# Patient Record
Sex: Male | Born: 1965 | Race: Black or African American | Hispanic: No | Marital: Single | Smoking: Former smoker
Health system: Southern US, Community
[De-identification: ages and names within clinical notes are randomized; demographics above are authoritative.]

## PROBLEM LIST (undated history)

## (undated) DIAGNOSIS — I350 Nonrheumatic aortic (valve) stenosis: Secondary | ICD-10-CM

## (undated) DIAGNOSIS — I10 Essential (primary) hypertension: Secondary | ICD-10-CM

## (undated) DIAGNOSIS — I050 Rheumatic mitral stenosis: Secondary | ICD-10-CM

## (undated) DIAGNOSIS — Z973 Presence of spectacles and contact lenses: Secondary | ICD-10-CM

## (undated) DIAGNOSIS — Z972 Presence of dental prosthetic device (complete) (partial): Secondary | ICD-10-CM

## (undated) DIAGNOSIS — J189 Pneumonia, unspecified organism: Secondary | ICD-10-CM

## (undated) DIAGNOSIS — I052 Rheumatic mitral stenosis with insufficiency: Secondary | ICD-10-CM

## (undated) DIAGNOSIS — I340 Nonrheumatic mitral (valve) insufficiency: Secondary | ICD-10-CM

## (undated) HISTORY — DX: Nonrheumatic aortic (valve) stenosis: I35.0

## (undated) HISTORY — DX: Rheumatic mitral stenosis: I05.0

## (undated) HISTORY — DX: Essential (primary) hypertension: I10

## (undated) HISTORY — PX: BILATERAL KNEE ARTHROSCOPY: SUR91

## (undated) HISTORY — DX: Rheumatic mitral stenosis with insufficiency: I05.2

## (undated) HISTORY — DX: Nonrheumatic mitral (valve) insufficiency: I34.0

---

## 1999-11-30 ENCOUNTER — Emergency Department (HOSPITAL_COMMUNITY): Admission: EM | Admit: 1999-11-30 | Discharge: 1999-11-30 | Payer: Self-pay | Admitting: Emergency Medicine

## 2000-01-04 ENCOUNTER — Emergency Department (HOSPITAL_COMMUNITY): Admission: EM | Admit: 2000-01-04 | Discharge: 2000-01-05 | Payer: Self-pay | Admitting: Emergency Medicine

## 2004-07-24 ENCOUNTER — Emergency Department (HOSPITAL_COMMUNITY): Admission: EM | Admit: 2004-07-24 | Discharge: 2004-07-24 | Payer: Self-pay | Admitting: *Deleted

## 2005-06-01 ENCOUNTER — Ambulatory Visit: Payer: Self-pay | Admitting: Oncology

## 2005-11-29 ENCOUNTER — Ambulatory Visit: Payer: Self-pay | Admitting: Oncology

## 2006-04-03 ENCOUNTER — Ambulatory Visit: Payer: Self-pay | Admitting: Oncology

## 2006-04-05 LAB — IRON AND TIBC
%SAT: 14 % — ABNORMAL LOW (ref 20–55)
Iron: 46 ug/dL (ref 42–165)
TIBC: 318 ug/dL (ref 215–435)
UIBC: 272 ug/dL

## 2006-04-05 LAB — CBC WITH DIFFERENTIAL/PLATELET
BASO%: 0.8 % (ref 0.0–2.0)
Basophils Absolute: 0.1 10*3/uL (ref 0.0–0.1)
EOS%: 2.7 % (ref 0.0–7.0)
Eosinophils Absolute: 0.3 10*3/uL (ref 0.0–0.5)
HCT: 38.6 % — ABNORMAL LOW (ref 38.7–49.9)
HGB: 13 g/dL (ref 13.0–17.1)
LYMPH%: 32.2 % (ref 14.0–48.0)
MCH: 30.1 pg (ref 28.0–33.4)
MCHC: 33.6 g/dL (ref 32.0–35.9)
MCV: 89.5 fL (ref 81.6–98.0)
MONO#: 1.1 10*3/uL — ABNORMAL HIGH (ref 0.1–0.9)
MONO%: 9.7 % (ref 0.0–13.0)
NEUT#: 6.2 10*3/uL (ref 1.5–6.5)
NEUT%: 54.6 % (ref 40.0–75.0)
Platelets: 222 10*3/uL (ref 145–400)
RBC: 4.32 10*6/uL (ref 4.20–5.71)
RDW: 12 % (ref 11.2–14.6)
WBC: 11.3 10*3/uL — ABNORMAL HIGH (ref 4.0–10.0)
lymph#: 3.6 10*3/uL — ABNORMAL HIGH (ref 0.9–3.3)

## 2006-04-05 LAB — FERRITIN: Ferritin: 46 ng/mL (ref 22–322)

## 2006-09-18 ENCOUNTER — Ambulatory Visit: Payer: Self-pay | Admitting: Oncology

## 2008-12-16 ENCOUNTER — Encounter: Admission: RE | Admit: 2008-12-16 | Discharge: 2008-12-22 | Payer: Self-pay | Admitting: Family Medicine

## 2013-03-11 ENCOUNTER — Ambulatory Visit: Payer: No Typology Code available for payment source | Attending: Family Medicine | Admitting: Family Medicine

## 2013-03-11 ENCOUNTER — Encounter: Payer: Self-pay | Admitting: Family Medicine

## 2013-03-11 VITALS — BP 207/136 | HR 84 | Temp 97.9°F | Resp 18 | Wt 168.8 lb

## 2013-03-11 DIAGNOSIS — F17200 Nicotine dependence, unspecified, uncomplicated: Secondary | ICD-10-CM

## 2013-03-11 DIAGNOSIS — K0530 Chronic periodontitis, unspecified: Secondary | ICD-10-CM | POA: Insufficient documentation

## 2013-03-11 DIAGNOSIS — Z823 Family history of stroke: Secondary | ICD-10-CM

## 2013-03-11 DIAGNOSIS — I10 Essential (primary) hypertension: Secondary | ICD-10-CM

## 2013-03-11 DIAGNOSIS — K029 Dental caries, unspecified: Secondary | ICD-10-CM

## 2013-03-11 LAB — COMPREHENSIVE METABOLIC PANEL
ALT: 12 U/L (ref 0–53)
AST: 17 U/L (ref 0–37)
Albumin: 4 g/dL (ref 3.5–5.2)
Alkaline Phosphatase: 81 U/L (ref 39–117)
BUN: 12 mg/dL (ref 6–23)
CO2: 23 mEq/L (ref 19–32)
Calcium: 9.7 mg/dL (ref 8.4–10.5)
Chloride: 104 mEq/L (ref 96–112)
Creat: 0.97 mg/dL (ref 0.50–1.35)
Glucose, Bld: 89 mg/dL (ref 70–99)
Potassium: 4 mEq/L (ref 3.5–5.3)
Sodium: 138 mEq/L (ref 135–145)
Total Bilirubin: 0.4 mg/dL (ref 0.3–1.2)
Total Protein: 7.9 g/dL (ref 6.0–8.3)

## 2013-03-11 LAB — HEMOGLOBIN A1C
Hgb A1c MFr Bld: 4.9 % (ref <5.7)
Mean Plasma Glucose: 94 mg/dL (ref <117)

## 2013-03-11 LAB — TSH: TSH: 0.747 u[IU]/mL (ref 0.350–4.500)

## 2013-03-11 LAB — LIPID PANEL
Cholesterol: 137 mg/dL (ref 0–200)
HDL: 42 mg/dL (ref >39)
LDL Cholesterol: 74 mg/dL (ref 0–99)
Total CHOL/HDL Ratio: 3.3 Ratio
Triglycerides: 107 mg/dL (ref <150)
VLDL: 21 mg/dL (ref 0–40)

## 2013-03-11 MED ORDER — ATENOLOL 25 MG PO TABS: 25.0000 mg | ORAL_TABLET | Freq: Every day | ORAL | Status: DC

## 2013-03-11 MED ORDER — LISINOPRIL-HYDROCHLOROTHIAZIDE 20-12.5 MG PO TABS: 1.0000 | ORAL_TABLET | Freq: Every day | ORAL | Status: DC

## 2013-03-11 NOTE — Patient Instructions (Signed)
Smoking Cessation, Tips for Success YOU CAN QUIT SMOKING If you are ready to quit smoking, congratulations! You have chosen to help yourself be healthier. Cigarettes bring nicotine, tar, carbon monoxide, and other irritants into your body. Your lungs, heart, and blood vessels will be able to work better without these poisons. There are many different ways to quit smoking. Nicotine gum, nicotine patches, a nicotine inhaler, or nicotine nasal spray can help with physical craving. Hypnosis, support groups, and medicines help break the habit of smoking. Here are some tips to help you quit for good.  Throw away all cigarettes.  Clean and remove all ashtrays from your home, work, and car.  On a card, write down your reasons for quitting. Carry the card with you and read it when you get the urge to smoke.  Cleanse your body of nicotine. Drink enough water and fluids to keep your urine clear or pale yellow. Do this after quitting to flush the nicotine from your body.  Learn to predict your moods. Do not let a bad situation be your excuse to have a cigarette. Some situations in your life might tempt you into wanting a cigarette.  Never have "just one" cigarette. It leads to wanting another and another. Remind yourself of your decision to quit.  Change habits associated with smoking. If you smoked while driving or when feeling stressed, try other activities to replace smoking. Stand up when drinking your coffee. Brush your teeth after eating. Sit in a different chair when you read the paper. Avoid alcohol while trying to quit, and try to drink fewer caffeinated beverages. Alcohol and caffeine may urge you to smoke.  Avoid foods and drinks that can trigger a desire to smoke, such as sugary or spicy foods and alcohol.  Ask people who smoke not to smoke around you.  Have something planned to do right after eating or having a cup of coffee. Take a walk or exercise to perk you up. This will help to keep you  from overeating.  Try a relaxation exercise to calm you down and decrease your stress. Remember, you may be tense and nervous for the first 2 weeks after you quit, but this will pass.  Find new activities to keep your hands busy. Play with a pen, coin, or rubber band. Doodle or draw things on paper.  Brush your teeth right after eating. This will help cut down on the craving for the taste of tobacco after meals. You can try mouthwash, too.  Use oral substitutes, such as lemon drops, carrots, a cinnamon stick, or chewing gum, in place of cigarettes. Keep them handy so they are available when you have the urge to smoke.  When you have the urge to smoke, try deep breathing.  Designate your home as a nonsmoking area.  If you are a heavy smoker, ask your caregiver about a prescription for nicotine chewing gum. It can ease your withdrawal from nicotine.  Reward yourself. Set aside the cigarette money you save and buy yourself something nice.  Look for support from others. Join a support group or smoking cessation program. Ask someone at home or at work to help you with your plan to quit smoking.  Always ask yourself, "Do I need this cigarette or is this just a reflex?" Tell yourself, "Today, I choose not to smoke," or "I do not want to smoke." You are reminding yourself of your decision to quit, even if you do smoke a cigarette. HOW WILL I FEEL WHEN   I QUIT SMOKING?  The benefits of not smoking start within days of quitting.  You may have symptoms of withdrawal because your body is used to nicotine (the addictive substance in cigarettes). You may crave cigarettes, be irritable, feel very hungry, cough often, get headaches, or have difficulty concentrating.  The withdrawal symptoms are only temporary. They are strongest when you first quit but will go away within 10 to 14 days.  When withdrawal symptoms occur, stay in control. Think about your reasons for quitting. Remind yourself that these are  signs that your body is healing and getting used to being without cigarettes.  Remember that withdrawal symptoms are easier to treat than the major diseases that smoking can cause.  Even after the withdrawal is over, expect periodic urges to smoke. However, these cravings are generally short-lived and will go away whether you smoke or not. Do not smoke!  If you relapse and smoke again, do not lose hope. Most smokers quit 3 times before they are successful.  If you relapse, do not give up! Plan ahead and think about what you will do the next time you get the urge to smoke. LIFE AS A NONSMOKER: MAKE IT FOR A MONTH, MAKE IT FOR LIFE Day 1: Hang this page where you will see it every day. Day 2: Get rid of all ashtrays, matches, and lighters. Day 3: Drink water. Breathe deeply between sips. Day 4: Avoid places with smoke-filled air, such as bars, clubs, or the smoking section of restaurants. Day 5: Keep track of how much money you save by not smoking. Day 6: Avoid boredom. Keep a good book with you or go to the movies. Day 7: Reward yourself! One week without smoking! Day 8: Make a dental appointment to get your teeth cleaned. Day 9: Decide how you will turn down a cigarette before it is offered to you. Day 10: Review your reasons for quitting. Day 11: Distract yourself. Stay active to keep your mind off smoking and to relieve tension. Take a walk, exercise, read a book, do a crossword puzzle, or try a new hobby. Day 12: Exercise. Get off the bus before your stop or use stairs instead of escalators. Day 13: Call on friends for support and encouragement. Day 14: Reward yourself! Two weeks without smoking! Day 15: Practice deep breathing exercises. Day 16: Bet a friend that you can stay a nonsmoker. Day 17: Ask to sit in nonsmoking sections of restaurants. Day 18: Hang up "No Smoking" signs. Day 19: Think of yourself as a nonsmoker. Day 20: Each morning, tell yourself you will not smoke. Day  21: Reward yourself! Three weeks without smoking! Day 22: Think of smoking in negative ways. Remember how it stains your teeth, gives you bad breath, and leaves you short of breath. Day 23: Eat a nutritious breakfast. Day 24:Do not relive your days as a smoker. Day 25: Hold a pencil in your hand when talking on the telephone. Day 26: Tell all your friends you do not smoke. Day 27: Think about how much better food tastes. Day 28: Remember, one cigarette is one too many. Day 29: Take up a hobby that will keep your hands busy. Day 30: Congratulations! One month without smoking! Give yourself a big reward. Your caregiver can direct you to community resources or hospitals for support, which may include:  Group support.  Education.  Hypnosis.  Subliminal therapy. Document Released: 06/24/2004 Document Revised: 12/19/2011 Document Reviewed: 07/13/2009 [facility] Patient Information 2014 [place], [place]. Smoking  Cessation, Tips for Success YOU CAN QUIT SMOKING If you are ready to quit smoking, congratulations! You have chosen to help yourself be healthier. Cigarettes bring nicotine, tar, carbon monoxide, and other irritants into your body. Your lungs, heart, and blood vessels will be able to work better without these poisons. There are many different ways to quit smoking. Nicotine gum, nicotine patches, a nicotine inhaler, or nicotine nasal spray can help with physical craving. Hypnosis, support groups, and medicines help break the habit of smoking. Here are some tips to help you quit for good.  Throw away all cigarettes.  Clean and remove all ashtrays from your home, work, and car.  On a card, write down your reasons for quitting. Carry the card with you and read it when you get the urge to smoke.  Cleanse your body of nicotine. Drink enough water and fluids to keep your urine clear or pale yellow. Do this after quitting to flush the nicotine from your body.  Learn to predict your moods. Do  not let a bad situation be your excuse to have a cigarette. Some situations in your life might tempt you into wanting a cigarette.  Never have "just one" cigarette. It leads to wanting another and another. Remind yourself of your decision to quit.  Change habits associated with smoking. If you smoked while driving or when feeling stressed, try other activities to replace smoking. Stand up when drinking your coffee. Brush your teeth after eating. Sit in a different chair when you read the paper. Avoid alcohol while trying to quit, and try to drink fewer caffeinated beverages. Alcohol and caffeine may urge you to smoke.  Avoid foods and drinks that can trigger a desire to smoke, such as sugary or spicy foods and alcohol.  Ask people who smoke not to smoke around you.  Have something planned to do right after eating or having a cup of coffee. Take a walk or exercise to perk you up. This will help to keep you from overeating.  Try a relaxation exercise to calm you down and decrease your stress. Remember, you may be tense and nervous for the first 2 weeks after you quit, but this will pass.  Find new activities to keep your hands busy. Play with a pen, coin, or rubber band. Doodle or draw things on paper.  Brush your teeth right after eating. This will help cut down on the craving for the taste of tobacco after meals. You can try mouthwash, too.  Use oral substitutes, such as lemon drops, carrots, a cinnamon stick, or chewing gum, in place of cigarettes. Keep them handy so they are available when you have the urge to smoke.  When you have the urge to smoke, try deep breathing.  Designate your home as a nonsmoking area.  If you are a heavy smoker, ask your caregiver about a prescription for nicotine chewing gum. It can ease your withdrawal from nicotine.  Reward yourself. Set aside the cigarette money you save and buy yourself something nice.  Look for support from others. Join a support group  or smoking cessation program. Ask someone at home or at work to help you with your plan to quit smoking.  Always ask yourself, "Do I need this cigarette or is this just a reflex?" Tell yourself, "Today, I choose not to smoke," or "I do not want to smoke." You are reminding yourself of your decision to quit, even if you do smoke a cigarette. HOW WILL I FEEL WHEN  I QUIT SMOKING?  The benefits of not smoking start within days of quitting.  You may have symptoms of withdrawal because your body is used to nicotine (the addictive substance in cigarettes). You may crave cigarettes, be irritable, feel very hungry, cough often, get headaches, or have difficulty concentrating.  The withdrawal symptoms are only temporary. They are strongest when you first quit but will go away within 10 to 14 days.  When withdrawal symptoms occur, stay in control. Think about your reasons for quitting. Remind yourself that these are signs that your body is healing and getting used to being without cigarettes.  Remember that withdrawal symptoms are easier to treat than the major diseases that smoking can cause.  Even after the withdrawal is over, expect periodic urges to smoke. However, these cravings are generally short-lived and will go away whether you smoke or not. Do not smoke!  If you relapse and smoke again, do not lose hope. Most smokers quit 3 times before they are successful.  If you relapse, do not give up! Plan ahead and think about what you will do the next time you get the urge to smoke. LIFE AS A NONSMOKER: MAKE IT FOR A MONTH, MAKE IT FOR LIFE Day 1: Hang this page where you will see it every day. Day 2: Get rid of all ashtrays, matches, and lighters. Day 3: Drink water. Breathe deeply between sips. Day 4: Avoid places with smoke-filled air, such as bars, clubs, or the smoking section of restaurants. Day 5: Keep track of how much money you save by not smoking. Day 6: Avoid boredom. Keep a good book with  you or go to the movies. Day 7: Reward yourself! One week without smoking! Day 8: Make a dental appointment to get your teeth cleaned. Day 9: Decide how you will turn down a cigarette before it is offered to you. Day 10: Review your reasons for quitting. Day 11: Distract yourself. Stay active to keep your mind off smoking and to relieve tension. Take a walk, exercise, read a book, do a crossword puzzle, or try a new hobby. Day 12: Exercise. Get off the bus before your stop or use stairs instead of escalators. Day 13: Call on friends for support and encouragement. Day 14: Reward yourself! Two weeks without smoking! Day 15: Practice deep breathing exercises. Day 16: Bet a friend that you can stay a nonsmoker. Day 17: Ask to sit in nonsmoking sections of restaurants. Day 18: Hang up "No Smoking" signs. Day 19: Think of yourself as a nonsmoker. Day 20: Each morning, tell yourself you will not smoke. Day 21: Reward yourself! Three weeks without smoking! Day 22: Think of smoking in negative ways. Remember how it stains your teeth, gives you bad breath, and leaves you short of breath. Day 23: Eat a nutritious breakfast. Day 24:Do not relive your days as a smoker. Day 25: Hold a pencil in your hand when talking on the telephone. Day 26: Tell all your friends you do not smoke. Day 27: Think about how much better food tastes. Day 28: Remember, one cigarette is one too many. Day 29: Take up a hobby that will keep your hands busy. Day 30: Congratulations! One month without smoking! Give yourself a big reward. Your caregiver can direct you to community resources or hospitals for support, which may include:  Group support.  Education.  Hypnosis.  Subliminal therapy. Document Released: 06/24/2004 Document Revised: 12/19/2011 Document Reviewed: 07/13/2009 [facility] Patient Information 2014 [place], [place]. DASH Diet  The DASH diet stands for "Dietary Approaches to Stop Hypertension." It is a  healthy eating plan that has been shown to reduce high blood pressure (hypertension) in as little as 14 days, while also possibly providing other significant health benefits. These other health benefits include reducing the risk of breast cancer after menopause and reducing the risk of type 2 diabetes, heart disease, colon cancer, and stroke. Health benefits also include weight loss and slowing kidney failure in patients with chronic kidney disease.  DIET GUIDELINES  Limit salt (sodium). Your diet should contain less than 1500 mg of sodium daily.  Limit refined or processed carbohydrates. Your diet should include mostly whole grains. Desserts and added sugars should be used sparingly.  Include small amounts of heart-healthy fats. These types of fats include nuts, oils, and tub margarine. Limit saturated and trans fats. These fats have been shown to be harmful in the body. CHOOSING FOODS  The following food groups are based on a 2000 calorie diet. See your Registered Dietitian for individual calorie needs. Grains and Grain Products (6 to 8 servings daily)  Eat More Often: Whole-wheat bread, brown rice, whole-grain or wheat pasta, quinoa, popcorn without added fat or salt (air popped).  Eat Less Often: White bread, white pasta, white rice, cornbread. Vegetables (4 to 5 servings daily)  Eat More Often: Fresh, frozen, and canned vegetables. Vegetables may be raw, steamed, roasted, or grilled with a minimal amount of fat.  Eat Less Often/Avoid: Creamed or fried vegetables. Vegetables in a cheese sauce. Fruit (4 to 5 servings daily)  Eat More Often: All fresh, canned (in natural juice), or frozen fruits. Dried fruits without added sugar. One hundred percent fruit juice ( cup [237 mL] daily).  Eat Less Often: Dried fruits with added sugar. Canned fruit in light or heavy syrup. [employer], Fish, and Poultry (2 servings or less daily. One serving is 3 to 4 oz [85-114 g]).  Eat More Often: Ninety  percent or leaner ground beef, tenderloin, sirloin. Round cuts of beef, chicken breast, [place] breast. All fish. Grill, bake, or broil your meat. Nothing should be fried.  Eat Less Often/Avoid: Fatty cuts of meat, [place], or chicken leg, thigh, or wing. Fried cuts of meat or fish. Dairy (2 to 3 servings)  Eat More Often: Low-fat or fat-free milk, low-fat plain or light yogurt, reduced-fat or part-skim cheese.  Eat Less Often/Avoid: Milk (whole, 2%).Whole milk yogurt. Full-fat cheeses. Nuts, Seeds, and Legumes (4 to 5 servings per week)  Eat More Often: All without added salt.  Eat Less Often/Avoid: Salted nuts and seeds, canned beans with added salt. Fats and Sweets (limited)  Eat More Often: Vegetable oils, tub margarines without trans fats, sugar-free gelatin. Mayonnaise and salad dressings.  Eat Less Often/Avoid: Coconut oils, palm oils, butter, stick margarine, cream, half and half, cookies, candy, pie. FOR MORE INFORMATION The Dash Diet Eating Plan: www.dashdiet.org Document Released: 09/15/2011 Document Revised: 12/19/2011 Document Reviewed: 09/15/2011 [facility] Patient Information 2014 [place], [place]. Managing Your High Blood Pressure Blood pressure is a measurement of how forceful your blood is pressing against the walls of the arteries. Arteries are muscular tubes within the circulatory system. Blood pressure does not stay the same. Blood pressure rises when you are active, excited, or nervous; and it lowers during sleep and relaxation. If the numbers measuring your blood pressure stay above normal most of the time, you are at risk for health problems. High blood pressure (hypertension) is a long-term (chronic) condition in which blood pressure  is elevated. A blood pressure reading is recorded as two numbers, such as 120 over 80 (or 120/80). The first, higher number is called the systolic pressure. It is a measure of the pressure in your arteries as the heart beats. The second,  lower number is called the diastolic pressure. It is a measure of the pressure in your arteries as the heart relaxes between beats.  Keeping your blood pressure in a normal range is important to your overall health and prevention of health problems, such as heart disease and stroke. When your blood pressure is uncontrolled, your heart has to work harder than normal. High blood pressure is a very common condition in adults because blood pressure tends to rise with age. Men and women are equally likely to have hypertension but at different times in life. Before age 32, men are more likely to have hypertension. After 49 years of age, women are more likely to have it. Hypertension is especially common in African Americans. This condition often has no signs or symptoms. The cause of the condition is usually not known. Your caregiver can help you come up with a plan to keep your blood pressure in a normal, healthy range. BLOOD PRESSURE STAGES Blood pressure is classified into four stages: normal, prehypertension, stage 1, and stage 2. Your blood pressure reading will be used to determine what type of treatment, if any, is necessary. Appropriate treatment options are tied to these four stages:  Normal  Systolic pressure (mm Hg): below 120.  Diastolic pressure (mm Hg): below 80. Prehypertension  Systolic pressure (mm Hg): 120 to 139.  Diastolic pressure (mm Hg): 80 to 89. Stage1  Systolic pressure (mm Hg): 140 to 159.  Diastolic pressure (mm Hg): 90 to 99. Stage2  Systolic pressure (mm Hg): 160 or above.  Diastolic pressure (mm Hg): 100 or above. RISKS RELATED TO HIGH BLOOD PRESSURE Managing your blood pressure is an important responsibility. Uncontrolled high blood pressure can lead to:  A heart attack.  A stroke.  A weakened blood vessel (aneurysm).  Heart failure.  Kidney damage.  Eye damage.  Metabolic syndrome.  Memory and concentration problems. HOW TO MANAGE YOUR BLOOD  PRESSURE Blood pressure can be managed effectively with lifestyle changes and medicines (if needed). Your caregiver will help you come up with a plan to bring your blood pressure within a normal range. Your plan should include the following: Education  Read all information provided by your caregivers about how to control blood pressure.  Educate yourself on the latest guidelines and treatment recommendations. New research is always being done to further define the risks and treatments for high blood pressure. Lifestylechanges  Control your weight.  Avoid smoking.  Stay physically active.  Reduce the amount of salt in your diet.  Reduce stress.  Control any chronic conditions, such as high cholesterol or diabetes.  Reduce your alcohol intake. Medicines  Several medicines (antihypertensive medicines) are available, if needed, to bring blood pressure within a normal range. Communication  Review all the medicines you take with your caregiver because there may be side effects or interactions.  Talk with your caregiver about your diet, exercise habits, and other lifestyle factors that may be contributing to high blood pressure.  See your caregiver regularly. Your caregiver can help you create and adjust your plan for managing high blood pressure. RECOMMENDATIONS FOR TREATMENT AND FOLLOW-UP  The following recommendations are based on current guidelines for managing high blood pressure in nonpregnant adults. Use these recommendations to identify  the proper follow-up period or treatment option based on your blood pressure reading. You can discuss these options with your caregiver.  Systolic pressure of 120 to 139 or diastolic pressure of 80 to 89: Follow up with your caregiver as directed.  Systolic pressure of 140 to 160 or diastolic pressure of 90 to 100: Follow up with your caregiver within 2 months.  Systolic pressure above 160 or diastolic pressure above 100: Follow up with your  caregiver within 1 month.  Systolic pressure above 180 or diastolic pressure above 110: Consider antihypertensive therapy; follow up with your caregiver within 1 week.  Systolic pressure above 200 or diastolic pressure above 120: Begin antihypertensive therapy; follow up with your caregiver within 1 week. Document Released: 06/20/2012 Document Reviewed: 06/20/2012 [facility] Patient Information 2014 [place], [place]. Hypertension As your heart beats, it forces blood through your arteries. This force is your blood pressure. If the pressure is too high, it is called hypertension (HTN) or high blood pressure. HTN is dangerous because you may have it and not know it. High blood pressure may mean that your heart has to work harder to pump blood. Your arteries may be narrow or stiff. The extra work puts you at risk for heart disease, stroke, and other problems.  Blood pressure consists of two numbers, a higher number over a lower, 110/72, for example. It is stated as "110 over 72." The ideal is below 120 for the top number (systolic) and under 80 for the bottom (diastolic). Write down your blood pressure today. You should pay close attention to your blood pressure if you have certain conditions such as:  Heart failure.  Prior heart attack.  Diabetes  Chronic kidney disease.  Prior stroke.  Multiple risk factors for heart disease. To see if you have HTN, your blood pressure should be measured while you are seated with your arm held at the level of the heart. It should be measured at least twice. A one-time elevated blood pressure reading (especially in the Emergency Department) does not mean that you need treatment. There may be conditions in which the blood pressure is different between your right and left arms. It is important to see your caregiver soon for a recheck. Most people have essential hypertension which means that there is not a specific cause. This type of high blood pressure may be  lowered by changing lifestyle factors such as:  Stress.  Smoking.  Lack of exercise.  Excessive weight.  Drug/tobacco/alcohol use.  Eating less salt. Most people do not have symptoms from high blood pressure until it has caused damage to the body. Effective treatment can often prevent, delay or reduce that damage. TREATMENT  When a cause has been identified, treatment for high blood pressure is directed at the cause. There are a large number of medications to treat HTN. These fall into several categories, and your caregiver will help you select the medicines that are best for you. Medications may have side effects. You should review side effects with your caregiver. If your blood pressure stays high after you have made lifestyle changes or started on medicines,   Your medication(s) may need to be changed.  Other problems may need to be addressed.  Be certain you understand your prescriptions, and know how and when to take your medicine.  Be sure to follow up with your caregiver within the time frame advised (usually within two weeks) to have your blood pressure rechecked and to review your medications.  If you are taking  more than one medicine to lower your blood pressure, make sure you know how and at what times they should be taken. Taking two medicines at the same time can result in blood pressure that is too low. SEEK IMMEDIATE MEDICAL CARE IF:  You develop a severe headache, blurred or changing vision, or confusion.  You have unusual weakness or numbness, or a faint feeling.  You have severe chest or abdominal pain, vomiting, or breathing problems. MAKE SURE YOU:   Understand these instructions.  Will watch your condition.  Will get help right away if you are not doing well or get worse. Document Released: 09/26/2005 Document Revised: 12/19/2011 Document Reviewed: 05/16/2008 [facility] Patient Information 2014 [place], [place].

## 2013-03-11 NOTE — Progress Notes (Signed)
Patient ID: Melvin Burns, male   DOB: [birth year], 47 y.o.   MRN: [id number]  CC: elevated BP  HPI: Pt was seen at the dentist on Friday and was told that his blood pressure was elevated and that he needed to be seen.  Pt has never been diagnosed with HTN and never seen for this.  Pt's BP was 220/118 when he was seen by dentist.   Pt has not been sleeping well but otherwise has had no symptoms.  No chest pain, No SOB. No fever or chills.  Pt says his dad had Htn and had a stroke and this concerns the patient very much.    No Known Allergies No past medical history on file. No current outpatient prescriptions on file prior to visit.   No current facility-administered medications on file prior to visit.   No family history on file. History   Social History  . Marital Status: Married    Spouse Name: N/A    Number of Children: N/A  . Years of Education: N/A   Occupational History  . Not on file.   Social History Main Topics  . Smoking status: Not on file  . Smokeless tobacco: Not on file  . Alcohol Use: Not on file  . Drug Use: Not on file  . Sexually Active: Not on file   Other Topics Concern  . Not on file   Social History Narrative  . No narrative on file    Review of Systems  Constitutional: Negative for fever, chills, diaphoresis, activity change, appetite change and fatigue.  HENT: Negative for ear pain, nosebleeds, congestion, facial swelling, rhinorrhea, neck pain, neck stiffness and ear discharge.   Eyes: Negative for pain, discharge, redness, itching and visual disturbance.  Respiratory: Negative for cough, choking, chest tightness, shortness of breath, wheezing and stridor.   Cardiovascular: Negative for chest pain, palpitations and leg swelling.  Gastrointestinal: Negative for abdominal distention.  Genitourinary: Negative for dysuria, urgency, frequency, hematuria, flank pain, decreased urine volume, difficulty urinating and dyspareunia.  Musculoskeletal:  Negative for back pain, joint swelling, arthralgias and gait problem.  Neurological: Negative for dizziness, tremors, seizures, syncope, facial asymmetry, speech difficulty, weakness, light-headedness, numbness and headaches.  Hematological: Negative for adenopathy. Does not bruise/bleed easily.  Psychiatric/Behavioral: Negative for hallucinations, behavioral problems, confusion, dysphoric mood, decreased concentration and agitation.    Objective:   Filed Vitals:   03/11/13 1108  BP: 207/136  Pulse: 84  Temp: 97.9 F (36.6 C)  Resp: 18    Physical Exam  Constitutional: Appears well-developed and well-nourished. No distress.  HENT: Normocephalic. External right and left ear normal. Oropharynx is clear and moist. Dental Caries  Eyes: Conjunctivae and EOM are normal. PERRLA, no scleral icterus.  Neck: Normal ROM. Neck supple. No JVD. No tracheal deviation. No thyromegaly.  CVS: RRR, S1/S2 +, no murmurs, no gallops, no carotid bruit.  Pulmonary: Effort and breath sounds normal, no stridor, rhonchi, wheezes, rales.  Abdominal: Soft. BS +,  no distension, tenderness, rebound or guarding.  Musculoskeletal: Normal range of motion. No edema and no tenderness.  Lymphadenopathy: No lymphadenopathy noted, cervical, inguinal. Neuro: Alert. Normal reflexes, muscle tone coordination. No cranial nerve deficit. Skin: Skin is warm and dry. No rash noted. Not diaphoretic. No erythema. No pallor.  Psychiatric: Normal mood and affect. Behavior, judgment, thought content normal.   Lab Results  Component Value Date   WBC 11.3* 04/05/2006   HGB 13.0 04/05/2006   HCT 38.6* 04/05/2006   MCV 89.5 04/05/2006  PLT 222 04/05/2006   No results found for this basename: CREATININE, BUN, NA, K, CL, CO2    No results found for this basename: HGBA1C   Lipid Panel  No results found for this basename: chol, trig, hdl, cholhdl, vldl, ldlcalc      Assessment and plan:    Accelerated hypertension - Plan:  Comprehensive metabolic panel, Lipid Panel, TSH, Urinalysis, Routine w reflex microscopic, Vitamin D, 25-hydroxy, Hemoglobin A1c, EKG 12-Lead  Family history of stroke - Plan: Comprehensive metabolic panel, Lipid Panel, TSH, Urinalysis, Routine w reflex microscopic, Vitamin D, 25-hydroxy, Hemoglobin A1c, EKG 12-Lead  Uncontrolled hypertension - Plan: EKG 12-Lead  Smoker - Plan: EKG 12-Lead  Dental caries  Chronic periodontitis, unspecified    Hypertension, uncontrolled Family History of Stroke The patient was counseled on the dangers of tobacco use, and was advised to quit.  Reviewed strategies to maximize success, including removing cigarettes and smoking materials from environment and stress management.  The patient was given clear instructions to go to ER or return to medical center if symptoms don't improve, worsen or new problems develop.  The patient verbalized understanding.  The patient was told to call to get lab results if they haven't heard anything in the next week.    Follow up in 1 week   Rodney Langton, MD, CDE, FAAFP [facility] [place], [place]

## 2013-03-11 NOTE — Progress Notes (Signed)
Patient states was seen at a recent dental clinic His blood pressure was elevated there  They suggested he follow up with [place] Today his blood pressure is elevated as well 207/136

## 2013-03-12 LAB — URINALYSIS, ROUTINE W REFLEX MICROSCOPIC
Bilirubin Urine: NEGATIVE
Glucose, UA: NEGATIVE mg/dL
Hgb urine dipstick: NEGATIVE
Ketones, ur: NEGATIVE mg/dL
Leukocytes, UA: NEGATIVE
Nitrite: NEGATIVE
Protein, ur: NEGATIVE mg/dL
Specific Gravity, Urine: 1.019 (ref 1.005–1.030)
Urobilinogen, UA: 0.2 mg/dL (ref 0.0–1.0)
pH: 6.5 (ref 5.0–8.0)

## 2013-03-12 LAB — VITAMIN D 25 HYDROXY (VIT D DEFICIENCY, FRACTURES): Vit D, 25-Hydroxy: 14 ng/mL — ABNORMAL LOW (ref 30–89)

## 2013-03-14 ENCOUNTER — Telehealth: Payer: Self-pay | Admitting: *Deleted

## 2013-03-14 NOTE — Telephone Encounter (Signed)
03/14/13 Patient not available message left to call clinic. P.Zeferino Mounts,RN

## 2013-03-14 NOTE — Progress Notes (Signed)
Quick Note:  Please inform patient that labs came back OK except that the vitamin D level is low. Please call in prescription for Drisdol Vitamin D 50,000 IU caps, take 1 po once per week, #8, no refills. Recheck labs in 4 months.   Rodney Langton, MD, CDE, FAAFP [facility] [place], [place]   ______

## 2013-03-15 ENCOUNTER — Telehealth: Payer: Self-pay | Admitting: *Deleted

## 2013-03-15 NOTE — Telephone Encounter (Signed)
03/15/13 Patient made aware of lab results are OK except Vitamin D level low Will call in prescription for Vitamin D 50,000 cap 1 po once pper week #8 No refills. Appointment for 07/15/13 for recheck. P.Abdou Stocks,RN

## 2013-03-19 ENCOUNTER — Ambulatory Visit: Payer: No Typology Code available for payment source | Attending: Family Medicine | Admitting: Family Medicine

## 2013-03-19 VITALS — BP 181/98 | HR 67 | Temp 98.1°F | Resp 18 | Wt 168.9 lb

## 2013-03-19 DIAGNOSIS — I10 Essential (primary) hypertension: Secondary | ICD-10-CM

## 2013-03-19 LAB — BASIC METABOLIC PANEL
BUN: 14 mg/dL (ref 6–23)
CO2: 25 mEq/L (ref 19–32)
Calcium: 9.9 mg/dL (ref 8.4–10.5)
Chloride: 106 mEq/L (ref 96–112)
Creat: 1.38 mg/dL — ABNORMAL HIGH (ref 0.50–1.35)
Glucose, Bld: 131 mg/dL — ABNORMAL HIGH (ref 70–99)
Potassium: 3.7 mEq/L (ref 3.5–5.3)
Sodium: 139 mEq/L (ref 135–145)

## 2013-03-19 NOTE — Patient Instructions (Signed)

## 2013-03-19 NOTE — Progress Notes (Signed)
Patient is here today for a follow up for  Hypertension Today presents with blood pressure 181/98 Patient stated has not taken his medication yet today

## 2013-03-19 NOTE — Progress Notes (Signed)
Subjective:     Patient ID: Melvin Burns, male   DOB: 08-19-1966, 47 y.o.   MRN: [id number]  HPI Pt here for 1 week f/u on HTN. He has been taking zestoretic and atenolol without any side effects. No headaches, chest pain, blurred vision. He did not think he was supposed to take him medication today as he was coming in to see [place]. He does have a BP cuff at home.    Review of Systems per hpi     Objective:   Physical Exam  Nursing note and vitals reviewed. Constitutional: He appears well-developed and well-nourished.  Cardiovascular: Normal rate, regular rhythm and normal heart sounds.   Pulmonary/Chest: Effort normal and breath sounds normal.  Psychiatric: He has a normal mood and affect.       Assessment:     Accelerated hypertension - Plan: Basic Metabolic Panel       Plan:     - cont current meds for now - check bp at home for the next 2 days and call us with the result. Based on these readings we can adjust meds if needed. Room to go up on atenolol, could also start norvasc.  - checking bmp today   - rtc 1 month, call with questions or concerns before then

## 2013-03-20 ENCOUNTER — Telehealth: Payer: Self-pay | Admitting: *Deleted

## 2013-03-20 NOTE — Telephone Encounter (Signed)
03/20/13 Patient made aware of lab results  Kidney lab number slightly elevated. Patient informed to keep hydrated well and recheck lab in 1 month Appt. Schedule for July 10.2014 lab only. P.Joshuah Minella, RN BSN [facility]

## 2013-04-18 ENCOUNTER — Ambulatory Visit: Payer: No Typology Code available for payment source | Attending: Family Medicine

## 2013-04-18 DIAGNOSIS — I10 Essential (primary) hypertension: Secondary | ICD-10-CM | POA: Insufficient documentation

## 2013-04-18 LAB — BASIC METABOLIC PANEL
BUN: 17 mg/dL (ref 6–23)
CO2: 25 mEq/L (ref 19–32)
Calcium: 9.6 mg/dL (ref 8.4–10.5)
Chloride: 103 mEq/L (ref 96–112)
Creat: 1.09 mg/dL (ref 0.50–1.35)
Glucose, Bld: 102 mg/dL — ABNORMAL HIGH (ref 70–99)
Potassium: 3.8 mEq/L (ref 3.5–5.3)
Sodium: 130 mEq/L — ABNORMAL LOW (ref 135–145)

## 2013-04-23 ENCOUNTER — Telehealth: Payer: Self-pay

## 2013-04-23 NOTE — Progress Notes (Signed)
Quick Note:  Please inform patient that his labs show that his kidney function has returned to normal range but his sodium level came back low. We may need to make some adjustments to his blood pressure medication. Recommend restrict fluids to 1.5 L per day for the next 6 days and have a repeat BMP tested on Monday. He also needs a followup appointment office visit for his blood pressure followup.   Rodney Langton, MD, CDE, FAAFP [facility] [place], [place]   ______

## 2013-04-23 NOTE — Telephone Encounter (Signed)
Message copied by Lestine Mount on Tue Apr 23, 2013  8:44 AM ------      Message from: Cleora Fleet      Created: Tue Apr 23, 2013  8:13 AM       Please inform patient that his labs show that his kidney function has returned to normal range but his sodium level came back low.  We may need to make some adjustments to his blood pressure medication.  Recommend restrict fluids to 1.5 L per day for the next 6 days and have a repeat BMP tested on Monday.  He also needs a followup appointment office visit for his blood pressure followup.              Rodney Langton, MD, CDE, FAAFP      [facility]      [facility]      [place], [place]        ------

## 2013-05-01 ENCOUNTER — Encounter: Payer: Self-pay | Admitting: Internal Medicine

## 2013-05-01 ENCOUNTER — Ambulatory Visit: Payer: No Typology Code available for payment source | Attending: Family Medicine | Admitting: Internal Medicine

## 2013-05-01 VITALS — BP 184/105 | HR 61 | Temp 98.1°F | Resp 16 | Ht 67.0 in | Wt 172.0 lb

## 2013-05-01 DIAGNOSIS — I10 Essential (primary) hypertension: Secondary | ICD-10-CM

## 2013-05-01 DIAGNOSIS — K0530 Chronic periodontitis, unspecified: Secondary | ICD-10-CM | POA: Insufficient documentation

## 2013-05-01 DIAGNOSIS — K029 Dental caries, unspecified: Secondary | ICD-10-CM | POA: Insufficient documentation

## 2013-05-01 DIAGNOSIS — Z823 Family history of stroke: Secondary | ICD-10-CM | POA: Insufficient documentation

## 2013-05-01 DIAGNOSIS — E871 Hypo-osmolality and hyponatremia: Secondary | ICD-10-CM | POA: Insufficient documentation

## 2013-05-01 DIAGNOSIS — Z79899 Other long term (current) drug therapy: Secondary | ICD-10-CM | POA: Insufficient documentation

## 2013-05-01 DIAGNOSIS — F17200 Nicotine dependence, unspecified, uncomplicated: Secondary | ICD-10-CM | POA: Insufficient documentation

## 2013-05-01 MED ORDER — ATENOLOL 25 MG PO TABS: 25.0000 mg | ORAL_TABLET | Freq: Every day | ORAL | Status: DC

## 2013-05-01 MED ORDER — LISINOPRIL 20 MG PO TABS: 20.0000 mg | ORAL_TABLET | Freq: Every day | ORAL | Status: DC

## 2013-05-01 NOTE — Progress Notes (Signed)
Patient ID: Melvin Burns, male   DOB: [birth year], 47 y.o.   MRN: [id number]   HPI: This is a 11 male who presents for refills on BP meds- ran out 2 days ago.   No Known Allergies Past Medical History  Diagnosis Date  . Hypertension    Prior to Admission medications   Medication Sig Start Date End Date Taking? Authorizing Provider  atenolol (TENORMIN) 25 MG tablet Take 1 tablet (25 mg total) by mouth daily. 05/01/13   Calvert Cantor, MD  Lisinopril/HCTZ 20/12.5- 1 daily  05/01/13   Calvert Cantor, MD   Family History  Problem Relation Age of Onset  . Hypertension Mother   . Hypertension Father   . Hypertension Mother   . Stroke Father    History   Social History  . Marital Status: Married    Spouse Name: N/A    Number of Children: N/A  . Years of Education: N/A   Occupational History  . [employer] account executive    Social History Main Topics  . Smoking status: Current Every Day Smoker    Types: Cigarettes  . Smokeless tobacco: Not on file  . Alcohol Use: No  . Drug Use: No  . Sexually Active: Yes -- Male partner(s)   Other Topics Concern  . Not on file   Social History Narrative   Pt is married.  He is a former Database administrator.              Review of Systems ______ Constitutional: Negative for fever, chills, diaphoresis, activity change, appetite change and fatigue. ____ HENT: Negative for ear pain, nosebleeds, congestion, facial swelling, rhinorrhea, neck pain, neck stiffness and ear discharge.  ____ Eyes: Negative for pain, discharge, redness, itching and visual disturbance. ____ Respiratory: Negative for cough, choking, chest tightness, shortness of breath, wheezing and stridor.  ____ Cardiovascular: Negative for chest pain, palpitations and leg swelling. ____ Gastrointestinal: Negative for Nausea/ Vomiting/ Diarrhea or Consitpation Genitourinary: Negative for dysuria, urgency, frequency, hematuria, flank pain, decreased urine volume, difficulty urinating and  dyspareunia. ____ Musculoskeletal: Negative for back pain, joint swelling, arthralgias and gait problem. ________ Neurological: Negative for dizziness, tremors, seizures, syncope, facial asymmetry, speech difficulty, weakness, light-headedness, numbness and headaches. ____ Hematological: Negative for adenopathy. Does not bruise/bleed easily. ____ Psychiatric/Behavioral: Negative for hallucinations, behavioral problems, confusion, dysphoric mood, decreased concentration and agitation. ______   Objective:   Filed Vitals:   05/01/13 0959  BP: 184/105  Pulse: 61  Temp: 98.1 F (36.7 C)  Resp: 16    Physical Exam ______ Constitutional: Appears well-developed and well-nourished. No distress. ____ HENT: Normocephalic. External right and left ear normal. Oropharynx is clear and moist. ____ Eyes: Conjunctivae and EOM are normal. PERRLA, no scleral icterus. ____ Neck: Normal ROM. Neck supple. No JVD. No tracheal deviation. No thyromegaly. ____ CVS: RRR, S1/S2 +, no murmurs, no gallops, no carotid bruit.  Pulmonary: Effort and breath sounds normal, no stridor, rhonchi, wheezes, rales.  Abdominal: Soft. BS +,  no distension, tenderness, rebound or guarding. ________ Musculoskeletal: Normal range of motion. No edema and no tenderness. ____ Lymphadenopathy: No lymphadenopathy noted, cervical, inguinal. Neuro: Alert. Normal reflexes, muscle tone coordination. No cranial nerve deficit. Skin: Skin is warm and dry. No rash noted. Not diaphoretic. No erythema. No pallor. ____ Psychiatric: Normal mood and affect. Behavior, judgment, thought content normal. __  Lab Results  Component Value Date   WBC 11.3* 04/05/2006   HGB 13.0 04/05/2006   HCT 38.6* 04/05/2006   MCV  89.5 04/05/2006   PLT 222 04/05/2006   Lab Results  Component Value Date   CREATININE 1.09 04/18/2013   BUN 17 04/18/2013   NA 130* 04/18/2013   K 3.8 04/18/2013   CL 103 04/18/2013   CO2 25 04/18/2013    Lab Results  Component Value  Date   HGBA1C 4.9 03/11/2013   Lipid Panel     Component Value Date/Time   CHOL 137 03/11/2013 1135   TRIG 107 03/11/2013 1135   HDL 42 03/11/2013 1135   CHOLHDL 3.3 03/11/2013 1135   VLDL 21 03/11/2013 1135   LDLCALC 74 03/11/2013 1135       Assessment and plan:   Patient Active Problem List   Diagnosis Date Noted  . Accelerated hypertension 03/11/2013  . Family history of stroke 03/11/2013  . Uncontrolled hypertension 03/11/2013  . Smoker 03/11/2013  . Dental caries 03/11/2013  . Chronic periodontitis, unspecified 03/11/2013    #1. Hyponatremia- likely from HCTZ- will d/c- recheck Sodium today  #2. HTN- resume Atenolol and Lisinopril- HOLD HCTZ- return in 1 wk for recheck of BP and review of lab work.

## 2013-05-01 NOTE — Progress Notes (Signed)
PT HERE FOR RX REFILL BP MEDS. RAN OUT 2 DYS AGO BP ELEVATED 184/105. DENIES CP,DIZZINESS F/U BP MEDS. NON COMPLIANT

## 2013-05-02 LAB — BASIC METABOLIC PANEL
BUN: 12 mg/dL (ref 6–23)
CO2: 26 mEq/L (ref 19–32)
Calcium: 9.7 mg/dL (ref 8.4–10.5)
Chloride: 103 mEq/L (ref 96–112)
Creat: 1.09 mg/dL (ref 0.50–1.35)
Glucose, Bld: 99 mg/dL (ref 70–99)
Potassium: 4.5 mEq/L (ref 3.5–5.3)
Sodium: 137 mEq/L (ref 135–145)

## 2013-05-08 ENCOUNTER — Ambulatory Visit: Payer: No Typology Code available for payment source | Attending: Family Medicine | Admitting: *Deleted

## 2013-05-08 VITALS — BP 191/98 | HR 53

## 2013-05-08 DIAGNOSIS — I10 Essential (primary) hypertension: Secondary | ICD-10-CM

## 2013-05-08 NOTE — Progress Notes (Signed)
Patient states he did not sleep well last night Only slept about 1.5 hours

## 2013-07-15 ENCOUNTER — Other Ambulatory Visit: Payer: Self-pay

## 2013-10-03 ENCOUNTER — Emergency Department (HOSPITAL_COMMUNITY)
Admission: EM | Admit: 2013-10-03 | Discharge: 2013-10-03 | Disposition: A | Discharge status: Home / Self Care | Payer: BC Managed Care – PPO | Attending: Emergency Medicine | Admitting: Emergency Medicine

## 2013-10-03 ENCOUNTER — Encounter (HOSPITAL_COMMUNITY): Payer: Self-pay | Admitting: Emergency Medicine

## 2013-10-03 DIAGNOSIS — I10 Essential (primary) hypertension: Secondary | ICD-10-CM | POA: Insufficient documentation

## 2013-10-03 DIAGNOSIS — Z8619 Personal history of other infectious and parasitic diseases: Secondary | ICD-10-CM | POA: Insufficient documentation

## 2013-10-03 DIAGNOSIS — B029 Zoster without complications: Secondary | ICD-10-CM | POA: Insufficient documentation

## 2013-10-03 DIAGNOSIS — Z79899 Other long term (current) drug therapy: Secondary | ICD-10-CM | POA: Insufficient documentation

## 2013-10-03 DIAGNOSIS — R079 Chest pain, unspecified: Secondary | ICD-10-CM | POA: Insufficient documentation

## 2013-10-03 DIAGNOSIS — F17200 Nicotine dependence, unspecified, uncomplicated: Secondary | ICD-10-CM | POA: Insufficient documentation

## 2013-10-03 MED ORDER — OXYCODONE-ACETAMINOPHEN 5-325 MG PO TABS: 1.0000 | ORAL_TABLET | ORAL | Status: DC | PRN

## 2013-10-03 MED ORDER — ACYCLOVIR 400 MG PO TABS: 400.0000 mg | ORAL_TABLET | Freq: Four times a day (QID) | ORAL | Status: DC

## 2013-10-03 NOTE — ED Notes (Signed)
MD at bedside. 

## 2013-10-03 NOTE — ED Notes (Signed)
Back and left rib pain x 5 days. Rash x 2 days after using "pain rub" on area

## 2013-10-03 NOTE — ED Provider Notes (Signed)
CSN: [id number]     Arrival date & time 10/03/13  4098 History   First MD Initiated Contact with Patient 10/03/13 [phone]     Chief Complaint  Patient presents with  . Back Pain  . Rash   (Consider location/radiation/quality/duration/timing/severity/associated sxs/prior Treatment) HPI Comments: Patient here with chest and back pain that started about 1 week ago - he states that he has been doing push ups to work out and so he initially thought that he had "pulled a muscle", he started using muscle rub to help with the pain.  He states that about 2-3 days ago he began to notice a blistering rash and that the pain also worsened.  He reports no fever, chills, no change in pain with movement, states that the pain is burning in nature.  He denies nasal congestion, cough, shortness of breath, nausea or vomiting.  Patient is a 47 y.o. male presenting with rash. The history is provided by the patient. No language interpreter was used.  Rash Associated symptoms: no abdominal pain, no fever and no shortness of breath     Past Medical History  Diagnosis Date  . Hypertension    Past Surgical History  Procedure Laterality Date  . Bilateral knee arthroscopy     Family History  Problem Relation Age of Onset  . Hypertension Mother   . Hypertension Father   . Hypertension Mother   . Stroke Father    History  Substance Use Topics  . Smoking status: Current Every Day Smoker    Types: Cigarettes  . Smokeless tobacco: Not on file  . Alcohol Use: No    Review of Systems  Constitutional: Negative for fever and chills.  HENT: Negative for dental problem.   Respiratory: Negative for chest tightness and shortness of breath.   Cardiovascular: Positive for chest pain.  Gastrointestinal: Negative for abdominal pain.  Genitourinary: Negative for dysuria.  Musculoskeletal: Positive for back pain.  Skin: Positive for rash.  All other systems reviewed and are negative.    Allergies  Review of  patient's allergies indicates no known allergies.  Home Medications   Current Outpatient Rx  Name  Route  Sig  Dispense  Refill  . atenolol (TENORMIN) 25 MG tablet   Oral   Take 1 tablet (25 mg total) by mouth daily.   30 tablet   3   . lisinopril (PRINIVIL,ZESTRIL) 20 MG tablet   Oral   Take 1 tablet (20 mg total) by mouth daily.   90 tablet   3    BP 180/74  Pulse 74  Temp(Src) 98.2 F (36.8 C) (Oral)  Resp 16  Ht 5\' 9"  (1.753 m)  Wt 175 lb (79.379 kg)  BMI 25.83 kg/m2  SpO2 99% Physical Exam  Nursing note and vitals reviewed. Constitutional: He is oriented to person, place, and time. He appears well-developed and well-nourished. No distress.  HENT:  Head: Normocephalic and atraumatic.  Right Ear: External ear normal.  Left Ear: External ear normal.  Nose: Nose normal.  Mouth/Throat: Oropharynx is clear and moist. No oropharyngeal exudate.  Eyes: Conjunctivae are normal. Pupils are equal, round, and reactive to light. No scleral icterus.  Neck: Normal range of motion. Neck supple.  Cardiovascular: Normal rate, regular rhythm and normal heart sounds.  Exam reveals no gallop and no friction rub.   No murmur heard. Pulmonary/Chest: Breath sounds normal. No respiratory distress. He has no wheezes. He has no rales. He exhibits tenderness.  Tenderness along vesicular rash  Abdominal:  Soft. Bowel sounds are normal. He exhibits no distension. There is no tenderness. There is no rebound and no guarding.  Musculoskeletal: Normal range of motion. He exhibits tenderness. He exhibits no edema.  Tenderness along vesicular rash  Lymphadenopathy:    He has no cervical adenopathy.  Neurological: He is alert and oriented to person, place, and time. He exhibits normal muscle tone. Coordination normal.  Skin: Skin is warm and dry. Rash noted. Rash is vesicular. Rash is not papular, not maculopapular, not pustular and not urticarial. There is erythema. No pallor.     Vesicular rash  to left chest radiating around to back.  Psychiatric: He has a normal mood and affect. His behavior is normal. Judgment and thought content normal.    ED Course  Procedures (including critical care time) Labs Review Labs Reviewed - No data to display Imaging Review No results found.  EKG Interpretation   None       MDM  Shingles  Patient with history of chicken pox as a child presents with a week history of burning pain and now with vesicular rash c/w shingles.  No pain with movement but reports pain when his shirt touches the rash area.  Denies fever, chills, nausea or vomiting.    Izola Price Marisue Humble, [place] 10/03/13 [phone]

## 2013-10-03 NOTE — ED Notes (Signed)
Blisters and rash that started 1 week ago

## 2013-10-04 NOTE — ED Provider Notes (Signed)
Medical screening examination/treatment/procedure(s) were performed by non-physician practitioner and as supervising physician I was immediately available for consultation/collaboration.  EKG Interpretation   None         Glynn Octave, MD 10/04/13 1001

## 2013-12-12 ENCOUNTER — Ambulatory Visit
Admission: RE | Admit: 2013-12-12 | Discharge: 2013-12-12 | Disposition: A | Discharge status: Home / Self Care | Payer: BC Managed Care – PPO | Source: Ambulatory Visit | Attending: Family | Admitting: Family

## 2013-12-12 ENCOUNTER — Other Ambulatory Visit: Payer: Self-pay | Admitting: Family

## 2013-12-12 DIAGNOSIS — R7611 Nonspecific reaction to tuberculin skin test without active tuberculosis: Secondary | ICD-10-CM

## 2014-01-09 DIAGNOSIS — I10 Essential (primary) hypertension: Secondary | ICD-10-CM | POA: Insufficient documentation

## 2014-01-09 DIAGNOSIS — R011 Cardiac murmur, unspecified: Secondary | ICD-10-CM | POA: Insufficient documentation

## 2016-05-31 ENCOUNTER — Ambulatory Visit: Payer: Self-pay

## 2016-06-01 ENCOUNTER — Ambulatory Visit (INDEPENDENT_AMBULATORY_CARE_PROVIDER_SITE_OTHER): Payer: No Typology Code available for payment source | Admitting: Physician Assistant

## 2016-06-01 VITALS — BP 154/86 | HR 81 | Temp 99.1°F | Resp 16 | Ht 69.0 in | Wt 164.0 lb

## 2016-06-01 DIAGNOSIS — I499 Cardiac arrhythmia, unspecified: Secondary | ICD-10-CM | POA: Diagnosis not present

## 2016-06-01 DIAGNOSIS — R9431 Abnormal electrocardiogram [ECG] [EKG]: Secondary | ICD-10-CM

## 2016-06-01 DIAGNOSIS — Z9189 Other specified personal risk factors, not elsewhere classified: Secondary | ICD-10-CM | POA: Diagnosis not present

## 2016-06-01 DIAGNOSIS — I10 Essential (primary) hypertension: Secondary | ICD-10-CM

## 2016-06-01 MED ORDER — AMLODIPINE BESYLATE 10 MG PO TABS: 10.0000 mg | ORAL_TABLET | Freq: Every day | ORAL | 3 refills | Status: DC

## 2016-06-01 MED ORDER — CHLORTHALIDONE 25 MG PO TABS: 25.0000 mg | ORAL_TABLET | Freq: Every day | ORAL | 3 refills | Status: DC

## 2016-06-01 NOTE — Progress Notes (Signed)
06/02/2016 1:33 PM   DOB: 1966/01/05 / MRN: [id number]  SUBJECTIVE:  Melvin Burns is a 50 y.o. male presenting for BP check. He has been off of his medication for a few years and 3 days ago began having some "tingling" in his left thigh.  He felt this may be due to his BP and started taking his medication again and realized he was about to run out. He presents today for refills and to establish care.  He has never seen cardiology and has a history of heart murmur but he does not know what type.  He is an every day smoker with a 30 pack year history and does want to quit.  He likes to exercise daily via bike riding.  He is not having any leg swelling, chest pain, unusual SOB or DOE, dizziness or presyncope.    He has No Known Allergies.   He  has a past medical history of Hypertension.    He  reports that he has been smoking Cigarettes.  He does not have any smokeless tobacco history on file. He reports that he does not drink alcohol or use drugs. He  reports that he currently engages in sexual activity and has had male partners. The patient  has a past surgical history that includes Bilateral knee arthroscopy.  His family history includes Hypertension in his father and mother; Stroke in his father.  Review of Systems  Constitutional: Negative for chills and fever.  Respiratory: Negative for cough.   Cardiovascular: Negative for chest pain and palpitations.  Musculoskeletal: Negative for myalgias.  Skin: Negative for itching and rash.  Neurological: Negative for dizziness, tingling and headaches.    The problem list and medications were reviewed and updated by myself where necessary and exist elsewhere in the encounter.   OBJECTIVE:  BP (!) 154/86 (BP Location: Right Arm, Patient Position: Sitting, Cuff Size: Normal)   Pulse 81   Temp 99.1 F (37.3 C) (Oral)   Resp 16   Ht 5\' 9"  (1.753 m)   Wt 164 lb (74.4 kg)   SpO2 98%   BMI 24.22 kg/m   Physical Exam    Constitutional: He is oriented to person, place, and time. He appears well-developed and well-nourished. No distress.  Cardiovascular: Regular rhythm, normal heart sounds and intact distal pulses.  Exam reveals no gallop and no friction rub.   No murmur heard. Frequent premature contractions on auscultation.  Pulmonary/Chest: Effort normal and breath sounds normal. No respiratory distress. He has no wheezes. He has no rales. He exhibits no tenderness.  Abdominal: Soft. Bowel sounds are normal.  Musculoskeletal: He exhibits no edema.  Neurological: He is alert and oriented to person, place, and time. He has normal reflexes.  Skin: Skin is warm and dry. He is not diaphoretic.  Psychiatric: He has a normal mood and affect.   EKG: NSR with frequent PAC and one PVC.  Normal axis, probable LVH, no signs of infarction.   No results found for this or any previous visit (from the past 72 hour(s)).  No results found.  ASSESSMENT AND PLAN  Melvin Burns was seen today for medical management of chronic issues and tingling.  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  He is asymptomatic today.  I am referring him to cardiology and refilling his BP meds. Advised that he return in 3 months for recheck and look forward to receiving correspondence  for Dr. Christie NottinghamGhangi's office.  -     COMPLETE METABOLIC PANEL WITH GFR -     CBC -     TSH -     amLODipine (NORVASC) 10 MG tablet; Take 1 tablet (10 mg total) by mouth daily. -     chlorthalidone (HYGROTON) 25 MG tablet; Take 1 tablet (25 mg total) by mouth daily.  Cardiac arrhythmia, unspecified cardiac arrhythmia type -     EKG 12-Lead  Nonspecific abnormal electrocardiogram (ECG) (EKG) -     Ambulatory referral to Cardiology  At risk for heart disease -     Lipid panel -     Hemoglobin A1c -     Ambulatory referral to Cardiology    The patient is  advised to call or return to clinic if he does not see an improvement in symptoms, or to seek the care of the closest emergency department if he worsens with the above plan.   Deliah BostonMichael Jaeceon Michelin, MHS, PA-C [facility] 06/02/2016 1:33 PM

## 2016-06-01 NOTE — Patient Instructions (Signed)
     IF you received an x-ray today, you will receive an invoice from [facility]. Please contact  Radiology at [phone] with questions or concerns regarding your invoice.   IF you received labwork today, you will receive an invoice from [facility]. Please contact [facility] at [phone] with questions or concerns regarding your invoice.   Our billing staff will not be able to assist you with questions regarding bills from these companies.  You will be contacted with the lab results as soon as they are available. The fastest way to get your results is to activate your My Chart account. Instructions are located on the last page of this paperwork. If you have not heard from us regarding the results in 2 weeks, please contact this office.      

## 2016-06-02 LAB — COMPLETE METABOLIC PANEL WITH GFR
ALT: 24 U/L (ref 9–46)
AST: 20 U/L (ref 10–35)
Albumin: 4.2 g/dL (ref 3.6–5.1)
Alkaline Phosphatase: 78 U/L (ref 40–115)
BUN: 13 mg/dL (ref 7–25)
CO2: 27 mmol/L (ref 20–31)
Calcium: 9.8 mg/dL (ref 8.6–10.3)
Chloride: 101 mmol/L (ref 98–110)
Creat: 0.99 mg/dL (ref 0.70–1.33)
GFR, Est African American: >89 mL/min (ref >=60)
GFR, Est Non African American: 88 mL/min (ref >=60)
Glucose, Bld: 82 mg/dL (ref 65–99)
Potassium: 4.1 mmol/L (ref 3.5–5.3)
Sodium: 135 mmol/L (ref 135–146)
Total Bilirubin: 0.6 mg/dL (ref 0.2–1.2)
Total Protein: 7.9 g/dL (ref 6.1–8.1)

## 2016-06-02 LAB — CBC
HCT: 43.8 % (ref 38.5–50.0)
Hemoglobin: 13.8 g/dL (ref 13.2–17.1)
MCH: 27.5 pg (ref 27.0–33.0)
MCHC: 31.5 g/dL — ABNORMAL LOW (ref 32.0–36.0)
MCV: 87.4 fL (ref 80.0–100.0)
MPV: 12 fL (ref 7.5–12.5)
Platelets: 297 10*3/uL (ref 140–400)
RBC: 5.01 MIL/uL (ref 4.20–5.80)
RDW: 14 % (ref 11.0–15.0)
WBC: 10.9 10*3/uL — ABNORMAL HIGH (ref 3.8–10.8)

## 2016-06-02 LAB — TSH: TSH: 0.97 mIU/L (ref 0.40–4.50)

## 2016-06-03 LAB — LIPID PANEL
Cholesterol: 157 mg/dL (ref 125–200)
HDL: 47 mg/dL (ref >=40)
LDL Cholesterol: 84 mg/dL (ref <130)
Total CHOL/HDL Ratio: 3.3 Ratio (ref <=5.0)
Triglycerides: 129 mg/dL (ref <150)
VLDL: 26 mg/dL (ref <30)

## 2016-06-03 LAB — HEMOGLOBIN A1C
Hgb A1c MFr Bld: 4.5 % (ref <5.7)
Mean Plasma Glucose: 82 mg/dL

## 2016-07-05 ENCOUNTER — Encounter: Payer: Self-pay | Admitting: Cardiovascular Disease

## 2016-07-05 ENCOUNTER — Ambulatory Visit (INDEPENDENT_AMBULATORY_CARE_PROVIDER_SITE_OTHER): Payer: PRIVATE HEALTH INSURANCE | Admitting: Cardiovascular Disease

## 2016-07-05 VITALS — BP 155/85 | HR 94 | Ht 69.0 in | Wt 167.8 lb

## 2016-07-05 DIAGNOSIS — R011 Cardiac murmur, unspecified: Secondary | ICD-10-CM | POA: Diagnosis not present

## 2016-07-05 DIAGNOSIS — R9431 Abnormal electrocardiogram [ECG] [EKG]: Secondary | ICD-10-CM

## 2016-07-05 DIAGNOSIS — I10 Essential (primary) hypertension: Secondary | ICD-10-CM | POA: Diagnosis not present

## 2016-07-05 MED ORDER — LISINOPRIL 10 MG PO TABS: 10.0000 mg | ORAL_TABLET | Freq: Every day | ORAL | 5 refills | Status: DC

## 2016-07-05 NOTE — Patient Instructions (Addendum)
Medication Instructions:  START LISINOPRIL 10 MG DAILY   Labwork: none  Testing/Procedures: Your physician has requested that you have an echocardiogram. Echocardiography is a painless test that uses sound waves to create images of your heart. It provides your doctor with information about the size and shape of your heart and how well your heart's chambers and valves are working. This procedure takes approximately one hour. There are no restrictions for this procedure. [facility] [address]   Follow-Up: Your physician recommends that you schedule a follow-up appointment in: 1 month ov  If you need a refill on your cardiac medications before your next appointment, please call your pharmacy.

## 2016-07-05 NOTE — Progress Notes (Signed)
Cardiology Office Note   Date:  07/05/2016   ID:  Melvin Burns, DOB 07/24/1966, MRN [id number]  PCP:  Jeanann LewandowskyJEGEDE, OLUGBEMIGA, MD  Cardiologist:   Chilton Siiffany [place], MD   Chief Complaint  Patient presents with  . Follow-up      History of Present Illness: Melvin Burns is a 50 y.o. male with hypertension and tobacco abuse who presents for Evaluation of an abnormal ECG.  Melvin Burns was seen by his PCP on 8/23 and was noted to have an abnormal EKG.  he had been off his blood pressure medication for several years.  At that appointment his blood pressure was 154/86. Amlodipine and chlorthalidone restarted. He has been taking them as prescribed. An EKG was obtained at that appointment and he was noted to have a PAC as well as a PVC. He was also noted to have LVH with repolarization abnormality.  He was referred to cardiology for further evaluation. Melvin Burns has been feeling well. He occasionally has left-sided chest pain. However, this typically occurs after he has been exercising and lifting weights. He denies chest pain with exertion and also denies shortness of breath. He hasn't noted any lower extremity edema, orthopnea, or PND. Since his appointment with primary care he has not been working out. He is concerned that there might be a problem with his heart based on EKG. He typically exercises between 1 and 7 times per week. He also likes to ride his bike. He is very physically active at work and tyically has 9000 steps on his pedometer at the end of the day.  Melvin Burns is trying to quit smoking. He is currently smoking half pack per day. He purchased Nicorette gum in hopes this will help him to quit.   Past Medical History:  Diagnosis Date  . Hypertension     Past Surgical History:  Procedure Laterality Date  . BILATERAL KNEE ARTHROSCOPY       Current Outpatient Prescriptions  Medication Sig Dispense Refill  . amLODipine (NORVASC) 10 MG tablet Take 1 tablet (10 mg  total) by mouth daily. 30 tablet 3  . chlorthalidone (HYGROTON) 25 MG tablet Take 1 tablet (25 mg total) by mouth daily. 30 tablet 3  . lisinopril (PRINIVIL,ZESTRIL) 10 MG tablet Take 1 tablet (10 mg total) by mouth daily. 30 tablet 5   No current facility-administered medications for this visit.     Allergies:   Review of patient's allergies indicates no known allergies.    Social History:  The patient  reports that he has been smoking Cigarettes.  He does not have any smokeless tobacco history on file. He reports that he does not drink alcohol or use drugs.   Family History:  The patient's family history includes Hypertension in his father and mother; Stroke in his father.    ROS:  Please see the history of present illness.   Otherwise, review of systems are positive for none.   All other systems are reviewed and negative.    PHYSICAL EXAM: VS:  BP (!) 155/85   Pulse 94   Ht 5\' 9"  (1.753 m)   Wt 167 lb 12.8 oz (76.1 kg)   BMI 24.78 kg/m  , BMI Body mass index is 24.78 kg/m. GENERAL:  Well appearing HEENT:  Pupils equal round and reactive, fundi not visualized, oral mucosa unremarkable NECK:  No jugular venous distention, waveform within normal limits, carotid upstroke brisk and symmetric, no bruits, no thyromegaly LYMPHATICS:  No cervical adenopathy  LUNGS:  Clear to auscultation bilaterally HEART:  RRR.  PMI not displaced or sustained,S1 and S2 within normal limits, no S3, no S4, no clicks, no rubs, II/VI systolic murmur at the LUSB.  It increases with both Valsalva and hand grip. ABD:  Flat, positive bowel sounds normal in frequency in pitch, no bruits, no rebound, no guarding, no midline pulsatile mass, no hepatomegaly, no splenomegaly EXT:  2 plus pulses throughout, no edema, no cyanosis no clubbing SKIN:  No rashes no nodules NEURO:  Cranial nerves II through XII grossly intact, motor grossly intact throughout PSYCH:  Cognitively intact, oriented to person place and  time  EKG:  EKG is ordered today. The ekg ordered today demonstrates sinus rhythm.  Rate 94 bpm.  PACs.  LVH with repolariazation abnormality.    Recent Labs: 06/01/2016: ALT 24; BUN 13; Creat 0.99; Hemoglobin 13.8; Platelets 297; Potassium 4.1; Sodium 135; TSH 0.97    Lipid Panel    Component Value Date/Time   CHOL 157 06/01/2016 1756   TRIG 129 06/01/2016 1756   HDL 47 06/01/2016 1756   CHOLHDL 3.3 06/01/2016 1756   VLDL 26 06/01/2016 1756   LDLCALC 84 06/01/2016 1756      Wt Readings from Last 3 Encounters:  07/05/16 167 lb 12.8 oz (76.1 kg)  06/01/16 164 lb (74.4 kg)  10/03/13 175 lb (79.4 kg)      ASSESSMENT AND PLAN:  # Hypertension: Blood pressure remains elevated. He will continue amlodipine and chlorthalidone. We'll add lisinopril 10 mg daily.  He will check his blood pressures and increased to 20 mg if it remains over 140/90. In 1 month we will check a basic metabolic panel.  # Abnormal EKG: # Murmur: Melvin Burns has evidence of left ventricular hypertrophy on his EKG. He also has a systolic murmur on exam that augments with both Valsalva maneuver and hand grip . It is possible that he might have HCM or in intracavitary gradient due to LVH. We will obtain an echocardiogram.   Current medicines are reviewed at length with the patient today.  The patient does not have concerns regarding medicines.  The following changes have been made:  Add lisinopril 10 mg daily   Labs/ tests ordered today include:   Orders Placed This Encounter  Procedures  . EKG 12-Lead  . ECHOCARDIOGRAM COMPLETE     Disposition:   FU with Lux Skilton C. Duke Salvia, MD, [facility] in 1 month    This note was written with the assistance of speech recognition software.  Please excuse any transcriptional errors.  Signed, Pascual Mantel C. Duke Salvia, MD, [facility]  07/05/2016 5:20 PM     [facility]

## 2016-07-25 ENCOUNTER — Other Ambulatory Visit (HOSPITAL_COMMUNITY): Payer: PRIVATE HEALTH INSURANCE

## 2016-08-05 ENCOUNTER — Ambulatory Visit: Payer: PRIVATE HEALTH INSURANCE | Admitting: Cardiovascular Disease

## 2016-08-08 ENCOUNTER — Ambulatory Visit (HOSPITAL_COMMUNITY): Payer: No Typology Code available for payment source | Attending: Cardiology

## 2016-08-08 ENCOUNTER — Other Ambulatory Visit: Payer: Self-pay

## 2016-08-08 DIAGNOSIS — R011 Cardiac murmur, unspecified: Secondary | ICD-10-CM | POA: Insufficient documentation

## 2016-08-08 DIAGNOSIS — I071 Rheumatic tricuspid insufficiency: Secondary | ICD-10-CM | POA: Insufficient documentation

## 2016-08-08 DIAGNOSIS — I352 Nonrheumatic aortic (valve) stenosis with insufficiency: Secondary | ICD-10-CM | POA: Diagnosis not present

## 2016-08-08 DIAGNOSIS — Z87891 Personal history of nicotine dependence: Secondary | ICD-10-CM | POA: Diagnosis not present

## 2016-08-08 DIAGNOSIS — I052 Rheumatic mitral stenosis with insufficiency: Secondary | ICD-10-CM | POA: Insufficient documentation

## 2016-08-08 DIAGNOSIS — I119 Hypertensive heart disease without heart failure: Secondary | ICD-10-CM | POA: Insufficient documentation

## 2016-08-08 LAB — ECHOCARDIOGRAM COMPLETE
AO mean calculated velocity dopler: 161 cm/s
AV Area VTI index: 1.05 cm2/m2
AV Area VTI: 1.87 cm2
AV Area mean vel: 1.93 cm2
AV Mean grad: 12 mmHg
AV Peak grad: 25 mmHg
AV VEL mean LVOT/AV: 0.51
AV area mean vel ind: 1.01 cm2/m2
AV peak Index: 0.97
AV pk vel: 250 cm/s
AV vel: 2.02
Ao pk vel: 0.49 m/s
Ao-asc: 31 cm
Area-P 1/2: 1.95 cm2
E decel time: 282 msec
E/e' ratio: 31.6
FS: 29 % (ref 28–44)
IVS/LV PW RATIO, ED: 1.04
LA ID, A-P, ES: 55 mm
LA diam end sys: 55 mm
LA diam index: 2.86 cm/m2
LA vol A4C: 121 ml
LA vol index: 57.8 mL/m2
LA vol: 111 mL
LV E/e' medial: 31.6
LV E/e'average: 31.6
LV PW d: 13.5 mm — AB (ref 0.6–1.1)
LV dias vol index: 63 mL/m2
LV dias vol: 121 mL (ref 62–150)
LV e' LATERAL: 8.48 cm/s
LV sys vol index: 24 mL/m2
LV sys vol: 47 mL (ref 21–61)
LVOT MV VTI INDEX: 0.52 cm2/m2
LVOT MV VTI: 1
LVOT SV: 93 mL
LVOT VTI: 24.5 cm
LVOT area: 3.8 cm2
LVOT diameter: 22 mm
LVOT peak VTI: 0.53 cm
LVOT peak grad rest: 6 mmHg
LVOT peak vel: 123 cm/s
Lateral S' vel: 10.9 cm/s
MV Annulus VTI: 93.4 cm
MV Dec: 282
MV M vel: 191
MV Peak grad: 29 mmHg
MV pk A vel: 226 m/s
MV pk E vel: 268 m/s
Mean grad: 16 mmHg
P 1/2 time: 113 ms
P 1/2 time: 583 ms
Simpson's disk: 61
Stroke v: 74 ml
TDI e' lateral: 8.48
TDI e' medial: 5.85
VTI: 46.2 cm
Valve area index: 1.05
Valve area: 2.02 cm2

## 2016-08-10 ENCOUNTER — Telehealth: Payer: Self-pay | Admitting: *Deleted

## 2016-08-10 NOTE — Telephone Encounter (Signed)
-----   Message from Chilton Siiffany [place], MD sent at 08/10/2016  3:42 PM EDT ----- Echo shows that his heart squeezes well but does not relax completely.  It will be important to keep his blood pressure under good control.  There was mild stenosis of the aortic valve and mild leaking of the aortic valve.  The mitral valve was severely thickened and leaking.  Please keep scheduled follow up to discuss.

## 2016-08-10 NOTE — Telephone Encounter (Signed)
Left message to call back  

## 2016-08-16 ENCOUNTER — Encounter: Payer: Self-pay | Admitting: Cardiovascular Disease

## 2016-08-22 ENCOUNTER — Encounter: Payer: Self-pay | Admitting: Cardiovascular Disease

## 2016-08-22 ENCOUNTER — Ambulatory Visit (INDEPENDENT_AMBULATORY_CARE_PROVIDER_SITE_OTHER): Payer: PRIVATE HEALTH INSURANCE | Admitting: Cardiovascular Disease

## 2016-08-22 VITALS — BP 175/98 | HR 75 | Ht 69.0 in | Wt 172.0 lb

## 2016-08-22 DIAGNOSIS — N522 Drug-induced erectile dysfunction: Secondary | ICD-10-CM | POA: Diagnosis not present

## 2016-08-22 DIAGNOSIS — I10 Essential (primary) hypertension: Secondary | ICD-10-CM | POA: Diagnosis not present

## 2016-08-22 DIAGNOSIS — I052 Rheumatic mitral stenosis with insufficiency: Secondary | ICD-10-CM

## 2016-08-22 HISTORY — DX: Rheumatic mitral stenosis with insufficiency: I05.2

## 2016-08-22 MED ORDER — TADALAFIL 5 MG PO TABS
ORAL_TABLET | ORAL | 3 refills | Status: DC
Start: 2016-08-22 — End: 2019-06-11

## 2016-08-22 NOTE — Progress Notes (Signed)
Cardiology Office Note   Date:  08/22/2016   ID:  Melvin Burns, DOB [birth year], MRN [id number]  PCP:  Jeanann LewandowskyJEGEDE, OLUGBEMIGA, MD  Cardiologist:   Chilton Siiffany [place], MD   Chief Complaint  Patient presents with  . Follow-up    Patient states no Sx.       History of Present Illness: Melvin Burns is a 50 y.o. male with hypertension and tobacco abuse who presents for follow up.  Melvin Burns was first seen 07/05/16 due to an abnormal EKG.  He saw his PCP on 8/23 and was noted to have PACs as well as a PVC. He was also noted to have LVH with repolarization abnormality.  He was referred to cardiology for further evaluation.  He was feeling well but did note occasional left sided chest pain.  He was also noted to have a murmur on exam.  He was referred for an echo that showed LVEF 60-65%.  He had severe mitral stenosis with a mean gradient of 17 mmHg and moderate mitral regurgitation.  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  His blood presure was elevated and lisinopril was added to his regimen.    Since his last appointment Melvin Burns has been feeling well.  He misunderstood and stopped taking amlodipine and chlorthalidone when he started lisinopril.  He denies any recent chest pain or shortness of breath.  He also hasn't noted any lower extremity edema, orthopnea or PND.  He continues to be very physically active and denies symptoms.     Past Medical History:  Diagnosis Date  . Hypertension   . 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  Moderate mitral regurgitation.    Past Surgical History:  Procedure Laterality Date  . BILATERAL KNEE ARTHROSCOPY       Current Outpatient Prescriptions  Medication Sig Dispense Refill  . amLODipine (NORVASC) 10 MG tablet Take 1 tablet (10 mg total) by mouth daily. 30 tablet 3  . chlorthalidone (HYGROTON) 25 MG tablet Take 1 tablet (25 mg total) by mouth  daily. 30 tablet 3  . lisinopril (PRINIVIL,ZESTRIL) 10 MG tablet Take 1 tablet (10 mg total) by mouth daily. 30 tablet 5  . tadalafil (CIALIS) 5 MG tablet 1 TO 2 TABLETS AS NEEDED 10 tablet 3   No current facility-administered medications for this visit.     Allergies:   Patient has no known allergies.    Social History:  The patient  reports that he has been smoking Cigarettes.  He has never used smokeless tobacco. He reports that he does not drink alcohol or use drugs.   Family History:  The patient's family history includes Hypertension in his father and mother; Stroke in his father.    ROS:  Please see the history of present illness.   Otherwise, review of systems are positive for none.   All other systems are reviewed and negative.    PHYSICAL EXAM: VS:  BP (!) 175/98   Pulse 75   Ht 5\' 9"  (1.753 m)   Wt 78 kg (172 lb)   BMI 25.40 kg/m  , BMI Body mass index is 25.4 kg/m. GENERAL:  Well appearing HEENT:  Pupils equal round and reactive, fundi not visualized, oral mucosa unremarkable NECK:  No jugular venous distention, waveform within normal limits, carotid upstroke brisk and symmetric, no bruits LYMPHATICS:  No cervical adenopathy LUNGS:  Clear to auscultation bilaterally HEART:  RRR.  PMI not displaced or sustained,S1 and S2 within normal limits, no S3, no S4, no clicks, no rubs, II/VI systolic murmur at the LUSB.  It increases with both Valsalva and hand grip. ABD:  Flat, positive bowel sounds normal in frequency in pitch, no bruits, no rebound, no guarding, no midline pulsatile mass, no hepatomegaly, no splenomegaly EXT:  2 plus pulses throughout, no edema, no cyanosis no clubbing SKIN:  No rashes no nodules NEURO:  Cranial nerves II through XII grossly intact, motor grossly intact throughout PSYCH:  Cognitively intact, oriented to person place and time  EKG:  EKG is ordered today. The ekg ordered today demonstrates sinus rhythm.  Rate 94 bpm.  PACs.  LVH with  repolariazation abnormality.   Echo 08/08/16: Study Conclusions  - Left ventricle: The cavity size was normal. There was mild focal   basal hypertrophy of the septum. Systolic function was normal.   The estimated ejection fraction was in the range of 60% to 65%.   Wall motion was normal; there were no regional wall motion   abnormalities. Features are consistent with a pseudonormal left   ventricular filling pattern, with concomitant abnormal relaxation   and increased filling pressure (grade 2 diastolic dysfunction).   Doppler parameters are consistent with elevated ventricular   end-diastolic filling pressure. - Aortic valve: Transvalvular velocity was minimally increased.   There was mild stenosis. There was mild regurgitation. Peak   gradient (S): 25 mm Hg. - Aortic root: The aortic root was normal in size. - Mitral valve: Severely thickened and mildly calcified mitral   valve that is rheumatic in apearance. There is limited leaflet   separation with severe mitral stenosis and moderate   regurgitation. The findings are consistent with severe stenosis.   There was moderate regurgitation. Mean gradient (D): 17 mm Hg.   Peak gradient (D): 40 mm Hg. - Left atrium: The atrium was severely dilated. - Right ventricle: Systolic function was normal. - Tricuspid valve: There was mild regurgitation. - Pulmonic valve: There was no regurgitation. - Pulmonary arteries: Systolic pressure was within the normal   range. - Inferior vena cava: The vessel was normal in size. - Pericardium, extracardiac: There was no pericardial effusion.  Impressions:  - Severely thickened and mildly calcified mitral valve that is   rheumatic in apearance. There is limited leaflet eparation with   severe mitral stenosis and moderate regurgitation.   Recent Labs: 06/01/2016: ALT 24; BUN 13; Creat 0.99; Hemoglobin 13.8; Platelets 297; Potassium 4.1; Sodium 135; TSH 0.97    Lipid Panel    Component  Value Date/Time   CHOL 157 06/01/2016 1756   TRIG 129 06/01/2016 1756   HDL 47 06/01/2016 1756   CHOLHDL 3.3 06/01/2016 1756   VLDL 26 06/01/2016 1756   LDLCALC 84 06/01/2016 1756      Wt Readings from Last 3 Encounters:  08/22/16 78 kg (172 lb)  07/05/16 76.1 kg (167 lb 12.8 oz)  06/01/16 74.4 kg (164 lb)      ASSESSMENT AND PLAN:  # Hypertension: Blood pressure remains elevated.  He misunderstood and stopped taking his other medications when he added lisinopril.  He will restart amlodipine and chlorthalidone. Continue lisinopril 10 mg daily.  #Rheumatic mitral valve disease: Melvin Burns has severe mitral stenosis and moderate mitral regurgitation.  He is currently asymptomatic and very physically active.  He doesn't have any evidence of heart failure.  We will monitor him closely, but for now, there is not indication for valve replacement.  Given his moderate mitral regurgitation he would not be a good candidate for balloon valvotomy.   # Erectile dysfunction: Melvin Burns reports difficulty with erectile dysfunction.  We discussed the importance of adequately controlling his BP.  We will also start Cialis 5-10mg  prn.  Current medicines are reviewed at length with the patient today.  The patient does not have concerns regarding medicines.  The following changes have been made:  Restart chlorthalidone and amlodipine.  Labs/ tests ordered today include:   No orders of the defined types were placed in this encounter.    Disposition:   FU with Aby Gessel C. Duke Salviaandolph, MD, [facility] in 3 months.  Pharmacy in 1 week    This note was written with the assistance of speech recognition software.  Please excuse any transcriptional errors.  Signed, Humza Tallerico C. Duke Salviaandolph, MD, [facility]  08/22/2016 11:35 PM    [facility]

## 2016-08-22 NOTE — Telephone Encounter (Signed)
Reviewed at ov today

## 2016-08-22 NOTE — Patient Instructions (Addendum)
Medication Instructions:  START TAKING THE LISINOPRIL, CHLORTHALIDONE, AND AMLODIPINE DAILY   RX FOR CIALIS 5 MG 1 TO 2 TABLETS AS NEEDED HAS BEEN SENT TO [facility]  Labwork: NONE  Testing/Procedures: NONE  Follow-Up: Your physician recommends that you schedule a follow-up appointment in: PHARM D 1 WEEK  Your physician recommends that you schedule a follow-up appointment in: [facility]  If you need a refill on your cardiac medications before your next appointment, please call your pharmacy.

## 2016-08-31 ENCOUNTER — Ambulatory Visit (INDEPENDENT_AMBULATORY_CARE_PROVIDER_SITE_OTHER): Payer: PRIVATE HEALTH INSURANCE | Admitting: Pharmacist

## 2016-08-31 VITALS — BP 124/72 | HR 66

## 2016-08-31 DIAGNOSIS — I10 Essential (primary) hypertension: Secondary | ICD-10-CM

## 2016-08-31 NOTE — Progress Notes (Signed)
Patient ID: Melvin Burns                 DOB: 24-Sep-1966                      MRN: [id number]     HPI: Melvin Burns is a 50 y.o. male patient of Dr. Duke Burns with PMH below who presents today for hypertension evaluation. He was recently seen by Dr. Duke Burns at which time his amlodipine and chlorthalidone were restarted. He had discontinued these after confusion once starting lisinopril.   Pt reports doing well on all three medications. He reports that he has not checked his pressure since starting all three because he was nervous about what the numbers would be.  He also reports that Cialis is not covered by insurance. He states he is not sure if insurance will cover any of the alternatives.   Cardiac Hx: Htn, rheumatic mitral stenosis with regurg  Current HTN meds:  Chlorthalidone 25mg  daily Amlodipine 10mg  daily Lisinopril 10mg  daily  BP goal: <130/80  Family History: His father had hypertension.   Social History: He is a current smoker. He has cut himself back to 1/3 ppd. He states he is trying to quit. He was using nicorette before and states that this made him dislike the taste of cigarettes. We discussed that this is more of a habit for him than that he is really craving. Discussed him setting a quit day.   Diet: He eats most of his meals from home and endorses cooking with salt and seasonings, but not adding at the table. He drinks 2-3 cups of coffee at work. He also endorses that the majority of his fluid intake is green tea.   Exercise: He goes to the gym or rides his bike 2 times per week.   Home BP readings: Has not checked since seeing Dr. Justin Burns  Wt Readings from Last 3 Encounters:  08/22/16 172 lb (78 kg)  07/05/16 167 lb 12.8 oz (76.1 kg)  06/01/16 164 lb (74.4 kg)   BP Readings from Last 3 Encounters:  08/31/16 124/72  08/22/16 (!) 175/98  07/05/16 (!) 155/85   Pulse Readings from Last 3 Encounters:  08/31/16 66  08/22/16 75  07/05/16 94     Renal function: CrCl cannot be calculated (Patient's most recent lab result is older than the maximum 21 days allowed.).  Past Medical History:  Diagnosis Date  . Hypertension   . 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  Moderate mitral regurgitation.    Current Outpatient Prescriptions on File Prior to Visit  Medication Sig Dispense Refill  . amLODipine (NORVASC) 10 MG tablet Take 1 tablet (10 mg total) by mouth daily. 30 tablet 3  . chlorthalidone (HYGROTON) 25 MG tablet Take 1 tablet (25 mg total) by mouth daily. 30 tablet 3  . lisinopril (PRINIVIL,ZESTRIL) 10 MG tablet Take 1 tablet (10 mg total) by mouth daily. 30 tablet 5  . tadalafil (CIALIS) 5 MG tablet 1 TO 2 TABLETS AS NEEDED 10 tablet 3   No current facility-administered medications on file prior to visit.     No Known Allergies  Blood pressure 124/72, pulse 66, SpO2 98 %.   Assessment/Plan: Hypertension: BP at goal today in office. Continue current medications and follow up with Dr. Duke Burns as scheduled. Lengthy discussion about quitting smoking and benefits for cardiovascular health. He will think about quit date  and call if he needs additional resources for quitting. Follow up in pharmacy clinic as needed.    Thank you, Melvin Burns, PharmD  [facility]  08/31/2016 4:40 PM

## 2016-08-31 NOTE — Patient Instructions (Signed)
Return for a follow up appointment as scheduled with Dr. Duke Salviaandolph  Check your blood pressure at home daily (if able) and keep record of the readings.  Take your BP meds as follows: Continue medications as prescribed  Bring all of your meds, your BP cuff and your record of home blood pressures to your next appointment.  Exercise as you're able, try to walk approximately 30 minutes per day.  Keep salt intake to a minimum, especially watch canned and prepared boxed foods.  Eat more fresh fruits and vegetables and fewer canned items.  Avoid eating in fast food restaurants.    HOW TO TAKE YOUR BLOOD PRESSURE: . Rest 5 minutes before taking your blood pressure. .  Don't smoke or drink caffeinated beverages for at least 30 minutes before. . Take your blood pressure before (not after) you eat. . Sit comfortably with your back supported and both feet on the floor (don't cross your legs). . Elevate your arm to heart level on a table or a desk. . Use the proper sized cuff. It should fit smoothly and snugly around your bare upper arm. There should be enough room to slip a fingertip under the cuff. The bottom edge of the cuff should be 1 inch above the crease of the elbow. . Ideally, take 3 measurements at one sitting and record the average.

## 2016-10-04 ENCOUNTER — Telehealth: Payer: Self-pay | Admitting: Cardiovascular Disease

## 2016-10-04 NOTE — Telephone Encounter (Signed)
Please advise 

## 2016-10-04 NOTE — Telephone Encounter (Signed)
New Message   Pt c/o medication issue:  1. Name of Medication: Cialis   2. How are you currently taking this medication (dosage and times per day)? 5mg   3. Are you having a reaction (difficulty breathing--STAT)?  No  4. What is your medication issue? Medication is expensive, and wants to make sure he's taking proper dosage.

## 2016-10-05 ENCOUNTER — Telehealth: Payer: Self-pay | Admitting: Cardiovascular Disease

## 2016-10-05 NOTE — Telephone Encounter (Signed)
F/u Message ° °Pt returning RN call. Please call back to discuss  °

## 2016-10-05 NOTE — Telephone Encounter (Signed)
Patient did not answer. No message left.

## 2016-10-05 NOTE — Telephone Encounter (Signed)
Phone cut off while talking to the patient. Left detailed message on voicemail of proper use of cialis. He is to call with further problems.

## 2016-10-08 NOTE — Telephone Encounter (Signed)
This medication can be expensive.  If sildenafil is less expensive he can try that.  Recommend following up with his PCP or urology if it is not effective

## 2016-10-10 ENCOUNTER — Other Ambulatory Visit: Payer: Self-pay | Admitting: Physician Assistant

## 2016-10-10 DIAGNOSIS — I10 Essential (primary) hypertension: Secondary | ICD-10-CM

## 2016-11-07 ENCOUNTER — Other Ambulatory Visit: Payer: Self-pay | Admitting: Physician Assistant

## 2016-11-07 DIAGNOSIS — I10 Essential (primary) hypertension: Secondary | ICD-10-CM

## 2016-11-08 NOTE — Telephone Encounter (Signed)
OK to refill this medication. Pt was advised to FU with Tiffany C. Duke Salviaandolph, MD, [facility] in Jan 2018.

## 2016-11-30 ENCOUNTER — Ambulatory Visit: Payer: PRIVATE HEALTH INSURANCE | Admitting: Cardiovascular Disease

## 2016-12-15 ENCOUNTER — Encounter: Payer: Self-pay | Admitting: Cardiovascular Disease

## 2016-12-15 ENCOUNTER — Ambulatory Visit (INDEPENDENT_AMBULATORY_CARE_PROVIDER_SITE_OTHER): Payer: No Typology Code available for payment source | Admitting: Cardiovascular Disease

## 2016-12-15 VITALS — BP 118/72 | HR 81 | Ht 69.0 in | Wt 172.2 lb

## 2016-12-15 DIAGNOSIS — I10 Essential (primary) hypertension: Secondary | ICD-10-CM | POA: Diagnosis not present

## 2016-12-15 DIAGNOSIS — I052 Rheumatic mitral stenosis with insufficiency: Secondary | ICD-10-CM | POA: Diagnosis not present

## 2016-12-15 DIAGNOSIS — Z720 Tobacco use: Secondary | ICD-10-CM

## 2016-12-15 NOTE — Progress Notes (Signed)
Cardiology Office Note   Date:  12/15/2016   ID:  Melvin Burns, DOB 05/28/1966, MRN [id number]  PCP:  Jeanann Lewandowsky, MD  Cardiologist:   Chilton Si, MD   Chief Complaint  Patient presents with  . Follow-up      History of Present Illness: Melvin Burns is a 51 y.o. male with Rheumatic mitral valve disease, severe mitral stenosis and moderate mitral regurgitation, hypertension and tobacco abuse who presents for follow up.  Mr. Andres was first seen 07/05/16 due to an abnormal EKG.  He saw his PCP on 8/23 and was noted to have PACs as well as a PVC. He was also noted to have LVH with repolarization abnormality.  He was referred to cardiology for further evaluation.  At that appointment he was noted to have a murmur on exam.  He had an echo 08/08/16 that showed LVEF 60-65%.  He had severe mitral stenosis with a mean gradient of 17 mmHg and moderate mitral regurgitation.  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  His blood presure was elevated and lisinopril was added to his regimen.  Overall his blood pressure has been well-controlled since that time. He tried to quit smoking and use Nicorette gum. This helped him slow down but he was not able to quit completely. He did not know how to use the gum and so he was only taking one or 2 pieces of day. He continues to go to the gym often and is very active at work. He has no chest pain or shortness of breath with this activity. He does both cardio and lifts weights. He denies lower extremity edema, orthopnea, or PND.  Past Medical History:  Diagnosis Date  . Hypertension   . 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  Moderate mitral regurgitation.    Past Surgical History:  Procedure Laterality Date  . BILATERAL KNEE ARTHROSCOPY       Current Outpatient Prescriptions  Medication Sig Dispense Refill  . amLODipine (NORVASC) 10 MG  tablet TAKE 1 TABLET(10 MG) BY MOUTH DAILY 30 tablet 1  . chlorthalidone (HYGROTON) 25 MG tablet TAKE 1 TABLET(25 MG) BY MOUTH DAILY 30 tablet 1  . lisinopril (PRINIVIL,ZESTRIL) 10 MG tablet Take 1 tablet (10 mg total) by mouth daily. 30 tablet 5  . tadalafil (CIALIS) 5 MG tablet 1 TO 2 TABLETS AS NEEDED 10 tablet 3   No current facility-administered medications for this visit.     Allergies:   Patient has no known allergies.    Social History:  The patient  reports that he has been smoking Cigarettes.  He has never used smokeless tobacco. He reports that he does not drink alcohol or use drugs.   Family History:  The patient's family history includes Hypertension in his father and mother; Stroke in his father.    ROS:  Please see the history of present illness.   Otherwise, review of systems are positive for none.   All other systems are reviewed and negative.    PHYSICAL EXAM: VS:  BP 118/72   Pulse 81   Ht 5\' 9"  (1.753 m)   Wt 78.1 kg (172 lb 3.2 oz)   BMI 25.43 kg/m  , BMI Body mass index is 25.43 kg/m. GENERAL:  Well appearing HEENT:  Pupils equal round and reactive, fundi not visualized, oral mucosa unremarkable NECK:  No jugular venous distention, waveform within normal limits, carotid  upstroke brisk and symmetric, no bruits LYMPHATICS:  No cervical adenopathy LUNGS:  Clear to auscultation bilaterally HEART:  RRR.  PMI not displaced or sustained,S1 and S2 within normal limits, no S3, no S4, no clicks, no rubs, II/VI holosystolic murmur at the apex.   ABD:  Flat, positive bowel sounds normal in frequency in pitch, no bruits, no rebound, no guarding, no midline pulsatile mass, no hepatomegaly, no splenomegaly EXT:  2 plus pulses throughout, no edema, no cyanosis no clubbing SKIN:  No rashes no nodules NEURO:  Cranial nerves II through XII grossly intact, motor grossly intact throughout PSYCH:  Cognitively intact, oriented to person place and time  EKG:  EKG is ordered  today. The ekg ordered 12/15/16 demonstrates sinus rhythm.  Rate 81 bpm.  PACs.  LVH with repolariazation abnormality.   Echo 08/08/16: Study Conclusions  - Left ventricle: The cavity size was normal. There was mild focal   basal hypertrophy of the septum. Systolic function was normal.   The estimated ejection fraction was in the range of 60% to 65%.   Wall motion was normal; there were no regional wall motion   abnormalities. Features are consistent with a pseudonormal left   ventricular filling pattern, with concomitant abnormal relaxation   and increased filling pressure (grade 2 diastolic dysfunction).   Doppler parameters are consistent with elevated ventricular   end-diastolic filling pressure. - Aortic valve: Transvalvular velocity was minimally increased.   There was mild stenosis. There was mild regurgitation. Peak   gradient (S): 25 mm Hg. - Aortic root: The aortic root was normal in size. - Mitral valve: Severely thickened and mildly calcified mitral   valve that is rheumatic in apearance. There is limited leaflet   separation with severe mitral stenosis and moderate   regurgitation. The findings are consistent with severe stenosis.   There was moderate regurgitation. Mean gradient (D): 17 mm Hg.   Peak gradient (D): 40 mm Hg. - Left atrium: The atrium was severely dilated. - Right ventricle: Systolic function was normal. - Tricuspid valve: There was mild regurgitation. - Pulmonic valve: There was no regurgitation. - Pulmonary arteries: Systolic pressure was within the normal   range. - Inferior vena cava: The vessel was normal in size. - Pericardium, extracardiac: There was no pericardial effusion.  Impressions:  - Severely thickened and mildly calcified mitral valve that is   rheumatic in apearance. There is limited leaflet eparation with   severe mitral stenosis and moderate regurgitation.   Recent Labs: 06/01/2016: ALT 24; BUN 13; Creat 0.99; Hemoglobin 13.8;  Platelets 297; Potassium 4.1; Sodium 135; TSH 0.97    Lipid Panel    Component Value Date/Time   CHOL 157 06/01/2016 1756   TRIG 129 06/01/2016 1756   HDL 47 06/01/2016 1756   CHOLHDL 3.3 06/01/2016 1756   VLDL 26 06/01/2016 1756   LDLCALC 84 06/01/2016 1756      Wt Readings from Last 3 Encounters:  12/15/16 78.1 kg (172 lb 3.2 oz)  08/22/16 78 kg (172 lb)  07/05/16 76.1 kg (167 lb 12.8 oz)      ASSESSMENT AND PLAN:  #Rheumatic mitral valve disease: [name] has severe mitral stenosis and moderate mitral regurgitation.  He is currently asymptomatic and very physically active.  He doesn't have any evidence of heart failure.   Repeat echo 06/2017.  Given his moderate mitral regurgitation he would not be a good candidate for balloon valvotomy.   # Hypertension: Blood pressure is initially elevated but 118/72  on repeat. Continue amlodipine, chlorthalidone, and lisinopril.  # Erectile dysfunction: Continue Cialis 5-10mg  prn.  # Tobacco abuse: Nicorette gum was helpful but he was not taking enough.  He was given instructions and will try again.  Smoking cessation was discussed for 5 minutes.   Current medicines are reviewed at length with the patient today.  The patient does not have concerns regarding medicines.  The following changes have been made:    Labs/ tests ordered today include:   Orders Placed This Encounter  Procedures  . EKG 12-Lead  . ECHOCARDIOGRAM COMPLETE     Disposition:   FU with Jowanna Loeffler C. Duke Salvia, MD, [facility] in 6 months.    This note was written with the assistance of speech recognition software.  Please excuse any transcriptional errors.  Signed, Steven Basso C. Duke Salvia, MD, [facility]  12/15/2016 4:47 PM    [facility]

## 2016-12-15 NOTE — Patient Instructions (Addendum)
Medication Instructions:  Your physician recommends that you continue on your current medications as directed. Please refer to the Current Medication list given to you today.  Testing/Procedures: Your physician has requested that you have an echocardiogram in 6 MONTHS. Echocardiography is a painless test that uses sound waves to create images of your heart. It provides your doctor with information about the size and shape of your heart and how well your heart's chambers and valves are working. This procedure takes approximately one hour. There are no restrictions for this procedure.   Follow-Up: Your physician wants you to follow-up in: AFTER ECHO IN 6 MONTHS WITH DR. [place]. You will receive a reminder letter in the mail two months in advance. If you don't receive a letter, please call our office to schedule the follow-up appointment.   Any Other Special Instructions Will Be Listed Below (If Applicable).  Nicorette gum: - Choose 2 mg gum - Chew at least one piece of gum every 1-2 hours while awake or whenever you feel the urge to smoke - Use up to 24 pieced of gum per day for 6 weeks - For the next 6 weeks, gradually reduce the gum until you can stop it completely - When you chew the gum, chew until you start to tasete the nicotine and then hold it between your teeth and your cheek until the taste disappears.  Then chew again until the nicotine taste comes back.  Repeat this for 30 minutes and then throw it away. - Avoid acidic drinks (soda, coffee, carbonated drinks) before and during gum use.    If you need a refill on your cardiac medications before your next appointment, please call your pharmacy.

## 2017-01-02 ENCOUNTER — Ambulatory Visit (INDEPENDENT_AMBULATORY_CARE_PROVIDER_SITE_OTHER): Payer: No Typology Code available for payment source | Admitting: Emergency Medicine

## 2017-01-02 ENCOUNTER — Encounter: Payer: Self-pay | Admitting: Emergency Medicine

## 2017-01-02 ENCOUNTER — Ambulatory Visit (INDEPENDENT_AMBULATORY_CARE_PROVIDER_SITE_OTHER): Payer: No Typology Code available for payment source

## 2017-01-02 VITALS — BP 134/83 | HR 81 | Temp 98.1°F | Resp 16 | Ht 69.0 in | Wt 171.0 lb

## 2017-01-02 DIAGNOSIS — R1013 Epigastric pain: Secondary | ICD-10-CM | POA: Diagnosis not present

## 2017-01-02 DIAGNOSIS — R112 Nausea with vomiting, unspecified: Secondary | ICD-10-CM | POA: Diagnosis not present

## 2017-01-02 MED ORDER — ONDANSETRON HCL 4 MG PO TABS: 4.0000 mg | ORAL_TABLET | Freq: Three times a day (TID) | ORAL | 0 refills | Status: DC | PRN

## 2017-01-02 MED ORDER — RANITIDINE HCL 300 MG PO TABS: 300.0000 mg | ORAL_TABLET | Freq: Every day | ORAL | 1 refills | Status: DC

## 2017-01-02 MED ORDER — OMEPRAZOLE 40 MG PO CPDR: 40.0000 mg | DELAYED_RELEASE_CAPSULE | Freq: Every day | ORAL | 3 refills | Status: DC

## 2017-01-02 NOTE — Patient Instructions (Addendum)
IF you received an x-ray today, you will receive an invoice from [facility]. Please contact [facility] at [phone] with questions or concerns regarding your invoice.   IF you received labwork today, you will receive an invoice from [facility]. Please contact [facility] at [phone] with questions or concerns regarding your invoice.   Our billing staff will not be able to assist you with questions regarding bills from these companies.  You will be contacted with the lab results as soon as they are available. The fastest way to get your results is to activate your My Chart account. Instructions are located on the last page of this paperwork. If you have not heard from [place] regarding the results in 2 weeks, please contact this office.     Abdominal Pain, Adult Many things can cause belly (abdominal) pain. Most times, belly pain is not dangerous. Many cases of belly pain can be watched and treated at home. Sometimes belly pain is serious, though. Your doctor will try to find the cause of your belly pain. Follow these instructions at home:  Take over-the-counter and prescription medicines only as told by your doctor. Do not take medicines that help you poop (laxatives) unless told to by your doctor.  Drink enough fluid to keep your pee (urine) clear or pale yellow.  Watch your belly pain for any changes.  Keep all follow-up visits as told by your doctor. This is important. Contact a doctor if:  Your belly pain changes or gets worse.  You are not hungry, or you lose weight without trying.  You are having trouble pooping (constipated) or have watery poop (diarrhea) for more than 2-3 days.  You have pain when you pee or poop.  Your belly pain wakes you up at night.  Your pain gets worse with meals, after eating, or with certain foods.  You are throwing up and cannot keep anything down.  You have a fever. Get help right away if:  Your pain does not go away  as soon as your doctor says it should.  You cannot stop throwing up.  Your pain is only in areas of your belly, such as the right side or the left lower part of the belly.  You have bloody or black poop, or poop that looks like tar.  You have very bad pain, cramping, or bloating in your belly.  You have signs of not having enough fluid or water in your body (dehydration), such as:  Dark pee, very little pee, or no pee.  Cracked lips.  Dry mouth.  Sunken eyes.  Sleepiness.  Weakness. This information is not intended to replace advice given to you by your health care provider. Make sure you discuss any questions you have with your health care provider. Document Released: 03/14/2008 Document Revised: 04/15/2016 Document Reviewed: 03/09/2016 Elsevier Interactive Patient Education  2017 [employer]  Constipation, Adult Constipation is when a person:  Poops (has a bowel movement) fewer times in a week than normal.  Has a hard time pooping.  Has poop that is dry, hard, or bigger than normal. Follow these instructions at home: Eating and drinking    Eat foods that have a lot of fiber, such as:  Fresh fruits and vegetables.  Whole grains.  Beans.  Eat less of foods that are high in fat, low in fiber, or overly processed, such as:  [place] fries.  Hamburgers.  Cookies.  Candy.  Soda.  Drink enough fluid to keep your pee (  urine) clear or pale yellow. General instructions   Exercise regularly or as told by your doctor.  Go to the restroom when you feel like you need to poop. Do not hold it in.  Take over-the-counter and prescription medicines only as told by your doctor. These include any fiber supplements.  Do pelvic floor retraining exercises, such as:  Doing deep breathing while relaxing your lower belly (abdomen).  Relaxing your pelvic floor while pooping.  Watch your condition for any changes.  Keep all follow-up visits as told by your doctor.  This is important. Contact a doctor if:  You have pain that gets worse.  You have a fever.  You have not pooped for 4 days.  You throw up (vomit).  You are not hungry.  You lose weight.  You are bleeding from the anus.  You have thin, pencil-like poop (stool). Get help right away if:  You have a fever, and your symptoms suddenly get worse.  You leak poop or have blood in your poop.  Your belly feels hard or bigger than normal (is bloated).  You have very bad belly pain.  You feel dizzy or you faint. This information is not intended to replace advice given to you by your health care provider. Make sure you discuss any questions you have with your health care provider. Document Released: 03/14/2008 Document Revised: 04/15/2016 Document Reviewed: 03/16/2016 Elsevier Interactive Patient Education  2017 [employer]

## 2017-01-02 NOTE — Progress Notes (Signed)
Pheng M. Orrego 51 y.o.   Chief Complaint  Patient presents with  . stomach upset    x 1 wk, felt something come up, vomit twice this am, "I feel like pressure"     HISTORY OF PRESENT ILLNESS: This is a 51 y.o. male complaining of upper abdominal pain/fullness x 1-2 weeks; still eating/drinking ok but vomited twice this am.  Abdominal Pain  This is a new problem. The current episode started 1 to 4 weeks ago. The onset quality is gradual. The problem occurs intermittently. The problem has been waxing and waning. The pain is located in the epigastric region, LUQ and RUQ. The pain is at a severity of 4/10. The pain is moderate. The quality of the pain is dull and aching. The abdominal pain radiates to the back. Associated symptoms include constipation. Pertinent negatives include no anorexia, diarrhea, dysuria, fever, frequency, headaches, hematochezia, hematuria, melena, nausea, vomiting or weight loss. Nothing aggravates the pain. The pain is relieved by nothing. He has tried nothing for the symptoms. There is no history of abdominal surgery, Crohn's disease, gallstones, GERD, pancreatitis, PUD or ulcerative colitis.     Prior to Admission medications   Medication Sig Start Date End Date Taking? Authorizing Provider  amLODipine (NORVASC) 10 MG tablet TAKE 1 TABLET(10 MG) BY MOUTH DAILY 10/10/16  Yes Ofilia Neas, PA-C  chlorthalidone (HYGROTON) 25 MG tablet TAKE 1 TABLET(25 MG) BY MOUTH DAILY 11/08/16  Yes Elizabeth Whitney McVey, PA-C  lisinopril (PRINIVIL,ZESTRIL) 10 MG tablet Take 1 tablet (10 mg total) by mouth daily. 07/05/16 12/15/21 Yes Chilton Si, MD  tadalafil (CIALIS) 5 MG tablet 1 TO 2 TABLETS AS NEEDED 08/22/16  Yes Chilton Si, MD    No Known Allergies  Patient Active Problem List   Diagnosis Date Noted  . 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 Hypertension, benign 01/09/2014  . Undiagnosed cardiac murmurs 01/09/2014  . Accelerated  hypertension 03/11/2013  . Family history of stroke 03/11/2013  . Essential hypertension 03/11/2013  . Smoker 03/11/2013  . Dental caries 03/11/2013  . Chronic periodontitis, unspecified 03/11/2013    Past Medical History:  Diagnosis Date  . Hypertension   . 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  Moderate mitral regurgitation.    Past Surgical History:  Procedure Laterality Date  . BILATERAL KNEE ARTHROSCOPY      Social History   Social History  . Marital status: Married    Spouse name: N/A  . Number of children: N/A  . Years of education: N/A   Occupational History  . [employer] account executive    Social History Main Topics  . Smoking status: Current Every Day Smoker    Types: Cigarettes  . Smokeless tobacco: Never Used  . Alcohol use No  . Drug use: No  . Sexual activity: Yes    Partners: Female   Other Topics Concern  . Not on file   Social History Narrative   Pt is married.  He is a former Database administrator.              Family History  Problem Relation Age of Onset  . Hypertension Mother   . Hypertension Father   . Stroke Father      Review of Systems  Constitutional: Negative for fever, malaise/fatigue and weight loss.  HENT: Negative.   Eyes: Negative.   Respiratory: Negative.  Negative for cough, shortness of breath and wheezing.  Cardiovascular: Negative for chest pain, palpitations and leg swelling.  Gastrointestinal: Positive for abdominal pain and constipation. Negative for anorexia, diarrhea, hematochezia, melena, nausea and vomiting.  Genitourinary: Negative for dysuria, frequency and hematuria.  Musculoskeletal: Negative.   Skin: Negative for rash.  Neurological: Negative for sensory change, focal weakness and headaches.  Endo/Heme/Allergies: Negative.   All other systems reviewed and are negative.  Vitals:   01/02/17 1407  BP: 134/83  Pulse: 81  Resp: 16   Temp: 98.1 F (36.7 C)    Physical Exam  Constitutional: He is oriented to person, place, and time. He appears well-developed and well-nourished.  HENT:  Head: Normocephalic and atraumatic.  Nose: Nose normal.  Mouth/Throat: Oropharynx is clear and moist. No oropharyngeal exudate.  Eyes: Conjunctivae and EOM are normal. Pupils are equal, round, and reactive to light.  Neck: Normal range of motion. Neck supple. No JVD present. No thyromegaly present.  Cardiovascular: Normal rate, regular rhythm and normal heart sounds.   Pulmonary/Chest: Effort normal and breath sounds normal.  Abdominal: Soft. Bowel sounds are normal. He exhibits no distension and no mass. There is tenderness (mild epigastric). There is no rebound and no guarding. No hernia.  Musculoskeletal: Normal range of motion.  Lymphadenopathy:    He has no cervical adenopathy.  Neurological: He is alert and oriented to person, place, and time. No sensory deficit. He exhibits normal muscle tone. Coordination normal.  Skin: Skin is warm and dry. No rash noted.  Psychiatric: He has a normal mood and affect. His behavior is normal.  Vitals reviewed.    ASSESSMENT & PLAN: Stephanos was seen today for stomach upset.  Diagnoses and all orders for this visit:  Abdominal pain, epigastric -     CBC with Differential/Platelet -     Comprehensive metabolic panel -     Amylase -     Lipase -     DG Abd Acute W/Chest; Future -     US Abdomen Complete; Future  Non-intractable vomiting with nausea, unspecified vomiting type  Other orders -     ondansetron (ZOFRAN) 4 MG tablet; Take 1 tablet (4 mg total) by mouth every 8 (eight) hours as needed for nausea or vomiting. -     omeprazole (PRILOSEC) 40 MG capsule; Take 1 capsule (40 mg total) by mouth daily. -     ranitidine (ZANTAC) 300 MG tablet; Take 1 tablet (300 mg total) by mouth at bedtime.    Patient Instructions       IF you received an x-ray today, you will receive  an invoice from [facility]. Please contact [facility] at [phone] with questions or concerns regarding your invoice.   IF you received labwork today, you will receive an invoice from [place]. Please contact [facility] at [phone] with questions or concerns regarding your invoice.   Our billing staff will not be able to assist you with questions regarding bills from these companies.  You will be contacted with the lab results as soon as they are available. The fastest way to get your results is to activate your My Chart account. Instructions are located on the last page of this paperwork. If you have not heard from [place] regarding the results in 2 weeks, please contact this office.     Abdominal Pain, Adult Many things can cause belly (abdominal) pain. Most times, belly pain is not dangerous. Many cases of belly pain can be watched and treated at home. Sometimes belly pain is serious, though. Your doctor will  try to find the cause of your belly pain. Follow these instructions at home:  Take over-the-counter and prescription medicines only as told by your doctor. Do not take medicines that help you poop (laxatives) unless told to by your doctor.  Drink enough fluid to keep your pee (urine) clear or pale yellow.  Watch your belly pain for any changes.  Keep all follow-up visits as told by your doctor. This is important. Contact a doctor if:  Your belly pain changes or gets worse.  You are not hungry, or you lose weight without trying.  You are having trouble pooping (constipated) or have watery poop (diarrhea) for more than 2-3 days.  You have pain when you pee or poop.  Your belly pain wakes you up at night.  Your pain gets worse with meals, after eating, or with certain foods.  You are throwing up and cannot keep anything down.  You have a fever. Get help right away if:  Your pain does not go away as soon as your doctor says it should.  You cannot stop  throwing up.  Your pain is only in areas of your belly, such as the right side or the left lower part of the belly.  You have bloody or black poop, or poop that looks like tar.  You have very bad pain, cramping, or bloating in your belly.  You have signs of not having enough fluid or water in your body (dehydration), such as:  Dark pee, very little pee, or no pee.  Cracked lips.  Dry mouth.  Sunken eyes.  Sleepiness.  Weakness. This information is not intended to replace advice given to you by your health care provider. Make sure you discuss any questions you have with your health care provider. Document Released: 03/14/2008 Document Revised: 04/15/2016 Document Reviewed: 03/09/2016 Elsevier Interactive Patient Education  2017 Elsevier Inc.  Constipation, Adult Constipation is when a person:  Poops (has a bowel movement) fewer times in a week than normal.  Has a hard time pooping.  Has poop that is dry, hard, or bigger than normal. Follow these instructions at home: Eating and drinking    Eat foods that have a lot of fiber, such as:  Fresh fruits and vegetables.  Whole grains.  Beans.  Eat less of foods that are high in fat, low in fiber, or overly processed, such as:  [place] fries.  Hamburgers.  Cookies.  Candy.  Soda.  Drink enough fluid to keep your pee (urine) clear or pale yellow. General instructions   Exercise regularly or as told by your doctor.  Go to the restroom when you feel like you need to poop. Do not hold it in.  Take over-the-counter and prescription medicines only as told by your doctor. These include any fiber supplements.  Do pelvic floor retraining exercises, such as:  Doing deep breathing while relaxing your lower belly (abdomen).  Relaxing your pelvic floor while pooping.  Watch your condition for any changes.  Keep all follow-up visits as told by your doctor. This is important. Contact a doctor if:  You have pain  that gets worse.  You have a fever.  You have not pooped for 4 days.  You throw up (vomit).  You are not hungry.  You lose weight.  You are bleeding from the anus.  You have thin, pencil-like poop (stool). Get help right away if:  You have a fever, and your symptoms suddenly get worse.  You leak poop or have blood  in your poop.  Your belly feels hard or bigger than normal (is bloated).  You have very bad belly pain.  You feel dizzy or you faint. This information is not intended to replace advice given to you by your health care provider. Make sure you discuss any questions you have with your health care provider. Document Released: 03/14/2008 Document Revised: 04/15/2016 Document Reviewed: 03/16/2016 Elsevier Interactive Patient Education  2017 Elsevier Inc.      Edwina Barth, MD [facility]

## 2017-01-03 LAB — COMPREHENSIVE METABOLIC PANEL
ALT: 15 IU/L (ref 0–44)
AST: 19 IU/L (ref 0–40)
Albumin/Globulin Ratio: 1.3 (ref 1.2–2.2)
Albumin: 4.5 g/dL (ref 3.5–5.5)
Alkaline Phosphatase: 76 IU/L (ref 39–117)
BUN/Creatinine Ratio: 13 (ref 9–20)
BUN: 12 mg/dL (ref 6–24)
Bilirubin Total: 0.5 mg/dL (ref 0.0–1.2)
CO2: 28 mmol/L (ref 18–29)
Calcium: 10.4 mg/dL — ABNORMAL HIGH (ref 8.7–10.2)
Chloride: 95 mmol/L — ABNORMAL LOW (ref 96–106)
Creatinine, Ser: 0.89 mg/dL (ref 0.76–1.27)
GFR calc Af Amer: 114 mL/min/{1.73_m2} (ref >59)
GFR calc non Af Amer: 98 mL/min/{1.73_m2} (ref >59)
Globulin, Total: 3.6 g/dL (ref 1.5–4.5)
Glucose: 104 mg/dL — ABNORMAL HIGH (ref 65–99)
Potassium: 4.2 mmol/L (ref 3.5–5.2)
Sodium: 139 mmol/L (ref 134–144)
Total Protein: 8.1 g/dL (ref 6.0–8.5)

## 2017-01-03 LAB — CBC WITH DIFFERENTIAL/PLATELET
Basophils Absolute: 0.1 10*3/uL (ref 0.0–0.2)
Basos: 0 %
EOS (ABSOLUTE): 0.1 10*3/uL (ref 0.0–0.4)
Eos: 1 %
Hematocrit: 43.6 % (ref 37.5–51.0)
Hemoglobin: 13.9 g/dL (ref 13.0–17.7)
Immature Grans (Abs): 0.1 10*3/uL (ref 0.0–0.1)
Immature Granulocytes: 1 %
Lymphocytes Absolute: 2.7 10*3/uL (ref 0.7–3.1)
Lymphs: 16 %
MCH: 28.2 pg (ref 26.6–33.0)
MCHC: 31.9 g/dL (ref 31.5–35.7)
MCV: 88 fL (ref 79–97)
Monocytes Absolute: 0.8 10*3/uL (ref 0.1–0.9)
Monocytes: 4 %
Neutrophils Absolute: 13.7 10*3/uL — ABNORMAL HIGH (ref 1.4–7.0)
Neutrophils: 78 %
Platelets: 268 10*3/uL (ref 150–379)
RBC: 4.93 x10E6/uL (ref 4.14–5.80)
RDW: 13 % (ref 12.3–15.4)
WBC: 17.4 10*3/uL — ABNORMAL HIGH (ref 3.4–10.8)

## 2017-01-03 LAB — LIPASE: Lipase: 1847 U/L — ABNORMAL HIGH (ref 13–78)

## 2017-01-03 LAB — AMYLASE: Amylase: 1262 U/L (ref 31–124)

## 2017-01-04 ENCOUNTER — Telehealth: Payer: Self-pay | Admitting: Emergency Medicine

## 2017-01-04 ENCOUNTER — Encounter (HOSPITAL_COMMUNITY): Payer: Self-pay | Admitting: *Deleted

## 2017-01-04 ENCOUNTER — Emergency Department (HOSPITAL_COMMUNITY): Payer: No Typology Code available for payment source

## 2017-01-04 ENCOUNTER — Emergency Department (HOSPITAL_COMMUNITY)
Admission: EM | Admit: 2017-01-04 | Discharge: 2017-01-04 | Disposition: A | Discharge status: Home / Self Care | Payer: No Typology Code available for payment source | Attending: Emergency Medicine | Admitting: Emergency Medicine

## 2017-01-04 DIAGNOSIS — I10 Essential (primary) hypertension: Secondary | ICD-10-CM | POA: Diagnosis not present

## 2017-01-04 DIAGNOSIS — R109 Unspecified abdominal pain: Secondary | ICD-10-CM | POA: Diagnosis present

## 2017-01-04 DIAGNOSIS — F17210 Nicotine dependence, cigarettes, uncomplicated: Secondary | ICD-10-CM | POA: Diagnosis not present

## 2017-01-04 DIAGNOSIS — Z79899 Other long term (current) drug therapy: Secondary | ICD-10-CM | POA: Diagnosis not present

## 2017-01-04 DIAGNOSIS — K859 Acute pancreatitis without necrosis or infection, unspecified: Secondary | ICD-10-CM

## 2017-01-04 LAB — CBC
HCT: 36 % — ABNORMAL LOW (ref 39.0–52.0)
Hemoglobin: 12.1 g/dL — ABNORMAL LOW (ref 13.0–17.0)
MCH: 28.3 pg (ref 26.0–34.0)
MCHC: 33.6 g/dL (ref 30.0–36.0)
MCV: 84.3 fL (ref 78.0–100.0)
Platelets: 238 10*3/uL (ref 150–400)
RBC: 4.27 MIL/uL (ref 4.22–5.81)
RDW: 13 % (ref 11.5–15.5)
WBC: 12.1 10*3/uL — ABNORMAL HIGH (ref 4.0–10.5)

## 2017-01-04 LAB — COMPREHENSIVE METABOLIC PANEL
ALT: 16 U/L — ABNORMAL LOW (ref 17–63)
AST: 22 U/L (ref 15–41)
Albumin: 4 g/dL (ref 3.5–5.0)
Alkaline Phosphatase: 66 U/L (ref 38–126)
Anion gap: 7 (ref 5–15)
BUN: 13 mg/dL (ref 6–20)
CO2: 28 mmol/L (ref 22–32)
Calcium: 9.4 mg/dL (ref 8.9–10.3)
Chloride: 102 mmol/L (ref 101–111)
Creatinine, Ser: 0.91 mg/dL (ref 0.61–1.24)
GFR calc Af Amer: >60 mL/min (ref >60)
GFR calc non Af Amer: >60 mL/min (ref >60)
Glucose, Bld: 104 mg/dL — ABNORMAL HIGH (ref 65–99)
Potassium: 3.5 mmol/L (ref 3.5–5.1)
Sodium: 137 mmol/L (ref 135–145)
Total Bilirubin: 0.6 mg/dL (ref 0.3–1.2)
Total Protein: 8.1 g/dL (ref 6.5–8.1)

## 2017-01-04 LAB — URINALYSIS, ROUTINE W REFLEX MICROSCOPIC
Bilirubin Urine: NEGATIVE
Glucose, UA: NEGATIVE mg/dL
Hgb urine dipstick: NEGATIVE
Ketones, ur: NEGATIVE mg/dL
Nitrite: NEGATIVE
Protein, ur: NEGATIVE mg/dL
Specific Gravity, Urine: 1.026 (ref 1.005–1.030)
pH: 6 (ref 5.0–8.0)

## 2017-01-04 LAB — LIPASE, BLOOD: Lipase: 205 U/L — ABNORMAL HIGH (ref 11–51)

## 2017-01-04 MED ORDER — ONDANSETRON 4 MG PO TBDP: 4.0000 mg | ORAL_TABLET | Freq: Three times a day (TID) | ORAL | 0 refills | Status: DC | PRN

## 2017-01-04 MED ORDER — HYDROCODONE-ACETAMINOPHEN 5-325 MG PO TABS: 1.0000 | ORAL_TABLET | Freq: Two times a day (BID) | ORAL | 0 refills | Status: DC | PRN

## 2017-01-04 NOTE — ED Triage Notes (Signed)
Pt sent by urgent care due to lab work taken on 3/26 showing pancreatitis. Pt complains of upper abdominal pain for the past week. Pt denies n/v/d.

## 2017-01-04 NOTE — Telephone Encounter (Signed)
-----   Message from [facility] L[facility] Sagardia, MD sent at 01/04/2017  8:36 AM EDT ----- Patient has pancreatitis. Needs to go to ER.

## 2017-01-04 NOTE — ED Provider Notes (Signed)
[facility]-EMERGENCY DEPT Provider Note   CSN: [id number] Arrival date & time: 01/04/17  1610     History   Chief Complaint Chief Complaint  Patient presents with  . Pancreatitis    HPI Melvin Burns is a 51 y.o. male.  HPI  Pt comes to the ER with cc of elevated lipase. Pt was seen at urgent care recently, his lipase was elevated, so he was asked to come to the ER. Pt has had abd pain x 1 week, off and on. Pain is getting better, but still pretty strong. Pt has had intermittent nausea, and emesis. Pt has no diarrhea. Pt has never had pain like this before. Pt has no hx of heavy alcohol use, and recent travel hx. Pt is not on any herbal or OTC meds, and he is not on any new meds at all (has hx of HTN). Pt has poor appetite.   Past Medical History:  Diagnosis Date  . Hypertension   . 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  Moderate mitral regurgitation.    Patient Active Problem List   Diagnosis Date Noted  . Abdominal pain, epigastric 01/02/2017  . Non-intractable vomiting with nausea 01/02/2017  . 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 Hypertension, benign 01/09/2014  . Undiagnosed cardiac murmurs 01/09/2014  . Accelerated hypertension 03/11/2013  . Family history of stroke 03/11/2013  . Essential hypertension 03/11/2013  . Smoker 03/11/2013  . Dental caries 03/11/2013  . Chronic periodontitis, unspecified 03/11/2013    Past Surgical History:  Procedure Laterality Date  . BILATERAL KNEE ARTHROSCOPY         Home Medications    Prior to Admission medications   Medication Sig Start Date End Date Taking? Authorizing Provider  amLODipine (NORVASC) 10 MG tablet TAKE 1 TABLET(10 MG) BY MOUTH DAILY 10/10/16  Yes Ofilia Neas, PA-C  chlorthalidone (HYGROTON) 25 MG tablet TAKE 1 TABLET(25 MG) BY MOUTH DAILY 11/08/16  Yes Elizabeth Whitney McVey, PA-C  lisinopril  (PRINIVIL,ZESTRIL) 10 MG tablet Take 1 tablet (10 mg total) by mouth daily. 07/05/16 12/15/21 Yes Chilton Si, MD  omeprazole (PRILOSEC) 40 MG capsule Take 1 capsule (40 mg total) by mouth daily. 01/02/17  Yes Miguel Victorino December, MD  ondansetron (ZOFRAN) 4 MG tablet Take 1 tablet (4 mg total) by mouth every 8 (eight) hours as needed for nausea or vomiting. 01/02/17  Yes Georgina Quint, MD  ranitidine (ZANTAC) 300 MG tablet Take 1 tablet (300 mg total) by mouth at bedtime. 01/02/17 01/16/17 Yes Miguel Victorino December, MD  HYDROcodone-acetaminophen (NORCO/VICODIN) 5-325 MG tablet Take 1 tablet by mouth every 12 (twelve) hours as needed. 01/04/17   Derwood Kaplan, MD  ondansetron (ZOFRAN ODT) 4 MG disintegrating tablet Take 1 tablet (4 mg total) by mouth every 8 (eight) hours as needed for nausea or vomiting. 01/04/17   Derwood Kaplan, MD  tadalafil (CIALIS) 5 MG tablet 1 TO 2 TABLETS AS NEEDED Patient taking differently: Take 5 mg by mouth daily as needed for erectile dysfunction. 1 TO 2 TABLETS AS NEEDED 08/22/16   Chilton Si, MD    Family History Family History  Problem Relation Age of Onset  . Hypertension Mother   . Hypertension Father   . Stroke Father     Social History Social History  Substance Use Topics  . Smoking status: Current Every Day Smoker    Types: Cigarettes  . Smokeless tobacco: Never Used  .  Alcohol use No     Allergies   Patient has no known allergies.   Review of Systems Review of Systems  ROS 10 Systems reviewed and are negative for acute change except as noted in the HPI.     Physical Exam Updated Vital Signs BP 113/61   Pulse 70   Temp 98.3 F (36.8 C) (Oral)   Resp 18   SpO2 100%   Physical Exam  Constitutional: He is oriented to person, place, and time. He appears well-developed.  HENT:  Head: Atraumatic.  Eyes: Conjunctivae are normal. Pupils are equal, round, and reactive to light. No scleral icterus.  Neck: Neck supple.    Cardiovascular: Normal rate.   Pulmonary/Chest: Effort normal.  Abdominal: Soft. There is tenderness. There is no rebound and no guarding.  Neurological: He is alert and oriented to person, place, and time.  Skin: Skin is warm.  Nursing note and vitals reviewed.    ED Treatments / Results  Labs (all labs ordered are listed, but only abnormal results are displayed) Labs Reviewed  LIPASE, BLOOD - Abnormal; Notable for the following:       Result Value   Lipase 205 (*)    All other components within normal limits  COMPREHENSIVE METABOLIC PANEL - Abnormal; Notable for the following:    Glucose, Bld 104 (*)    ALT 16 (*)    All other components within normal limits  CBC - Abnormal; Notable for the following:    WBC 12.1 (*)    Hemoglobin 12.1 (*)    HCT 36.0 (*)    All other components within normal limits  URINALYSIS, ROUTINE W REFLEX MICROSCOPIC - Abnormal; Notable for the following:    Leukocytes, UA SMALL (*)    Bacteria, UA RARE (*)    Squamous Epithelial / LPF 0-5 (*)    All other components within normal limits    EKG  EKG Interpretation None       Radiology [place] Abdomen Limited  Result Date: 01/04/2017 CLINICAL DATA:  Pancreatitis.  Rule out gallstones. EXAM: US ABDOMEN LIMITED - RIGHT UPPER QUADRANT COMPARISON:  None. FINDINGS: Gallbladder: No gallstones or wall thickening visualized. No sonographic Murphy sign noted by sonographer. Common bile duct: Diameter: 3.9 mm Liver: No focal lesion identified. Within normal limits in parenchymal echogenicity. IMPRESSION: Negative right upper quadrant ultrasound Electronically Signed   By: Marlan Palauharles  Clark M.D.   On: 01/04/2017 21:26    Procedures Procedures (including critical care time)  Medications Ordered in ED Medications - No data to display   Initial Impression / Assessment and Plan / ED Course  I have reviewed the triage vital signs and the nursing notes.  Pertinent labs & imaging results that were available  during my care of the patient were reviewed by me and considered in my medical decision making (see chart for details).     Pt comes in with acute pancreatitis. Lipase improved between 3/26 and 3/28. Pt has no hx of alcohol abuse, and we ordered an US - which is neg.  Pain is in control. Pt has no intractable n/v - so we will d/c with pain meds and GI f/u.  Strict ER return precautions have been discussed, and patient is agreeing with the plan and is comfortable with the workup done and the recommendations from the ER.   Final Clinical Impressions(s) / ED Diagnoses   Final diagnoses:  Acute pancreatitis, unspecified complication status, unspecified pancreatitis type    New Prescriptions Discharge Medication  List as of 01/04/2017 11:07 PM       Derwood Kaplan, MD 01/04/17 1610

## 2017-01-04 NOTE — Discharge Instructions (Signed)
YOU HAVE PANCREATITIS - please take the medicine prescribed for pain. See the Gastroenterology doctor as requested for further evaluation.  Please return to the ER if your symptoms worsen; you have increased pain, fevers, chills, inability to keep any medications down, confusion. Otherwise see the outpatient doctor as requested.

## 2017-04-03 ENCOUNTER — Other Ambulatory Visit: Payer: Self-pay | Admitting: Physician Assistant

## 2017-04-03 DIAGNOSIS — I10 Essential (primary) hypertension: Secondary | ICD-10-CM

## 2017-06-15 ENCOUNTER — Other Ambulatory Visit: Payer: Self-pay | Admitting: Cardiovascular Disease

## 2017-06-15 ENCOUNTER — Other Ambulatory Visit: Payer: Self-pay | Admitting: Physician Assistant

## 2017-06-15 DIAGNOSIS — I10 Essential (primary) hypertension: Secondary | ICD-10-CM

## 2017-06-15 NOTE — Telephone Encounter (Signed)
Please review for refill, thanks ! 

## 2017-06-22 ENCOUNTER — Ambulatory Visit (HOSPITAL_COMMUNITY): Payer: No Typology Code available for payment source | Attending: Cardiovascular Disease

## 2017-06-22 ENCOUNTER — Other Ambulatory Visit: Payer: Self-pay

## 2017-06-22 DIAGNOSIS — Z720 Tobacco use: Secondary | ICD-10-CM | POA: Diagnosis not present

## 2017-06-22 DIAGNOSIS — I371 Nonrheumatic pulmonary valve insufficiency: Secondary | ICD-10-CM | POA: Insufficient documentation

## 2017-06-22 DIAGNOSIS — I052 Rheumatic mitral stenosis with insufficiency: Secondary | ICD-10-CM | POA: Diagnosis present

## 2017-06-22 DIAGNOSIS — I083 Combined rheumatic disorders of mitral, aortic and tricuspid valves: Secondary | ICD-10-CM | POA: Diagnosis not present

## 2017-06-22 DIAGNOSIS — I10 Essential (primary) hypertension: Secondary | ICD-10-CM | POA: Diagnosis not present

## 2017-06-27 ENCOUNTER — Other Ambulatory Visit: Payer: Self-pay

## 2017-06-27 DIAGNOSIS — I10 Essential (primary) hypertension: Secondary | ICD-10-CM

## 2017-06-27 MED ORDER — AMLODIPINE BESYLATE 10 MG PO TABS: 10.0000 mg | ORAL_TABLET | Freq: Every day | ORAL | 0 refills | Status: DC

## 2017-07-04 ENCOUNTER — Other Ambulatory Visit: Payer: Self-pay | Admitting: *Deleted

## 2017-07-04 DIAGNOSIS — I10 Essential (primary) hypertension: Secondary | ICD-10-CM

## 2017-07-04 MED ORDER — CHLORTHALIDONE 25 MG PO TABS: 25.0000 mg | ORAL_TABLET | Freq: Every day | ORAL | 3 refills | Status: DC

## 2017-08-15 ENCOUNTER — Ambulatory Visit (INDEPENDENT_AMBULATORY_CARE_PROVIDER_SITE_OTHER): Payer: No Typology Code available for payment source | Admitting: Cardiovascular Disease

## 2017-08-15 ENCOUNTER — Encounter: Payer: Self-pay | Admitting: Cardiovascular Disease

## 2017-08-15 VITALS — BP 125/75 | HR 76 | Ht 69.0 in | Wt 176.4 lb

## 2017-08-15 DIAGNOSIS — I350 Nonrheumatic aortic (valve) stenosis: Secondary | ICD-10-CM

## 2017-08-15 DIAGNOSIS — I050 Rheumatic mitral stenosis: Secondary | ICD-10-CM | POA: Diagnosis not present

## 2017-08-15 DIAGNOSIS — I340 Nonrheumatic mitral (valve) insufficiency: Secondary | ICD-10-CM

## 2017-08-15 DIAGNOSIS — I10 Essential (primary) hypertension: Secondary | ICD-10-CM | POA: Diagnosis not present

## 2017-08-15 DIAGNOSIS — Z720 Tobacco use: Secondary | ICD-10-CM | POA: Diagnosis not present

## 2017-08-15 HISTORY — DX: Rheumatic mitral stenosis: I05.0

## 2017-08-15 HISTORY — DX: Nonrheumatic aortic (valve) stenosis: I35.0

## 2017-08-15 HISTORY — DX: Nonrheumatic mitral (valve) insufficiency: I34.0

## 2017-08-15 NOTE — Patient Instructions (Signed)

## 2017-08-15 NOTE — Progress Notes (Signed)
Cardiology Office Note   Date:  08/15/2017   ID:  Melvin Burns, DOB May 02, 1966, MRN [id number]  PCP:  Melvin AngstJegede, Olugbemiga E, MD  Cardiologist:   Chilton Siiffany [place], MD   Chief Complaint  Patient presents with  . Follow-up    History of Present Illness: Melvin Burns is a 51 y.o. male with Rheumatic mitral valve disease, severe mitral stenosis and moderate mitral regurgitation, mild aortic stenosis, hypertension and tobacco abuse who presents for follow up.  Melvin Burns was first seen 07/05/16 due to an abnormal EKG.  He saw his PCP on 8/23 and was noted to have PACs as well as a PVC. He was also noted to have LVH with repolarization abnormality.  He was referred to cardiology for further evaluation.  At that appointment he was noted to have a murmur on exam.  He had an echo 08/08/16 that showed LVEF 60-65%.  He had severe mitral stenosis with a mean gradient of 17 mmHg and moderate mitral regurgitation.  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  His blood presure was elevated and lisinopril was added to his regimen.    Since his last appointment Mr. has been doing well.  He has no chest pain or shortness of breath.[place] he exercises by walking on the treadmill.  He also does a lot of heavy lifting and is very active at work.  He has no exertional dyspnea.  He also denies lower extremity edema, orthopnea, or PND.  He has not noted any palpitations, lightheadedness or dizziness.  His only complaint is that he occasionally gets an electric shock going down his left leg.    Past Medical History:  Diagnosis Date  . Hypertension   . Mild aortic stenosis 08/15/2017  . Mitral valve stenosis, severe 08/15/2017  . Moderate mitral regurgitation 08/15/2017  . 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  Moderate mitral regurgitation.    Past Surgical History:  Procedure Laterality Date  . BILATERAL KNEE  ARTHROSCOPY       Current Outpatient Medications  Medication Sig Dispense Refill  . amLODipine (NORVASC) 10 MG tablet Take 1 tablet (10 mg total) by mouth daily. 90 tablet 0  . chlorthalidone (HYGROTON) 25 MG tablet Take 1 tablet (25 mg total) by mouth daily. 90 tablet 3  . HYDROcodone-acetaminophen (NORCO/VICODIN) 5-325 MG tablet Take 1 tablet by mouth every 12 (twelve) hours as needed. 15 tablet 0  . lisinopril (PRINIVIL,ZESTRIL) 10 MG tablet TAKE 1 TABLET(10 MG) BY MOUTH DAILY 30 tablet 1  . omeprazole (PRILOSEC) 40 MG capsule Take 1 capsule (40 mg total) by mouth daily. 30 capsule 3  . ondansetron (ZOFRAN ODT) 4 MG disintegrating tablet Take 1 tablet (4 mg total) by mouth every 8 (eight) hours as needed for nausea or vomiting. 20 tablet 0  . ondansetron (ZOFRAN) 4 MG tablet Take 1 tablet (4 mg total) by mouth every 8 (eight) hours as needed for nausea or vomiting. 20 tablet 0  . tadalafil (CIALIS) 5 MG tablet 1 TO 2 TABLETS AS NEEDED (Patient taking differently: Take 5 mg by mouth daily as needed for erectile dysfunction. 1 TO 2 TABLETS AS NEEDED) 10 tablet 3  . ranitidine (ZANTAC) 300 MG tablet Take 1 tablet (300 mg total) by mouth at bedtime. 14 tablet 1   No current facility-administered medications for this visit.     Allergies:   Patient has no known allergies.  Social History:  The patient  reports that he has been smoking cigarettes.  he has never used smokeless tobacco. He reports that he does not drink alcohol or use drugs.   Family History:  The patient's family history includes Hypertension in his father and mother; Stroke in his father.    ROS:  Please see the history of present illness.   Otherwise, review of systems are positive for none.   All other systems are reviewed and negative.    PHYSICAL EXAM: VS:  BP 125/75   Pulse 76   Ht 5\' 9"  (1.753 m)   Wt 80 kg (176 lb 6.4 oz)   BMI 26.05 kg/m  , BMI Body mass index is 26.05 kg/m. GENERAL:  Well  appearing HEENT: Pupils equal round and reactive, fundi not visualized, oral mucosa unremarkable NECK:  No jugular venous distention, waveform within normal limits, carotid upstroke brisk and symmetric, no bruits, no thyromegaly LYMPHATICS:  No cervical adenopathy LUNGS:  Clear to auscultation bilaterally HEART:  RRR.  PMI not displaced or sustained,S1 and S2 within normal limits, no S3, no S4, no clicks, no rubs, II/VI holosystolic murmur at the apex.  III/VI systolic murmur at the LUSB. ABD:  Flat, positive bowel sounds normal in frequency in pitch, no bruits, no rebound, no guarding, no midline pulsatile mass, no hepatomegaly, no splenomegaly EXT:  2 plus pulses throughout, no edema, no cyanosis no clubbing SKIN:  No rashes no nodules NEURO:  Cranial nerves II through XII grossly intact, motor grossly intact throughout PSYCH:  Cognitively intact, oriented to person place and time   EKG:  EKG is ordered today. The ekg ordered 12/15/16 demonstrates sinus rhythm.  Rate 81 bpm.  PACs.  LVH with repolariazation abnormality.  08/15/17: Sinus rhythm.  Rate 76 bpm.  LVH with secondary repolarization abnormality.  Echo 08/08/16: Study Conclusions  - Left ventricle: The cavity size was normal. There was mild focal   basal hypertrophy of the septum. Systolic function was normal.   The estimated ejection fraction was in the range of 60% to 65%.   Wall motion was normal; there were no regional wall motion   abnormalities. Features are consistent with a pseudonormal left   ventricular filling pattern, with concomitant abnormal relaxation   and increased filling pressure (grade 2 diastolic dysfunction).   Doppler parameters are consistent with elevated ventricular   end-diastolic filling pressure. - Aortic valve: Transvalvular velocity was minimally increased.   There was mild stenosis. There was mild regurgitation. Peak   gradient (S): 25 mm Hg. - Aortic root: The aortic root was normal in  size. - Mitral valve: Severely thickened and mildly calcified mitral   valve that is rheumatic in apearance. There is limited leaflet   separation with severe mitral stenosis and moderate   regurgitation. The findings are consistent with severe stenosis.   There was moderate regurgitation. Mean gradient (D): 17 mm Hg.   Peak gradient (D): 40 mm Hg. - Left atrium: The atrium was severely dilated. - Right ventricle: Systolic function was normal. - Tricuspid valve: There was mild regurgitation. - Pulmonic valve: There was no regurgitation. - Pulmonary arteries: Systolic pressure was within the normal   range. - Inferior vena cava: The vessel was normal in size. - Pericardium, extracardiac: There was no pericardial effusion.  Impressions:  - Severely thickened and mildly calcified mitral valve that is   rheumatic in apearance. There is limited leaflet eparation with   severe mitral stenosis and moderate regurgitation.  Recent Labs: 01/04/2017: ALT 16; BUN 13; Creatinine, Ser 0.91; Hemoglobin 12.1; Platelets 238; Potassium 3.5; Sodium 137    Lipid Panel    Component Value Date/Time   CHOL 157 06/01/2016 1756   TRIG 129 06/01/2016 1756   HDL 47 06/01/2016 1756   CHOLHDL 3.3 06/01/2016 1756   VLDL 26 06/01/2016 1756   LDLCALC 84 06/01/2016 1756      Wt Readings from Last 3 Encounters:  08/15/17 80 kg (176 lb 6.4 oz)  01/02/17 77.6 kg (171 lb)  12/15/16 78.1 kg (172 lb 3.2 oz)      ASSESSMENT AND PLAN:  #Rheumatic valve disease: # Severe mitral stenosis: # Moderate mitral regurgitation: # Mild aortic stenosis: Melvin Burns has severe mitral stenosis and moderate mitral regurgitation.   He remains asymptomatic.  We will continue to follow him closely.  Echo unchanged 06/2017.  Given his moderate mitral regurgitation he would not be a good candidate for balloon valvotomy.   # Hypertension: Blood pressure well-controlled.  Continue amlodipine, chlorthalidone, and  lisinopril.  # Tobacco abuse:  Encouraged cessation.   Current medicines are reviewed at length with the patient today.  The patient does not have concerns regarding medicines.  The following changes have been made:    Labs/ tests ordered today include:   No orders of the defined types were placed in this encounter.    Disposition:   FU with Jawad Wiacek C. Duke Salviaandolph, MD, [facility] in 6 months.    This note was written with the assistance of speech recognition software.  Please excuse any transcriptional errors.  Signed, Cailan Antonucci C. Duke Salviaandolph, MD, [facility]  08/15/2017 4:48 PM    [facility]

## 2017-08-28 ENCOUNTER — Other Ambulatory Visit: Payer: Self-pay | Admitting: Cardiovascular Disease

## 2017-08-28 NOTE — Telephone Encounter (Signed)
Please review for refill, Thanks !  

## 2017-12-18 NOTE — Progress Notes (Signed)
This encounter was created in error - please disregard. This encounter was created in error - please disregard. This encounter was created in error - please disregard. 

## 2018-01-23 ENCOUNTER — Other Ambulatory Visit: Payer: Self-pay | Admitting: Cardiovascular Disease

## 2018-01-23 DIAGNOSIS — I10 Essential (primary) hypertension: Secondary | ICD-10-CM

## 2018-01-23 NOTE — Telephone Encounter (Signed)
Please review for refill, Thanks !  

## 2018-02-03 ENCOUNTER — Other Ambulatory Visit: Payer: Self-pay

## 2018-02-03 ENCOUNTER — Emergency Department (HOSPITAL_COMMUNITY): Payer: No Typology Code available for payment source

## 2018-02-03 ENCOUNTER — Encounter (HOSPITAL_COMMUNITY): Payer: Self-pay

## 2018-02-03 ENCOUNTER — Emergency Department (HOSPITAL_COMMUNITY)
Admission: EM | Admit: 2018-02-03 | Discharge: 2018-02-03 | Disposition: A | Discharge status: Home / Self Care | Payer: No Typology Code available for payment source | Attending: Emergency Medicine | Admitting: Emergency Medicine

## 2018-02-03 DIAGNOSIS — L03019 Cellulitis of unspecified finger: Secondary | ICD-10-CM

## 2018-02-03 DIAGNOSIS — Z79899 Other long term (current) drug therapy: Secondary | ICD-10-CM | POA: Insufficient documentation

## 2018-02-03 DIAGNOSIS — F17210 Nicotine dependence, cigarettes, uncomplicated: Secondary | ICD-10-CM | POA: Diagnosis not present

## 2018-02-03 DIAGNOSIS — I10 Essential (primary) hypertension: Secondary | ICD-10-CM | POA: Diagnosis not present

## 2018-02-03 DIAGNOSIS — L03011 Cellulitis of right finger: Secondary | ICD-10-CM | POA: Insufficient documentation

## 2018-02-03 DIAGNOSIS — Z23 Encounter for immunization: Secondary | ICD-10-CM | POA: Diagnosis not present

## 2018-02-03 DIAGNOSIS — R2231 Localized swelling, mass and lump, right upper limb: Secondary | ICD-10-CM | POA: Diagnosis present

## 2018-02-03 MED ORDER — TRAMADOL HCL 50 MG PO TABS: 50.0000 mg | ORAL_TABLET | Freq: Four times a day (QID) | ORAL | 0 refills | Status: DC | PRN

## 2018-02-03 MED ORDER — LIDOCAINE HCL (PF) 1 % IJ SOLN
5.0000 mL | Freq: Once | INTRAMUSCULAR | Status: AC
  Administered 2018-02-03: 5 mL
  Filled 2018-02-03: qty 30

## 2018-02-03 MED ORDER — DOXYCYCLINE HYCLATE 100 MG PO CAPS: 100.0000 mg | ORAL_CAPSULE | Freq: Two times a day (BID) | ORAL | 0 refills | Status: DC

## 2018-02-03 MED ORDER — DOXYCYCLINE HYCLATE 100 MG PO TABS
100.0000 mg | ORAL_TABLET | Freq: Once | ORAL | Status: AC
  Administered 2018-02-03: 100 mg via ORAL
  Filled 2018-02-03: qty 1

## 2018-02-03 MED ORDER — TETANUS-DIPHTH-ACELL PERTUSSIS 5-2.5-18.5 LF-MCG/0.5 IM SUSP
0.5000 mL | Freq: Once | INTRAMUSCULAR | Status: AC
  Administered 2018-02-03: 0.5 mL via INTRAMUSCULAR
  Filled 2018-02-03: qty 0.5

## 2018-02-03 NOTE — ED Provider Notes (Signed)
[id number] Provider Note   CSN: [id number] Arrival date & time: 02/03/18  1653     History   Chief Complaint Chief Complaint  Patient presents with  . thumb swelling    HPI Melvin Burns is a 52 y.o. male who presents to the ED with c/o right thumb pain. Patient reports that 2 weeks ago he was putting something on a shelf and the box was on the top of his right thumb causing an injury. Patient noted a small cut at that time but didn't pay much attention. Since then the area has gotten red, tender and swollen. There has been no drainage.  HPI  Past Medical History:  Diagnosis Date  . Hypertension   . Mild aortic stenosis 08/15/2017  . Mitral valve stenosis, severe 08/15/2017  . Moderate mitral regurgitation 08/15/2017  . 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  Moderate mitral regurgitation.    Patient Active Problem List   Diagnosis Date Noted  . Mitral valve stenosis, severe 08/15/2017  . Moderate mitral regurgitation 08/15/2017  . Mild aortic stenosis 08/15/2017  . Abdominal pain, epigastric 01/02/2017  . Non-intractable vomiting with nausea 01/02/2017  . 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 Hypertension, benign 01/09/2014  . Undiagnosed cardiac murmurs 01/09/2014  . Accelerated hypertension 03/11/2013  . Family history of stroke 03/11/2013  . Essential hypertension 03/11/2013  . Smoker 03/11/2013  . Dental caries 03/11/2013  . Chronic periodontitis, unspecified 03/11/2013    Past Surgical History:  Procedure Laterality Date  . BILATERAL KNEE ARTHROSCOPY          Home Medications    Prior to Admission medications   Medication Sig Start Date End Date Taking? Authorizing Provider  amLODipine (NORVASC) 10 MG tablet TAKE 1 TABLET(10 MG) BY MOUTH DAILY 01/24/18   Chilton Si, MD  chlorthalidone (HYGROTON) 25 MG  tablet Take 1 tablet (25 mg total) by mouth daily. 07/04/17   Chilton Si, MD  doxycycline (VIBRAMYCIN) 100 MG capsule Take 1 capsule (100 mg total) by mouth 2 (two) times daily. 02/03/18   Janne Napoleon, NP  HYDROcodone-acetaminophen (NORCO/VICODIN) 5-325 MG tablet Take 1 tablet by mouth every 12 (twelve) hours as needed. 01/04/17   Derwood Kaplan, MD  lisinopril (PRINIVIL,ZESTRIL) 10 MG tablet TAKE 1 TABLET(10 MG) BY MOUTH DAILY 08/29/17   Chilton Si, MD  omeprazole (PRILOSEC) 40 MG capsule Take 1 capsule (40 mg total) by mouth daily. 01/02/17   Georgina Quint, MD  ondansetron (ZOFRAN ODT) 4 MG disintegrating tablet Take 1 tablet (4 mg total) by mouth every 8 (eight) hours as needed for nausea or vomiting. 01/04/17   Derwood Kaplan, MD  ondansetron (ZOFRAN) 4 MG tablet Take 1 tablet (4 mg total) by mouth every 8 (eight) hours as needed for nausea or vomiting. 01/02/17   Georgina Quint, MD  ranitidine (ZANTAC) 300 MG tablet Take 1 tablet (300 mg total) by mouth at bedtime. 01/02/17 01/16/17  Georgina Quint, MD  tadalafil (CIALIS) 5 MG tablet 1 TO 2 TABLETS AS NEEDED Patient taking differently: Take 5 mg by mouth daily as needed for erectile dysfunction. 1 TO 2 TABLETS AS NEEDED 08/22/16   Chilton Si, MD  traMADol (ULTRAM) 50 MG tablet Take 1 tablet (50 mg total) by mouth every 6 (six) hours as needed. 02/03/18   Janne Napoleon, NP    Family History Family History  Problem Relation  Age of Onset  . Hypertension Mother   . Hypertension Father   . Stroke Father     Social History Social History   Tobacco Use  . Smoking status: Current Every Day Smoker    Packs/day: 1.00    Types: Cigarettes  . Smokeless tobacco: Never Used  Substance Use Topics  . Alcohol use: No  . Drug use: No     Allergies   Patient has no known allergies.   Review of Systems Review of Systems  Musculoskeletal: Positive for arthralgias.  Skin: Positive for wound.  All other  systems reviewed and are negative.    Physical Exam Updated Vital Signs BP (!) 137/99 (BP Location: Left Arm)   Pulse 88   Temp 98.5 F (36.9 C) (Oral)   Resp 16   Ht  (1.753 m)   Wt 77.1 kg (170 lb)   SpO2 96%   BMI 25.10 kg/m   Physical Exam  Constitutional: He appears well-developed and well-nourished. No distress.  HENT:  Head: Normocephalic.  Eyes: EOM are normal.  Neck: Neck supple.  Cardiovascular: Normal rate.  Pulmonary/Chest: Effort normal.  Musculoskeletal: Normal range of motion.       Right hand: He exhibits tenderness and swelling. He exhibits normal range of motion and normal capillary refill. Normal sensation noted. Normal strength noted. He exhibits no thumb/finger opposition.  Distal aspect of the right thumb is swollen, tender with erythema noted.   Neurological: He is alert.  Skin: Skin is warm and dry.  Psychiatric: He has a normal mood and affect.  Nursing note and vitals reviewed.    ED Treatments / Results  Labs (all labs ordered are listed, but only abnormal results are displayed) Labs Reviewed - No data to display  EKG None  Radiology Dg Finger Thumb Right  Result Date: 02/03/2018 CLINICAL DATA:  Swelling and pain in the right thumb x2 weeks without known injury. EXAM: RIGHT THUMB 2+V COMPARISON:  None. FINDINGS: There is no evidence of fracture or dislocation. Mild degenerative change of the first MCP with slight flattened appearance of the first metacarpal head and minimal spurring noted. Soft tissues are unremarkable IMPRESSION: Osteoarthritic change of the first MCP. No acute fracture or malalignment. No bone destruction. Electronically Signed   By: Tollie Eth M.D.   On: 02/03/2018 19:43    Procedures .Nerve Block Date/Time: 02/03/2018 10:10 PM Performed by: Janne Napoleon, NP Authorized by: Janne Napoleon, NP   Consent:    Consent obtained:  Verbal   Consent given by:  Patient   Risks discussed:  Swelling, pain and  unsuccessful block Indications:    Indications:  Pain relief and procedural anesthesia Location:    Body area:  Upper extremity (right thumb)   Laterality:  Right Pre-procedure details:    Skin preparation:  Povidone-iodine Procedure details (see MAR for exact dosages):    Block needle gauge:  25 G   Anesthetic injected:  Lidocaine 1% w/o epi   Steroid injected:  None   Additive injected:  None   Injection procedure:  Incremental injection, negative aspiration for blood and anatomic landmarks palpated Post-procedure details:    Patient tolerance of procedure:  Tolerated well, no immediate complications Comments:     Only partial anesthesia achieved. Dr. Hyacinth Meeker in to do additional block.  Marland Kitchen.Incision and Drainage Date/Time: 02/03/2018 11:30 PM Performed by: Janne Napoleon, NP Authorized by: Janne Napoleon, NP   Consent:    Consent obtained:  Verbal  Consent given by:  Patient   Risks discussed:  Bleeding, incomplete drainage and pain   Alternatives discussed:  Alternative treatment Location:    Indications for incision and drainage: fingertip infection.   Location:  Upper extremity   Upper extremity location:  Finger   Finger location:  R thumb Pre-procedure details:    Skin preparation:  Betadine Procedure type:    Complexity:  Complex Procedure details:    Needle aspiration: no     Incision types:  Single straight   Scalpel blade:  11   Wound management:  Probed and deloculated and irrigated with saline   Drainage:  Purulent and bloody   Drainage amount:  Moderate   Wound treatment:  Drain placed   Packing materials:  1/4 in gauze Post-procedure details:    Patient tolerance of procedure:  Tolerated well, no immediate complications   (including critical care time)  Medications Ordered in ED Medications  lidocaine (PF) (XYLOCAINE) 1 % injection 5 mL (has no administration in time range)  doxycycline (VIBRA-TABS) tablet 100 mg (has no administration in time range)    Tdap (BOOSTRIX) injection 0.5 mL (0.5 mLs Intramuscular Given 02/03/18 2025)     Initial Impression / Assessment and Plan / ED Course  I have reviewed the triage vital signs and the nursing notes. 52 y.o. male here with pain and swelling to the right thumb stable for d/c after I&D. Will start antibiotics and patient to f/u in 2 days for wound check and packing removal. He will return sooner for any problems. X-ray without signs of bone infection.   Final Clinical Impressions(s) / ED Diagnoses   Final diagnoses:  Felon of finger    ED Discharge Orders        Ordered    doxycycline (VIBRAMYCIN) 100 MG capsule  2 times daily     02/03/18 2327    traMADol (ULTRAM) 50 MG tablet  Every 6 hours PRN     02/03/18 2327       Kerrie Buffalo [place], NP 02/03/18 2334    Eber Hong, MD 02/04/18 1452

## 2018-02-03 NOTE — ED Provider Notes (Signed)
The patient has had progressive swelling of his right thumb over the last couple of weeks, on exam he does not fact have a swollen out of the right thumb which is tender and tense consistent with a felon.  He will need to undergo incision and drainage.  Nurse practitioner Ms. Hope niece will take care of this.  Patient will be stable for discharge on antibiotic  Medical screening examination/treatment/procedure(s) were conducted as a shared visit with non-physician practitioner(s) and myself.  I personally evaluated the patient during the encounter.  Clinical Impression:   Final diagnoses:  Felon of finger         Eber Hong, MD 02/04/18 1452

## 2018-02-03 NOTE — Discharge Instructions (Signed)
Do not take the narcotic if driving because it will make you sleepy. Return in 2 days for wound check and packing removal. Return sooner for any problems.

## 2018-02-03 NOTE — ED Triage Notes (Signed)
Patient states he was putting something on a shelf and the box was on top of his right thumb x 2 weeks ago.. Patient has swelling and pain to the right thimb

## 2018-06-06 ENCOUNTER — Ambulatory Visit (INDEPENDENT_AMBULATORY_CARE_PROVIDER_SITE_OTHER): Payer: No Typology Code available for payment source | Admitting: Cardiovascular Disease

## 2018-06-06 ENCOUNTER — Encounter: Payer: Self-pay | Admitting: Cardiovascular Disease

## 2018-06-06 VITALS — BP 132/74 | HR 73 | Ht 69.0 in | Wt 176.0 lb

## 2018-06-06 DIAGNOSIS — I10 Essential (primary) hypertension: Secondary | ICD-10-CM

## 2018-06-06 DIAGNOSIS — I350 Nonrheumatic aortic (valve) stenosis: Secondary | ICD-10-CM

## 2018-06-06 DIAGNOSIS — Z720 Tobacco use: Secondary | ICD-10-CM

## 2018-06-06 DIAGNOSIS — I050 Rheumatic mitral stenosis: Secondary | ICD-10-CM | POA: Diagnosis not present

## 2018-06-06 NOTE — Patient Instructions (Addendum)
Medication Instructions:  Your physician recommends that you continue on your current medications as directed. Please refer to the Current Medication list given to you today.  Labwork: CMET SOON  Testing/Procedures: Your physician has requested that you have an echocardiogram. Echocardiography is a painless test that uses sound waves to create images of your heart. It provides your doctor with information about the size and shape of your heart and how well your heart's chambers and valves are working. This procedure takes approximately one hour. There are no restrictions for this procedure. 6 MONTHS  [facility] AT [address]  Follow-Up: Your physician wants you to follow-up in: AFTER ECHO  You will receive a reminder letter in the mail two months in advance. If you don't receive a letter, please call our office to schedule the follow-up appointment.  [facility]   If you need a refill on your cardiac medications before your next appointment, please call your pharmacy.

## 2018-06-06 NOTE — Progress Notes (Signed)
Cardiology Office Note   Date:  06/06/2018   ID:  Melvin Burns, DOB [birth year], MRN [id number]  PCP:  Quentin Angst, MD  Cardiologist:   Chilton Si, MD   Chief Complaint  Patient presents with  . Follow-up    6 months    History of Present Illness: Melvin Burns is a 52 y.o. male with Rheumatic mitral valve disease, severe mitral stenosis and moderate mitral regurgitation, mild aortic stenosis, hypertension and tobacco abuse who presents for follow up.  Melvin Burns was first seen 07/05/16 due to an abnormal EKG.  He saw his PCP on 8/23 and was noted to have PACs as well as a PVC. He was also noted to have LVH with repolarization abnormality.  He was referred to cardiology for further evaluation.  At that appointment he was noted to have a murmur on exam.  He had an echo 08/08/16 that showed LVEF 60-65%.  He had severe mitral stenosis with a mean gradient of 17 mmHg and moderate mitral regurgitation.  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  His blood presure was elevated and lisinopril was added to his regimen.    Since his last appointment Melvin Burns has been doing well.  He goes to the gym 5-7 days per week.  He works out for 2 hours doing cardio and lifting weights.  He has no chest pain or shortness of breath with this activity.  He hasn't experienced any edema, orthopnea or PND.  He can't jog due to pain on his R foot but has no other physical limitations.  He has been smoking 1/2 pack of cigarettes daily.  He previously tried nicorette gum but was using one piece per day and it wasn't effective.    Past Medical History:  Diagnosis Date  . Hypertension   . Mild aortic stenosis 08/15/2017  . Mitral valve stenosis, severe 08/15/2017  . Moderate mitral regurgitation 08/15/2017  . 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  Moderate mitral regurgitation.    Past Surgical  History:  Procedure Laterality Date  . BILATERAL KNEE ARTHROSCOPY       Current Outpatient Medications  Medication Sig Dispense Refill  . amLODipine (NORVASC) 10 MG tablet TAKE 1 TABLET(10 MG) BY MOUTH DAILY 90 tablet 0  . chlorthalidone (HYGROTON) 25 MG tablet Take 1 tablet (25 mg total) by mouth daily. 90 tablet 3  . doxycycline (VIBRAMYCIN) 100 MG capsule Take 1 capsule (100 mg total) by mouth 2 (two) times daily. 14 capsule 0  . HYDROcodone-acetaminophen (NORCO/VICODIN) 5-325 MG tablet Take 1 tablet by mouth every 12 (twelve) hours as needed. 15 tablet 0  . lisinopril (PRINIVIL,ZESTRIL) 10 MG tablet TAKE 1 TABLET(10 MG) BY MOUTH DAILY 90 tablet 3  . omeprazole (PRILOSEC) 40 MG capsule Take 1 capsule (40 mg total) by mouth daily. 30 capsule 3  . ondansetron (ZOFRAN ODT) 4 MG disintegrating tablet Take 1 tablet (4 mg total) by mouth every 8 (eight) hours as needed for nausea or vomiting. 20 tablet 0  . ondansetron (ZOFRAN) 4 MG tablet Take 1 tablet (4 mg total) by mouth every 8 (eight) hours as needed for nausea or vomiting. 20 tablet 0  . ranitidine (ZANTAC) 300 MG tablet Take 1 tablet (300 mg total) by mouth at bedtime. 14 tablet 1  . tadalafil (CIALIS) 5 MG tablet 1 TO 2 TABLETS AS NEEDED (Patient taking differently: Take 5 mg  by mouth daily as needed for erectile dysfunction. 1 TO 2 TABLETS AS NEEDED) 10 tablet 3  . traMADol (ULTRAM) 50 MG tablet Take 1 tablet (50 mg total) by mouth every 6 (six) hours as needed. 6 tablet 0   No current facility-administered medications for this visit.     Allergies:   Patient has no known allergies.    Social History:  The patient  reports that he has been smoking cigarettes. He has been smoking about 1.00 pack per day. He has never used smokeless tobacco. He reports that he does not drink alcohol or use drugs.   Family History:  The patient's family history includes Hypertension in his father and mother; Stroke in his father.    ROS:  Please  see the history of present illness.   Otherwise, review of systems are positive for none.   All other systems are reviewed and negative.    PHYSICAL EXAM: VS:  BP 132/74 (BP Location: Left Arm, Patient Position: Sitting, Cuff Size: Normal)   Pulse 73   Ht 5\' 9"  (1.753 m)   Wt 176 lb (79.8 kg)   BMI 25.99 kg/m  , BMI Body mass index is 25.99 kg/m. GENERAL:  Well appearing.  No acute distress HEENT: Pupils equal round and reactive, fundi not visualized, oral mucosa unremarkable NECK:  No jugular venous distention, waveform within normal limits, carotid upstroke brisk and symmetric, no bruits, no thyromegaly LYMPHATICS:  No cervical adenopathy LUNGS:  Clear to auscultation bilaterally HEART:  RRR.  PMI not displaced or sustained,S1 and S2 within normal limits, no S3, no S4, no clicks, no rubs, II/VI holosystolic murmur at the apex.  III/VI systolic murmur at the LUSB. ABD:  Flat, positive bowel sounds normal in frequency in pitch, no bruits, no rebound, no guarding, no midline pulsatile mass, no hepatomegaly, no splenomegaly EXT:  2 plus pulses throughout, no edema, no cyanosis no clubbing SKIN:  No rashes no nodules NEURO:  Cranial nerves II through XII grossly intact, motor grossly intact throughout PSYCH:  Cognitively intact, oriented to person place and time   EKG:  EKG is ordered today. The ekg ordered 12/15/16 demonstrates sinus rhythm.  Rate 81 bpm.  PACs.  LVH with repolariazation abnormality.  08/15/17: Sinus rhythm.  Rate 76 bpm.  LVH with secondary repolarization abnormality. 06/06/18: Sinus rhythm.  Rate 73 bpm.  LVH.   Echo 08/08/16: Study Conclusions  - Left ventricle: The cavity size was normal. There was mild focal   basal hypertrophy of the septum. Systolic function was normal.   The estimated ejection fraction was in the range of 60% to 65%.   Wall motion was normal; there were no regional wall motion   abnormalities. Features are consistent with a pseudonormal left    ventricular filling pattern, with concomitant abnormal relaxation   and increased filling pressure (grade 2 diastolic dysfunction).   Doppler parameters are consistent with elevated ventricular   end-diastolic filling pressure. - Aortic valve: Transvalvular velocity was minimally increased.   There was mild stenosis. There was mild regurgitation. Peak   gradient (S): 25 mm Hg. - Aortic root: The aortic root was normal in size. - Mitral valve: Severely thickened and mildly calcified mitral   valve that is rheumatic in apearance. There is limited leaflet   separation with severe mitral stenosis and moderate   regurgitation. The findings are consistent with severe stenosis.   There was moderate regurgitation. Mean gradient (D): 17 mm Hg.   Peak gradient (D):  40 mm Hg. - Left atrium: The atrium was severely dilated. - Right ventricle: Systolic function was normal. - Tricuspid valve: There was mild regurgitation. - Pulmonic valve: There was no regurgitation. - Pulmonary arteries: Systolic pressure was within the normal   range. - Inferior vena cava: The vessel was normal in size. - Pericardium, extracardiac: There was no pericardial effusion.  Impressions:  - Severely thickened and mildly calcified mitral valve that is   rheumatic in apearance. There is limited leaflet eparation with   severe mitral stenosis and moderate regurgitation.   Recent Labs: No results found for requested labs within last 8760 hours.    Lipid Panel    Component Value Date/Time   CHOL 157 06/01/2016 1756   TRIG 129 06/01/2016 1756   HDL 47 06/01/2016 1756   CHOLHDL 3.3 06/01/2016 1756   VLDL 26 06/01/2016 1756   LDLCALC 84 06/01/2016 1756      Wt Readings from Last 3 Encounters:  06/06/18 176 lb (79.8 kg)  02/03/18 170 lb (77.1 kg)  08/15/17 176 lb 6.4 oz (80 kg)      ASSESSMENT AND PLAN:  # Rheumatic valve disease: # Severe mitral stenosis: # Moderate mitral regurgitation: # Mild  aortic stenosis: Mr. Burns has severe mitral stenosis and moderate mitral regurgitation.   He is very physically active and remains asymptomatic.  We will continue to follow him closely.  Echo unchanged 06/2017.  We will repeat in 6 months.  Given his moderate mitral regurgitation he would not be a good candidate for balloon valvotomy.   # Hypertension: Blood pressure well-controlled.  Continue amlodipine, chlorthalidone, and lisinopril.  Check CMP.  Continue exercise.    # Tobacco abuse:  Encouraged cessation.  He liked nicotine gum in the past but was trying to use one piece daily.  We discussed the fact that this should be used every 1-2 hours and parked in the cheek.  He wants to try this.  We will also refer him to our care guide.  We discussed smoking cessation for 5 minutes.    Current medicines are reviewed at length with the patient today.  The patient does not have concerns regarding medicines.  The following changes have been made:    Labs/ tests ordered today include:   Orders Placed This Encounter  Procedures  . Comprehensive metabolic panel  . EKG 12-Lead  . ECHOCARDIOGRAM COMPLETE     Disposition:   FU with Jonathan Kirkendoll C. Duke Salvia, MD, [facility] in 6 months.      Signed, Hope Brandenburger C. Duke Salvia, MD, [facility]  06/06/2018 5:31 PM    [facility]

## 2018-06-12 ENCOUNTER — Telehealth: Payer: Self-pay

## 2018-06-12 NOTE — Telephone Encounter (Signed)
Called to schedule initial visit with care guide. Left message

## 2018-06-23 ENCOUNTER — Other Ambulatory Visit: Payer: Self-pay

## 2018-06-23 ENCOUNTER — Emergency Department (HOSPITAL_COMMUNITY)
Admission: EM | Admit: 2018-06-23 | Discharge: 2018-06-23 | Disposition: A | Discharge status: Home / Self Care | Payer: No Typology Code available for payment source | Attending: Emergency Medicine | Admitting: Emergency Medicine

## 2018-06-23 ENCOUNTER — Encounter (HOSPITAL_COMMUNITY): Payer: Self-pay

## 2018-06-23 DIAGNOSIS — M21612 Bunion of left foot: Secondary | ICD-10-CM | POA: Diagnosis not present

## 2018-06-23 DIAGNOSIS — G8929 Other chronic pain: Secondary | ICD-10-CM | POA: Diagnosis not present

## 2018-06-23 DIAGNOSIS — I10 Essential (primary) hypertension: Secondary | ICD-10-CM | POA: Diagnosis not present

## 2018-06-23 DIAGNOSIS — F17210 Nicotine dependence, cigarettes, uncomplicated: Secondary | ICD-10-CM | POA: Diagnosis not present

## 2018-06-23 DIAGNOSIS — M79672 Pain in left foot: Secondary | ICD-10-CM | POA: Diagnosis present

## 2018-06-23 DIAGNOSIS — Z79899 Other long term (current) drug therapy: Secondary | ICD-10-CM | POA: Diagnosis not present

## 2018-06-23 MED ORDER — NAPROXEN 500 MG PO TABS: 500.0000 mg | ORAL_TABLET | Freq: Two times a day (BID) | ORAL | 0 refills | Status: DC

## 2018-06-23 MED ORDER — NAPROXEN 500 MG PO TABS
500.0000 mg | ORAL_TABLET | Freq: Once | ORAL | Status: AC
  Administered 2018-06-23: 500 mg via ORAL
  Filled 2018-06-23: qty 1

## 2018-06-23 NOTE — ED Notes (Signed)
EDPA Provider at bedside. Melvin Burns

## 2018-06-23 NOTE — ED Triage Notes (Signed)
Tells me that he has left foot pain at distal plantar aspect x 3 (three) years. He is in no distress.

## 2018-06-23 NOTE — ED Provider Notes (Signed)
[facility]-EMERGENCY DEPT Provider Note   CSN: [id number] Arrival date & time: 06/23/18  0850     History   Chief Complaint Chief Complaint  Patient presents with  . Foot Pain    HPI Melvin Burns is a 52 y.o. male with PMHx HTN, mitral valve stenosis, presenting to the ED with complaint of chronic left foot pain that has been ongoing for 3 years.  He states he has pain when he is working and walking around that is located on the plantar aspect of his foot, worse at the base of the first toe.  He states he has been wearing work boots that are too large for him and he has needed to curl his toes in order to keep them on while he is working.  This is caused him more pain recently for which he reports to the ED.  Denies redness or swelling, fevers, or history of gout.  Denies recent injury.  Takes ibuprofen for symptoms.    The history is provided by the patient.    Past Medical History:  Diagnosis Date  . Hypertension   . Mild aortic stenosis 08/15/2017  . Mitral valve stenosis, severe 08/15/2017  . Moderate mitral regurgitation 08/15/2017  . 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  Moderate mitral regurgitation.    Patient Active Problem List   Diagnosis Date Noted  . Mitral valve stenosis, severe 08/15/2017  . Moderate mitral regurgitation 08/15/2017  . Mild aortic stenosis 08/15/2017  . Abdominal pain, epigastric 01/02/2017  . Non-intractable vomiting with nausea 01/02/2017  . 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 Hypertension, benign 01/09/2014  . Undiagnosed cardiac murmurs 01/09/2014  . Accelerated hypertension 03/11/2013  . Family history of stroke 03/11/2013  . Essential hypertension 03/11/2013  . Smoker 03/11/2013  . Dental caries 03/11/2013  . Chronic periodontitis, unspecified 03/11/2013    Past Surgical History:  Procedure  Laterality Date  . BILATERAL KNEE ARTHROSCOPY          Home Medications    Prior to Admission medications   Medication Sig Start Date End Date Taking? Authorizing Provider  amLODipine (NORVASC) 10 MG tablet TAKE 1 TABLET(10 MG) BY MOUTH DAILY 01/24/18   Chilton Si, MD  chlorthalidone (HYGROTON) 25 MG tablet Take 1 tablet (25 mg total) by mouth daily. 07/04/17   Chilton Si, MD  doxycycline (VIBRAMYCIN) 100 MG capsule Take 1 capsule (100 mg total) by mouth 2 (two) times daily. 02/03/18   Janne Napoleon, NP  HYDROcodone-acetaminophen (NORCO/VICODIN) 5-325 MG tablet Take 1 tablet by mouth every 12 (twelve) hours as needed. 01/04/17   Derwood Kaplan, MD  lisinopril (PRINIVIL,ZESTRIL) 10 MG tablet TAKE 1 TABLET(10 MG) BY MOUTH DAILY 08/29/17   Chilton Si, MD  naproxen (NAPROSYN) 500 MG tablet Take 1 tablet (500 mg total) by mouth 2 (two) times daily with a meal. 06/23/18   Miche Loughridge, [place] N, PA-C  omeprazole (PRILOSEC) 40 MG capsule Take 1 capsule (40 mg total) by mouth daily. 01/02/17   Georgina Quint, MD  ondansetron (ZOFRAN ODT) 4 MG disintegrating tablet Take 1 tablet (4 mg total) by mouth every 8 (eight) hours as needed for nausea or vomiting. 01/04/17   Derwood Kaplan, MD  ondansetron (ZOFRAN) 4 MG tablet Take 1 tablet (4 mg total) by mouth every 8 (eight) hours as needed for nausea or vomiting. 01/02/17   Georgina Quint, MD  ranitidine Orpha Bur)  300 MG tablet Take 1 tablet (300 mg total) by mouth at bedtime. 01/02/17 01/16/17  Georgina Quint, MD  tadalafil (CIALIS) 5 MG tablet 1 TO 2 TABLETS AS NEEDED Patient taking differently: Take 5 mg by mouth daily as needed for erectile dysfunction. 1 TO 2 TABLETS AS NEEDED 08/22/16   Chilton Si, MD  traMADol (ULTRAM) 50 MG tablet Take 1 tablet (50 mg total) by mouth every 6 (six) hours as needed. 02/03/18   Janne Napoleon, NP    Family History Family History  Problem Relation Age of Onset  . Hypertension  Mother   . Hypertension Father   . Stroke Father     Social History Social History   Tobacco Use  . Smoking status: Current Every Day Smoker    Packs/day: 1.00    Types: Cigarettes  . Smokeless tobacco: Never Used  Substance Use Topics  . Alcohol use: No  . Drug use: No     Allergies   Patient has no known allergies.   Review of Systems Review of Systems  Constitutional: Negative for fever.  Musculoskeletal: Positive for arthralgias and myalgias. Negative for joint swelling.  Skin: Negative for color change.  Neurological: Negative for numbness.     Physical Exam Updated Vital Signs BP 136/85 (BP Location: Right Arm)   Pulse 74   Temp 98.1 F (36.7 C) (Oral)   Resp 16   Ht 5\' 9"  (1.753 m)   Wt 79.8 kg   SpO2 98%   BMI 25.98 kg/m   Physical Exam  Constitutional: He appears well-developed and well-nourished.  HENT:  Head: Normocephalic and atraumatic.  Eyes: Conjunctivae are normal.  Cardiovascular: Intact distal pulses.  Pulmonary/Chest: Effort normal.  Musculoskeletal:  Left foot with bunion at 1st MTP joint. Some mild tenderness to planter aspect at 1st MTP joint as well as through arch of foot. Normal ROM. Mild pain with active and passive dorsiflexion of toes.  Psychiatric: He has a normal mood and affect. His behavior is normal.  Nursing note and vitals reviewed.    ED Treatments / Results  Labs (all labs ordered are listed, but only abnormal results are displayed) Labs Reviewed - No data to display  EKG None  Radiology No results found.  Procedures Procedures (including critical care time)  Medications Ordered in ED Medications  naproxen (NAPROSYN) tablet 500 mg (500 mg Oral Given 06/23/18 0936)     Initial Impression / Assessment and Plan / ED Course  I have reviewed the triage vital signs and the nursing notes.  Pertinent labs & imaging results that were available during my care of the patient were reviewed by me and considered  in my medical decision making (see chart for details).     Patient presenting with chronic left foot pain x3 years.  No new injury.  Exam with bunion and tenderness over arch of foot.  Suspect bunion as cause of pain with possible plantar fasciitis.  Joint is not hot, red, or swollen and has normal range of motion.  No evidence of gout or septic arthritis.  Discussed RICE therapy, NSAIDs, and referral to podiatry.  Ace wrap applied in ED for compression and support.  Safe for discharge.  Discussed results, findings, treatment and follow up. Patient advised of return precautions. Patient verbalized understanding and agreed with plan.  Final Clinical Impressions(s) / ED Diagnoses   Final diagnoses:  Chronic foot pain, left  Bunion of great toe of left foot    ED Discharge  Orders         Ordered    naproxen (NAPROSYN) 500 MG tablet  2 times daily with meals     06/23/18 0937           Ivorie Uplinger, [place] N, PA-C 06/23/18 [id number]    Mancel BaleWentz, [place], MD 06/23/18 [phone]

## 2018-06-23 NOTE — Discharge Instructions (Signed)
Please read instructions below. Apply ice to your foot for 20 minutes at a time. You can take naproxen every 12 hours as needed for pain. Schedule an appointment with the foot specialist for further management options. Return to the ER for new or concerning symptoms.

## 2018-06-23 NOTE — ED Notes (Signed)
RX X 1 GIVEN 

## 2018-07-08 ENCOUNTER — Other Ambulatory Visit: Payer: Self-pay | Admitting: Cardiovascular Disease

## 2018-07-08 DIAGNOSIS — I10 Essential (primary) hypertension: Secondary | ICD-10-CM

## 2018-07-19 ENCOUNTER — Encounter: Payer: Self-pay | Admitting: Cardiovascular Disease

## 2018-08-10 ENCOUNTER — Other Ambulatory Visit: Payer: Self-pay

## 2018-08-10 ENCOUNTER — Ambulatory Visit (HOSPITAL_COMMUNITY): Payer: No Typology Code available for payment source | Attending: Cardiovascular Disease

## 2018-08-10 DIAGNOSIS — I050 Rheumatic mitral stenosis: Secondary | ICD-10-CM | POA: Diagnosis not present

## 2018-08-10 DIAGNOSIS — I10 Essential (primary) hypertension: Secondary | ICD-10-CM | POA: Diagnosis present

## 2018-08-10 DIAGNOSIS — I350 Nonrheumatic aortic (valve) stenosis: Secondary | ICD-10-CM | POA: Diagnosis not present

## 2018-10-27 ENCOUNTER — Other Ambulatory Visit: Payer: Self-pay | Admitting: Cardiovascular Disease

## 2018-10-29 NOTE — Telephone Encounter (Signed)
Rx has been sent to the pharmacy electronically. ° °

## 2019-06-05 ENCOUNTER — Telehealth: Payer: Self-pay | Admitting: Cardiovascular Disease

## 2019-06-05 NOTE — Telephone Encounter (Signed)
New Message   1. What dental office are you calling from? Dr. Darel Hong    2. What is your office phone number? [phone]  3. What is your fax number? [phone]  4. What type of procedure is the patient having performed? Upper and Lower Denture   5. What date is procedure scheduled or is the patient there now? TBD  (if the patient is at the dentist's office question goes to their cardiologist if he/she is in the office.  If not, question should go to the DOD).   6. What is your question (ex. Antibiotics prior to procedure, holding medication-we need to know how long dentist wants pt to hold med)? Clearance due to High Blood Pressure

## 2019-06-05 NOTE — Telephone Encounter (Signed)
   Primary [place], MD  Chart reviewed as part of pre-operative protocol coverage. Because of Dagan M. Wade's past medical history and time since last visit, he/she will require a follow-up visit in order to better assess preoperative cardiovascular risk.  Pre-op covering staff: - Please schedule appointment and call patient to inform them. - Please contact requesting surgeon's office via preferred method (i.e, phone, fax) to inform them of need for appointment prior to surgery.  If applicable, this message will also be routed to pharmacy pool and/or primary cardiologist for input on holding anticoagulant/antiplatelet agent as requested below so that this information is available at time of patient's appointment.   Tami Lin Duke, PA  06/05/2019, 1:54 PM

## 2019-06-05 NOTE — Telephone Encounter (Signed)
lmtcb

## 2019-06-06 NOTE — Telephone Encounter (Signed)
Left a detailed message for the requesting office to give me a call back that I was needing more information for the clearance.

## 2019-06-07 NOTE — Telephone Encounter (Signed)
Drema from Dr. Rozann Lesches office called and stated that the type of procedure will be an upper and lower dentures along with 10 teeth extractions. She stated that Dr. Eulas Post would like to know if 10 teeth can be extracted at 1 time. The are different options for anesthesia like 2% lidocaine, 1.1 K Epinephrine, and/or Articaine/Epinephrine 4%

## 2019-06-07 NOTE — Telephone Encounter (Signed)
Left voice message for the patient to call the office back to get scheduled for an appointment. Will try calling patient back.

## 2019-06-07 NOTE — Telephone Encounter (Signed)
Given the extensive nature of the procedure, cardiac clearance will be provided in clinic once the patient is seen. See previous note by Fabian Sharp, will need to schedule appt

## 2019-06-11 ENCOUNTER — Telehealth: Payer: Self-pay

## 2019-06-11 ENCOUNTER — Telehealth (INDEPENDENT_AMBULATORY_CARE_PROVIDER_SITE_OTHER): Payer: No Typology Code available for payment source | Admitting: Cardiology

## 2019-06-11 ENCOUNTER — Encounter: Payer: Self-pay | Admitting: Cardiology

## 2019-06-11 VITALS — BP 125/87 | HR 81 | Ht 69.0 in | Wt 170.0 lb

## 2019-06-11 DIAGNOSIS — F17200 Nicotine dependence, unspecified, uncomplicated: Secondary | ICD-10-CM

## 2019-06-11 DIAGNOSIS — Z01818 Encounter for other preprocedural examination: Secondary | ICD-10-CM

## 2019-06-11 DIAGNOSIS — I050 Rheumatic mitral stenosis: Secondary | ICD-10-CM

## 2019-06-11 DIAGNOSIS — I10 Essential (primary) hypertension: Secondary | ICD-10-CM

## 2019-06-11 NOTE — Patient Instructions (Signed)
Medication Instructions:  Your physician recommends that you continue on your current medications as directed. Please refer to the Current Medication list given to you today. If you need a refill on your cardiac medications before your next appointment, please call your pharmacy.   Lab work: None  If you have labs (blood work) drawn today and your tests are completely normal, you will receive your results only by: Marland Kitchen MyChart Message (if you have MyChart) OR . A paper copy in the mail If you have any lab test that is abnormal or we need to change your treatment, we will call you to review the results.  Testing/Procedures: Your physician has requested that you have an echocardiogram. Echocardiography is a painless test that uses sound waves to create images of your heart. It provides your doctor with information about the size and shape of your heart and how well your heart's chambers and valves are working. This procedure takes approximately one hour. There are no restrictions for this procedure. [place] SOMETIME IN November   Follow-Up: At [facility], you and your health needs are our priority.  As part of our continuing mission to provide you with exceptional heart care, we have created designated Provider Care Teams.  These Care Teams include your primary Cardiologist (physician) and Advanced Practice Providers (APPs -  Physician Assistants and Nurse Practitioners) who all work together to provide you with the care you need, when you need it. You will need a follow up appointment in 2 months.  Please call our office 2 months in advance to schedule this appointment.  You may see Skeet Latch, MD or one of the following Advanced Practice Providers on your designated Care Team:   Kerin Ransom, PA-C Roby Lofts, [place] . Sande Rives, PA-C  Any Other Special Instructions Will Be Listed Below (If Applicable).

## 2019-06-11 NOTE — Telephone Encounter (Signed)
Virtual Visit Pre-Appointment Phone Call  "[facility], I am calling you today to discuss your upcoming appointment. We are currently trying to limit exposure to the virus that causes COVID-19 by seeing patients at home rather than in the office."  1. "What is the BEST phone number to call the day of the visit?" - include this in appointment notes  2. "Do you have or have access to (through a family member/friend) a smartphone with video capability that we can use for your visit?" a. If yes - list this number in appt notes as "cell" (if different from BEST phone #) and list the appointment type as a VIDEO visit in appointment notes b. If no - list the appointment type as a PHONE visit in appointment notes  3. Confirm consent - "In the setting of the current Covid19 crisis, you are scheduled for a (phone or video) visit with your provider on (date) at (time).  Just as we do with many in-office visits, in order for you to participate in this visit, we must obtain consent.  If you'd like, I can send this to your mychart (if signed up) or email for you to review.  Otherwise, I can obtain your verbal consent now.  All virtual visits are billed to your insurance company just like a normal visit would be.  By agreeing to a virtual visit, we'd like you to understand that the technology does not allow for your provider to perform an examination, and thus may limit your provider's ability to fully assess your condition. If your provider identifies any concerns that need to be evaluated in person, we will make arrangements to do so.  Finally, though the technology is pretty good, we cannot assure that it will always work on either your or our end, and in the setting of a video visit, we may have to convert it to a phone-only visit.  In either situation, we cannot ensure that we have a secure connection.  Are you willing to proceed?" STAFF: Did the patient verbally acknowledge consent to telehealth  visit? Document YES/NO here: YES  4. Advise patient to be prepared - "Two hours prior to your appointment, go ahead and check your blood pressure, pulse, oxygen saturation, and your weight (if you have the equipment to check those) and write them all down. When your visit starts, your provider will ask you for this information. If you have an Apple Watch or Kardia device, please plan to have heart rate information ready on the day of your appointment. Please have a pen and paper handy nearby the day of the visit as well."  5. Give patient instructions for MyChart download to smartphone OR Doximity/Doxy.me as below if video visit (depending on what platform provider is using)  6. Inform patient they will receive a phone call 15 minutes prior to their appointment time (may be from unknown caller ID) so they should be prepared to answer    TELEPHONE CALL NOTE  Shray M. Kratt has been deemed a candidate for a follow-up tele-health visit to limit community exposure during the Covid-19 pandemic. I spoke with the patient via phone to ensure availability of phone/video source, confirm preferred email & phone number, and discuss instructions and expectations.  I reminded Guss M. Morrell to be prepared with any vital sign and/or heart rhythm information that could potentially be obtained via home monitoring, at the time of his visit. I reminded Sung M. Trego to expect a phone call  prior to his visit.  Harold Hedge, CMA 06/11/2019 10:26 AM   INSTRUCTIONS FOR DOWNLOADING THE MYCHART APP TO SMARTPHONE  - The patient must first make sure to have activated MyChart and know their login information - If Apple, go to [employer] and type in MyChart in the search bar and download the app. If Android, ask patient to go to [employer] and type in [facility] in the search bar and download the app. The app is free but as with any other app downloads, their phone may require them to verify saved  payment information or Apple/Android password.  - The patient will need to then log into the app with their MyChart username and password, and select  as their healthcare provider to link the account. When it is time for your visit, go to the MyChart app, find appointments, and click Begin Video Visit. Be sure to Select Allow for your device to access the Microphone and Camera for your visit. You will then be connected, and your provider will be with you shortly.  **If they have any issues connecting, or need assistance please contact MyChart service desk [phone])**  **If using a computer, in order to ensure the best quality for their visit they will need to use either of the following Internet Browsers: [employer], or Google Chrome**  IF USING DOXIMITY or DOXY.ME - The patient will receive a link just prior to their visit by text.     FULL LENGTH CONSENT FOR TELE-HEALTH VISIT   I hereby voluntarily request, consent and authorize [facility] and its employed or contracted physicians, physician assistants, nurse practitioners or other licensed health care professionals (the Practitioner), to provide me with telemedicine health care services (the "Services") as deemed necessary by the treating Practitioner. I acknowledge and consent to receive the Services by the Practitioner via telemedicine. I understand that the telemedicine visit will involve communicating with the Practitioner through live audiovisual communication technology and the disclosure of certain medical information by electronic transmission. I acknowledge that I have been given the opportunity to request an in-person assessment or other available alternative prior to the telemedicine visit and am voluntarily participating in the telemedicine visit.  I understand that I have the right to withhold or withdraw my consent to the use of telemedicine in the course of my care at any time, without affecting  my right to future care or treatment, and that the Practitioner or I may terminate the telemedicine visit at any time. I understand that I have the right to inspect all information obtained and/or recorded in the course of the telemedicine visit and may receive copies of available information for a reasonable fee.  I understand that some of the potential risks of receiving the Services via telemedicine include:  Marland Kitchen Delay or interruption in medical evaluation due to technological equipment failure or disruption; . Information transmitted may not be sufficient (e.g. poor resolution of images) to allow for appropriate medical decision making by the Practitioner; and/or  . In rare instances, security protocols could fail, causing a breach of personal health information.  Furthermore, I acknowledge that it is my responsibility to provide information about my medical history, conditions and care that is complete and accurate to the best of my ability. I acknowledge that Practitioner's advice, recommendations, and/or decision may be based on factors not within their control, such as incomplete or inaccurate data provided by me or distortions of diagnostic images or specimens that may result from  electronic transmissions. I understand that the practice of medicine is not an exact science and that Practitioner makes no warranties or guarantees regarding treatment outcomes. I acknowledge that I will receive a copy of this consent concurrently upon execution via email to the email address I last provided but may also request a printed copy by calling the office of [facility].    I understand that my insurance will be billed for this visit.   I have read or had this consent read to me. . I understand the contents of this consent, which adequately explains the benefits and risks of the Services being provided via telemedicine.  . I have been provided ample opportunity to ask questions regarding this consent and the  Services and have had my questions answered to my satisfaction. . I give my informed consent for the services to be provided through the use of telemedicine in my medical care  By participating in this telemedicine visit I agree to the above.

## 2019-06-11 NOTE — Telephone Encounter (Signed)
Contacted patient to discuss AVS Instructions. Gave patient Melvin Burns's recommendations from today's virtual office visit. Informed patient that someone from the scheduling dept will be in contact with them to schedule their follow up appt. Patient voiced understanding and AVS mailed.    

## 2019-06-11 NOTE — Progress Notes (Signed)
Virtual Visit via Video Note   This visit type was conducted due to national recommendations for restrictions regarding the COVID-19 Pandemic (e.g. social distancing) in an effort to limit this patient's exposure and mitigate transmission in our community.  Due to his co-morbid illnesses, this patient is at least at moderate risk for complications without adequate follow up.  This format is felt to be most appropriate for this patient at this time.  All issues noted in this document were discussed and addressed.  A limited physical exam was performed with this format.  Please refer to the patient's chart for his consent to telehealth for [facility].   Date:  06/11/2019   ID:  Melvin Burns, DOB 01-05-1966, MRN [id number]  Patient Location: Home Provider Location: Home  PCP:  Tresa Garter, MD  Cardiologist:  Skeet Latch, MD  Electrophysiologist:  None   Evaluation Performed:  Follow-Up Visit  Chief Complaint:  Pre op clearance  History of Present Illness:    Melvin Burns is a 53 y.o. male with a history of rheumatic valvular heart disease.  He has severe mitral stenosis.  His last echocardiogram was November 2019.  He has been asymptomatic.  Before CO VID he was going to the gym regularly.  He tells me now that he is riding his bicycle at least a couple miles 2 or 3 times a week.  He denies any unusual shortness of breath.  He needs dental extraction and dentures placed.  He was contacted today for preoperative clearance.  We initially contacted them through a video visit but due to technical difficulties this had to be changed to a phone visit.  The patient does not have symptoms concerning for COVID-19 infection (fever, chills, cough, or new shortness of breath).    Past Medical History:  Diagnosis Date  . Hypertension   . Mild aortic stenosis 08/15/2017  . Mitral valve stenosis, severe 08/15/2017  . Moderate mitral regurgitation 08/15/2017  . 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  Moderate mitral regurgitation.   Past Surgical History:  Procedure Laterality Date  . BILATERAL KNEE ARTHROSCOPY       Current Meds  Medication Sig  . amLODipine (NORVASC) 10 MG tablet TAKE 1 TABLET(10 MG) BY MOUTH DAILY  . chlorthalidone (HYGROTON) 25 MG tablet TAKE 1 TABLET(25 MG) BY MOUTH DAILY  . lisinopril (PRINIVIL,ZESTRIL) 10 MG tablet TAKE 1 TABLET(10 MG) BY MOUTH DAILY     Allergies:   Patient has no known allergies.   Social History   Tobacco Use  . Smoking status: Current Every Day Smoker    Packs/day: 1.00    Types: Cigarettes  . Smokeless tobacco: Never Used  Substance Use Topics  . Alcohol use: No  . Drug use: No     Family Hx: The patient's family history includes Hypertension in his father and mother; Stroke in his father.  ROS:   Please see the history of present illness.    All other systems reviewed and are negative.   Prior CV studies:   The following studies were reviewed today:  Echo Nov 2018  Labs/Other Tests and Data Reviewed:    EKG:  No ECG reviewed.  Recent Labs: No results found for requested labs within last 8760 hours.   Recent Lipid Panel Lab Results  Component Value Date/Time   CHOL 157 06/01/2016 05:56 PM   TRIG 129 06/01/2016 05:56 PM  HDL 47 06/01/2016 05:56 PM   CHOLHDL 3.3 06/01/2016 05:56 PM   LDLCALC 84 06/01/2016 05:56 PM    Wt Readings from Last 3 Encounters:  06/11/19 170 lb (77.1 kg)  06/23/18 175 lb 14.8 oz (79.8 kg)  06/06/18 176 lb (79.8 kg)     Objective:    Vital Signs:  BP 125/87   Pulse 81   Ht 5\' 9"  (1.753 m)   Wt 170 lb (77.1 kg)   BMI 25.10 kg/m    VITAL SIGNS:  reviewed  ASSESSMENT & PLAN:    Pre op clearance- Pt is acceptable risk for proposed procedure without further cardiac testing.  According to [facility] guidelines- SBE prophylaxis is not required.  Severe MS- Last echo  Nov 2019- he has been asymptomatic.  HTN- Controlled  Smoker- 1/2 PPD  Plan: Reviewed with Dr Rennis GoldenHilty who was DOD today. Pt is an acceptable risk for the planned procedure. According to [facility] guideline he will not require SBE prophylaxis.  I will arrange a f/u echo for Nov and then an OV with Dr Duke Salviaandolph.  COVID-19 Education: The signs and symptoms of COVID-19 were discussed with the patient and how to seek care for testing (follow up with PCP or arrange E-visit).  The importance of social distancing was discussed today.  Time:   Today, I have spent 15 minutes with the patient with telehealth technology discussing the above problems.     Medication Adjustments/Labs and Tests Ordered: Current medicines are reviewed at length with the patient today.  Concerns regarding medicines are outlined above.   Tests Ordered: No orders of the defined types were placed in this encounter.   Medication Changes: No orders of the defined types were placed in this encounter.   Follow Up:  In Person After his echo in November  Signed, Gryphon Vanderveen, [place]PA-C  06/11/2019 10:40 AM    [facility]

## 2019-06-11 NOTE — Telephone Encounter (Signed)
   Primary Cardiologist: Skeet Latch, MD  Chart reviewed and patient contacted today by phone as part of pre-operative protocol coverage. Given past medical history and time since last visit, based on ACC/AHA guidelines, Cullen M. Eggenberger would be at acceptable risk for the multiple dental extraction without further cardiovascular testing.   According to [facility] guidelines there is no need for pre op antibiotics.  There is no reason to hold his B/P medications pre op.  Local anesthetics of choice can be used if needed.   I will route this recommendation to the requesting party via Epic fax function and remove from pre-op pool.  Please call with questions.  Kerin Ransom, PA-C 06/11/2019, 11:07 AM

## 2019-07-27 ENCOUNTER — Emergency Department (HOSPITAL_COMMUNITY)
Admission: EM | Admit: 2019-07-27 | Discharge: 2019-07-28 | Disposition: A | Discharge status: Home / Self Care | Payer: No Typology Code available for payment source | Attending: Emergency Medicine | Admitting: Emergency Medicine

## 2019-07-27 ENCOUNTER — Other Ambulatory Visit: Payer: Self-pay

## 2019-07-27 ENCOUNTER — Encounter (HOSPITAL_COMMUNITY): Payer: Self-pay | Admitting: Emergency Medicine

## 2019-07-27 DIAGNOSIS — R0602 Shortness of breath: Secondary | ICD-10-CM | POA: Diagnosis present

## 2019-07-27 DIAGNOSIS — Z79899 Other long term (current) drug therapy: Secondary | ICD-10-CM | POA: Insufficient documentation

## 2019-07-27 DIAGNOSIS — J189 Pneumonia, unspecified organism: Secondary | ICD-10-CM | POA: Insufficient documentation

## 2019-07-27 DIAGNOSIS — F17210 Nicotine dependence, cigarettes, uncomplicated: Secondary | ICD-10-CM | POA: Insufficient documentation

## 2019-07-27 DIAGNOSIS — I10 Essential (primary) hypertension: Secondary | ICD-10-CM | POA: Insufficient documentation

## 2019-07-27 NOTE — ED Triage Notes (Signed)
Pt reporting increasing shortness of breath with exertion especially when climbing stairs.

## 2019-07-28 ENCOUNTER — Emergency Department (HOSPITAL_COMMUNITY): Payer: No Typology Code available for payment source

## 2019-07-28 LAB — COMPREHENSIVE METABOLIC PANEL
ALT: 14 U/L (ref 0–44)
AST: 17 U/L (ref 15–41)
Albumin: 3.7 g/dL (ref 3.5–5.0)
Alkaline Phosphatase: 60 U/L (ref 38–126)
Anion gap: 9 (ref 5–15)
BUN: 13 mg/dL (ref 6–20)
CO2: 24 mmol/L (ref 22–32)
Calcium: 9.6 mg/dL (ref 8.9–10.3)
Chloride: 105 mmol/L (ref 98–111)
Creatinine, Ser: 0.96 mg/dL (ref 0.61–1.24)
GFR calc Af Amer: >60 mL/min (ref >60)
GFR calc non Af Amer: >60 mL/min (ref >60)
Glucose, Bld: 106 mg/dL — ABNORMAL HIGH (ref 70–99)
Potassium: 3.7 mmol/L (ref 3.5–5.1)
Sodium: 138 mmol/L (ref 135–145)
Total Bilirubin: 1.4 mg/dL — ABNORMAL HIGH (ref 0.3–1.2)
Total Protein: 8.1 g/dL (ref 6.5–8.1)

## 2019-07-28 LAB — CBC WITH DIFFERENTIAL/PLATELET
Abs Immature Granulocytes: 0.12 10*3/uL — ABNORMAL HIGH (ref 0.00–0.07)
Basophils Absolute: 0.1 10*3/uL (ref 0.0–0.1)
Basophils Relative: 1 %
Eosinophils Absolute: 0.1 10*3/uL (ref 0.0–0.5)
Eosinophils Relative: 1 %
HCT: 39.4 % (ref 39.0–52.0)
Hemoglobin: 12.2 g/dL — ABNORMAL LOW (ref 13.0–17.0)
Immature Granulocytes: 1 %
Lymphocytes Relative: 10 %
Lymphs Abs: 2 10*3/uL (ref 0.7–4.0)
MCH: 28.4 pg (ref 26.0–34.0)
MCHC: 31 g/dL (ref 30.0–36.0)
MCV: 91.6 fL (ref 80.0–100.0)
Monocytes Absolute: 2.5 10*3/uL — ABNORMAL HIGH (ref 0.1–1.0)
Monocytes Relative: 12 %
Neutro Abs: 15.7 10*3/uL — ABNORMAL HIGH (ref 1.7–7.7)
Neutrophils Relative %: 75 %
Platelets: 186 10*3/uL (ref 150–400)
RBC: 4.3 MIL/uL (ref 4.22–5.81)
RDW: 13.1 % (ref 11.5–15.5)
WBC: 20.5 10*3/uL — ABNORMAL HIGH (ref 4.0–10.5)
nRBC: 0 % (ref 0.0–0.2)

## 2019-07-28 LAB — BRAIN NATRIURETIC PEPTIDE: B Natriuretic Peptide: 221.3 pg/mL — ABNORMAL HIGH (ref 0.0–100.0)

## 2019-07-28 LAB — LIPASE, BLOOD: Lipase: 17 U/L (ref 11–51)

## 2019-07-28 MED ORDER — AZITHROMYCIN 250 MG PO TABS: ORAL_TABLET | ORAL | 0 refills | Status: DC

## 2019-07-28 MED ORDER — ALBUTEROL SULFATE HFA 108 (90 BASE) MCG/ACT IN AERS: 2.0000 | INHALATION_SPRAY | RESPIRATORY_TRACT | 0 refills | Status: DC | PRN

## 2019-07-28 MED ORDER — AMOXICILLIN-POT CLAVULANATE 875-125 MG PO TABS: 1.0000 | ORAL_TABLET | Freq: Two times a day (BID) | ORAL | 0 refills | Status: AC

## 2019-07-28 NOTE — Discharge Instructions (Signed)
Thank you for allowing me to care for you today in the Emergency Department.   Take Augmentin and azithromycin as prescribed.  These are antibiotics used to treat pneumonia.  Use 2 puffs of the albuterol inhaler every 4 hours as needed for shortness of breath.  Please call and schedule follow-up appointment with your primary care provider for recheck of your symptoms in 3 to 5 days.  If you develop a fever, you can take Tylenol and ibuprofen.  Return to the emergency department if you develop respiratory distress, if you pass out, develop chest pain, or other new, concerning symptoms.

## 2019-07-28 NOTE — ED Provider Notes (Signed)
[facility] DEPT Provider Note   CSN: [id number] Arrival date & time: 07/27/19  2244     History   Chief Complaint Chief Complaint  Patient presents with   Shortness of Breath    HPI Melvin Burns is a 53 y.o. male with a history of severe mitral valve stenosis, rheumatic mitral stenosis with regurgitation, HTN who presents to the emergency department with a chief complaint of shortness of breath.  The patient reports he has been having constant shortness of breath that began when he awoke this morning.  He reports the shortness of breath worsened tonight prior to going to bed after completing some chores around his home.  He reports that he attempted to lay down to go to sleep, but was unable to tolerate laying flat so he came to the ER for further evaluation.  He reports that he does have a history of dyspnea with exertion, but this feels different.  He does note that he goes to the gym regularly and was able to complete 2 miles yesterday on an exercise bike.  He also smokes 1 pack of cigarettes per day.  He reports associated subjective fevers over the last few days and a nonproductive cough.  He denies leg swelling, chest pain, palpitations, headache, abdominal pain, nausea, vomiting, diarrhea, or rash.  No treatment prior to arrival.  He is followed by cardiology for severe mitral valve stenosis with regurgitation.  He has a history of pancreatitis.  No recent long travel.  No history of VTE.  No recent surgery or immobilization.  No known or suspected COVID-19 contacts.  He is a current, every day smoker.      The history is provided by the patient. No language interpreter was used.  Shortness of Breath Associated symptoms: cough and fever   Associated symptoms: no abdominal pain, no chest pain, no diaphoresis, no headaches, no rash, no sore throat, no vomiting and no wheezing     Past Medical History:  Diagnosis Date   Hypertension      Mild aortic stenosis 08/15/2017   Mitral valve stenosis, severe 08/15/2017   Moderate mitral regurgitation 08/15/2017   Rheumatic mitral stenosis with regurgitation 08/22/2016   Echo 08/08/16:  Severe mitral stenosis (mean gradient 17 mmHg).  Moderate mitral regurgitation.    Patient Active Problem List   Diagnosis Date Noted   Pre-operative clearance 06/11/2019   Mitral valve stenosis, severe 08/15/2017   Moderate mitral regurgitation 08/15/2017   Mild aortic stenosis 08/15/2017   Abdominal pain, epigastric 01/02/2017   Non-intractable vomiting with nausea 01/02/2017   Rheumatic mitral stenosis with regurgitation 08/22/2016   Hypertension, benign 01/09/2014   Undiagnosed cardiac murmurs 01/09/2014   Accelerated hypertension 03/11/2013   Family history of stroke 03/11/2013   Essential hypertension 03/11/2013   Smoker 03/11/2013   Dental caries 03/11/2013   Chronic periodontitis, unspecified 03/11/2013    Past Surgical History:  Procedure Laterality Date   BILATERAL KNEE ARTHROSCOPY          Home Medications    Prior to Admission medications   Medication Sig Start Date End Date Taking? Authorizing Provider  amLODipine (NORVASC) 10 MG tablet TAKE 1 TABLET(10 MG) BY MOUTH DAILY Patient taking differently: Take 10 mg by mouth daily.  07/09/18  Yes Skeet Latch, MD  chlorthalidone (HYGROTON) 25 MG tablet TAKE 1 TABLET(25 MG) BY MOUTH DAILY Patient taking differently: Take 25 mg by mouth daily.  07/09/18  Yes Skeet Latch, MD  ibuprofen (ADVIL) 200  MG tablet Take 600 mg by mouth every 6 (six) hours as needed for moderate pain.   Yes [provider]  lisinopril (PRINIVIL,ZESTRIL) 10 MG tablet TAKE 1 TABLET(10 MG) BY MOUTH DAILY Patient taking differently: Take 10 mg by mouth daily.  10/29/18  Yes Chilton Si, MD  albuterol (VENTOLIN HFA) 108 (90 Base) MCG/ACT inhaler Inhale 2 puffs into the lungs every 4 (four) hours as needed for  wheezing or shortness of breath. 07/28/19   Sidnie Swalley A, PA-C  amoxicillin-clavulanate (AUGMENTIN) 875-125 MG tablet Take 1 tablet by mouth 2 (two) times daily for 5 days. 07/28/19 08/02/19  Jordanna Hendrie A, PA-C  azithromycin (ZITHROMAX Z-PAK) 250 MG tablet Take 2 tablet on the first day then 1 tablet daily 07/28/19   Derelle Cockrell A, PA-C    Family History Family History  Problem Relation Age of Onset   Hypertension Mother    Hypertension Father    Stroke Father     Social History Social History   Tobacco Use   Smoking status: Current Every Day Smoker    Packs/day: 1.00    Types: Cigarettes   Smokeless tobacco: Never Used  Substance Use Topics   Alcohol use: No   Drug use: No     Allergies   Patient has no known allergies.   Review of Systems Review of Systems  Constitutional: Positive for fever. Negative for appetite change, chills and diaphoresis.  HENT: Negative for congestion and sore throat.   Respiratory: Positive for cough and shortness of breath. Negative for wheezing.   Cardiovascular: Negative for chest pain.  Gastrointestinal: Negative for abdominal pain, diarrhea, nausea and vomiting.  Genitourinary: Negative for dysuria.  Musculoskeletal: Negative for back pain.  Skin: Negative for rash.  Allergic/Immunologic: Negative for immunocompromised state.  Neurological: Negative for dizziness, syncope, weakness, numbness and headaches.  Psychiatric/Behavioral: Negative for confusion.   Physical Exam Updated Vital Signs BP 140/86    Pulse 67    Temp 99.7 F (37.6 C) (Oral)    Resp (!) 21    Ht  (1.753 m)    Wt 82.6 kg    SpO2 91%    BMI 26.88 kg/m   Physical Exam Vitals signs and nursing note reviewed.  Constitutional:      General: He is not in acute distress.    Appearance: He is well-developed. He is not ill-appearing, toxic-appearing or diaphoretic.     Comments: Well-appearing  HENT:     Head: Normocephalic.  Eyes:      Conjunctiva/sclera: Conjunctivae normal.  Neck:     Musculoskeletal: Neck supple.  Cardiovascular:     Rate and Rhythm: Normal rate and regular rhythm.     Pulses: Normal pulses.     Heart sounds: Murmur present. No friction rub. No gallop.   Pulmonary:     Effort: Pulmonary effort is normal. No respiratory distress.     Breath sounds: No stridor.     Comments: Lungs are clear to auscultation bilaterally without increased work of breathing. Chest:     Chest wall: No tenderness.  Abdominal:     General: There is no distension.     Palpations: Abdomen is soft. There is no mass.     Tenderness: There is no abdominal tenderness. There is no right CVA tenderness, left CVA tenderness, guarding or rebound.     Hernia: No hernia is present.  Musculoskeletal:     Right lower leg: No edema.     Left lower leg: No  edema.     Comments: No peripheral edema  Skin:    General: Skin is warm and dry.  Neurological:     Mental Status: He is alert.  Psychiatric:        Behavior: Behavior normal.      ED Treatments / Results  Labs (all labs ordered are listed, but only abnormal results are displayed) Labs Reviewed  CBC WITH DIFFERENTIAL/PLATELET - Abnormal; Notable for the following components:      Result Value   WBC 20.5 (*)    Hemoglobin 12.2 (*)    Neutro Abs 15.7 (*)    Monocytes Absolute 2.5 (*)    Abs Immature Granulocytes 0.12 (*)    All other components within normal limits  COMPREHENSIVE METABOLIC PANEL - Abnormal; Notable for the following components:   Glucose, Bld 106 (*)    Total Bilirubin 1.4 (*)    All other components within normal limits  BRAIN NATRIURETIC PEPTIDE - Abnormal; Notable for the following components:   B Natriuretic Peptide 221.3 (*)    All other components within normal limits  LIPASE, BLOOD    EKG EKG Interpretation  Date/Time:  Saturday July 27 2019 23:03:24 EDT Ventricular Rate:  91 PR Interval:    QRS Duration: 67 QT Interval:  422 QTC  Calculation: 520 R Axis:   60 Text Interpretation:  Sinus rhythm Left atrial enlargement RSR' in V1 or V2, probably normal variant Borderline repolarization abnormality Prolonged QT interval Baseline wander in lead(s) V5 NO STEMI. No old tracing to compare Confirmed by Drema Pryardama, Pedro [phone]([id number]) on 07/28/2019 1:29:50 AM   Radiology Dg Chest Portable 1 View  Result Date: 07/28/2019 CLINICAL DATA:  Shortness of breath. EXAM: PORTABLE CHEST 1 VIEW COMPARISON:  12/12/2013 FINDINGS: Normal heart size with unchanged mediastinal contours. Vague opacities in the left mid lower lung zone. No confluent airspace disease. No evidence of pulmonary edema, large pleural effusion or pneumothorax. IMPRESSION: Vague opacities in the left mid lower lung zone, may be atelectasis or pneumonia in the appropriate clinical setting. Electronically Signed   By: Narda RutherfordMelanie  Sanford M.D.   On: 07/28/2019 00:15    Procedures Procedures (including critical care time)  Medications Ordered in ED Medications - No data to display   Initial Impression / Assessment and Plan / ED Course  I have reviewed the triage vital signs and the nursing notes.  Pertinent labs & imaging results that were available during my care of the patient were reviewed by me and considered in my medical decision making (see chart for details).        53 year old male with a history of severe mitral valve stenosis, rheumatic mitral stenosis with regurgitation, HTN presenting with shortness of breath, onset over the last 24 hours that worsened earlier tonight.  He had difficulty laying flat.  He does not appear volume overloaded and has no peripheral edema.  He has no chest pain.  He also reports subjective fever and chills.  No known or suspected COVID-19 contacts.  Vital signs are reassuring in the ER.  SaO2 has been in the low 90s, but he has not been hypoxic, even with ambulation.  He has a leukocytosis of 20.5 with elevated neutrophil count.  Labs are  otherwise unremarkable.  Chest x-ray with vague opacity in the left mid lower lung zone concerning for atelectasis or pneumonia in the appropriate clinical setting.  Patient was discussed with Dr. Eudelia Bunchardama, attending physician.  He does have a history of rheumatic heart disease.  I  have a low suspicion for pericarditis or myocarditis at this time.  Although the patient is not PERC negative due to his age, low suspicion for PE as he has had no recent long travel or other risk factors.  Doubt ACS or COVID-19.  We will treat the patient with Augmentin and azithromycin since he is a current, every day smoker for community-acquired pneumonia.  Will give albuterol.  He has been advised to follow-up with his primary care provider.  All questions answered.  He is hemodynamically stable and in no acute distress.  Safe for discharge home with outpatient follow-up.  Final Clinical Impressions(s) / ED Diagnoses   Final diagnoses:  Pneumonia of left lower lobe due to infectious organism    ED Discharge Orders         Ordered    amoxicillin-clavulanate (AUGMENTIN) 875-125 MG tablet  2 times daily     07/28/19 0657    azithromycin (ZITHROMAX Z-PAK) 250 MG tablet     07/28/19 0657    albuterol (VENTOLIN HFA) 108 (90 Base) MCG/ACT inhaler  Every 4 hours PRN     07/28/19 0659           Barkley Boards, PA-C 07/28/19 [id number]    Nira Conn, MD 07/29/19 [phone]

## 2019-07-29 ENCOUNTER — Other Ambulatory Visit: Payer: Self-pay | Admitting: Cardiovascular Disease

## 2019-07-29 DIAGNOSIS — I10 Essential (primary) hypertension: Secondary | ICD-10-CM

## 2019-08-14 ENCOUNTER — Other Ambulatory Visit: Payer: Self-pay

## 2019-08-14 ENCOUNTER — Ambulatory Visit (HOSPITAL_COMMUNITY): Payer: No Typology Code available for payment source | Attending: Cardiovascular Disease

## 2019-08-14 DIAGNOSIS — I10 Essential (primary) hypertension: Secondary | ICD-10-CM | POA: Diagnosis present

## 2019-08-14 DIAGNOSIS — Z01818 Encounter for other preprocedural examination: Secondary | ICD-10-CM | POA: Diagnosis present

## 2019-08-14 DIAGNOSIS — F17200 Nicotine dependence, unspecified, uncomplicated: Secondary | ICD-10-CM | POA: Diagnosis present

## 2019-08-14 DIAGNOSIS — I050 Rheumatic mitral stenosis: Secondary | ICD-10-CM | POA: Insufficient documentation

## 2019-08-16 ENCOUNTER — Other Ambulatory Visit: Payer: Self-pay | Admitting: Cardiovascular Disease

## 2019-08-29 ENCOUNTER — Other Ambulatory Visit: Payer: Self-pay

## 2019-08-29 ENCOUNTER — Encounter: Payer: Self-pay | Admitting: Cardiovascular Disease

## 2019-08-29 ENCOUNTER — Ambulatory Visit (INDEPENDENT_AMBULATORY_CARE_PROVIDER_SITE_OTHER): Payer: No Typology Code available for payment source | Admitting: Cardiovascular Disease

## 2019-08-29 VITALS — BP 143/89 | HR 78 | Temp 97.3°F | Ht 69.0 in | Wt 181.6 lb

## 2019-08-29 DIAGNOSIS — I050 Rheumatic mitral stenosis: Secondary | ICD-10-CM | POA: Diagnosis not present

## 2019-08-29 DIAGNOSIS — I10 Essential (primary) hypertension: Secondary | ICD-10-CM | POA: Diagnosis not present

## 2019-08-29 NOTE — Progress Notes (Signed)
Cardiology Office Note   Date:  08/29/2019   ID:  Melvin Burns, DOB [birth year], MRN [id number]  PCP:  Quentin Angst, MD  Cardiologist:   Chilton Si, MD   No chief complaint on file.   History of Present Illness: Melvin Burns is a 53 y.o. male with Rheumatic mitral valve disease, moderate to severe mitral stenosis and moderate mitral regurgitation, mild aortic stenosis, hypertension and tobacco abuse who presents for follow up.  Melvin Burns was first seen 07/05/16 due to an abnormal EKG.  He saw his PCP on 8/23 and was noted to have PACs as well as a PVC. He was also noted to have LVH with repolarization abnormality.  He was referred to cardiology for further evaluation.  At that appointment he was noted to have a murmur on exam.  He had an echo 08/08/16 that showed LVEF 60-65%.  He had severe mitral stenosis with a mean gradient of 17 mmHg and moderate mitral regurgitation.  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  His blood presure was elevated and lisinopril was added to his regimen.    At his last appointment Melvin Burns was doing well.  He was going to the gym 5-7 days per week and working out for 2 hours doing cardio and lifting weights.  He had no chest pain or shortness of breath with this activity.  Since then he was treated for pneumonia 07/2019.  He thinks that his breathing is back to normal but he feels exhausted when he goes up stairs.  He has started back working out but has to stop because he is tired.  He sometimes notices palpitations after eating.  This occurs approximately monthly and lasts for less than a minute.  There is no associated shortness of breath, lightheadedness or dizziness.  He denies palpitations with exertion. Melvin Burns denies lower extremity edema, orthopnea, or PND.  He does not check his BP regularly at home.  He forgot to take his medication this AM.   Past Medical History:  Diagnosis Date  . Hypertension   . Mild aortic  stenosis 08/15/2017  . Mitral valve stenosis, severe 08/15/2017  . Moderate mitral regurgitation 08/15/2017  . 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  Moderate mitral regurgitation.    Past Surgical History:  Procedure Laterality Date  . BILATERAL KNEE ARTHROSCOPY       Current Outpatient Medications  Medication Sig Dispense Refill  . albuterol (VENTOLIN HFA) 108 (90 Base) MCG/ACT inhaler Inhale 2 puffs into the lungs every 4 (four) hours as needed for wheezing or shortness of breath. 6.7 g 0  . amLODipine (NORVASC) 10 MG tablet TAKE 1 TABLET(10 MG) BY MOUTH DAILY 90 tablet 2  . azithromycin (ZITHROMAX Z-PAK) 250 MG tablet Take 2 tablet on the first day then 1 tablet daily 6 each 0  . chlorthalidone (HYGROTON) 25 MG tablet TAKE 1 TABLET(25 MG) BY MOUTH DAILY 90 tablet 2  . ibuprofen (ADVIL) 200 MG tablet Take 600 mg by mouth every 6 (six) hours as needed for moderate pain.    Marland Kitchen lisinopril (ZESTRIL) 10 MG tablet TAKE 1 TABLET(10 MG) BY MOUTH DAILY 90 tablet 1   No current facility-administered medications for this visit.     Allergies:   Patient has no known allergies.    Social History:  The patient  reports that he has been smoking cigarettes. He has been smoking  about 1.00 pack per day. He has never used smokeless tobacco. He reports that he does not drink alcohol or use drugs.   Family History:  The patient's family history includes Hypertension in his father and mother; Stroke in his father.    ROS:  Please see the history of present illness.   Otherwise, review of systems are positive for none.   All other systems are reviewed and negative.    PHYSICAL EXAM: VS:  BP (!) 143/89   Pulse 78   Temp (!) 97.3 F (36.3 C)   Ht  (1.753 m)   Wt 181 lb 9.6 oz (82.4 kg)   SpO2 98%   BMI 26.82 kg/m  , BMI Body mass index is 26.82 kg/m. GENERAL:  Well appearing.  No acute distress HEENT:  Pupils equal round and reactive, fundi not visualized, oral mucosa unremarkable NECK:  No jugular venous distention, waveform within normal limits, carotid upstroke brisk and symmetric, no bruits LUNGS:  Clear to auscultation bilaterally HEART:  RRR.  PMI not displaced or sustained,S1 and S2 within normal limits, no S3, no S4, no clicks, no rubs, II/VI holosystolic murmur at the apex.  III/VI systolic murmur at the LUSB. ABD:  Flat, positive bowel sounds normal in frequency in pitch, no bruits, no rebound, no guarding, no midline pulsatile mass, no hepatomegaly, no splenomegaly EXT:  2 plus pulses throughout, no edema, no cyanosis no clubbing SKIN:  No rashes no nodules NEURO:  Cranial nerves II through XII grossly intact, motor grossly intact throughout PSYCH:  Cognitively intact, oriented to person place and time   EKG:  EKG is ordered today. The ekg ordered 12/15/16 demonstrates sinus rhythm.  Rate 81 bpm.  PACs.  LVH with repolariazation abnormality.  08/15/17: Sinus rhythm.  Rate 76 bpm.  LVH with secondary repolarization abnormality. 06/06/18: Sinus rhythm.  Rate 73 bpm.  LVH. 08/29/2019: Sinus rhythm.  PAC.  Rate 75 bpm.  Left atrial enlargement.  Inferior T wave flattening.  Echo 08/08/16: Study Conclusions  - Left ventricle: The cavity size was normal. There was mild focal   basal hypertrophy of the septum. Systolic function was normal.   The estimated ejection fraction was in the range of 60% to 65%.   Wall motion was normal; there were no regional wall motion   abnormalities. Features are consistent with a pseudonormal left   ventricular filling pattern, with concomitant abnormal relaxation   and increased filling pressure (grade 2 diastolic dysfunction).   Doppler parameters are consistent with elevated ventricular   end-diastolic filling pressure. - Aortic valve: Transvalvular velocity was minimally increased.   There was mild stenosis. There was mild regurgitation. Peak    gradient (S): 25 mm Hg. - Aortic root: The aortic root was normal in size. - Mitral valve: Severely thickened and mildly calcified mitral   valve that is rheumatic in apearance. There is limited leaflet   separation with severe mitral stenosis and moderate   regurgitation. The findings are consistent with severe stenosis.   There was moderate regurgitation. Mean gradient (D): 17 mm Hg.   Peak gradient (D): 40 mm Hg. - Left atrium: The atrium was severely dilated. - Right ventricle: Systolic function was normal. - Tricuspid valve: There was mild regurgitation. - Pulmonic valve: There was no regurgitation. - Pulmonary arteries: Systolic pressure was within the normal   range. - Inferior vena cava: The vessel was normal in size. - Pericardium, extracardiac: There was no pericardial effusion.  Impressions:  - Severely  thickened and mildly calcified mitral valve that is   rheumatic in apearance. There is limited leaflet eparation with   severe mitral stenosis and moderate regurgitation.  Echo 08/14/19: IMPRESSIONS   1. Left ventricular ejection fraction, by visual estimation, is 60 to 65%. The left ventricle has normal function. There is mildly increased left ventricular hypertrophy.  2. Left ventricular diastolic parameters are indeterminate.  3. Global right ventricle has normal systolic function.The right ventricular size is normal. No increase in right ventricular wall thickness.  4. Left atrial size was normal.  5. Right atrial size was normal.  6. Severe calcification of the mitral valve leaflet(s).  7. Severe thickening of the mitral valve leaflet(s).  8. Severely decreased mobility of the mitral valve leaflets.  9. The mitral valve is rheumatic. Trace mitral valve regurgitation. Moderate-severe mitral stenosis. 10. The tricuspid valve is normal in structure. Tricuspid valve regurgitation is trivial. 11. The aortic valve is tricuspid. Aortic valve regurgitation is not  visualized. moderate sclerosis. 12. Small gradient across aortic valve without stenosis. 13. The pulmonic valve was grossly normal. Pulmonic valve regurgitation is trivial. 14. The inferior vena cava is normal in size with greater than 50% respiratory variability, suggesting right atrial pressure of 3 mmHg.   Recent Labs: 07/28/2019: ALT 14; B Natriuretic Peptide 221.3; BUN 13; Creatinine, Ser 0.96; Hemoglobin 12.2; Platelets 186; Potassium 3.7; Sodium 138    Lipid Panel    Component Value Date/Time   CHOL 157 06/01/2016 1756   TRIG 129 06/01/2016 1756   HDL 47 06/01/2016 1756   CHOLHDL 3.3 06/01/2016 1756   VLDL 26 06/01/2016 1756   LDLCALC 84 06/01/2016 1756      Wt Readings from Last 3 Encounters:  08/29/19 181 lb 9.6 oz (82.4 kg)  07/27/19 182 lb (82.6 kg)  06/11/19 170 lb (77.1 kg)      ASSESSMENT AND PLAN:  # Rheumatic valve disease: # Severe mitral stenosis: # Moderate mitral regurgitation: # Mild aortic stenosis: Mr. Forgette has severe mitral stenosis and moderate mitral regurgitation.   He is very physically active at baseline.  This makes it very difficult to determine if he is symptomatic. (Class C vs Class D valvular heart disease)  Since his pneumonia he gets winded with minimal exertion.  This may be due to his pneumonia or his mitral valve disease.  Interestingly, the mean gradient on his most recent echo was only 8 mmHg.  It has been as high as 17 mmHg.  I suspect that it is truly severe.  We will get an exercise stress echo and evaluate the mitral gradient with exercise.  I suspect that there is a significant increase with exercise.  If this is the case, I would consider him class D valvular heart disease and refer for balloon valvuloplasty vs replacement.  There was only trace MR on his last echo but it has been moderate in the past.  He would need TEE to better assess.  # Hypertension: Blood pressure is typically well-controlled but he hasn't taken his  medication yet.  Continue amlodipine, chlorthalidone, and lisinopril.   # Tobacco abuse:  Encouraged cessation.  He liked nicotine gum in the past but was trying to use one piece daily.  We discussed the fact that this should be used every 1-2 hours and parked in the cheek.  He wants to try this.  We will also refer him to our care guide.  We discussed smoking cessation for 5 minutes.    Time spent:  30 minutes-Greater than 50% of this time was spent in counseling, explanation of diagnosis, planning of further management, and coordination of care.  Complex decision making.    Current medicines are reviewed at length with the patient today.  The patient does not have concerns regarding medicines.  The following changes have been made:    Labs/ tests ordered today include:   No orders of the defined types were placed in this encounter.    Disposition:   FU with Herny Scurlock C. Duke Salviaandolph, MD, [facility] in 6 weeks.     Signed, Jackolyn Geron C. Duke Salviaandolph, MD, [facility]  08/29/2019 4:19 PM    [facility]

## 2019-08-29 NOTE — Patient Instructions (Addendum)
Medication Instructions:  Your physician recommends that you continue on your current medications as directed. Please refer to the Current Medication list given to you today.  *If you need a refill on your cardiac medications before your next appointment, please call your pharmacy*  Lab Work: YOU WILL NEED A COVID SCREENING 3 DAYS PRIOR TO YOUR STRESS ECHO   Testing/Procedures: Your physician has requested that you have a stress echocardiogram. For further information please visit [email or web address]. Please follow instruction sheet as given.  THE OFFICE WILL CALL YOU WITH A DATE, TIME, AND LOCATION   Follow-Up: At [facility], you and your health needs are our priority.  As part of our continuing mission to provide you with exceptional heart care, we have created designated Provider Care Teams.  These Care Teams include your primary Cardiologist (physician) and Advanced Practice Providers (APPs -  Physician Assistants and Nurse Practitioners) who all work together to provide you with the care you need, when you need it.  Your next appointment:   6 week(s)  The format for your next appointment:   In Person  Provider:   You may see Skeet Latch, MD or one of the following Advanced Practice Providers on your designated Care Team:    Kerin Ransom, PA-C  [place], [place]  Coletta Memos, [place]     Exercise Stress Echocardiogram  An exercise stress echocardiogram is a test that checks how well your heart is working. For this test, you will walk on a treadmill to make your heart beat faster. This test uses sound waves (ultrasound) and a computer to make pictures (images) of your heart. These pictures will be taken before you exercise and after you exercise. What happens before the procedure?  Follow instructions from your doctor about what you cannot eat or drink before the test.  Do not drink or eat anything that has caffeine in it. Stop having caffeine for 24 hours before  the test.  Ask your doctor about changing or stopping your normal medicines. This is important if you take diabetes medicines or blood thinners. Ask your doctor if you should take your medicines with water before the test.  If you use an inhaler, bring it to the test.  Do not use any products that have nicotine or tobacco in them, such as cigarettes and e-cigarettes. Stop using them for 4 hours before the test. If you need help quitting, ask your doctor.  Wear comfortable shoes and clothing. What happens during the procedure?  You will be hooked up to a TV screen. Your doctor will watch the screen to see how fast your heart beats during the test.  Before you exercise, a computer will make a picture of your heart. To do this: ? A gel will be put on your chest. ? A wand will be moved over the gel. ? Sound waves from the wand will go to the computer to make the picture.  Your will start walking on a treadmill. The treadmill will start at a slow speed. It will get faster a little bit at a time. When you walk faster, your heart will beat faster.  The treadmill will be stopped when your heart is working hard.  You will lie down right away so another picture of your heart can be taken.  The test will take 30-60 minutes. What happens after the procedure?  Your heart rate and blood pressure will be watched after the test.  If your doctor says that you can, you may: ?  Eat what you usually eat. ? Do your normal activities. ? Take medicines like normal. Summary  An exercise stress echocardiogram is a test that checks how well your heart is working.  Follow instructions about what you cannot eat or drink before the test. Ask your doctor if you should take your normal medicines before the test.  Stop having caffeine for 24 hours before the test. Do not use anything with nicotine or tobacco in it for 4 hours before the test.  A computer will take a picture of your heart before you walk on a  treadmill. It will take another picture when you are done walking.  Your heart rate and blood pressure will be watched after the test. This information is not intended to replace advice given to you by your health care provider. Make sure you discuss any questions you have with your health care provider. Document Released: 07/24/2009 Document Revised: 07/21/2016 Document Reviewed: 06/19/2016 Elsevier Patient Education  2020 [employer].

## 2019-08-30 ENCOUNTER — Encounter: Payer: Self-pay | Admitting: Cardiovascular Disease

## 2019-09-02 ENCOUNTER — Other Ambulatory Visit: Payer: Self-pay | Admitting: *Deleted

## 2019-09-02 DIAGNOSIS — I050 Rheumatic mitral stenosis: Secondary | ICD-10-CM

## 2019-09-02 DIAGNOSIS — I340 Nonrheumatic mitral (valve) insufficiency: Secondary | ICD-10-CM

## 2019-09-03 ENCOUNTER — Encounter (HOSPITAL_COMMUNITY): Payer: Self-pay | Admitting: Cardiovascular Disease

## 2019-09-03 ENCOUNTER — Encounter (HOSPITAL_COMMUNITY): Payer: No Typology Code available for payment source

## 2019-09-04 ENCOUNTER — Telehealth: Payer: Self-pay | Admitting: Cardiovascular Disease

## 2019-09-04 NOTE — Telephone Encounter (Signed)
Patient returning call to schedule Treadmill Stress Test.

## 2019-09-04 NOTE — Telephone Encounter (Signed)
Patient returning call to schedule treadmill stress test.

## 2019-09-25 ENCOUNTER — Telehealth (HOSPITAL_COMMUNITY): Payer: Self-pay | Admitting: *Deleted

## 2019-09-25 NOTE — Telephone Encounter (Signed)
Patient given detailed instructions per Stress Test Requisition Sheet for test on 09/2119 at 2:00.Patient Notified to arrive 30 minutes early, and that it is imperative to arrive on time for appointment to keep from having the test rescheduled.  Patient verbalized understanding. Melvin Burns

## 2019-09-26 ENCOUNTER — Other Ambulatory Visit (HOSPITAL_COMMUNITY)
Admission: RE | Admit: 2019-09-26 | Discharge: 2019-09-26 | Disposition: A | Discharge status: Home / Self Care | Payer: No Typology Code available for payment source | Source: Ambulatory Visit | Attending: Cardiovascular Disease | Admitting: Cardiovascular Disease

## 2019-09-26 DIAGNOSIS — Z01812 Encounter for preprocedural laboratory examination: Secondary | ICD-10-CM | POA: Insufficient documentation

## 2019-09-26 DIAGNOSIS — Z20828 Contact with and (suspected) exposure to other viral communicable diseases: Secondary | ICD-10-CM | POA: Insufficient documentation

## 2019-09-27 LAB — NOVEL CORONAVIRUS, NAA (HOSP ORDER, SEND-OUT TO REF LAB; TAT 18-24 HRS): SARS-CoV-2, NAA: NOT DETECTED

## 2019-09-30 ENCOUNTER — Other Ambulatory Visit: Payer: Self-pay

## 2019-09-30 ENCOUNTER — Other Ambulatory Visit (HOSPITAL_COMMUNITY): Payer: No Typology Code available for payment source

## 2019-10-17 ENCOUNTER — Other Ambulatory Visit (HOSPITAL_COMMUNITY): Payer: No Typology Code available for payment source

## 2019-10-23 ENCOUNTER — Telehealth (HOSPITAL_COMMUNITY): Payer: Self-pay | Admitting: *Deleted

## 2019-10-23 NOTE — Telephone Encounter (Signed)
Attempted to speak to patient and does not speak [place]. Left message on voicemail on wife # per Dpr instructions in reference to upcoming appointment scheduled for 01/18. Phone number given for a call back so details instructions can be given. 2021

## 2019-10-24 ENCOUNTER — Telehealth (HOSPITAL_COMMUNITY): Payer: Self-pay | Admitting: *Deleted

## 2019-10-24 ENCOUNTER — Inpatient Hospital Stay (HOSPITAL_COMMUNITY): Admission: RE | Admit: 2019-10-24 | Payer: No Typology Code available for payment source | Source: Ambulatory Visit

## 2019-10-24 NOTE — Telephone Encounter (Signed)
Left message for Rayce Brahmbhatt (patient's wife) stating the need to know patient's language in order to have an interpreter assigned for his appointment coming up on Monday 10/28/19 at 2:00.  Awaiting returned call.

## 2019-10-25 ENCOUNTER — Telehealth: Payer: Self-pay | Admitting: Cardiovascular Disease

## 2019-10-25 NOTE — Telephone Encounter (Signed)
Patient needs stress echo rescheduled due to not getting a covid test. I saw you were the one that scheduled this appointment so I wasn't sure who to reach out to for further assistance. I called the number I was given [phone]) but no one answered.

## 2019-10-28 ENCOUNTER — Other Ambulatory Visit (HOSPITAL_COMMUNITY): Payer: No Typology Code available for payment source

## 2019-10-29 ENCOUNTER — Telehealth (HOSPITAL_COMMUNITY): Payer: Self-pay | Admitting: Radiology

## 2019-10-29 NOTE — Telephone Encounter (Signed)
Just an FYI. We have made several attempts to contact this patient including sending a letter to schedule or reschedule their echocardiogram. We will be removing the patient from the echo WQ.   1.18.21 no show 12.21.20 no show 11.24.20 @ 10:01am both # are the same  - lm on home vm  - gesila 11.24.20 mail reminder letter Melvin Burns  Thank you

## 2019-11-05 ENCOUNTER — Ambulatory Visit: Payer: No Typology Code available for payment source | Admitting: Cardiovascular Disease

## 2019-11-19 ENCOUNTER — Telehealth (HOSPITAL_COMMUNITY): Payer: Self-pay | Admitting: *Deleted

## 2019-11-19 NOTE — Telephone Encounter (Signed)
Patient given detailed instructions per Stress Test Requisition Sheet for test on 11/25/19 at 2:00.Patient Notified to arrive 30 minutes early, and that it is imperative to arrive on time for appointment to keep from having the test rescheduled.  Patient verbalized understanding. Daneil Dolin

## 2019-11-21 ENCOUNTER — Other Ambulatory Visit (HOSPITAL_COMMUNITY)
Admission: RE | Admit: 2019-11-21 | Discharge: 2019-11-21 | Disposition: A | Discharge status: Home / Self Care | Payer: No Typology Code available for payment source | Source: Ambulatory Visit | Attending: Cardiovascular Disease | Admitting: Cardiovascular Disease

## 2019-11-21 DIAGNOSIS — Z01812 Encounter for preprocedural laboratory examination: Secondary | ICD-10-CM | POA: Diagnosis present

## 2019-11-21 DIAGNOSIS — Z20822 Contact with and (suspected) exposure to covid-19: Secondary | ICD-10-CM | POA: Insufficient documentation

## 2019-11-21 LAB — SARS CORONAVIRUS 2 (TAT 6-24 HRS): SARS Coronavirus 2: NEGATIVE

## 2019-11-25 ENCOUNTER — Ambulatory Visit (HOSPITAL_COMMUNITY): Payer: No Typology Code available for payment source

## 2019-11-25 ENCOUNTER — Ambulatory Visit (HOSPITAL_COMMUNITY): Payer: No Typology Code available for payment source | Attending: Cardiology

## 2019-11-25 ENCOUNTER — Other Ambulatory Visit: Payer: Self-pay

## 2019-11-25 DIAGNOSIS — I050 Rheumatic mitral stenosis: Secondary | ICD-10-CM | POA: Insufficient documentation

## 2019-11-25 DIAGNOSIS — I340 Nonrheumatic mitral (valve) insufficiency: Secondary | ICD-10-CM | POA: Insufficient documentation

## 2019-11-28 ENCOUNTER — Other Ambulatory Visit: Payer: Self-pay | Admitting: Cardiovascular Disease

## 2019-11-28 DIAGNOSIS — I050 Rheumatic mitral stenosis: Secondary | ICD-10-CM

## 2019-11-29 ENCOUNTER — Telehealth: Payer: Self-pay | Admitting: *Deleted

## 2019-11-29 DIAGNOSIS — I050 Rheumatic mitral stenosis: Secondary | ICD-10-CM

## 2019-11-29 NOTE — Telephone Encounter (Signed)
Patient needs updated appointment prior to procedures. He will keep his scheduled appt with Dr Duke Salvia on 3/2 and will try to arrange procedure ont 3/8 as requested

## 2019-11-29 NOTE — Telephone Encounter (Signed)
-----   Message from Chilton Si, MD sent at 11/28/2019  6:24 PM EST ----- Unfortunately the gradient was not measured across his mitral valve with exercise.  Nevertheless, he developed chest pain and VT, which is symptomatic mitral valve disease.  He needs a TEE and LHC/RHC.  Discussed with the patient and he is agreeable.  Please have him seen by Dr. [name] after these procedures.

## 2019-11-29 NOTE — Telephone Encounter (Signed)
    Melvin Si, MD  11/28/2019 6:24 PM EST    Unfortunately the gradient was not measured across his mitral valve with exercise. Nevertheless, he developed chest pain and VT, which is symptomatic mitral valve disease. He needs a TEE and LHC/RHC. Discussed with the patient and he is agreeable. Please have him seen by Dr. [name] after these procedures.

## 2019-12-05 ENCOUNTER — Ambulatory Visit: Payer: No Typology Code available for payment source | Admitting: Cardiovascular Disease

## 2019-12-06 NOTE — Telephone Encounter (Signed)
Patient scheduled for TEE at 11:00 am 3/8 cath to follow Will review instructions at follow up visit 3/2

## 2019-12-06 NOTE — Addendum Note (Signed)
Addended by: Regis Bill B on: 12/06/2019 04:14 PM   Modules accepted: Orders

## 2019-12-10 ENCOUNTER — Ambulatory Visit (INDEPENDENT_AMBULATORY_CARE_PROVIDER_SITE_OTHER): Payer: No Typology Code available for payment source | Admitting: Cardiovascular Disease

## 2019-12-10 ENCOUNTER — Other Ambulatory Visit: Payer: Self-pay

## 2019-12-10 ENCOUNTER — Encounter: Payer: Self-pay | Admitting: Cardiovascular Disease

## 2019-12-10 VITALS — BP 122/72 | HR 85 | Ht 69.0 in | Wt 184.2 lb

## 2019-12-10 DIAGNOSIS — Z01812 Encounter for preprocedural laboratory examination: Secondary | ICD-10-CM | POA: Diagnosis not present

## 2019-12-10 DIAGNOSIS — I050 Rheumatic mitral stenosis: Secondary | ICD-10-CM

## 2019-12-10 DIAGNOSIS — F17200 Nicotine dependence, unspecified, uncomplicated: Secondary | ICD-10-CM

## 2019-12-10 DIAGNOSIS — I10 Essential (primary) hypertension: Secondary | ICD-10-CM | POA: Diagnosis not present

## 2019-12-10 DIAGNOSIS — I340 Nonrheumatic mitral (valve) insufficiency: Secondary | ICD-10-CM | POA: Diagnosis not present

## 2019-12-10 MED ORDER — IBUPROFEN 800 MG PO TABS: 800.0000 mg | ORAL_TABLET | Freq: Three times a day (TID) | ORAL | 0 refills | Status: AC

## 2019-12-10 NOTE — Progress Notes (Signed)
Cardiology Office Note   Date:  12/10/2019   ID:  Melvin Burns, DOB 1966-01-04, MRN [id number]  PCP:  Tresa Garter, Melvin Burns  Cardiologist:   Melvin Latch, Melvin Burns   No chief complaint on file.   History of Present Illness: Melvin Burns is a 54 y.o. male with Rheumatic mitral valve disease, NSVT, severe mitral stenosis and moderate mitral regurgitation, mild aortic stenosis, hypertension and tobacco abuse who presents for follow up.  Melvin Burns was first seen 07/05/16 due to an abnormal EKG.  He saw his PCP on 8/23 and was noted to have PACs as well as a PVC. He was also noted to have LVH with repolarization abnormality.  He was referred to cardiology for further evaluation.  At that appointment he was noted to have a murmur on exam.  He had an echo 08/08/16 that showed LVEF 60-65%.  He had severe mitral stenosis with a mean gradient of 17 mmHg and moderate mitral regurgitation.  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  His blood presure was elevated and lisinopril was added to his regimen.  He did well clinically and exercise at the gym 5 to 7 days/week without any chest pain or shortness of breath.  He was treated for pneumonia 07/2019.  Echo at that time revealed a mean mitral valve gradient of 8 mmHg.  After that he stopped going to the gym as regularly.  His last appointment he reported some increased shortness of breath.  He had no edema, orthopnea, or PND.  It was unclear whether his symptoms were due to progressive valvular disease or residual dyspnea from pneumonia.  He was referred for an exercise stress echo to see if his gradient increased with exercise.  Unfortunately, the gradient was not measured during the stress test.  It was negative for ischemia.  He did develop chest pain and short runs of VT.  Therefore it was felt that he has symptomatic mitral stenosis and we discussed referring him for TEE and cath in preparation for mitral valve replacement.  He presents  today for preprocedural assessment.  Since his last appointment Melvin Burns has been doing okay.  He has been working a lot so he is not he has not started back exercising.  He has been working 12-hour shifts and does not have energy when he gets home.  He denies any chest pain or shortness of breath lately.  This only occurs when he really exerts himself.  He has no lower extremity edema, orthopnea, or PND.  His only complaint today is pain in his left elbow.  He does not recall injuring it but it is tender to touch.   Past Medical History:  Diagnosis Date  . Hypertension   . Mild aortic stenosis 08/15/2017  . Mitral valve stenosis, severe 08/15/2017  . Moderate mitral regurgitation 08/15/2017  . 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  Moderate mitral regurgitation.    Past Surgical History:  Procedure Laterality Date  . BILATERAL KNEE ARTHROSCOPY       Current Outpatient Medications  Medication Sig Dispense Refill  . albuterol (VENTOLIN HFA) 108 (90 Base) MCG/ACT inhaler Inhale 2 puffs into the lungs every 4 (four) hours as needed for wheezing or shortness of breath. 6.7 g 0  . amLODipine (NORVASC) 10 MG tablet TAKE 1 TABLET(10 MG) BY MOUTH DAILY (Patient taking differently: Take 10 mg by mouth daily. ) 90  tablet 2  . chlorthalidone (HYGROTON) 25 MG tablet TAKE 1 TABLET(25 MG) BY MOUTH DAILY (Patient taking differently: Take 25 mg by mouth daily. ) 90 tablet 2  . ibuprofen (ADVIL) 200 MG tablet Take 400 mg by mouth every 6 (six) hours as needed for moderate pain.     Marland Kitchen lisinopril (ZESTRIL) 10 MG tablet TAKE 1 TABLET(10 MG) BY MOUTH DAILY (Patient taking differently: Take 10 mg by mouth daily. ) 90 tablet 1   No current facility-administered medications for this visit.    Allergies:   Patient has no known allergies.    Social History:  The patient  reports that he has been smoking cigarettes. He has  been smoking about 1.00 pack per day. He has never used smokeless tobacco. He reports that he does not drink alcohol or use drugs.   Family History:  The patient's family history includes Hypertension in his father and mother; Stroke in his father.    ROS:  Please see the history of present illness.   Otherwise, review of systems are positive for none.   All other systems are reviewed and negative.    PHYSICAL EXAM: VS:  BP 122/72   Pulse 85   Ht 5\' 9"  (1.753 m)   Wt 184 lb 3.2 oz (83.6 kg)   BMI 27.20 kg/m  , BMI Body mass index is 27.2 kg/m. GENERAL:  Well appearing.  No acute distress HEENT: Pupils equal round and reactive, fundi not visualized, oral mucosa unremarkable NECK:  No jugular venous distention, waveform within normal limits, carotid upstroke brisk and symmetric, no bruits LUNGS:  Clear to auscultation bilaterally HEART:  RRR.  PMI not displaced or sustained,S1 and S2 within normal limits, no S3, no S4, no clicks, no rubs, II/VI holosystolic murmur at the apex.  III/VI systolic murmur at the LUSB. ABD:  Flat, positive bowel sounds normal in frequency in pitch, no bruits, no rebound, no guarding, no midline pulsatile mass, no hepatomegaly, no splenomegaly EXT:  2 plus pulses throughout, no edema, no cyanosis no clubbing SKIN:  No rashes no nodules NEURO:  Cranial nerves II through XII grossly intact, motor grossly intact throughout PSYCH:  Cognitively intact, oriented to person place and time   EKG:  EKG is ordered today. The ekg ordered 12/15/16 demonstrates sinus rhythm.  Rate 81 bpm.  PACs.  LVH with repolariazation abnormality.  08/15/17: Sinus rhythm.  Rate 76 bpm.  LVH with secondary repolarization abnormality. 06/06/18: Sinus rhythm.  Rate 73 bpm.  LVH. 08/29/2019: Sinus rhythm.  PAC.  Rate 75 bpm.  Left atrial enlargement.  Inferior T wave flattening. 12/10/19: Sinus rhythm.  PACs.  Rate 85 bpm  LVH with repolarization abnormality  Echo 08/08/16: Study  Conclusions  - Left ventricle: The cavity size was normal. There was mild focal   basal hypertrophy of the septum. Systolic function was normal.   The estimated ejection fraction was in the range of 60% to 65%.   Wall motion was normal; there were no regional wall motion   abnormalities. Features are consistent with a pseudonormal left   ventricular filling pattern, with concomitant abnormal relaxation   and increased filling pressure (grade 2 diastolic dysfunction).   Doppler parameters are consistent with elevated ventricular   end-diastolic filling pressure. - Aortic valve: Transvalvular velocity was minimally increased.   There was mild stenosis. There was mild regurgitation. Peak   gradient (S): 25 mm Hg. - Aortic root: The aortic root was normal in size. - Mitral valve:  Severely thickened and mildly calcified mitral   valve that is rheumatic in apearance. There is limited leaflet   separation with severe mitral stenosis and moderate   regurgitation. The findings are consistent with severe stenosis.   There was moderate regurgitation. Mean gradient (D): 17 mm Hg.   Peak gradient (D): 40 mm Hg. - Left atrium: The atrium was severely dilated. - Right ventricle: Systolic function was normal. - Tricuspid valve: There was mild regurgitation. - Pulmonic valve: There was no regurgitation. - Pulmonary arteries: Systolic pressure was within the normal   range. - Inferior vena cava: The vessel was normal in size. - Pericardium, extracardiac: There was no pericardial effusion.  Impressions:  - Severely thickened and mildly calcified mitral valve that is   rheumatic in apearance. There is limited leaflet eparation with   severe mitral stenosis and moderate regurgitation.  Echo 08/14/19: IMPRESSIONS   1. Left ventricular ejection fraction, by visual estimation, is 60 to 65%. The left ventricle has normal function. There is mildly increased left ventricular hypertrophy.  2. Left  ventricular diastolic parameters are indeterminate.  3. Global right ventricle has normal systolic function.The right ventricular size is normal. No increase in right ventricular wall thickness.  4. Left atrial size was normal.  5. Right atrial size was normal.  6. Severe calcification of the mitral valve leaflet(s).  7. Severe thickening of the mitral valve leaflet(s).  8. Severely decreased mobility of the mitral valve leaflets.  9. The mitral valve is rheumatic. Trace mitral valve regurgitation. Moderate-severe mitral stenosis. 10. The tricuspid valve is normal in structure. Tricuspid valve regurgitation is trivial. 11. The aortic valve is tricuspid. Aortic valve regurgitation is not visualized. moderate sclerosis. 12. Small gradient across aortic valve without stenosis. 13. The pulmonic valve was grossly normal. Pulmonic valve regurgitation is trivial. 14. The inferior vena cava is normal in size with greater than 50% respiratory variability, suggesting right atrial pressure of 3 mmHg.   Recent Labs: 07/28/2019: ALT 14; B Natriuretic Peptide 221.3; BUN 13; Creatinine, Ser 0.96; Hemoglobin 12.2; Platelets 186; Potassium 3.7; Sodium 138    Lipid Panel    Component Value Date/Time   CHOL 157 06/01/2016 1756   TRIG 129 06/01/2016 1756   HDL 47 06/01/2016 1756   CHOLHDL 3.3 06/01/2016 1756   VLDL 26 06/01/2016 1756   LDLCALC 84 06/01/2016 1756      Wt Readings from Last 3 Encounters:  12/10/19 184 lb 3.2 oz (83.6 kg)  08/29/19 181 lb 9.6 oz (82.4 kg)  07/27/19 182 lb (82.6 kg)      ASSESSMENT AND PLAN:  # Rheumatic valve disease: # Severe mitral stenosis: # Moderate mitral regurgitation: # Mild aortic stenosis: Melvin Burns has severe mitral stenosis and moderate mitral regurgitation.   He is very physically active at baseline.  The mean gradient on his transthoracic echo in November was 8 mmHg, consistent with moderate mitral stenosis.  The gradient was not checked on his  stress test, however he did develop VT and chest pain.  Therefore I am considering this symptomatic mitral stenosis.  Interestingly, the gradient has been as high as 17 mmHg in the past.  I suspect that is truly severe.  We will refer him for TEE and left and right heart catheterization in preparation for meeting with Melvin Burns. I would consider him class D valvular heart disease and refer for balloon valvuloplasty vs replacement.  There was only trace MR on his last echo but it has been moderate  in the past.    Risks and benefits of cardiac catheterization have been discussed with the patient.  The patient understands that risks included but are not limited to stroke (1 in 1000), death (1 in 1000), kidney failure [usually temporary] (1 in 500), bleeding (1 in 200), allergic reaction [possibly serious] (1 in 200). The patient understands and agrees to proceed.   # Hypertension: Blood pressure is well-controlled on amlodipine, chlorthalidone, and lisinopril.  # Tobacco abuse:  Encouraged cessation.    Time spent: 20 minutes-Greater than 50% of this time was spent in counseling, explanation of diagnosis, planning of further management, and coordination of care.  Complex decision making.    Current medicines are reviewed at length with the patient today.  The patient does not have concerns regarding medicines.  The following changes have been made:    Labs/ tests ordered today include:   No orders of the defined types were placed in this encounter.    Disposition:   FU with Melvin Winch C. [place], Melvin Burns, FACC in 3 months.     Signed, Melvin Jeudy C. [place], Melvin Burns, FACC  12/10/2019 3:41 PM    [facility] 

## 2019-12-10 NOTE — Patient Instructions (Addendum)
Medication Instructions:  START IBUPROFEN 800 MG THREE TIMES A DAY FOR 1 WEEK ONLY  *If you need a refill on your cardiac medications before your next appointment, please call your pharmacy*  Lab Work: BMET/CBC TODAY  COVID SCREENING 12/12/2019 AT 3:15, [facility]   If you have labs (blood work) drawn today and your tests are completely normal, you will receive your results only by: Marland Kitchen MyChart Message (if you have MyChart) OR . A paper copy in the mail If you have any lab test that is abnormal or we need to change your treatment, we will call you to review the results.  Testing/Procedures: Your physician has requested that you have a cardiac catheterization. Cardiac catheterization is used to diagnose and/or treat various heart conditions. Doctors may recommend this procedure for a number of different reasons. The most common reason is to evaluate chest pain. Chest pain can be a symptom of coronary artery disease (CAD), and cardiac catheterization can show whether plaque is narrowing or blocking your heart's arteries. This procedure is also used to evaluate the valves, as well as measure the blood flow and oxygen levels in different parts of your heart. For further information please visit [email or web address] Please follow instruction sheet, as given  Your physician has requested that you have a TEE. During a TEE, sound waves are used to create images of your heart. It provides your doctor with information about the size and shape of your heart and how well your heart's chambers and valves are working. In this test, a transducer is attached to the end of a flexible tube that's guided down your throat and into your esophagus (the tube leading from you mouth to your stomach) to get a more detailed image of your heart. You are not awake for the procedure. Please see the instruction sheet given to you today. For further information please visit [email or web address]  YOU WILL HAVE YOUR TEE  FIRST FOLLOWED BY YOUR CATH   Follow-Up: At [facility], you and your health needs are our priority.  As part of our continuing mission to provide you with exceptional heart care, we have created designated Provider Care Teams.  These Care Teams include your primary Cardiologist (physician) and Advanced Practice Providers (APPs -  Physician Assistants and Nurse Practitioners) who all work together to provide you with the care you need, when you need it.  We recommend signing up for the patient portal called "MyChart".  Sign up information is provided on this After Visit Summary.  MyChart is used to connect with patients for Virtual Visits (Telemedicine).  Patients are able to view lab/test results, encounter notes, upcoming appointments, etc.  Non-urgent messages can be sent to your provider as well.   To learn more about what you can do with MyChart, go to [email or web address].    Your next appointment:   3 month(s)  The format for your next appointment:   In Person  Provider:   You may see Chilton Si, MD or one of the following Advanced Practice Providers on your designated Care Team:    Corine Shelter, PA-C  [place], [place]  Edd Fabian, [place]  Other Instructions  You are scheduled for a TEE 12/16/2019 with Dr. Jacques Navy.  Please arrive at the [facility] (Main Entrance A) at [facility]: [address] [place], [place] [address] at 10:00 am (1 hour prior to procedure unless lab work is needed; if lab work is needed arrive 1.5 hours ahead)  DIET: Nothing to  eat or drink after midnight except a sip of water with medications (see medication instructions below)  Medication Instructions: HOLD LISINOPRIL AND CHLORTHALIDONE MORNING OF PROCEDURE   Labs: TODAY   COVID SCREENING 12/12/2019 AT 3:15  You must have a responsible person to drive you home and stay in the waiting area during your procedure. Failure to do so could result in cancellation.  Bring your  insurance cards.  *Special Note: Every effort is made to have your procedure done on time. Occasionally there are emergencies that occur at the hospital that may cause delays. Please be patient if a delay does occur.     [facility] [place] [address] Dept: [phone] Loc: [phone]  Melvin Burns  12/10/2019  You are scheduled for a Cardiac Catheterization on Monday, March 8 with Dr. Harrell Gave End.  1. Please arrive at the [facility] (Main Entrance A) at [facility]: [place], [place] [address] WILL BE DONE FOLLOWING TEE . Free valet parking service is available.   Special note: Every effort is made to have your procedure done on time. Please understand that emergencies sometimes delay scheduled procedures.  2. Diet: Do not eat solid foods after midnight.  The patient may have clear liquids until 5am upon the day of the procedure.  3. Labs: TODAY    Contrast Allergy: No  5. Plan for one night stay--bring personal belongings. 6. Bring a current list of your medications and current insurance cards. 7. You MUST have a responsible person to drive you home. 8. Someone MUST be with you the first 24 hours after you arrive home or your discharge will be delayed. 9. Please wear clothes that are easy to get on and off and wear slip-on shoes.  Thank you for allowing [place] to care for you!   -- [facility]

## 2019-12-10 NOTE — H&P (View-Only) (Signed)
Cardiology Office Note   Date:  12/10/2019   ID:  Melvin Burns, DOB 1966-01-04, MRN [id number]  PCP:  Tresa Garter, MD  Cardiologist:   Skeet Latch, MD   No chief complaint on file.   History of Present Illness: Melvin Burns is a 54 y.o. male with Rheumatic mitral valve disease, NSVT, severe mitral stenosis and moderate mitral regurgitation, mild aortic stenosis, hypertension and tobacco abuse who presents for follow up.  Melvin Burns was first seen 07/05/16 due to an abnormal EKG.  He saw his PCP on 8/23 and was noted to have PACs as well as a PVC. He was also noted to have LVH with repolarization abnormality.  He was referred to cardiology for further evaluation.  At that appointment he was noted to have a murmur on exam.  He had an echo 08/08/16 that showed LVEF 60-65%.  He had severe mitral stenosis with a mean gradient of 17 mmHg and moderate mitral regurgitation.  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  His blood presure was elevated and lisinopril was added to his regimen.  He did well clinically and exercise at the gym 5 to 7 days/week without any chest pain or shortness of breath.  He was treated for pneumonia 07/2019.  Echo at that time revealed a mean mitral valve gradient of 8 mmHg.  After that he stopped going to the gym as regularly.  His last appointment he reported some increased shortness of breath.  He had no edema, orthopnea, or PND.  It was unclear whether his symptoms were due to progressive valvular disease or residual dyspnea from pneumonia.  He was referred for an exercise stress echo to see if his gradient increased with exercise.  Unfortunately, the gradient was not measured during the stress test.  It was negative for ischemia.  He did develop chest pain and short runs of VT.  Therefore it was felt that he has symptomatic mitral stenosis and we discussed referring him for TEE and cath in preparation for mitral valve replacement.  He presents  today for preprocedural assessment.  Since his last appointment Melvin Burns has been doing okay.  He has been working a lot so he is not he has not started back exercising.  He has been working 12-hour shifts and does not have energy when he gets home.  He denies any chest pain or shortness of breath lately.  This only occurs when he really exerts himself.  He has no lower extremity edema, orthopnea, or PND.  His only complaint today is pain in his left elbow.  He does not recall injuring it but it is tender to touch.   Past Medical History:  Diagnosis Date  . Hypertension   . Mild aortic stenosis 08/15/2017  . Mitral valve stenosis, severe 08/15/2017  . Moderate mitral regurgitation 08/15/2017  . 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  Moderate mitral regurgitation.    Past Surgical History:  Procedure Laterality Date  . BILATERAL KNEE ARTHROSCOPY       Current Outpatient Medications  Medication Sig Dispense Refill  . albuterol (VENTOLIN HFA) 108 (90 Base) MCG/ACT inhaler Inhale 2 puffs into the lungs every 4 (four) hours as needed for wheezing or shortness of breath. 6.7 g 0  . amLODipine (NORVASC) 10 MG tablet TAKE 1 TABLET(10 MG) BY MOUTH DAILY (Patient taking differently: Take 10 mg by mouth daily. ) 90  tablet 2  . chlorthalidone (HYGROTON) 25 MG tablet TAKE 1 TABLET(25 MG) BY MOUTH DAILY (Patient taking differently: Take 25 mg by mouth daily. ) 90 tablet 2  . ibuprofen (ADVIL) 200 MG tablet Take 400 mg by mouth every 6 (six) hours as needed for moderate pain.     Marland Kitchen lisinopril (ZESTRIL) 10 MG tablet TAKE 1 TABLET(10 MG) BY MOUTH DAILY (Patient taking differently: Take 10 mg by mouth daily. ) 90 tablet 1   No current facility-administered medications for this visit.    Allergies:   Patient has no known allergies.    Social History:  The patient  reports that he has been smoking cigarettes. He has  been smoking about 1.00 pack per day. He has never used smokeless tobacco. He reports that he does not drink alcohol or use drugs.   Family History:  The patient's family history includes Hypertension in his father and mother; Stroke in his father.    ROS:  Please see the history of present illness.   Otherwise, review of systems are positive for none.   All other systems are reviewed and negative.    PHYSICAL EXAM: VS:  BP 122/72   Pulse 85   Ht 5\' 9"  (1.753 m)   Wt 184 lb 3.2 oz (83.6 kg)   BMI 27.20 kg/m  , BMI Body mass index is 27.2 kg/m. GENERAL:  Well appearing.  No acute distress HEENT: Pupils equal round and reactive, fundi not visualized, oral mucosa unremarkable NECK:  No jugular venous distention, waveform within normal limits, carotid upstroke brisk and symmetric, no bruits LUNGS:  Clear to auscultation bilaterally HEART:  RRR.  PMI not displaced or sustained,S1 and S2 within normal limits, no S3, no S4, no clicks, no rubs, II/VI holosystolic murmur at the apex.  III/VI systolic murmur at the LUSB. ABD:  Flat, positive bowel sounds normal in frequency in pitch, no bruits, no rebound, no guarding, no midline pulsatile mass, no hepatomegaly, no splenomegaly EXT:  2 plus pulses throughout, no edema, no cyanosis no clubbing SKIN:  No rashes no nodules NEURO:  Cranial nerves II through XII grossly intact, motor grossly intact throughout PSYCH:  Cognitively intact, oriented to person place and time   EKG:  EKG is ordered today. The ekg ordered 12/15/16 demonstrates sinus rhythm.  Rate 81 bpm.  PACs.  LVH with repolariazation abnormality.  08/15/17: Sinus rhythm.  Rate 76 bpm.  LVH with secondary repolarization abnormality. 06/06/18: Sinus rhythm.  Rate 73 bpm.  LVH. 08/29/2019: Sinus rhythm.  PAC.  Rate 75 bpm.  Left atrial enlargement.  Inferior T wave flattening. 12/10/19: Sinus rhythm.  PACs.  Rate 85 bpm  LVH with repolarization abnormality  Echo 08/08/16: Study  Conclusions  - Left ventricle: The cavity size was normal. There was mild focal   basal hypertrophy of the septum. Systolic function was normal.   The estimated ejection fraction was in the range of 60% to 65%.   Wall motion was normal; there were no regional wall motion   abnormalities. Features are consistent with a pseudonormal left   ventricular filling pattern, with concomitant abnormal relaxation   and increased filling pressure (grade 2 diastolic dysfunction).   Doppler parameters are consistent with elevated ventricular   end-diastolic filling pressure. - Aortic valve: Transvalvular velocity was minimally increased.   There was mild stenosis. There was mild regurgitation. Peak   gradient (S): 25 mm Hg. - Aortic root: The aortic root was normal in size. - Mitral valve:  Severely thickened and mildly calcified mitral   valve that is rheumatic in apearance. There is limited leaflet   separation with severe mitral stenosis and moderate   regurgitation. The findings are consistent with severe stenosis.   There was moderate regurgitation. Mean gradient (D): 17 mm Hg.   Peak gradient (D): 40 mm Hg. - Left atrium: The atrium was severely dilated. - Right ventricle: Systolic function was normal. - Tricuspid valve: There was mild regurgitation. - Pulmonic valve: There was no regurgitation. - Pulmonary arteries: Systolic pressure was within the normal   range. - Inferior vena cava: The vessel was normal in size. - Pericardium, extracardiac: There was no pericardial effusion.  Impressions:  - Severely thickened and mildly calcified mitral valve that is   rheumatic in apearance. There is limited leaflet eparation with   severe mitral stenosis and moderate regurgitation.  Echo 08/14/19: IMPRESSIONS   1. Left ventricular ejection fraction, by visual estimation, is 60 to 65%. The left ventricle has normal function. There is mildly increased left ventricular hypertrophy.  2. Left  ventricular diastolic parameters are indeterminate.  3. Global right ventricle has normal systolic function.The right ventricular size is normal. No increase in right ventricular wall thickness.  4. Left atrial size was normal.  5. Right atrial size was normal.  6. Severe calcification of the mitral valve leaflet(s).  7. Severe thickening of the mitral valve leaflet(s).  8. Severely decreased mobility of the mitral valve leaflets.  9. The mitral valve is rheumatic. Trace mitral valve regurgitation. Moderate-severe mitral stenosis. 10. The tricuspid valve is normal in structure. Tricuspid valve regurgitation is trivial. 11. The aortic valve is tricuspid. Aortic valve regurgitation is not visualized. moderate sclerosis. 12. Small gradient across aortic valve without stenosis. 13. The pulmonic valve was grossly normal. Pulmonic valve regurgitation is trivial. 14. The inferior vena cava is normal in size with greater than 50% respiratory variability, suggesting right atrial pressure of 3 mmHg.   Recent Labs: 07/28/2019: ALT 14; B Natriuretic Peptide 221.3; BUN 13; Creatinine, Ser 0.96; Hemoglobin 12.2; Platelets 186; Potassium 3.7; Sodium 138    Lipid Panel    Component Value Date/Time   CHOL 157 06/01/2016 1756   TRIG 129 06/01/2016 1756   HDL 47 06/01/2016 1756   CHOLHDL 3.3 06/01/2016 1756   VLDL 26 06/01/2016 1756   LDLCALC 84 06/01/2016 1756      Wt Readings from Last 3 Encounters:  12/10/19 184 lb 3.2 oz (83.6 kg)  08/29/19 181 lb 9.6 oz (82.4 kg)  07/27/19 182 lb (82.6 kg)      ASSESSMENT AND PLAN:  # Rheumatic valve disease: # Severe mitral stenosis: # Moderate mitral regurgitation: # Mild aortic stenosis: Mr. Littles has severe mitral stenosis and moderate mitral regurgitation.   He is very physically active at baseline.  The mean gradient on his transthoracic echo in November was 8 mmHg, consistent with moderate mitral stenosis.  The gradient was not checked on his  stress test, however he did develop VT and chest pain.  Therefore I am considering this symptomatic mitral stenosis.  Interestingly, the gradient has been as high as 17 mmHg in the past.  I suspect that is truly severe.  We will refer him for TEE and left and right heart catheterization in preparation for meeting with Dr. [name]. I would consider him class D valvular heart disease and refer for balloon valvuloplasty vs replacement.  There was only trace MR on his last echo but it has been moderate  in the past.    Risks and benefits of cardiac catheterization have been discussed with the patient.  The patient understands that risks included but are not limited to stroke (1 in 1000), death (1 in 1000), kidney failure [usually temporary] (1 in 500), bleeding (1 in 200), allergic reaction [possibly serious] (1 in 200). The patient understands and agrees to proceed.   # Hypertension: Blood pressure is well-controlled on amlodipine, chlorthalidone, and lisinopril.  # Tobacco abuse:  Encouraged cessation.    Time spent: 20 minutes-Greater than 50% of this time was spent in counseling, explanation of diagnosis, planning of further management, and coordination of care.  Complex decision making.    Current medicines are reviewed at length with the patient today.  The patient does not have concerns regarding medicines.  The following changes have been made:    Labs/ tests ordered today include:   No orders of the defined types were placed in this encounter.    Disposition:   FU with Altovise Wahler C. Duke Salvia, MD, [facility] in 3 months.     Signed, Joellen Tullos C. Duke Salvia, MD, [facility]  12/10/2019 3:41 PM    [facility]

## 2019-12-11 LAB — CBC WITH DIFFERENTIAL/PLATELET
Basophils Absolute: 0.2 10*3/uL (ref 0.0–0.2)
Basos: 2 %
EOS (ABSOLUTE): 0.4 10*3/uL (ref 0.0–0.4)
Eos: 4 %
Hematocrit: 42.5 % (ref 37.5–51.0)
Hemoglobin: 14.1 g/dL (ref 13.0–17.7)
Immature Grans (Abs): 0 10*3/uL (ref 0.0–0.1)
Immature Granulocytes: 0 %
Lymphocytes Absolute: 3.1 10*3/uL (ref 0.7–3.1)
Lymphs: 28 %
MCH: 29 pg (ref 26.6–33.0)
MCHC: 33.2 g/dL (ref 31.5–35.7)
MCV: 87 fL (ref 79–97)
Monocytes Absolute: 1.2 10*3/uL — ABNORMAL HIGH (ref 0.1–0.9)
Monocytes: 10 %
Neutrophils Absolute: 6.4 10*3/uL (ref 1.4–7.0)
Neutrophils: 56 %
Platelets: 232 10*3/uL (ref 150–450)
RBC: 4.86 x10E6/uL (ref 4.14–5.80)
RDW: 11.7 % (ref 11.6–15.4)
WBC: 11.3 10*3/uL — ABNORMAL HIGH (ref 3.4–10.8)

## 2019-12-11 LAB — COMPREHENSIVE METABOLIC PANEL
ALT: 18 IU/L (ref 0–44)
AST: 26 IU/L (ref 0–40)
Albumin/Globulin Ratio: 1.3 (ref 1.2–2.2)
Albumin: 4.5 g/dL (ref 3.8–4.9)
Alkaline Phosphatase: 98 IU/L (ref 39–117)
BUN/Creatinine Ratio: 12 (ref 9–20)
BUN: 11 mg/dL (ref 6–24)
Bilirubin Total: <0.2 mg/dL (ref 0.0–1.2)
CO2: 25 mmol/L (ref 20–29)
Calcium: 10.3 mg/dL — ABNORMAL HIGH (ref 8.7–10.2)
Chloride: 97 mmol/L (ref 96–106)
Creatinine, Ser: 0.94 mg/dL (ref 0.76–1.27)
GFR calc Af Amer: 105 mL/min/{1.73_m2} (ref >59)
GFR calc non Af Amer: 91 mL/min/{1.73_m2} (ref >59)
Globulin, Total: 3.5 g/dL (ref 1.5–4.5)
Glucose: 100 mg/dL — ABNORMAL HIGH (ref 65–99)
Potassium: 3.6 mmol/L (ref 3.5–5.2)
Sodium: 136 mmol/L (ref 134–144)
Total Protein: 8 g/dL (ref 6.0–8.5)

## 2019-12-12 ENCOUNTER — Telehealth: Payer: Self-pay | Admitting: *Deleted

## 2019-12-12 ENCOUNTER — Other Ambulatory Visit (HOSPITAL_COMMUNITY)
Admission: RE | Admit: 2019-12-12 | Discharge: 2019-12-12 | Disposition: A | Discharge status: Home / Self Care | Payer: No Typology Code available for payment source | Source: Ambulatory Visit | Attending: Internal Medicine | Admitting: Internal Medicine

## 2019-12-12 DIAGNOSIS — Z20822 Contact with and (suspected) exposure to covid-19: Secondary | ICD-10-CM | POA: Diagnosis not present

## 2019-12-12 DIAGNOSIS — Z01812 Encounter for preprocedural laboratory examination: Secondary | ICD-10-CM | POA: Insufficient documentation

## 2019-12-12 NOTE — Telephone Encounter (Signed)
Pt contacted pre-catheterization scheduled at [facility] for: Monday December 16, 2019 12 Noon Verified arrival time and place: [facility] Main Entrance A [facility]) at: 10 AM/TEE 11 AM   No solid food after midnight prior to cath, clear liquids until 5 AM day of procedure. Contrast allergy: no  Hold: Chlorthalidone-AM of procedure  Except hold medications AM meds can be  taken pre-cath with sip of water including: ASA 81 mg   Confirmed patient has responsible adult to drive home post procedure and observe 24 hours after arriving home: yes  Currently, due to Covid-19 pandemic, only one person will be allowed with patient. Must be the same person for patient's entire stay and will be required to wear a mask. They will be asked to wait in the waiting room for the duration of the patient's stay.  Patients are required to wear a mask when they enter the hospital.      COVID-19 Pre-Screening Questions:  . In the past 7 to 10 days have you had a cough,  shortness of breath, headache, congestion, fever (100 or greater) body aches, chills, sore throat, or sudden loss of taste or sense of smell? no . Have you been around anyone with known Covid 19 in the past 7-10 days ? no . Have you been around anyone who is awaiting Covid 19 test results in the past 7 to 10 days? no . Have you been around anyone who has been exposed to Covid 19, or has mentioned symptoms of Covid 19 within the past 7 to 10 days? No  I reviewed procedure/mask/visitor instructions, COVID-19 screening questions with patient, he verbalized understanding, thanked me for call.

## 2019-12-13 ENCOUNTER — Telehealth: Payer: Self-pay | Admitting: *Deleted

## 2019-12-13 DIAGNOSIS — D72828 Other elevated white blood cell count: Secondary | ICD-10-CM

## 2019-12-13 LAB — SARS CORONAVIRUS 2 (TAT 6-24 HRS): SARS Coronavirus 2: NEGATIVE

## 2019-12-13 NOTE — Telephone Encounter (Signed)
-----   Message from Chilton Si, MD sent at 12/13/2019  1:43 PM EST ----- White blood cell count remains mildly elevated, which seems to be chronic.  This should be evaluated by hematology if he hasn't ever seen them.  Labs are otherwise stable.

## 2019-12-13 NOTE — Telephone Encounter (Signed)
Advised patient of lab results  Referral placed for hematology

## 2019-12-16 ENCOUNTER — Encounter (HOSPITAL_COMMUNITY)
Admission: RE | Disposition: A | Discharge status: Home / Self Care | Payer: Self-pay | Source: Home / Self Care | Attending: Internal Medicine

## 2019-12-16 ENCOUNTER — Ambulatory Visit (HOSPITAL_BASED_OUTPATIENT_CLINIC_OR_DEPARTMENT_OTHER)
Admission: RE | Admit: 2019-12-16 | Discharge: 2019-12-16 | Disposition: A | Discharge status: Home / Self Care | Payer: No Typology Code available for payment source | Source: Ambulatory Visit | Attending: Cardiovascular Disease | Admitting: Cardiovascular Disease

## 2019-12-16 ENCOUNTER — Telehealth: Payer: Self-pay | Admitting: Hematology and Oncology

## 2019-12-16 ENCOUNTER — Other Ambulatory Visit: Payer: Self-pay

## 2019-12-16 ENCOUNTER — Encounter (HOSPITAL_COMMUNITY): Payer: Self-pay | Admitting: Internal Medicine

## 2019-12-16 ENCOUNTER — Encounter: Payer: Self-pay | Admitting: Hematology and Oncology

## 2019-12-16 ENCOUNTER — Encounter (HOSPITAL_COMMUNITY)
Admission: RE | Disposition: A | Discharge status: Home / Self Care | Payer: No Typology Code available for payment source | Source: Home / Self Care | Attending: Internal Medicine

## 2019-12-16 ENCOUNTER — Ambulatory Visit (HOSPITAL_COMMUNITY)
Admission: RE | Admit: 2019-12-16 | Discharge: 2019-12-16 | Disposition: A | Discharge status: Home / Self Care | Payer: No Typology Code available for payment source | Attending: Internal Medicine | Admitting: Internal Medicine

## 2019-12-16 DIAGNOSIS — I050 Rheumatic mitral stenosis: Secondary | ICD-10-CM

## 2019-12-16 DIAGNOSIS — I2510 Atherosclerotic heart disease of native coronary artery without angina pectoris: Secondary | ICD-10-CM

## 2019-12-16 DIAGNOSIS — Z79899 Other long term (current) drug therapy: Secondary | ICD-10-CM | POA: Diagnosis not present

## 2019-12-16 DIAGNOSIS — I080 Rheumatic disorders of both mitral and aortic valves: Secondary | ICD-10-CM | POA: Insufficient documentation

## 2019-12-16 DIAGNOSIS — I10 Essential (primary) hypertension: Secondary | ICD-10-CM | POA: Insufficient documentation

## 2019-12-16 DIAGNOSIS — F17210 Nicotine dependence, cigarettes, uncomplicated: Secondary | ICD-10-CM | POA: Diagnosis not present

## 2019-12-16 DIAGNOSIS — Z8249 Family history of ischemic heart disease and other diseases of the circulatory system: Secondary | ICD-10-CM | POA: Diagnosis not present

## 2019-12-16 HISTORY — PX: BUBBLE STUDY: SHX6837

## 2019-12-16 HISTORY — PX: RIGHT/LEFT HEART CATH AND CORONARY ANGIOGRAPHY: CATH118266

## 2019-12-16 HISTORY — PX: TEE WITHOUT CARDIOVERSION: SHX5443

## 2019-12-16 LAB — POCT I-STAT EG7
Acid-Base Excess: 2 mmol/L (ref 0.0–2.0)
Bicarbonate: 28.6 mmol/L — ABNORMAL HIGH (ref 20.0–28.0)
Calcium, Ion: 1.29 mmol/L (ref 1.15–1.40)
HCT: 42 % (ref 39.0–52.0)
Hemoglobin: 14.3 g/dL (ref 13.0–17.0)
O2 Saturation: 71 %
Potassium: 3.3 mmol/L — ABNORMAL LOW (ref 3.5–5.1)
Sodium: 141 mmol/L (ref 135–145)
TCO2: 30 mmol/L (ref 22–32)
pCO2, Ven: 53.2 mmHg (ref 44.0–60.0)
pH, Ven: 7.339 (ref 7.250–7.430)
pO2, Ven: 40 mmHg (ref 32.0–45.0)

## 2019-12-16 LAB — POCT I-STAT 7, (LYTES, BLD GAS, ICA,H+H)
Acid-Base Excess: 1 mmol/L (ref 0.0–2.0)
Bicarbonate: 27.5 mmol/L (ref 20.0–28.0)
Calcium, Ion: 1.3 mmol/L (ref 1.15–1.40)
HCT: 42 % (ref 39.0–52.0)
Hemoglobin: 14.3 g/dL (ref 13.0–17.0)
O2 Saturation: 98 %
Potassium: 3.4 mmol/L — ABNORMAL LOW (ref 3.5–5.1)
Sodium: 141 mmol/L (ref 135–145)
TCO2: 29 mmol/L (ref 22–32)
pCO2 arterial: 49.7 mmHg — ABNORMAL HIGH (ref 32.0–48.0)
pH, Arterial: 7.351 (ref 7.350–7.450)
pO2, Arterial: 108 mmHg (ref 83.0–108.0)

## 2019-12-16 SURGERY — RIGHT/LEFT HEART CATH AND CORONARY ANGIOGRAPHY
Anesthesia: LOCAL

## 2019-12-16 SURGERY — ECHOCARDIOGRAM, TRANSESOPHAGEAL
Anesthesia: Moderate Sedation

## 2019-12-16 MED ORDER — LIDOCAINE HCL (PF) 1 % IJ SOLN
INTRAMUSCULAR | Status: AC
  Filled 2019-12-16: qty 30

## 2019-12-16 MED ORDER — SODIUM CHLORIDE 0.9 % IV SOLN: INTRAVENOUS | Status: DC

## 2019-12-16 MED ORDER — ASPIRIN EC 81 MG PO TBEC: 81.0000 mg | DELAYED_RELEASE_TABLET | Freq: Every day | ORAL | Status: AC

## 2019-12-16 MED ORDER — ASPIRIN 81 MG PO CHEW
81.0000 mg | CHEWABLE_TABLET | ORAL | Status: AC
  Administered 2019-12-16: 81 mg via ORAL

## 2019-12-16 MED ORDER — SODIUM CHLORIDE 0.9 % IV SOLN: 250.0000 mL | INTRAVENOUS | Status: DC | PRN

## 2019-12-16 MED ORDER — SODIUM CHLORIDE 0.9% FLUSH: 3.0000 mL | Freq: Two times a day (BID) | INTRAVENOUS | Status: DC

## 2019-12-16 MED ORDER — ONDANSETRON HCL 4 MG/2ML IJ SOLN: 4.0000 mg | Freq: Four times a day (QID) | INTRAMUSCULAR | Status: DC | PRN

## 2019-12-16 MED ORDER — MIDAZOLAM HCL (PF) 5 MG/ML IJ SOLN
INTRAMUSCULAR | Status: AC
  Filled 2019-12-16: qty 2

## 2019-12-16 MED ORDER — SODIUM CHLORIDE 0.9 % WEIGHT BASED INFUSION
3.0000 mL/kg/h | INTRAVENOUS | Status: AC
  Administered 2019-12-16: 3 mL/kg/h via INTRAVENOUS

## 2019-12-16 MED ORDER — HEPARIN (PORCINE) IN NACL 1000-0.9 UT/500ML-% IV SOLN
INTRAVENOUS | Status: AC
  Filled 2019-12-16: qty 1000

## 2019-12-16 MED ORDER — ASPIRIN 81 MG PO CHEW: 81.0000 mg | CHEWABLE_TABLET | ORAL | Status: DC

## 2019-12-16 MED ORDER — SODIUM CHLORIDE 0.9% FLUSH: 3.0000 mL | INTRAVENOUS | Status: DC | PRN

## 2019-12-16 MED ORDER — VERAPAMIL HCL 2.5 MG/ML IV SOLN
INTRAVENOUS | Status: AC
  Filled 2019-12-16: qty 2

## 2019-12-16 MED ORDER — MIDAZOLAM HCL 2 MG/2ML IJ SOLN
INTRAMUSCULAR | Status: AC
  Filled 2019-12-16: qty 2

## 2019-12-16 MED ORDER — FENTANYL CITRATE (PF) 100 MCG/2ML IJ SOLN
INTRAMUSCULAR | Status: AC
  Filled 2019-12-16: qty 2

## 2019-12-16 MED ORDER — IOHEXOL 350 MG/ML SOLN
INTRAVENOUS | Status: AC
  Filled 2019-12-16: qty 1

## 2019-12-16 MED ORDER — FENTANYL CITRATE (PF) 100 MCG/2ML IJ SOLN
INTRAMUSCULAR | Status: DC | PRN
  Administered 2019-12-16: 25 ug via INTRAVENOUS

## 2019-12-16 MED ORDER — HEPARIN (PORCINE) IN NACL 1000-0.9 UT/500ML-% IV SOLN
INTRAVENOUS | Status: DC | PRN
  Administered 2019-12-16 (×2): 500 mL

## 2019-12-16 MED ORDER — FENTANYL CITRATE (PF) 100 MCG/2ML IJ SOLN
INTRAMUSCULAR | Status: AC
  Filled 2019-12-16: qty 4

## 2019-12-16 MED ORDER — MIDAZOLAM HCL (PF) 10 MG/2ML IJ SOLN
INTRAMUSCULAR | Status: DC | PRN
  Administered 2019-12-16: 2 mg via INTRAVENOUS
  Administered 2019-12-16 (×2): 1 mg via INTRAVENOUS
  Administered 2019-12-16: 2 mg via INTRAVENOUS

## 2019-12-16 MED ORDER — FENTANYL CITRATE (PF) 100 MCG/2ML IJ SOLN
INTRAMUSCULAR | Status: DC | PRN
  Administered 2019-12-16 (×2): 50 ug via INTRAVENOUS

## 2019-12-16 MED ORDER — HEPARIN SODIUM (PORCINE) 1000 UNIT/ML IJ SOLN
INTRAMUSCULAR | Status: DC | PRN
  Administered 2019-12-16: 4000 [IU] via INTRAVENOUS

## 2019-12-16 MED ORDER — HYDRALAZINE HCL 20 MG/ML IJ SOLN: 10.0000 mg | INTRAMUSCULAR | Status: DC | PRN

## 2019-12-16 MED ORDER — SODIUM CHLORIDE 0.9 % WEIGHT BASED INFUSION: 1.0000 mL/kg/h | INTRAVENOUS | Status: DC

## 2019-12-16 MED ORDER — VERAPAMIL HCL 2.5 MG/ML IV SOLN
INTRAVENOUS | Status: DC | PRN
  Administered 2019-12-16: 10 mL via INTRA_ARTERIAL

## 2019-12-16 MED ORDER — LABETALOL HCL 5 MG/ML IV SOLN: 10.0000 mg | INTRAVENOUS | Status: DC | PRN

## 2019-12-16 MED ORDER — BUTAMBEN-TETRACAINE-BENZOCAINE 2-2-14 % EX AERO
INHALATION_SPRAY | CUTANEOUS | Status: DC | PRN
  Administered 2019-12-16: 2 via TOPICAL

## 2019-12-16 MED ORDER — IOHEXOL 350 MG/ML SOLN
INTRAVENOUS | Status: DC | PRN
  Administered 2019-12-16: 35 mL via INTRA_ARTERIAL

## 2019-12-16 MED ORDER — LIDOCAINE HCL (PF) 1 % IJ SOLN
INTRAMUSCULAR | Status: DC | PRN
  Administered 2019-12-16 (×2): 2 mL

## 2019-12-16 MED ORDER — ACETAMINOPHEN 325 MG PO TABS: 650.0000 mg | ORAL_TABLET | ORAL | Status: DC | PRN

## 2019-12-16 MED ORDER — HEPARIN SODIUM (PORCINE) 1000 UNIT/ML IJ SOLN
INTRAMUSCULAR | Status: AC
  Filled 2019-12-16: qty 1

## 2019-12-16 MED ORDER — MIDAZOLAM HCL 2 MG/2ML IJ SOLN
INTRAMUSCULAR | Status: DC | PRN
  Administered 2019-12-16: 1 mg via INTRAVENOUS

## 2019-12-16 MED ORDER — ASPIRIN 81 MG PO CHEW
CHEWABLE_TABLET | ORAL | Status: AC
  Filled 2019-12-16: qty 1

## 2019-12-16 SURGICAL SUPPLY — 13 items
CATH 5FR JL3.5 JR4 ANG PIG MP (CATHETERS) ×2 IMPLANT
CATH BALLN WEDGE 5F 110CM (CATHETERS) ×2 IMPLANT
DEVICE RAD COMP TR BAND LRG (VASCULAR PRODUCTS) ×2 IMPLANT
GLIDESHEATH SLEND SS 6F .021 (SHEATH) ×2 IMPLANT
GUIDEWIRE INQWIRE 1.5J.035X260 (WIRE) ×1 IMPLANT
INQWIRE 1.5J .035X260CM (WIRE) ×2
KIT HEART LEFT (KITS) ×2 IMPLANT
PACK CARDIAC CATHETERIZATION (CUSTOM PROCEDURE TRAY) ×2 IMPLANT
SHEATH GLIDE SLENDER 4/5FR (SHEATH) ×2 IMPLANT
TRANSDUCER W/STOPCOCK (MISCELLANEOUS) ×4 IMPLANT
TUBING ART PRESS 72  MALE/FEM (TUBING) ×2
TUBING ART PRESS 72 MALE/FEM (TUBING) ×1 IMPLANT
TUBING CIL FLEX 10 FLL-RA (TUBING) ×2 IMPLANT

## 2019-12-16 NOTE — CV Procedure (Addendum)
INDICATIONS: rheumatic heart disease  PROCEDURE:   Informed consent was obtained prior to the procedure. The risks, benefits and alternatives for the procedure were discussed and the patient comprehended these risks.  Risks include, but are not limited to, cough, sore throat, vomiting, nausea, somnolence, esophageal and stomach trauma or perforation, bleeding, low blood pressure, aspiration, pneumonia, infection, trauma to the teeth and death.    After a procedural time-out, the oropharynx was anesthetized with 20% benzocaine spray.   During this procedure the patient was administered a total of Versed 6 mg and Fentanyl 100 mg to achieve and maintain moderate conscious sedation.  The patient's heart rate, blood pressure, and oxygen saturationweare monitored continuously during the procedure. The period of conscious sedation was 45 minutes, of which I was present face-to-face 100% of this time.  The transesophageal probe was inserted in the esophagus and stomach without difficulty and multiple views were obtained.  The patient was kept under observation until the patient left the procedure room.  The patient left the procedure room in stable condition.   Agitated microbubble saline contrast was administered.  COMPLICATIONS:    There were no immediate complications.  FINDINGS:   Severe mitral valve stenosis - full quantitation pending, see TEE report.  Trivial AI Trivial MR LVEF 60-65% No PFO.    RECOMMENDATIONS:    Ok to dismiss when alert  Time Spent Directly with the Patient:  60 minutes   [name] 12/16/2019, 12:21 PM

## 2019-12-16 NOTE — Interval H&P Note (Signed)
History and Physical Interval Note:  12/16/2019 11:24 AM  Melvin Burns  has presented today for surgery, with the diagnosis of MITRAL VAVLE DISEASE.  The various methods of treatment have been discussed with the patient and family. After consideration of risks, benefits and other options for treatment, the patient has consented to  Procedure(s): TRANSESOPHAGEAL ECHOCARDIOGRAM (TEE) (N/A) as a surgical intervention.  The patient's history has been reviewed, patient examined, no change in status, stable for surgery.  I have reviewed the patient's chart and labs.  Questions were answered to the patient's satisfaction.     [name]

## 2019-12-16 NOTE — Interval H&P Note (Signed)
History and Physical Interval Note:  12/16/2019 1:35 PM  Melvin Burns  has presented today for surgery, with the diagnosis of mitral valve disease.  The various methods of treatment have been discussed with the patient and family. After consideration of risks, benefits and other options for treatment, the patient has consented to  Procedure(s): RIGHT/LEFT HEART CATH AND CORONARY ANGIOGRAPHY (N/A) as a surgical intervention.  The patient's history has been reviewed, patient examined, no change in status, stable for surgery.  I have reviewed the patient's chart and labs.  Questions were answered to the patient's satisfaction.    Cath Lab Visit (complete for each Cath Lab visit)  Clinical Evaluation Leading to the Procedure:   ACS: No.  Non-ACS:    Anginal/Heart Failure Classification: NYHA class III  Anti-ischemic medical therapy: No Therapy  Non-Invasive Test Results: No non-invasive testing performed  Prior CABG: No previous CABG  Ardyce Heyer

## 2019-12-16 NOTE — Discharge Instructions (Signed)
Drink plenty of water next 48 hours and keep wrist elevated at heart level for 24 hours  Radial Site Care  This sheet gives you information about how to care for yourself after your procedure. Your health care provider may also give you more specific instructions. If you have problems or questions, contact your health care provider. What can I expect after the procedure? After the procedure, it is common to have:  Bruising and tenderness at the catheter insertion area. Follow these instructions at home: Medicines  Take over-the-counter and prescription medicines only as told by your health care provider. Insertion site care  Follow instructions from your health care provider about how to take care of your insertion site. Make sure you: ? Wash your hands with soap and water before you change your bandage (dressing). If soap and water are not available, use hand sanitizer. ? Remove your dressing as told by your health care provider. In 24 hours  Check your insertion site every day for signs of infection. Check for: ? Redness, swelling, or pain. ? Fluid or blood. ? Pus or a bad smell. ? Warmth.  Do not take baths, swim, or use a hot tub until your health care provider approves.  You may shower 24-48 hours after the procedure, or as directed by your health care provider. ? Remove the dressing and gently wash the site with plain soap and water. ? Pat the area dry with a clean towel. ? Do not rub the site. That could cause bleeding.  Do not apply powder or lotion to the site. Activity   For 24 hours after the procedure, or as directed by your health care provider: ? Do not flex or bend the affected arm. ? Do not push or pull heavy objects with the affected arm. ? Do not drive yourself home from the hospital or clinic. You may drive 24 hours after the procedure unless your health care provider tells you not to. ? Do not operate machinery or power tools.  Do not lift anything that  is heavier than 10 lb (4.5 kg), or the limit that you are told, until your health care provider says that it is safe. For 4 days  Ask your health care provider when it is okay to: ? Return to work or school. ? Resume usual physical activities or sports. ? Resume sexual activity. General instructions  If the catheter site starts to bleed, raise your arm and put firm pressure on the site. Call 911.This is a medical emergency.  If you went home on the same day as your procedure, a responsible adult should be with you for the first 24 hours after you arrive home.  Keep all follow-up visits as told by your health care provider. This is important. Contact a health care provider if:  You have a fever.  You have redness, swelling, or yellow drainage around your insertion site. Get help right away if:  You have unusual pain at the radial site.  The catheter insertion area swells very fast.  The insertion area is bleeding, and the bleeding does not stop when you hold steady pressure on the area.  Your arm or hand becomes pale, cool, tingly, or numb. These symptoms may represent a serious problem that is an emergency. Do not wait to see if the symptoms will go away. Get medical help right away. Call your local emergency services (911 in the [place] Do not drive yourself to the hospital. Summary  After the procedure, it  is common to have bruising and tenderness at the site.  Follow instructions from your health care provider about how to take care of your radial site wound. Check the wound every day for signs of infection.  Do not lift anything that is heavier than 10 lb (4.5 kg), or the limit that you are told, until your health care provider says that it is safe. This information is not intended to replace advice given to you by your health care provider. Make sure you discuss any questions you have with your health care provider. Document Revised: 11/01/2017 Document Reviewed:  11/01/2017 [employer]vier Patient Education  2020 [employer].

## 2019-12-16 NOTE — Telephone Encounter (Signed)
Received a new hem referral from Dr. Duke Salvia for elevated wbc. Pt has been scheduled to see Dr. Leonides Schanz on 3/25 at 2pm w/labs at 3:15pm. Pt is currently in the hospital. Letter mailed and referring office notified.

## 2019-12-16 NOTE — Telephone Encounter (Signed)
Duplicate encounter

## 2019-12-23 ENCOUNTER — Encounter: Payer: Self-pay | Admitting: Thoracic Surgery (Cardiothoracic Vascular Surgery)

## 2019-12-23 ENCOUNTER — Other Ambulatory Visit (HOSPITAL_COMMUNITY): Payer: No Typology Code available for payment source

## 2019-12-23 ENCOUNTER — Other Ambulatory Visit: Payer: Self-pay

## 2019-12-23 ENCOUNTER — Other Ambulatory Visit: Payer: Self-pay | Admitting: *Deleted

## 2019-12-23 ENCOUNTER — Institutional Professional Consult (permissible substitution) (INDEPENDENT_AMBULATORY_CARE_PROVIDER_SITE_OTHER): Payer: No Typology Code available for payment source | Admitting: Thoracic Surgery (Cardiothoracic Vascular Surgery)

## 2019-12-23 VITALS — BP 134/88 | HR 80 | Temp 97.7°F | Resp 20 | Ht 69.0 in | Wt 180.8 lb

## 2019-12-23 DIAGNOSIS — I052 Rheumatic mitral stenosis with insufficiency: Secondary | ICD-10-CM

## 2019-12-23 DIAGNOSIS — I71019 Dissection of thoracic aorta, unspecified: Secondary | ICD-10-CM

## 2019-12-23 DIAGNOSIS — I050 Rheumatic mitral stenosis: Secondary | ICD-10-CM

## 2019-12-23 DIAGNOSIS — I7101 Dissection of thoracic aorta: Secondary | ICD-10-CM

## 2019-12-23 DIAGNOSIS — I340 Nonrheumatic mitral (valve) insufficiency: Secondary | ICD-10-CM | POA: Diagnosis not present

## 2019-12-23 NOTE — Patient Instructions (Addendum)
Stop smoking immediately and permanently.  Continue taking all current medications without change

## 2019-12-23 NOTE — Progress Notes (Signed)
[address]       Melvin [place] [address]             [phone]     CARDIOTHORACIC SURGERY CONSULTATION REPORT  Referring Provider is Melvin Si, MD PCP is Melvin Angst, MD  Chief Complaint  Patient presents with  . Mitral Regurgitation    Surgical consultation  . Mitral Stenosis    HPI:  Patient is a 54 year old male originally from [place] in [place] with history of rheumatic mitral valve disease and hypertension who has been referred for surgical consultation to discuss treatment options for management of severe symptomatic mitral stenosis with mitral regurgitation.  Patient states that he does not have any documented history of rheumatic fever although he recalls being very ill with bronchial infection during his teenage years.  Several years ago he was noted to have an abnormal EKG and he was referred for cardiology consultation in 2017.  He was noted to have a heart murmur on physical exam and echocardiogram at that time revealed likely rheumatic heart disease with severe mitral stenosis, moderate mitral regurgitation, and normal left ventricular systolic function.  The patient denied any symptoms at that time and remained quite active physically.  In October 2020 he was treated for pneumonia and since then he has noted the development of exertional shortness of breath.  He was referred for stress echocardiogram although transvalvular gradient was not measured during stress during the procedure.  The patient did develop chest discomfort and short runs of ventricular tachycardia during stress.  He was seen in follow-up recently by Dr. Duke Burns who felt that further diagnostic work-up to consider elective mitral valve replacement was warranted.  TEE and diagnostic cardiac catheterization were performed December 16, 2019.  TEE revealed normal left ventricular function with ejection fraction estimated 60 to 65%.  There was severe rheumatic mitral valve  stenosis with commissural fusion, diastolic doming, moderate leaflet calcification, and mild calcification of the subvalvular apparatus.  By planimetry of the mitral valve area measured just under 1.0 cm.  There was mild mitral regurgitation.  The aortic valve appeared normal.  Right ventricular size and function was normal.  There was no significant tricuspid regurgitation.  Diagnostic cardiac catheterization confirmed the presence of severe mitral stenosis with mean transvalvular gradient measured 10 mmHg corresponding to aortic valve area calculated 1.3 cm.  There was moderately elevated pulmonary capillary wedge pressure and mild to moderately elevated right heart pressures with normal cardiac output.  There was two-vessel coronary artery disease with 70% stenosis involving ostial portion of the third obtuse marginal branch with otherwise only mild nonobstructive disease in the remaining coronary circulation.  Cardiothoracic surgical consultation was requested.  Patient is married and lives locally in [place] with his wife.  He moved from [place] to [place] in 1998.  He works full-time as a Estate agent.  He has remained physically active all of his life and up until recently has enjoyed exercise on a regular basis.  He states that over the past 6 months he has developed exertional shortness of breath that only occurs with more strenuous exertion, such as going up a flight of stairs.  He denies resting shortness of breath, PND, orthopnea, or lower extremity edema.  He does report some mild vague discomfort across his chest which seems to occur primarily when he is laying flat.  He reports occasional palpitations without dizzy spells or syncope.  Past Medical History:  Diagnosis Date  .  Hypertension   . Mild aortic stenosis   . Mitral valve stenosis, severe   . Moderate mitral regurgitation   . Rheumatic mitral stenosis with regurgitation    Echo 08/08/16:  Severe mitral  stenosis (mean gradient 17 mmHg).  Moderate mitral regurgitation.    Past Surgical History:  Procedure Laterality Date  . BILATERAL KNEE ARTHROSCOPY    . BUBBLE STUDY  12/16/2019   Procedure: BUBBLE STUDY;  Surgeon: Melvin Poisson, MD;  Location: [facility] ENDOSCOPY;  Service: Cardiology;;  . RIGHT/LEFT HEART CATH AND CORONARY ANGIOGRAPHY N/A 12/16/2019   Procedure: RIGHT/LEFT HEART CATH AND CORONARY ANGIOGRAPHY;  Surgeon: Melvin Kendall, MD;  Location: [facility] INVASIVE CV LAB;  Service: Cardiovascular;  Laterality: N/A;  . TEE WITHOUT CARDIOVERSION N/A 12/16/2019   Procedure: TRANSESOPHAGEAL ECHOCARDIOGRAM (TEE);  Surgeon: Melvin Poisson, MD;  Location: [facility] ENDOSCOPY;  Service: Cardiology;  Laterality: N/A;    Family History  Problem Relation Age of Onset  . Hypertension Mother   . Hypertension Father   . Stroke Father     Social History   Socioeconomic History  . Marital status: Married    Spouse name: Not on file  . Number of children: Not on file  . Years of education: Not on file  . Highest education level: Not on file  Occupational History  . Occupation: [employer] account executive  Tobacco Use  . Smoking status: Current Every Day Smoker    Packs/day: 1.00    Types: Cigarettes  . Smokeless tobacco: Never Used  Substance and Sexual Activity  . Alcohol use: No  . Drug use: No  . Sexual activity: Yes    Partners: Female  Other Topics Concern  . Not on file  Social History Narrative   Pt is married.  He is a former Database administrator.             Social Determinants of Health   Financial Resource Strain:   . Difficulty of Paying Living Expenses:   Food Insecurity:   . Worried About Programme researcher, broadcasting/film/video in the Last Year:   . Barista in the Last Year:   Transportation Needs:   . Freight forwarder (Medical):   Marland Kitchen Lack of Transportation (Non-Medical):   Physical Activity:   . Days of Exercise per Week:   . Minutes of Exercise per Session:   Stress:   . Feeling of  Stress :   Social Connections:   . Frequency of Communication with Friends and Family:   . Frequency of Social Gatherings with Friends and Family:   . Attends Religious Services:   . Active Member of Clubs or Organizations:   . Attends Banker Meetings:   Marland Kitchen Marital Status:   Intimate Partner Violence:   . Fear of Current or Ex-Partner:   . Emotionally Abused:   Marland Kitchen Physically Abused:   . Sexually Abused:     Current Outpatient Medications  Medication Sig Dispense Refill  . amLODipine (NORVASC) 10 MG tablet TAKE 1 TABLET(10 MG) BY MOUTH DAILY (Patient taking differently: Take 10 mg by mouth daily. ) 90 tablet 2  . aspirin EC 81 MG tablet Take 1 tablet (81 mg total) by mouth daily.    . chlorthalidone (HYGROTON) 25 MG tablet TAKE 1 TABLET(25 MG) BY MOUTH DAILY (Patient taking differently: Take 25 mg by mouth daily. ) 90 tablet 2  . ibuprofen (ADVIL) 200 MG tablet Take 400 mg by mouth every 6 (six) hours as needed for  moderate pain.     Marland Kitchen lisinopril (ZESTRIL) 10 MG tablet TAKE 1 TABLET(10 MG) BY MOUTH DAILY (Patient taking differently: Take 10 mg by mouth daily. ) 90 tablet 1   No current facility-administered medications for this visit.    No Known Allergies    Review of Systems:   General:  normal appetite, normal energy, no weight gain, no weight loss, no fever  Cardiac:  no chest pain with exertion, occasional chest pain at rest, +SOB with exertion, NO resting SOB, NO PND, NO orthopnea, + palpitations, no arrhythmia, no atrial fibrillation, no LE edema, no dizzy spells, no syncope  Respiratory:  no shortness of breath, no home oxygen, no productive cough, no dry cough, no bronchitis, no wheezing, no hemoptysis, no asthma, no pain with inspiration or cough, no sleep apnea, no CPAP at night  GI:   no difficulty swallowing, no reflux, no frequent heartburn, no hiatal hernia, no abdominal pain, no constipation, no diarrhea, no hematochezia, no hematemesis, no  melena  GU:   no dysuria,  no frequency, no urinary tract infection, no hematuria, no enlarged prostate, no kidney stones, no kidney disease  Vascular:  no pain suggestive of claudication, no pain in feet, no leg cramps, no varicose veins, no DVT, no non-healing foot ulcer  Neuro:   no stroke, no TIA's, no seizures, no headaches, no temporary blindness one eye,  no slurred speech, no peripheral neuropathy, no chronic pain, no instability of gait, no memory/cognitive dysfunction  Musculoskeletal: no arthritis, no joint swelling, no myalgias, no difficulty walking, normal mobility   Skin:   no rash, no itching, no skin infections, no pressure sores or ulcerations  Psych:   no anxiety, no depression, no nervousness, no unusual recent stress  Eyes:   no blurry vision, no floaters, no recent vision changes, + wears glasses or contacts  ENT:   no hearing loss, + loose or painful teeth, no dentures, last saw dentist last fall who told him his teeth needed to be removed  Hematologic:  no easy bruising, no abnormal bleeding, no clotting disorder, no frequent epistaxis  Endocrine:  no diabetes, does not check CBG's at home     Physical Exam:   BP 134/88 (BP Location: Right Arm, Patient Position: Sitting, Cuff Size: Large)   Pulse 80   Temp 97.7 F (36.5 C) (Temporal)   Resp 20   Ht 5\' 9"  (1.753 m)   Wt 180 lb 12.8 oz (82 kg)   SpO2 93% Comment: RA  BMI 26.70 kg/m   General:  Thin,  well-appearing  HEENT:  Unremarkable   Neck:   no JVD, no bruits, no adenopathy   Chest:   clear to auscultation, symmetrical breath sounds, no wheezes, no rhonchi   CV:   RRR, grade II/VI systolic murmur   Abdomen:  soft, non-tender, no masses   Extremities:  warm, well-perfused, pulses palpable, no LE edema  Rectal/GU  Deferred  Neuro:   Grossly non-focal and symmetrical throughout  Skin:   Clean and dry, no rashes, no breakdown   Diagnostic Tests:  ECHOCARDIOGRAM REPORT       Patient Name:   Date of Exam: 08/14/2019  Medical Rec #: [id number]      Height:    69.0 in  Accession #:  [id number]     Weight:    182.0 lb  Date of Birth: 1966/02/11     BSA:     1.98 m  Patient Age:  23  years      BP:      115/76 mmHg  Patient Gender: M          HR:      83 bpm.  Exam Location: [address]   Procedure: 2D Echo, Cardiac Doppler and Color Doppler   Indications:  I05.0 Mitral valve stenosis; Z01.818 Pre-operative  clearance;         I10 Hypertension;    History:    Patient has prior history of Echocardiogram examinations,  most         recent 08/10/2018. Mild aortic stenosis.    Sonographer:  Cathie Beams RCS  Referring Phys: [id number] [address] K KILROY   IMPRESSIONS    1. Left ventricular ejection fraction, by visual estimation, is 60 to  65%. The left ventricle has normal function. There is mildly increased  left ventricular hypertrophy.  2. Left ventricular diastolic parameters are indeterminate.  3. Global right ventricle has normal systolic function.The right  ventricular size is normal. No increase in right ventricular wall  thickness.  4. Left atrial size was normal.  5. Right atrial size was normal.  6. Severe calcification of the mitral valve leaflet(s).  7. Severe thickening of the mitral valve leaflet(s).  8. Severely decreased mobility of the mitral valve leaflets.  9. The mitral valve is rheumatic. Trace mitral valve regurgitation.  Moderate-severe mitral stenosis.  10. The tricuspid valve is normal in structure. Tricuspid valve  regurgitation is trivial.  11. The aortic valve is tricuspid. Aortic valve regurgitation is not  visualized. moderate sclerosis.  12. Small gradient across aortic valve without stenosis.  13. The pulmonic valve was grossly normal. Pulmonic valve regurgitation is  trivial.  14. The inferior vena cava is normal in size with  greater than 50%  respiratory variability, suggesting right atrial pressure of 3 mmHg.   FINDINGS  Left Ventricle: Left ventricular ejection fraction, by visual estimation,  is 60 to 65%. The left ventricle has normal function. There is mildly  increased left ventricular hypertrophy. Left ventricular diastolic  parameters are indeterminate. Normal left  atrial pressure.   Right Ventricle: The right ventricular size is normal. No increase in  right ventricular wall thickness. Global RV systolic function is has  normal systolic function.   Left Atrium: Left atrial size was normal in size.   Right Atrium: Right atrial size was normal in size   Pericardium: There is no evidence of pericardial effusion.   Mitral Valve: The mitral valve is rheumatic. There is severe thickening of  the mitral valve leaflet(s). There is severe calcification of the mitral  valve leaflet(s). Severely decreased mobility of the mitral valve  leaflets. Moderate-severe mitral valve  stenosis by observation. MV peak gradient, 17.0 mmHg. Trace mitral valve  regurgitation.   Tricuspid Valve: The tricuspid valve is normal in structure. Tricuspid  valve regurgitation is trivial.   Aortic Valve: The aortic valve is tricuspid. Aortic valve regurgitation is  not visualized. Moderate sclerosis. Aortic valve mean gradient measures  8.0 mmHg. Aortic valve peak gradient measures 16.2 mmHg. Aortic valve  area, by VTI measures 2.26 cm. Small  gradient across aortic valve without stenosis.   Pulmonic Valve: The pulmonic valve was grossly normal. Pulmonic valve  regurgitation is trivial.   Aorta: The aortic root, ascending aorta and aortic arch are all  structurally normal, with no evidence of dilitation or obstruction.   Venous: The inferior vena cava is normal in size with greater than 50%  respiratory variability, suggesting  right atrial pressure of 3 mmHg.   IAS/Shunts: No atrial level shunt detected by color  flow Doppler. No  ventricular septal defect is seen or detected. There is no evidence of an  atrial septal defect.      LEFT VENTRICLE  PLAX 2D  LVIDd:     3.92 cm  LVIDs:     2.29 cm  LV PW:     1.18 cm  LV IVS:    1.26 cm  LVOT diam:   2.05 cm  LV SV:     49 ml  LV SV Index:  24.16  LVOT Area:   3.30 cm     RIGHT VENTRICLE  RV Basal diam: 2.99 cm  RV S prime:   11.77 cm/s  TAPSE (M-mode): 1.8 cm   LEFT ATRIUM       Index    RIGHT ATRIUM      Index  LA diam:    5.50 cm 2.77 cm/m RA Area:   12.40 cm  LA Vol (A2C):  95.3 ml 48.01 ml/m RA Volume:  27.00 ml 13.60 ml/m  LA Vol (A4C):  88.1 ml 44.39 ml/m  LA Biplane Vol: 92.8 ml 46.75 ml/m  AORTIC VALVE  AV Area (Vmax):  2.12 cm  AV Area (Vmean):  2.09 cm  AV Area (VTI):   2.26 cm  AV Vmax:      201.00 cm/s  AV Vmean:     126.000 cm/s  AV VTI:      0.334 m  AV Peak Grad:   16.2 mmHg  AV Mean Grad:   8.0 mmHg  LVOT Vmax:     129.00 cm/s  LVOT Vmean:    79.800 cm/s  LVOT VTI:     0.229 m  LVOT/AV VTI ratio: 0.69    AORTA  Ao Root diam: 2.90 cm   MITRAL VALVE  MV Area (PHT): 1.40 cm       SHUNTS  MV Peak grad: 17.0 mmHg       Systemic VTI: 0.23 m  MV Mean grad: 8.0 mmHg       Systemic Diam: 2.05 cm  MV Vmax:    2.06 m/s  MV Vmean:   133.0 cm/s  MV VTI:    0.72 m  MV PHT:    157.18 msec  MV Decel Time: 542 msec  MV E velocity: 148.00 cm/s 103 cm/s  MV A velocity: 197.00 cm/s 70.3 cm/s  MV E/A ratio: 0.75    1.5     Charlton Haws MD  Electronically signed by Charlton Haws MD  Signature Date/Time: 08/14/2019/4:59:31 PM       EXERCISE STRESS REPORT       Patient Name:  Leary Roca Date of Exam: 11/25/2019  Medical Rec #: [id number]      Height:    69.0 in  Accession #:  [id number]     Weight:    181.6 lb  Date of Birth:  [birth year]     BSA:     1.98 m  Patient Age:  53 years      BP:      151/90 mmHg  Patient Gender: M          HR:      88 bpm.  Exam Location: [address]     Procedure: Stress Echo   Indications:  R06.00 Dyspnea    History:    Patient has prior history of Echocardiogram examinations,  most  recent 08/14/2019. Mitral valve stenosis. Mitral  regurgitation.         Rheumatic mitral valve. Mild aortic stenosis. LVH.    Sonographer:  Cathie Beams RCS  Referring Phys: [id number] [facility]    Transthoracic Stress Echo for Dyspnea evaluation.   Stress Protocol: The patient exercised on a treadmill for 10 minutes  minutes and 30 seconds to stage IV of a Bruce protocol, achieving 11 METS.  The patient exercised on a treadmill according to a Bruce protocol.   Protocol Termination:    Resting HR:   88      bpm Resting BP: 151/90  Target HR:   165     bpm Peak BP:   211/93  Peak HR:    173     bpm BP Response: hypertensive  % of Target HR: 103     bpm  HR Achieved:  was achieved   Patient Tolerance: The patient developed Chest tightness during the stress  exam. The symptoms resolved with rest.   EKG: The patient developed PVCs, Multifocal V cuplets, Short run Vtach,  PACs during exercise.   LV Pre-Stress Systolic Function: Pre-stress testing the left ventricular  systolic function was normal systolic function with a 60-65% ejection  fraction.   LV Post-Stress Systolic Function: Post-stress testing, the left  ventricular systolic function was hyperdynamic systolic function with a  >65% ejection fraction.         HR SysBP DiasBP  Symptoms  Stage I  123 189  79  Stage II 150 212  91  Chest tightness  Stage III 164 211  93  Stage IV 173   RECOVERY      Minutes      HR SysBP DiasBP Symptoms  Immediately following Stress 153 180   80  PACs  2 minutes          129 133  75  4 minutes          116  6 minutes          111 107  63  8 minutes          106  10 minutes            98   63   IMPRESSIONS    1. Left ventricular ejection fraction, by estimation, is 60 to 65%. The  left ventricle has normal function. The left ventricle has no regional  wall motion abnormalities. There is mild left ventricular hypertrophy.  Left ventricular diastolic function  could not be evaluated.  2. Right ventricular systolic function is normal. The right ventricular  size is normal.  3. The mitral valve was not well visualized. No doppler mitral valve  regurgitation.  4. Tricuspid valve regurgitation No doppler.  5. The aortic valve was not well visualized. Aortic valve regurgitation  No doppler.  6. Post-stress: left ventricular systolic function was <65%.  7. Pre-stress: left ventricular systolic function was 60-65%.  8. Conclusion: Exercise ECG showed nonspecific ST changes, but PVCs were  noted with stress. The patient reported chest pain. There was no evidence  for exercise-induced wall motion abnormalities by echo imaging. No  evidence for ischemia.   FINDINGS  Left Ventricle: Left ventricular ejection fraction, by estimation, is 60  to 65%. The left ventricle has normal function. The left ventricle has no  regional wall motion abnormalities. There is mild. There is mild left  ventricular hypertrophy.   Right Ventricle: The right ventricular size is normal. No increase in  right ventricular wall thickness. Right ventricular systolic function is  normal.   Left Atrium: Left atrial size was not well visualized.   Right Atrium: Right atrial size was not well visualized.   Pericardium: There is no evidence of pericardial effusion.   Mitral Valve: The mitral valve was not well visualized. No doppler mitral  valve regurgitation.   Tricuspid Valve:  The tricuspid valve is not well visualized. Tricuspid  valve regurgitation No doppler.   Aortic Valve: The aortic valve was not well visualized. Aortic valve  regurgitation No doppler.   Pulmonic Valve: The pulmonic valve was not well visualized. Pulmonic valve  regurgitation is not visualized.   Aorta: The aortic root was not well visualized.   Shunts: The interatrial septum was not well visualized.     Marca Anconaalton Mclean MD  Electronically signed by Marca Anconaalton Mclean MD  Signature Date/Time: 11/25/2019/9:01:30 PM        TRANSESOPHOGEAL ECHO REPORT       Patient Name:  Leary RocaMOHAMADOU M. Ritthaler Date of Exam: 12/16/2019  Medical Rec #: [id number]      Height:    69.0 in  Accession #:  [id number]     Weight:    184.2 lb  Date of Birth: 1966-07-31     BSA:     1.995 m  Patient Age:  53 years      BP:      131/80 mmHg  Patient Gender: M          HR:      78 bpm.  Exam Location: Inpatient   Procedure: Transesophageal Echo                 MODIFIED REPORT:  This report was modified by Weston BrassGayatri Acharya MD on 12/19/2019 due to  amendment.  Indications:   I05.0 Rheumatic mitral stenosis    History:     Patient has prior history of Echocardiogram examinations.          Mitral Valve Disease.    Sonographer:   Margreta Journeyichard Lombardo  Referring Phys: [id number] [facility]  Diagnosing Phys: Weston BrassGayatri Acharya MD   PROCEDURE: After discussion of the risks and benefits of a TEE, an  informed consent was obtained from the patient. The transesophogeal probe  was passed without difficulty through the esophogus of the patient. Local  oropharyngeal anesthetic was provided  with Cetacaine. Sedation performed by different physician. The patient was  monitored while under deep sedation. The patient's vital signs; including  heart rate, blood pressure, and oxygen saturation; remained stable  throughout the  procedure. The patient  developed no complications during the procedure.   IMPRESSIONS    1. Left ventricular ejection fraction, by estimation, is 60 to 65%. The  left ventricle has normal function. The left ventricle has no regional  wall motion abnormalities.  2. Right ventricular systolic function is normal. The right ventricular  size is normal. moderately increased right ventricular wall thickness.  3. Left atrial size was severely dilated. No left atrial/left atrial  appendage thrombus was detected.  4. Right atrial size was mildly dilated.  5. Severe, rheumatic mitral valve stenosis. Commisural fusion. Diastolic  doming particularly of anterior mitral leaflet. Moderate leaflet  calcifications. Mild calcification of the submitral apparatus, somewhat  obscured by shadowing. Variable cycle  length from ectopy impacts quantitation. 3D planimetry MVA = 1 cm2. MVA by  PHT with regular R-R = 1.50 cm2. MVA by PISA using a radius of 1 cm and  angle of 120  deg = 0.9 cm2. MVA by continuity equation = 1.29 cm2. Mean  gradient 6 mmHg at HR of 71 bpm.  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  The mitral valve is abnormal. Mild mitral valve regurgitation. Severe  mitral stenosis.  6. The aortic valve is grossly normal. Aortic valve regurgitation is  trivial.  7. Agitated saline contrast bubble study was negative, with no evidence  of any interatrial shunt.   FINDINGS  Left Ventricle: Left ventricular ejection fraction, by estimation, is 60  to 65%. The left ventricle has normal function. The left ventricle has no  regional wall motion abnormalities. The left ventricular internal cavity  size was normal in size. There is  no left ventricular hypertrophy.   Right Ventricle: The right ventricular size is normal. Moderately  increased right ventricular wall thickness. Right ventricular systolic  function is normal.   Left Atrium: No LA mural  thrombus or LAA thrombus noted. Left atrial size  was severely dilated. No left atrial/left atrial appendage thrombus was  detected.   Right Atrium: Right atrial size was mildly dilated. Prominent Eustachian  valve.   Pericardium: There is no evidence of pericardial effusion.   Mitral Valve: Severe, rheumatic mitral valve stenosis. Commisural fusion.  Diastolic doming particularly of anterior mitral leaflet. Moderate leaflet  calcifications. Mild calcification of the submitral apparatus, somewhat  obscured by shadowing. Variable  cycle length from ectopy impacts quantitation. 3D planimetry MVA = 1 cm2.  MVA by PHT with regular R-R = 1.50 cm2. MVA by PISA using a radius of 1 cm  and angle of 120 deg = 0.9 cm2. MVA by continuity equation = 1.29 cm2.  Mean gradient 6 mmHg at HR of 71  bpm. These findings in combination suggest severe rheumatic mitral  stenosis. The mitral valve is abnormal. There is moderate thickening of  the mitral valve leaflet(s). There is moderate calcification of the mitral  valve leaflet(s). Severely decreased  mobility of the mitral valve leaflets. Mild mitral valve regurgitation.  Severe mitral valve stenosis. MV peak gradient, 13.0 mmHg. The mean mitral  valve gradient is 6.0 mmHg.   Tricuspid Valve: The tricuspid valve is normal in structure. Tricuspid  valve regurgitation is trivial.   Aortic Valve: The aortic valve is grossly normal. Aortic valve  regurgitation is trivial. There is mild calcification of the aortic valve.   Pulmonic Valve: The pulmonic valve was normal in structure. Pulmonic valve  regurgitation is trivial.   Aorta: The aortic root and ascending aorta are structurally normal, with  no evidence of dilitation.   IAS/Shunts: No atrial level shunt detected by color flow Doppler. Agitated  saline contrast was given intravenously to evaluate for intracardiac  shunting. Agitated saline contrast bubble study was negative, with no  evidence of  any interatrial shunt. There  is no evidence of a patent foramen ovale.     LEFT VENTRICLE  PLAX 2D  LVOT diam:   2.10 cm  LV SV:     69  LV SV Index:  35  LVOT Area:   3.46 cm     AORTIC VALVE  LVOT Vmax:  125.00 cm/s  LVOT Vmean: 76.900 cm/s  LVOT VTI:  0.199 m    AORTA  Ao Sinus diam: 3.10 cm   MITRAL VALVE  MV Area (PHT): 1.50 cm  SHUNTS  MV Peak grad: 13.0 mmHg Systemic VTI: 0.20 m  MV Mean grad: 6.0 mmHg  Systemic Diam: 2.10 cm  MV Vmax:  1.80 m/s  MV Vmean:   114.0 cm/s   Weston Brass MD  Electronically signed by Weston Brass MD  Signature Date/Time: 12/17/2019/6:47:54 AM        RIGHT/LEFT HEART CATH AND CORONARY ANGIOGRAPHY     Conclusion Conclusions:  1. Two vessel coronary artery disease with 70% stenosis involving ostium of OM3. There is mild disease involving the LAD and distal LCx. 2. Severe mitral stenosis (mean gradient 10 mmHg, valve area 1.3 cm^2). 3. Moderately elevated PCWP and normal LVEDP, consistent with mitral stenosis. 4. Mildly to moderately elevated right heart and pulmonary artery pressures. 5. Normal Fick cardiac output/index. Recommendations:  1. Ongoing evaluation and management of rheumatic heart disease with severe mitral stenosis per Dr. Duke Burns. 2. Start aspirin 81 mg daily. 3. Optimize medical therapy and risk factor modification for coronary artery disease. Consider addition of statin therapy when the patient is seen for follow-up. Defer PCI to [facility] pending further workup of valvular heart disease. Melvin Kendall, MD  [facility]      Recommendations Antiplatelet/Anticoag Recommend Aspirin  daily for moderate CAD.   Discharge Date In the absence of any other complications or medical issues, we expect the patient to be ready for discharge from a cath perspective on 12/16/2019.     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 year-old man with history of rheumatic heart disease with moderate to severe mitral stenosis, presenting for evaluation of dyspnea on exertion.  GFR: >60 ml/min  Procedure: The risks, benefits, complications, treatment options, and expected outcomes were discussed with the patient. The patient and/or family concurred with the proposed plan, giving informed consent. The patient was sedated with IV midazolam and fentanyl. The right wrist was assessed with a modified Allens test which was normal. The right wrist and elbow were prepped and draped in a sterile fashion. 1% lidocaine was used for local anesthesia. A previously placed antecubital vein IV was exchanged for a 857F slender Glidesheath using modified Seldinger technique. Right heart catheterization was performed by advancing a 857F balloon-tipped catheter through the right heart chambers into the pulmonary capillary wedge position. Pressure measurements and oxygen saturations were obtained.  Using the modified Seldinger access technique, a 33F slender Glidesheath was placed in the right radial artery. 3 mg Verapamil was given through the sheath. Heparin 4,000 units were administered. Selective coronary angiography was performed using 857F JL3.5 and JR4 catheters to engage the left and right coronary arteries, respectively. Left heart catheterization was performed using a 857F angled pigtail catheter. Simultaneous LV and PCWP pressures were obtained to calculate a transmitral gradient. Left ventriculogram was not performed.  At the end of the procedure, the radial artery sheath was removed and a TR band applied to achieve patent hemostasis. There were no immediate complications. The patient was taken to the recovery area in stable condition.  Estimated blood loss <50 mL.   During this procedure medications were administered to achieve  and maintain moderate conscious sedation while the patient's heart rate, blood pressure, and oxygen saturation were continuously monitored and I was present face-to-face 100% of this time.     Medications (Filter: Administrations occurring from 12/16/19 1355 to 12/16/19 1510)  Heparin (Porcine) in NaCl 1000-0.9 UT/500ML-% SOLN (mL) Total volume: 1,000 mL  Date/Time   Rate/Dose/Volume Action  12/16/19 1418  500 mL Given  1418  500 mL Given  lidocaine (PF) (XYLOCAINE) 1 % injection (mL) Total volume: 4 mL  Date/Time   Rate/Dose/Volume Action  12/16/19 1420  2 mL Given  1431  2 mL Given  fentaNYL (SUBLIMAZE) injection (mcg) Total dose: 25 mcg  Date/Time   Rate/Dose/Volume Action  12/16/19 1420  25 mcg Given  midazolam (VERSED) injection (mg) Total dose: 1 mg  Date/Time   Rate/Dose/Volume Action  12/16/19 1421  1 mg Given  Radial Cocktail/Verapamil only (mL) Total volume: 10 mL  Date/Time   Rate/Dose/Volume Action  12/16/19 1432  10 mL Given  heparin injection (Units) Total dose: 4,000 Units  Date/Time   Rate/Dose/Volume Action  12/16/19 1433  4,000 Units Given  iohexol (OMNIPAQUE) 350 MG/ML injection (mL) Total volume: 35 mL  Date/Time   Rate/Dose/Volume Action  12/16/19 1458  35 mL Given     Sedation Time Sedation Time Physician-1: 33 minutes 11 seconds        Contrast Medication Name Total Dose  iohexol (OMNIPAQUE) 350 MG/ML injection 35 mL     Radiation/Fluoro Fluoro time: 6.3 (min)  DAP: 21185 (mGycm2)  Cumulative Air Kerma: 412 (mGy)     Complications Complications documented before study signed (12/16/2019 3:44 PM)  No complications were associated with this study.  Documented by Melvin Kendall, MD - 12/16/2019 3:38 PM     Coronary Findings Diagnostic Dominance: Right  Left Main  Vessel is large. Vessel is angiographically normal.   Left Anterior Descending  Vessel is large.  Prox LAD lesion 20% stenosed  Prox LAD lesion is 20% stenosed.  The lesion is eccentric.  Mid LAD lesion 20% stenosed  Mid LAD lesion is 20% stenosed. The lesion is eccentric. The lesion is moderately calcified.   First Diagonal Branch  Vessel is small in size.   Second Diagonal Branch  Vessel is small in size.   Third Diagonal Branch  Vessel is small in size.   Left Circumflex  Vessel is large.  Dist Cx lesion 30% stenosed  Dist Cx lesion is 30% stenosed.   First Obtuse Marginal Branch  Vessel is small in size.   Second Obtuse Marginal Branch  Vessel is small in size.   Third Obtuse Marginal Branch  Vessel is moderate in size.  3rd Mrg lesion 70% stenosed  3rd Mrg lesion is 70% stenosed.   Fourth Obtuse Marginal Branch  Vessel is moderate in size.   Right Coronary Artery  Vessel is large. Vessel is angiographically normal.   Right Posterior Descending Artery  Vessel is moderate in size.   Right Posterior Atrioventricular Artery  Vessel is small in size.   First Right Posterolateral Branch  Vessel is small in size.   Second Right Posterolateral Branch  Vessel is small in size.  Intervention No interventions have been documented.          Right Heart Right Heart Pressures RA (mean): 10 mmHg RV (S/EDP): 53/10 mmHg PA (S/D, mean): 53/28 (36) mmHg PCWP (mean): 25 mmHg  Ao sat: 98% PA sat: 71%  Fick CO: 5.1 L/min Fick CI: 2.6 L/min/m^2                 Left Heart Left Ventricle LV end diastolic pressure is normal. LVEDP 10 mmHg.   Mitral Valve There is severe mitral valve stenosis. Mean gradient 10 mmHg. Valve area 1.3 cm^2.   Aortic Valve There is no aortic valve stenosis.     Coronary Diagrams Diagnostic Dominance: Right  &&&&&  Intervention  Implants  No implant documentation for this case.      [employer] Images Link to Procedure Log  Show images for CARDIAC CATHETERIZATION Procedure Log     Images on Long Term Storage   Show images for [place], Emmette M.         Hemo Data    Most Recent Value  Fick Cardiac Output 5.14 L/min  Fick Cardiac Output Index 2.57 (L/min)/BSA  Mitral Mean Gradient 9.96 mmHg  Mitral Peak Gradient 9 mmHg  Mitral Valve Area Index 0.65 cm2/BSA  RA A Wave 14 mmHg  RA V Wave 9 mmHg  RA Mean 8 mmHg  RV Systolic Pressure 43 mmHg  RV Diastolic Pressure 0 mmHg  RV EDP 9 mmHg  PA Systolic Pressure 51 mmHg  PA Diastolic Pressure 27 mmHg  PA Mean 34 mmHg  PW A Wave 23 mmHg  PW V Wave 19 mmHg  PW Mean 16 mmHg  AO Systolic Pressure 105 mmHg  AO Diastolic Pressure 57 mmHg  AO Mean 77 mmHg  LV Systolic Pressure 105 mmHg  LV Diastolic Pressure 2 mmHg  LV EDP 7 mmHg  AOp Systolic Pressure 110 mmHg  AOp Diastolic Pressure 65 mmHg  AOp Mean Pressure 84 mmHg  LVp Systolic Pressure 101 mmHg  LVp Diastolic Pressure 1 mmHg  LVp EDP Pressure 6 mmHg  QP/QS 1  TPVR Index 13.23 HRUI  TSVR Index 29.95 HRUI  PVR SVR Ratio 0.29  TPVR/TSVR Ratio 0.44     Impression:  Patient has rheumatic heart disease with stage D severe symptomatic mitral stenosis and mild to moderate mitral regurgitation.  He describes a several month history of exertional shortness of breath that typically occurs only with more strenuous physical exertion, consistent with chronic diastolic congestive heart failure, New York Heart Association functional class I-II.  He also has some atypical chest discomfort that is not related to physical exertion and occasional palpitations.  Recent stress echocardiogram was associated with onset of symptoms and episodes of nonsustained VT.  I have personally reviewed the patient's recent transthoracic and transesophageal echocardiograms as well as his diagnostic cardiac catheterization.  Echocardiograms reveal classical evidence for severe rheumatic mitral stenosis with mild to moderate mitral regurgitation.  There is severe left atrial enlargement with normal left ventricular size and systolic function.  Right heart size and function appeared  normal and there is trivial tricuspid regurgitation.  Diagnostic cardiac catheterization confirmed the presence of severe mitral stenosis and revealed mild to moderately elevated right heart pressures.  Coronary angiography reveals 70% ostial stenosis of an obtuse marginal branch of the left circumflex coronary artery but otherwise nonobstructive coronary artery disease.  I agree that it would be reasonable to consider elective mitral valve replacement in the near future.  Options include continued medical therapy with very close observation on medical therapy although the patient will remain at significant risk for progression of symptoms and/or the development of atrial fibrillation.  Functional anatomy of mitral valve does not appear favorable for an attempt at balloon mitral valvuloplasty.  Patient would likely be a good candidate for minimally invasive approach for surgery although this would obviate any possibility that concomitant surgical revascularization of the obtuse marginal branch of the left circumflex could be performed at the time of surgery.  The patient will likely need dental extraction performed prior to surgery.   Plan:  The patient was counseled at length regarding the indications, risks and potential benefits of mitral valve replacement.  The rationale for elective surgery has  been explained, including a comparison between surgery and continued medical therapy with close follow-up.  Alternative surgical approaches have been discussed including a comparison between conventional sternotomy and minimally-invasive techniques, as well as the presence of likely asymptomatic coronary artery disease with 70% stenosis of the obtuse marginal branch of the left circumflex coronary artery.  The relative risks and benefits of each have been reviewed as they pertain to the patient's specific circumstances, and expectations for the patient's postoperative convalescence has been discussed.  We  discussed the possibility of replacing the mitral valve using a mechanical prosthesis with the attendant need for long-term anticoagulation versus the alternative of replacing it using a bioprosthetic tissue valve with its potential for late structural valve deterioration and failure, depending upon the patient's longevity.  The relevance of the presence of poor dentition was explained including the need for preoperative dental service consultation and possible extraction.     The patient is interested in proceeding with surgical intervention in the near future.  As a next step he will be referred to the dental service clinic for evaluation and possible dental extraction.  He will also undergo CT angiography to evaluate the feasibility of peripheral cannulation for surgery.  Finally, the patient has been strongly encouraged to quit smoking and has been referred to the quit smoking program for assistance.    I spent in excess of 90 minutes during the conduct of this office consultation and >50% of this time involved direct face-to-face encounter with the patient for counseling and/or coordination of their care.   Valentina Gu. Roxy Manns, MD 12/23/2019 9:43 AM

## 2019-12-24 ENCOUNTER — Encounter (HOSPITAL_COMMUNITY): Payer: Self-pay | Admitting: Dentistry

## 2019-12-24 ENCOUNTER — Ambulatory Visit (HOSPITAL_COMMUNITY): Payer: Self-pay | Admitting: Dentistry

## 2019-12-24 VITALS — BP 144/81 | HR 77 | Temp 98.2°F

## 2019-12-24 DIAGNOSIS — K0889 Other specified disorders of teeth and supporting structures: Secondary | ICD-10-CM

## 2019-12-24 DIAGNOSIS — K08409 Partial loss of teeth, unspecified cause, unspecified class: Secondary | ICD-10-CM

## 2019-12-24 DIAGNOSIS — M278 Other specified diseases of jaws: Secondary | ICD-10-CM

## 2019-12-24 DIAGNOSIS — M264 Malocclusion, unspecified: Secondary | ICD-10-CM

## 2019-12-24 DIAGNOSIS — Z01818 Encounter for other preprocedural examination: Secondary | ICD-10-CM | POA: Diagnosis not present

## 2019-12-24 DIAGNOSIS — M270 Developmental disorders of jaws: Secondary | ICD-10-CM

## 2019-12-24 DIAGNOSIS — K036 Deposits [accretions] on teeth: Secondary | ICD-10-CM

## 2019-12-24 DIAGNOSIS — I050 Rheumatic mitral stenosis: Secondary | ICD-10-CM

## 2019-12-24 DIAGNOSIS — Z972 Presence of dental prosthetic device (complete) (partial): Secondary | ICD-10-CM

## 2019-12-24 DIAGNOSIS — K045 Chronic apical periodontitis: Secondary | ICD-10-CM

## 2019-12-24 DIAGNOSIS — K0530 Chronic periodontitis, unspecified: Secondary | ICD-10-CM

## 2019-12-24 DIAGNOSIS — K06010 Localized gingival recession, unspecified: Secondary | ICD-10-CM

## 2019-12-24 NOTE — Patient Instructions (Signed)
COVID-19 Education: The signs and symptoms of COVID-19 were discussed with the patient and how to seek care for testing (follow up with PCP or arrange E-visit).   The importance of social distancing was discussed today.  COVID-19 Vaccine Information can be found at: [email or web address] For questions related to vaccine distribution or appointments, please email [email or web address] or call [phone].       [place]    Department of Dental Medicine     DR. Ziair Penson      HEART VALVES AND MOUTH CARE:  FACTS:   If you have any infection in your mouth, it can infect your heart valve.  If you heart valve is infected, you will be seriously ill.  Infections in the mouth can be SILENT and do not always cause pain.  Examples of infections in the mouth are gum disease, dental cavities, and abscesses.  Some possible signs of infection are: Bad breath, bleeding gums, or teeth that are sensitive to sweets, hot, and/or cold. There are many other signs as well.  WHAT YOU HAVE TO DO:   Brush your teeth after meals and at bedtime. Spend at least 2 minutes brushing well, especially behind your back teeth and all around your teeth that stand alone. Brush at the gumline also.  Do not go to bed without brushing your teeth and flossing.  If you gums bleed when you brush or floss, do NOT stop brushing or flossing. It usually means that your gums need more attention and better cleaning.   If your Dentist or Dr. Lakishia Bourassa gave you a prescription mouthwash to use, make sure to use it as directed. If you run out of the medication, get a refill at the pharmacy.   If you were given any other medications or directions by your Dentist, please follow them. If you did not understand the directions or forget what you were told, please call. We will be happy to refresh her memory.  If you need antibiotics before dental procedures, make sure you take  them one hour prior to every dental visit as directed.   Get a dental checkup every 4-6 months in order to keep your mouth healthy, or to find and treat any new infection. You will most likely need your teeth cleaned or gums treated at the same time.  If you are not able to come in for your scheduled appointment, call your Dentist as soon as possible to reschedule.  If you have a problem in between dental visits, call your Dentist.  

## 2019-12-24 NOTE — Progress Notes (Signed)
DENTAL CONSULTATION  Date of Consultation:  12/24/2019 Patient Name:   Melvin Burns Date of Birth:   May 23, 1966 Medical Record Number: [id number]  COVID 19 SCREENING: The patient does not symptoms concerning for COVID-19 infection (Including fever, chills, cough, or new SHORTNESS OF BREATH).    VITALS: BP (!) 144/81 (BP Location: Right Arm)   Pulse 77   Temp 98.2 F (36.8 C)   CHIEF COMPLAINT: Patient referred by Dr. Roxy Burns for a dental consultation.  HPI: Melvin Burns is a 54 year old male recently diagnosed with severe mitral stenosis.  Patient with anticipated mitral valve repair replacement with Dr. Roxy Burns.  Patient is now seen as part of a medically necessary preheart valve surgery dental protocol examination.  The patient currently denies acute toothaches, swellings, or abscesses.  Patient was last seen by his primary dentist, Dr. Conception Burns, on 06/03/2019 for exam and radiographs.  Patient was then referred to Dr. Hoyt Burns, oral surgeon, for multiple extractions.  Patient apparently did not follow-up for dental treatment at that time.  Patient indicates that he has a maxillary acrylic partial denture.  The acrylic partial denture was fabricated approximately 7 to 8 years ago.  This partial denture is ill fitting by patient report.  Patient denies having a lower partial denture.  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  PROBLEM LIST: Patient Active Problem List   Diagnosis Date Noted  . Mitral valve stenosis, severe 08/15/2017    Priority: High  . Pre-operative clearance 06/11/2019  . Moderate mitral regurgitation 08/15/2017  . Abdominal pain, epigastric 01/02/2017  . Non-intractable vomiting with nausea 01/02/2017  . 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 Hypertension, benign 01/09/2014  . Undiagnosed cardiac murmurs 01/09/2014  . Accelerated hypertension 03/11/2013  . Family history of stroke 03/11/2013  . Essential hypertension 03/11/2013  . Smoker  03/11/2013  . Chronic periodontitis, unspecified 03/11/2013    PMH: Past Medical History:  Diagnosis Date  . Hypertension   . Mild aortic stenosis   . Mitral valve stenosis, severe   . Moderate mitral regurgitation   . Rheumatic mitral stenosis with regurgitation    Echo 08/08/16:  Severe mitral stenosis (mean gradient 17 mmHg).  Moderate mitral regurgitation.    PSH: Past Surgical History:  Procedure Laterality Date  . BILATERAL KNEE ARTHROSCOPY    . BUBBLE STUDY  12/16/2019   Procedure: BUBBLE STUDY;  Surgeon: Melvin Munroe, MD;  Location[facility] ENDOSCOPY;  Service: Cardiology;;  . RIGHT/LEFT HEART CATH AND CORONARY ANGIOGRAPHY N/A 12/16/2019   Procedure: RIGHT/LEFT HEART CATH AND CORONARY ANGIOGRAPHY;  Surgeon: Melvin Bush, MD;  Location: [facility];  Service: Cardiovascular;  Laterality: N/A;  . TEE WITHOUT CARDIOVERSION N/A 12/16/2019   Procedure: TRANSESOPHAGEAL ECHOCARDIOGRAM (TEE);  Surgeon: Melvin Munroe, MD;  Location: [facility];  Service: Cardiology;  Laterality: N/A;    ALLERGIES: No Known Allergies  MEDICATIONS: Current Outpatient Medications  Medication Sig Dispense Refill  . amLODipine (NORVASC) 10 MG tablet TAKE 1 TABLET(10 MG) BY MOUTH DAILY (Patient taking differently: Take 10 mg by mouth daily. ) 90 tablet 2  . aspirin EC 81 MG tablet Take 1 tablet (81 mg total) by mouth daily.    . chlorthalidone (HYGROTON) 25 MG tablet TAKE 1 TABLET(25 MG) BY MOUTH DAILY (Patient taking differently: Take 25 mg by mouth daily. ) 90 tablet 2  . ibuprofen (ADVIL) 200 MG tablet Take 400 mg by mouth every 6 (six) hours as needed for moderate pain.     Marland Kitchen lisinopril (  ZESTRIL) 10 MG tablet TAKE 1 TABLET(10 MG) BY MOUTH DAILY (Patient taking differently: Take 10 mg by mouth daily. ) 90 tablet 1   No current facility-administered medications for this visit.    LABS: Lab Results  Component Value Date   WBC 11.3 (H) 12/10/2019   HGB 14.3 12/16/2019   HCT 42.0  12/16/2019   MCV 87 12/10/2019   PLT 232 12/10/2019      Component Value Date/Time   NA 141 12/16/2019 1433   NA 136 12/10/2019 1621   K 3.4 (L) 12/16/2019 1433   CL 97 12/10/2019 1621   CO2 25 12/10/2019 1621   GLUCOSE 100 (H) 12/10/2019 1621   GLUCOSE 106 (H) 07/28/2019 0338   BUN 11 12/10/2019 1621   CREATININE 0.94 12/10/2019 1621   CREATININE 0.99 06/01/2016 1741   CALCIUM 10.3 (H) 12/10/2019 1621   GFRNONAA 91 12/10/2019 1621   GFRNONAA 88 06/01/2016 1741   GFRAA 105 12/10/2019 1621   GFRAA >89 06/01/2016 1741   No results found for: INR, PROTIME No results found for: PTT  SOCIAL HISTORY: Social History   Socioeconomic History  . Marital status: Married    Spouse name: Not on file  . Number of children: Not on file  . Years of education: Not on file  . Highest education level: Not on file  Occupational History  . Occupation: [employer] account executive  Tobacco Use  . Smoking status: Current Every Day Smoker    Packs/day: 1.00    Types: Cigarettes  . Smokeless tobacco: Never Used  Substance and Sexual Activity  . Alcohol use: No  . Drug use: No  . Sexual activity: Yes    Partners: Female  Other Topics Concern  . Not on file  Social History Narrative   Pt is married.  He is a former Database administrator.             Social Determinants of Health   Financial Resource Strain:   . Difficulty of Paying Living Expenses:   Food Insecurity:   . Worried About Programme researcher, broadcasting/film/video in the Last Year:   . Barista in the Last Year:   Transportation Needs:   . Freight forwarder (Medical):   Marland Kitchen Lack of Transportation (Non-Medical):   Physical Activity:   . Days of Exercise per Week:   . Minutes of Exercise per Session:   Stress:   . Feeling of Stress :   Social Connections:   . Frequency of Communication with Friends and Family:   . Frequency of Social Gatherings with Friends and Family:   . Attends Religious Services:   . Active Member of Clubs or  Organizations:   . Attends Banker Meetings:   Marland Kitchen Marital Status:   Intimate Partner Violence:   . Fear of Current or Ex-Partner:   . Emotionally Abused:   Marland Kitchen Physically Abused:   . Sexually Abused:     FAMILY HISTORY: Family History  Problem Relation Age of Onset  . Hypertension Mother   . Hypertension Father   . Stroke Father     REVIEW OF SYSTEMS: Reviewed with the patient as per History of present illness. Psych: Patient denies having dental phobia.  DENTAL HISTORY: CHIEF COMPLAINT: Patient referred by Dr. [name] for a dental consultation.  HPI: Melvin Burns is a 54 year old male recently diagnosed with severe mitral stenosis.  Patient with anticipated mitral valve repair replacement with Dr. [name].  Patient is now seen  as part of a medically necessary preheart valve surgery dental protocol examination.  The patient currently denies acute toothaches, swellings, or abscesses.  Patient was last seen by his primary dentist, Dr. Gala Romney, on 06/03/2019 for exam and radiographs.  Patient was then referred to Dr. Barbette Merino, oral surgeon, for multiple extractions.  Patient apparently did not follow-up for dental treatment at that time.  Patient indicates that he has a maxillary acrylic partial denture.  The acrylic partial denture was fabricated approximately 7 to 8 years ago.  This partial denture is ill fitting by patient report.  Patient denies having a lower partial denture.  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  DENTAL EXAMINATION: GENERAL: The patient is a well-developed, well-nourished male in no acute distress. HEAD AND NECK: There is no palpable neck lymphadenopathy.  The patient denies acute TMJ symptoms. INTRAORAL EXAM: Patient has normal saliva.  The patient has bilateral mandibular lingual tori.  There is no evidence of oral abscess formation. DENTITION: Patient is missing tooth numbers 1 through 15, 18, 19, 24, 25, 26, 27, 31, and 32. PERIODONTAL: Patient has  chronic, advanced periodontal disease with plaque and calculus accumulations, generalized gingival recession, generalized tooth mobility.  There is moderate to severe bone loss noted.Bilateral maxillary lateral exostoses.  The patient also has mandibular right lingual exostoses. DENTAL CARIES/SUBOPTIMAL RESTORATIONS: No obvious dental caries are noted. ENDODONTIC: The patient currently denies having acute pulpitis symptoms.  There are multiple areas of periapical pathology and radiolucency noted. CROWN AND BRIDGE: There are no crown or bridge restorations. PROSTHODONTIC: The patient has a maxillary acrylic partial denture that is ill fitting.  This was fabricated approximately 7 years ago.  There is no lower partial denture. OCCLUSION: Patient is a poor occlusal scheme secondary to multiple missing teeth, supra eruption and drifting of the unopposed teeth into the edentulous areas, lack f replacement of the missing teeth with clinically acceptable dental prostheses.  RADIOGRAPHIC INTERPRETATION: An orthopantogram was taken today and supplemented with 8 periapical radiographs. There are multiple missing teeth.  There is severe bone loss.  There is radiographic calculus noted.  There is supra eruption and drifting of the unopposed teeth into the edentulous areas.  There are multiple areas of periapical pathology and radiolucency.  ASSESSMENTS: 1.  Severe mitral stenosis 2.  Preheart valve surgery dental protocol 3.  Chronic apical periodontitis 4.  Chronic periodontitis with bone loss 5.  Gingival recession 6.  Accretions 7.  Generalized tooth mobility 8.  Bilateral mandibular lingual tori 9.  Bilateral maxillary buccal exostoses 10.  Mandibular right lingual exostoses 11.  Multiple missing teeth 12.  Supra eruption and drifting of the unopposed teeth into the edentulous areas 13.  Ill fitting maxillary acrylic partial denture. 14.  Poor occlusal scheme and malocclusion 15.  Risk for  complications up to and including death with anticipated invasive dental procedures in the operating room with general anesthesia. 13.  Need for antibiotic premedication prior to invasive dental procedures due to anticipated heart valve surgery in the near future.   PLAN/RECOMMENDATIONS: 1. I discussed the risks, benefits, and complications of various treatment options with the patient in relationship to his medical and dental conditions, anticipated heart valve surgery, and risk for endocarditis. We discussed various treatment options to include no treatment, multiple extractions with alveoloplasty, pre-prosthetic surgery as indicated, periodontal therapy, dental restorations, root canal therapy, crown and bridge therapy, implant therapy, and replacement of missing teeth as indicated. The patient currently wishes to proceed with extraction of remaining teeth with  alveoloplasty and preprosthetic surgery as needed in the operating room with general anesthesia.  This has been scheduled for [facility] operating room on 12/31/2019 at 7:30 AM.  Presurgical testing will be arranged for labs and Covid testing as indicated.  Patient will need to follow-up with a dentist of her choice for fabrication of new upper and lower complete dentures after adequate healing and once medically stable from the anticipated heart valve surgery.  Heart valve surgery will be scheduled by Dr. [name] at his discretion after adequate healing from the dental extraction procedures.   2. Discussion of findings with medical team and coordination of future medical and dental care as needed.  I spent in excess of  120 minutes during the conduct of this consultation and >50% of this time involved direct face-to-face encounter for counseling and/or coordination of the patient's care.    Charlynne Pander, DDS

## 2019-12-26 NOTE — Pre-Procedure Instructions (Signed)
[facility] [address], [place] - [address] [place] [address] Phone: [phone] Fax: [phone]    Your procedure is scheduled on Tues., December 31, 2019 from 7:30AM-10:30AM   Report to [facility] Entrance "A" at 5:30AM  Call this number if you have problems the morning of surgery:  [phone]   Remember:  Do not eat or drink after midnight on March 22nd    Take these medicines the morning of surgery with A SIP OF WATER: AmLODipine (NORVASC)  Follow your surgeon's instructions on when to stop Aspirin.  If no instructions were given by your surgeon then you will need to call the office to get those instructions.    As of today, stop taking all Aspirin (unless instructed by your doctor) and Other Aspirin containing products, Vitamins, Fish oils, and Herbal medications. Also stop all NSAIDS i.e. Advil, Ibuprofen, Motrin, Aleve, Anaprox, Naproxen, BC, Goody Powders, and all Supplements.  No Smoking of any kind, Tobacco, or Alcohol products 24 hours prior to your procedure. If you use a Cpap at night, you may bring all equipment for your overnight stay.    Do not wear jewelry.  Do not wear lotions, powders, colognes, or deodorant.  Do not shave 48 hours prior to surgery.  Men may shave face and neck.  Do not bring valuables to the hospital.  [facility] is not responsible for any belongings or valuables.  Contacts, dentures or bridgework may not be worn into surgery.    For patients admitted to the hospital, discharge time will be determined by your treatment team.  Patients discharged the day of surgery will not be allowed to drive home, and someone needs to stay with them for 24 hours.   Special instructions:  [facility]- Preparing For Surgery  Before surgery, you can play an important role. Because skin is not sterile, your skin needs to be as free of germs as possible. You can reduce the number of germs on your  skin by washing with CHG (chlorahexidine gluconate) Soap before surgery.  CHG is an antiseptic cleaner which kills germs and bonds with the skin to continue killing germs even after washing.    Oral Hygiene is also important to reduce your risk of infection.  Remember - BRUSH YOUR TEETH THE MORNING OF SURGERY WITH YOUR REGULAR TOOTHPASTE  Please do not use if you have an allergy to CHG or antibacterial soaps. If your skin becomes reddened/irritated stop using the CHG.  Do not shave (including legs and underarms) for at least 48 hours prior to first CHG shower. It is OK to shave your face.  Please follow these instructions carefully.   1. Shower the NIGHT BEFORE SURGERY and the MORNING OF SURGERY with CHG.   2. If you chose to wash your hair, wash your hair first as usual with your normal shampoo.  3. After you shampoo, rinse your hair and body thoroughly to remove the shampoo.  4. Use CHG as you would any other liquid soap. You can apply CHG directly to the skin and wash gently with a scrungie or a clean washcloth.   5. Apply the CHG Soap to your body ONLY FROM THE NECK DOWN.  Do not use on open wounds or open sores. Avoid contact with your eyes, ears, mouth and genitals (private parts). Wash Face and genitals (private parts)  with your normal soap.  6. Wash thoroughly, paying special attention to the area where your surgery  will be performed.  7. Thoroughly rinse your body with warm water from the neck down.  8. DO NOT shower/wash with your normal soap after using and rinsing off the CHG Soap.  9. Pat yourself dry with a CLEAN TOWEL.  10. Wear CLEAN PAJAMAS to bed the night before surgery, wear comfortable clothes the morning of surgery  11. Place CLEAN SHEETS on your bed the night of your first shower and DO NOT SLEEP WITH PETS.   Day of Surgery:  Do not apply any deodorants/lotions.  Please wear clean clothes to the hospital/surgery center.   Remember to brush your teeth WITH  YOUR REGULAR TOOTHPASTE.  Please read over the following fact sheets that you were given.

## 2019-12-27 ENCOUNTER — Encounter (HOSPITAL_COMMUNITY)
Admission: RE | Admit: 2019-12-27 | Discharge: 2019-12-27 | Disposition: A | Discharge status: Home / Self Care | Payer: No Typology Code available for payment source | Source: Ambulatory Visit | Attending: Dentistry | Admitting: Dentistry

## 2019-12-27 ENCOUNTER — Other Ambulatory Visit: Payer: Self-pay

## 2019-12-27 ENCOUNTER — Encounter (HOSPITAL_COMMUNITY): Payer: Self-pay

## 2019-12-27 ENCOUNTER — Other Ambulatory Visit (HOSPITAL_COMMUNITY)
Admission: RE | Admit: 2019-12-27 | Discharge: 2019-12-27 | Disposition: A | Discharge status: Home / Self Care | Payer: No Typology Code available for payment source | Source: Ambulatory Visit | Attending: Dentistry | Admitting: Dentistry

## 2019-12-27 DIAGNOSIS — Z01812 Encounter for preprocedural laboratory examination: Secondary | ICD-10-CM | POA: Insufficient documentation

## 2019-12-27 DIAGNOSIS — Z20822 Contact with and (suspected) exposure to covid-19: Secondary | ICD-10-CM | POA: Diagnosis not present

## 2019-12-27 HISTORY — DX: Pneumonia, unspecified organism: J18.9

## 2019-12-27 HISTORY — DX: Presence of spectacles and contact lenses: Z97.3

## 2019-12-27 HISTORY — DX: Presence of dental prosthetic device (complete) (partial): Z97.2

## 2019-12-27 LAB — BASIC METABOLIC PANEL
Anion gap: 9 (ref 5–15)
BUN: 8 mg/dL (ref 6–20)
CO2: 30 mmol/L (ref 22–32)
Calcium: 10 mg/dL (ref 8.9–10.3)
Chloride: 100 mmol/L (ref 98–111)
Creatinine, Ser: 0.94 mg/dL (ref 0.61–1.24)
GFR calc Af Amer: >60 mL/min (ref >60)
GFR calc non Af Amer: >60 mL/min (ref >60)
Glucose, Bld: 106 mg/dL — ABNORMAL HIGH (ref 70–99)
Potassium: 3.6 mmol/L (ref 3.5–5.1)
Sodium: 139 mmol/L (ref 135–145)

## 2019-12-27 LAB — CBC
HCT: 44.9 % (ref 39.0–52.0)
Hemoglobin: 14.1 g/dL (ref 13.0–17.0)
MCH: 28.2 pg (ref 26.0–34.0)
MCHC: 31.4 g/dL (ref 30.0–36.0)
MCV: 89.8 fL (ref 80.0–100.0)
Platelets: 208 10*3/uL (ref 150–400)
RBC: 5 MIL/uL (ref 4.22–5.81)
RDW: 12.6 % (ref 11.5–15.5)
WBC: 9.1 10*3/uL (ref 4.0–10.5)
nRBC: 0 % (ref 0.0–0.2)

## 2019-12-27 LAB — SARS CORONAVIRUS 2 (TAT 6-24 HRS): SARS Coronavirus 2: NEGATIVE

## 2019-12-27 NOTE — Progress Notes (Addendum)
Patient denies shortness of breath, fever, cough and chest pain.  PCP -   None - Urgent Care Cardiologist - Dr Chilton Si  Chest x-ray - 07/28/19, 1 View EKG - 12/10/19 Stress Test - 11/25/19 ECHO TEE - 12/16/19 Cardiac Cath - 12/16/19  Anesthesia review: Yes. Fayrene Fearing, [place] informed MD order.  Patient states that he does not have any trouble breathing, no nose bleeds and no problems with anesthesia in in the past.  Coronavirus Screening Covid test scheduled 12/27/19 at 11:35 am after PAT appt. Have you experienced the following symptoms:  Cough yes/no: No Fever (>100.75F)  yes/no: No Runny nose yes/no: No Sore throat yes/no: No Difficulty breathing/shortness of breath  yes/no: No  Have you traveled in the last 14 days and where? yes/no: No  Patient verbalized understanding of instructions that were given to them at the PAT appointment.

## 2019-12-30 ENCOUNTER — Encounter (HOSPITAL_COMMUNITY): Payer: Self-pay | Admitting: Dentistry

## 2019-12-30 NOTE — Progress Notes (Signed)
Anesthesia Chart Review:  Follows with cardiology for hx of Rheumatic mitral valve disease, NSVT, severe mitral stenosis and moderate mitral regurgitation, mild aortic stenosis, hypertension . Requires dental extractions in preparation for Mitral valve replacement for stage D severe symptomatic mitral stenosis and mild to moderate mitral regurgitation.  Preop labs reviewed, unremarkable.   EKG 12/10/19: Sinus rhythm with premature atrial complexes.  Possible LAE.  Rate 85.  LVH with repol abnormality.  R/L cath 12/16/19: Conclusions: 1. Two vessel coronary artery disease with 70% stenosis involving ostium of OM3.  There is mild disease involving the LAD and distal LCx. 2. Severe mitral stenosis (mean gradient 10 mmHg, valve area 1.3 cm^2). 3. Moderately elevated PCWP and normal LVEDP, consistent with mitral stenosis. 4. Mildly to moderately elevated right heart and pulmonary artery pressures. 5. Normal Fick cardiac output/index.  Recommendations: 1. Ongoing evaluation and management of rheumatic heart disease with severe mitral stenosis per Dr. Duke Salvia. 2. Start aspirin 81 mg daily. 3. Optimize medical therapy and risk factor modification for coronary artery disease.  Consider addition of statin therapy when the patient is seen for follow-up.  Defer PCI to [facility] pending further workup of valvular heart disease.  TEE 12/16/19: 1. Left ventricular ejection fraction, by estimation, is 60 to 65%. The  left ventricle has normal function. The left ventricle has no regional  wall motion abnormalities.  2. Right ventricular systolic function is normal. The right ventricular  size is normal. moderately increased right ventricular wall thickness.  3. Left atrial size was severely dilated. No left atrial/left atrial  appendage thrombus was detected.  4. Right atrial size was mildly dilated.  5. Severe, rheumatic mitral valve stenosis. Commisural fusion. Diastolic  doming particularly of anterior  mitral leaflet. Moderate leaflet  calcifications. Mild calcification of the submitral apparatus, somewhat  obscured by shadowing. Variable cycle  length from ectopy impacts quantitation. 3D planimetry MVA = 1 cm2. MVA by  PHT with regular R-R = 1.50 cm2. MVA by PISA using a radius of 1 cm and  angle of 120 deg = 0.9 cm2. MVA by continuity equation = 1.29 cm2. Mean  gradient 6 mmHg at HR of 71 bpm.  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  The mitral valve is abnormal. Mild mitral valve regurgitation. Severe  mitral stenosis.  6. The aortic valve is grossly normal. Aortic valve regurgitation is  trivial.  7. Agitated saline contrast bubble study was negative, with no evidence  of any interatrial shunt.

## 2019-12-30 NOTE — Anesthesia Preprocedure Evaluation (Addendum)
Anesthesia Evaluation  Patient identified by MRN, date of birth, ID band Patient awake    Reviewed: Allergy & Precautions, NPO status , Patient's Chart, lab work & pertinent test results  Airway Mallampati: II  TM Distance: >3 FB Neck ROM: Full    Dental  (+) Teeth Intact, Dental Advisory Given   Pulmonary Current Smoker and Patient abstained from smoking.,    breath sounds clear to auscultation       Cardiovascular hypertension,  Rhythm:Regular Rate:Normal     Neuro/Psych    GI/Hepatic   Endo/Other    Renal/GU      Musculoskeletal   Abdominal   Peds  Hematology   Anesthesia Other Findings   Reproductive/Obstetrics                           Anesthesia Physical Anesthesia Plan  ASA: III  Anesthesia Plan: General   Post-op Pain Management:    Induction: Intravenous  PONV Risk Score and Plan: Ondansetron and Dexamethasone  Airway Management Planned: Nasal ETT  Additional Equipment:   Intra-op Plan:   Post-operative Plan: Extubation in OR  Informed Consent: I have reviewed the patients History and Physical, chart, labs and discussed the procedure including the risks, benefits and alternatives for the proposed anesthesia with the patient or authorized representative who has indicated his/her understanding and acceptance.       Plan Discussed with: CRNA and Anesthesiologist  Anesthesia Plan Comments: (Follows with cardiology for hx of Rheumatic mitral valve disease,NSVT,severe mitral stenosis and moderate mitral regurgitation, mild aortic stenosis, hypertension . Requires dental extractions in preparation for Mitral valve replacement for stage D severe symptomatic mitral stenosis and mild to moderate mitral regurgitation.  Preop labs reviewed, unremarkable.   EKG 12/10/19: Sinus rhythm with premature atrial complexes.  Possible LAE.  Rate 85.  LVH with repol  abnormality.  R/L cath 12/16/19: Conclusions: 1. Two vessel coronary artery disease with 70% stenosis involving ostium of OM3. There is mild disease involving the LAD and distal LCx. 2. Severe mitral stenosis (mean gradient 10 mmHg, valve area 1.3 cm^2). 3. Moderately elevated PCWP and normal LVEDP, consistent with mitral stenosis. 4. Mildly to moderately elevated right heart and pulmonary artery pressures. 5. Normal Fick cardiac output/index.  Recommendations: 1. Ongoing evaluation and management of rheumatic heart disease with severe mitral stenosis per Dr. Duke Salvia. 2. Start aspirin 81 mg daily. 3. Optimize medical therapy and risk factor modification for coronary artery disease. Consider addition of statin therapy when the patient is seen for follow-up. Defer PCI to [facility] pending further workup of valvular heart disease.  TEE 12/16/19: 1. Left ventricular ejection fraction, by estimation, is 60 to 65%. The  left ventricle has normal function. The left ventricle has no regional  wall motion abnormalities.  2. Right ventricular systolic function is normal. The right ventricular  size is normal. moderately increased right ventricular wall thickness.  3. Left atrial size was severely dilated. No left atrial/left atrial  appendage thrombus was detected.  4. Right atrial size was mildly dilated.  5. Severe, rheumatic mitral valve stenosis. Commisural fusion. Diastolic  doming particularly of anterior mitral leaflet. Moderate leaflet  calcifications. Mild calcification of the submitral apparatus, somewhat  obscured by shadowing. Variable cycle  length from ectopy impacts quantitation. 3D planimetry MVA = 1 cm2. MVA by  PHT with regular R-R = 1.50 cm2. MVA by PISA using a radius of 1 cm and  angle of 120 deg = 0.9  cm2. MVA by continuity equation = 1.29 cm2. Mean  gradient 6 mmHg at HR of 71 bpm.  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  The mitral  valve is abnormal. Mild mitral valve regurgitation. Severe  mitral stenosis.  6. The aortic valve is grossly normal. Aortic valve regurgitation is  trivial.  7. Agitated saline contrast bubble study was negative, with no evidence  of any interatrial shunt. )       Anesthesia Quick Evaluation

## 2019-12-31 ENCOUNTER — Other Ambulatory Visit: Payer: Self-pay

## 2019-12-31 ENCOUNTER — Ambulatory Visit (HOSPITAL_COMMUNITY)
Admission: RE | Admit: 2019-12-31 | Discharge: 2019-12-31 | Disposition: A | Discharge status: Home / Self Care | Payer: No Typology Code available for payment source | Source: Ambulatory Visit | Attending: Dentistry | Admitting: Dentistry

## 2019-12-31 ENCOUNTER — Encounter (HOSPITAL_COMMUNITY)
Admission: RE | Disposition: A | Discharge status: Home / Self Care | Payer: Self-pay | Source: Ambulatory Visit | Attending: Dentistry

## 2019-12-31 ENCOUNTER — Ambulatory Visit (HOSPITAL_COMMUNITY): Payer: No Typology Code available for payment source | Admitting: Certified Registered"

## 2019-12-31 ENCOUNTER — Ambulatory Visit (HOSPITAL_COMMUNITY): Payer: No Typology Code available for payment source | Admitting: Physician Assistant

## 2019-12-31 DIAGNOSIS — Z7982 Long term (current) use of aspirin: Secondary | ICD-10-CM | POA: Diagnosis not present

## 2019-12-31 DIAGNOSIS — F17210 Nicotine dependence, cigarettes, uncomplicated: Secondary | ICD-10-CM | POA: Insufficient documentation

## 2019-12-31 DIAGNOSIS — Z79899 Other long term (current) drug therapy: Secondary | ICD-10-CM | POA: Diagnosis not present

## 2019-12-31 DIAGNOSIS — Z8249 Family history of ischemic heart disease and other diseases of the circulatory system: Secondary | ICD-10-CM | POA: Diagnosis not present

## 2019-12-31 DIAGNOSIS — I2510 Atherosclerotic heart disease of native coronary artery without angina pectoris: Secondary | ICD-10-CM | POA: Insufficient documentation

## 2019-12-31 DIAGNOSIS — K0530 Chronic periodontitis, unspecified: Secondary | ICD-10-CM

## 2019-12-31 DIAGNOSIS — K0889 Other specified disorders of teeth and supporting structures: Secondary | ICD-10-CM

## 2019-12-31 DIAGNOSIS — K045 Chronic apical periodontitis: Secondary | ICD-10-CM | POA: Diagnosis not present

## 2019-12-31 DIAGNOSIS — I052 Rheumatic mitral stenosis with insufficiency: Secondary | ICD-10-CM | POA: Diagnosis not present

## 2019-12-31 DIAGNOSIS — M278 Other specified diseases of jaws: Secondary | ICD-10-CM

## 2019-12-31 DIAGNOSIS — I119 Hypertensive heart disease without heart failure: Secondary | ICD-10-CM | POA: Diagnosis not present

## 2019-12-31 DIAGNOSIS — M270 Developmental disorders of jaws: Secondary | ICD-10-CM

## 2019-12-31 HISTORY — PX: MULTIPLE EXTRACTIONS WITH ALVEOLOPLASTY: SHX5342

## 2019-12-31 SURGERY — MULTIPLE EXTRACTION WITH ALVEOLOPLASTY
Anesthesia: General

## 2019-12-31 MED ORDER — ROCURONIUM BROMIDE 10 MG/ML (PF) SYRINGE
PREFILLED_SYRINGE | INTRAVENOUS | Status: AC
  Filled 2019-12-31: qty 10

## 2019-12-31 MED ORDER — ESMOLOL HCL 100 MG/10ML IV SOLN
INTRAVENOUS | Status: AC
  Filled 2019-12-31: qty 10

## 2019-12-31 MED ORDER — PROPOFOL 10 MG/ML IV BOLUS
INTRAVENOUS | Status: DC | PRN
  Administered 2019-12-31: 170 mg via INTRAVENOUS

## 2019-12-31 MED ORDER — EPHEDRINE 5 MG/ML INJ
INTRAVENOUS | Status: AC
  Filled 2019-12-31: qty 10

## 2019-12-31 MED ORDER — DEXAMETHASONE SODIUM PHOSPHATE 10 MG/ML IJ SOLN
INTRAMUSCULAR | Status: AC
  Filled 2019-12-31: qty 1

## 2019-12-31 MED ORDER — FENTANYL CITRATE (PF) 250 MCG/5ML IJ SOLN
INTRAMUSCULAR | Status: AC
  Filled 2019-12-31: qty 5

## 2019-12-31 MED ORDER — LIDOCAINE-EPINEPHRINE 2 %-1:100000 IJ SOLN
INTRAMUSCULAR | Status: AC
  Filled 2019-12-31: qty 5.1

## 2019-12-31 MED ORDER — FENTANYL CITRATE (PF) 250 MCG/5ML IJ SOLN
INTRAMUSCULAR | Status: DC | PRN
  Administered 2019-12-31: 100 ug via INTRAVENOUS
  Administered 2019-12-31: 50 ug via INTRAVENOUS

## 2019-12-31 MED ORDER — CEFAZOLIN SODIUM-DEXTROSE 2-4 GM/100ML-% IV SOLN
2.0000 g | Freq: Once | INTRAVENOUS | Status: AC
  Administered 2019-12-31: 08:00:00 2 g via INTRAVENOUS

## 2019-12-31 MED ORDER — LIDOCAINE-EPINEPHRINE 2 %-1:100000 IJ SOLN
INTRAMUSCULAR | Status: AC
  Filled 2019-12-31: qty 3.4

## 2019-12-31 MED ORDER — ONDANSETRON HCL 4 MG/2ML IJ SOLN
INTRAMUSCULAR | Status: DC | PRN
  Administered 2019-12-31: 4 mg via INTRAVENOUS

## 2019-12-31 MED ORDER — LIDOCAINE-EPINEPHRINE 2 %-1:100000 IJ SOLN
INTRAMUSCULAR | Status: AC
  Filled 2019-12-31: qty 10.2

## 2019-12-31 MED ORDER — AMINOCAPROIC ACID SOLUTION 5% (50 MG/ML)
10.0000 mL | ORAL | Status: DC
  Filled 2019-12-31: qty 100

## 2019-12-31 MED ORDER — PHENYLEPHRINE 40 MCG/ML (10ML) SYRINGE FOR IV PUSH (FOR BLOOD PRESSURE SUPPORT)
PREFILLED_SYRINGE | INTRAVENOUS | Status: AC
  Filled 2019-12-31: qty 10

## 2019-12-31 MED ORDER — PROPOFOL 10 MG/ML IV BOLUS
INTRAVENOUS | Status: AC
  Filled 2019-12-31: qty 20

## 2019-12-31 MED ORDER — LIDOCAINE 2% (20 MG/ML) 5 ML SYRINGE
INTRAMUSCULAR | Status: DC | PRN
  Administered 2019-12-31: 20 mg via INTRAVENOUS

## 2019-12-31 MED ORDER — LIDOCAINE-EPINEPHRINE 2 %-1:100000 IJ SOLN
INTRAMUSCULAR | Status: DC | PRN
  Administered 2019-12-31: 13.6 mL via INTRADERMAL

## 2019-12-31 MED ORDER — BUPIVACAINE-EPINEPHRINE 0.5% -1:200000 IJ SOLN
INTRAMUSCULAR | Status: DC | PRN
  Administered 2019-12-31: 3.6 mL

## 2019-12-31 MED ORDER — DEXAMETHASONE SODIUM PHOSPHATE 10 MG/ML IJ SOLN
INTRAMUSCULAR | Status: DC | PRN
  Administered 2019-12-31: 10 mg via INTRAVENOUS

## 2019-12-31 MED ORDER — SUGAMMADEX SODIUM 200 MG/2ML IV SOLN
INTRAVENOUS | Status: DC | PRN
  Administered 2019-12-31: 200 mg via INTRAVENOUS

## 2019-12-31 MED ORDER — ONDANSETRON HCL 4 MG/2ML IJ SOLN
INTRAMUSCULAR | Status: AC
  Filled 2019-12-31: qty 2

## 2019-12-31 MED ORDER — ESMOLOL HCL 100 MG/10ML IV SOLN
INTRAVENOUS | Status: DC | PRN
  Administered 2019-12-31: 5 mg via INTRAVENOUS

## 2019-12-31 MED ORDER — MIDAZOLAM HCL 2 MG/2ML IJ SOLN
INTRAMUSCULAR | Status: AC
  Filled 2019-12-31: qty 2

## 2019-12-31 MED ORDER — BUPIVACAINE-EPINEPHRINE (PF) 0.5% -1:200000 IJ SOLN
INTRAMUSCULAR | Status: AC
  Filled 2019-12-31: qty 3.6

## 2019-12-31 MED ORDER — SUCCINYLCHOLINE CHLORIDE 200 MG/10ML IV SOSY
PREFILLED_SYRINGE | INTRAVENOUS | Status: AC
  Filled 2019-12-31: qty 10

## 2019-12-31 MED ORDER — ONDANSETRON HCL 4 MG/2ML IJ SOLN: 4.0000 mg | Freq: Once | INTRAMUSCULAR | Status: DC | PRN

## 2019-12-31 MED ORDER — AMINOCAPROIC ACID SOLUTION 5% (50 MG/ML)
ORAL | Status: DC | PRN
  Administered 2019-12-31: 1 mL via ORAL

## 2019-12-31 MED ORDER — PHENYLEPHRINE HCL-NACL 10-0.9 MG/250ML-% IV SOLN
INTRAVENOUS | Status: DC | PRN
  Administered 2019-12-31: 25 ug/min via INTRAVENOUS

## 2019-12-31 MED ORDER — ROCURONIUM BROMIDE 10 MG/ML (PF) SYRINGE
PREFILLED_SYRINGE | INTRAVENOUS | Status: DC | PRN
  Administered 2019-12-31: 60 mg via INTRAVENOUS

## 2019-12-31 MED ORDER — OXYCODONE HCL 5 MG PO TABS: 5.0000 mg | ORAL_TABLET | Freq: Once | ORAL | Status: DC | PRN

## 2019-12-31 MED ORDER — FENTANYL CITRATE (PF) 100 MCG/2ML IJ SOLN: 25.0000 ug | INTRAMUSCULAR | Status: DC | PRN

## 2019-12-31 MED ORDER — 0.9 % SODIUM CHLORIDE (POUR BTL) OPTIME
TOPICAL | Status: DC | PRN
  Administered 2019-12-31: 1000 mL

## 2019-12-31 MED ORDER — MIDAZOLAM HCL 5 MG/5ML IJ SOLN
INTRAMUSCULAR | Status: DC | PRN
  Administered 2019-12-31: 2 mg via INTRAVENOUS

## 2019-12-31 MED ORDER — LACTATED RINGERS IV SOLN: INTRAVENOUS | Status: DC | PRN

## 2019-12-31 MED ORDER — LIDOCAINE 2% (20 MG/ML) 5 ML SYRINGE
INTRAMUSCULAR | Status: AC
  Filled 2019-12-31: qty 5

## 2019-12-31 MED ORDER — OXYCODONE HCL 5 MG/5ML PO SOLN: 5.0000 mg | Freq: Once | ORAL | Status: DC | PRN

## 2019-12-31 SURGICAL SUPPLY — 40 items
ALCOHOL 70% 16 OZ (MISCELLANEOUS) ×2 IMPLANT
ATTRACTOMAT 16X20 MAGNETIC DRP (DRAPES) ×2 IMPLANT
BANDAGE HEMOSTAT MRDH 4X4 STRL (MISCELLANEOUS) IMPLANT
BLADE SURG 15 STRL LF DISP TIS (BLADE) ×2 IMPLANT
BLADE SURG 15 STRL SS (BLADE) ×4
BNDG HEMOSTAT MRDH 4X4 STRL (MISCELLANEOUS)
COVER SURGICAL LIGHT HANDLE (MISCELLANEOUS) ×2 IMPLANT
COVER WAND RF STERILE (DRAPES) ×2 IMPLANT
GAUZE 4X4 16PLY RFD (DISPOSABLE) ×2 IMPLANT
GAUZE PACKING FOLDED 2  STR (GAUZE/BANDAGES/DRESSINGS) ×2
GAUZE PACKING FOLDED 2 STR (GAUZE/BANDAGES/DRESSINGS) ×1 IMPLANT
GLOVE BIO SURGEON STRL SZ 6.5 (GLOVE) ×2 IMPLANT
GLOVE SURG ORTHO 8.0 STRL STRW (GLOVE) ×2 IMPLANT
GOWN STRL REUS W/ TWL LRG LVL3 (GOWN DISPOSABLE) ×1 IMPLANT
GOWN STRL REUS W/TWL 2XL LVL3 (GOWN DISPOSABLE) ×2 IMPLANT
GOWN STRL REUS W/TWL LRG LVL3 (GOWN DISPOSABLE) ×2
HEMOSTAT SURGICEL 2X14 (HEMOSTASIS) IMPLANT
KIT BASIN OR (CUSTOM PROCEDURE TRAY) ×2 IMPLANT
KIT TURNOVER KIT B (KITS) ×2 IMPLANT
MANIFOLD NEPTUNE II (INSTRUMENTS) ×2 IMPLANT
NDL BLUNT 16X1.5 OR ONLY (NEEDLE) IMPLANT
NDL DENTAL 27 LONG (NEEDLE) IMPLANT
NEEDLE BLUNT 16X1.5 OR ONLY (NEEDLE) ×2 IMPLANT
NEEDLE DENTAL 27 LONG (NEEDLE) ×4 IMPLANT
NS IRRIG 1000ML POUR BTL (IV SOLUTION) ×2 IMPLANT
PACK EENT II TURBAN DRAPE (CUSTOM PROCEDURE TRAY) ×2 IMPLANT
PAD ARMBOARD 7.5X6 YLW CONV (MISCELLANEOUS) ×1 IMPLANT
SPONGE SURGIFOAM ABS GEL 100 (HEMOSTASIS) IMPLANT
SPONGE SURGIFOAM ABS GEL 12-7 (HEMOSTASIS) IMPLANT
SPONGE SURGIFOAM ABS GEL SZ50 (HEMOSTASIS) IMPLANT
SUCTION FRAZIER HANDLE 10FR (MISCELLANEOUS) ×2
SUCTION TUBE FRAZIER 10FR DISP (MISCELLANEOUS) ×1 IMPLANT
SUT CHROMIC 3 0 PS 2 (SUTURE) ×6 IMPLANT
SUT CHROMIC 4 0 P 3 18 (SUTURE) IMPLANT
SYR 50ML SLIP (SYRINGE) ×2 IMPLANT
TOWEL GREEN STERILE (TOWEL DISPOSABLE) ×2 IMPLANT
TUBE CONNECTING 12X1/4 (SUCTIONS) ×2 IMPLANT
WATER STERILE IRR 1000ML POUR (IV SOLUTION) ×2 IMPLANT
WATER TABLETS ICX (MISCELLANEOUS) ×2 IMPLANT
YANKAUER SUCT BULB TIP NO VENT (SUCTIONS) ×2 IMPLANT

## 2019-12-31 NOTE — Op Note (Signed)
OPERATIVE REPORT  Patient:            Melvin Burns. Rosensteel Date of Birth:  Oct 26, 1965 MRN:                [id number]   DATE OF PROCEDURE:  12/31/2019  PREOPERATIVE DIAGNOSES: 1.   Severe mitral stenosis 2.  Preheart valve surgery dental protocol 3.  Chronic apical periodontitis 4.  Loose teeth 5.  Bilateral mandibular lingual tori 6.  Lateral exostoses  POSTOPERATIVE DIAGNOSES: 1.   Severe mitral stenosis 2.  Preheart valve surgery dental protocol 3.  Chronic apical periodontitis 4.  Loose teeth 5.  Bilateral mandibular lingual tori 6.  Lateral exostoses   OPERATIONS: 1. Multiple extraction of tooth numbers 16, 17, 20, 21, 22, 23, 28, 29, and 30 2. 4 Quadrants of alveoloplasty 3.  Bilateral mandibular lingual tori reductions 4.  Bilateral maxillary buccal exostosis reduction 5.  Mandibular right lingual exostoses reductions  SURGEON: Charlynne Pander, DDS  ASSISTANT: Pearletha Alfred (dental assistant)  ANESTHESIA: General anesthesia via nasoendotracheal tube.  MEDICATIONS: 1. Ancef 2 g IV prior to invasive dental procedures. 2. Local anesthesia with a total utilization of 8 carpules each containing 34 mg of lidocaine with 0.017 mg of epinephrine as well as 2 carpules each containing 9 mg of bupivacaine with 0.009 mg of epinephrine.  SPECIMENS: There are 9 teeth that were discarded.  DRAINS: None  CULTURES: None  COMPLICATIONS: None  ESTIMATED BLOOD LOSS: 100 mLs.  INTRAVENOUS FLUIDS: 1000 mLs of Lactated ringers solution.  INDICATIONS: The patient was recently diagnosed with severe mitral stenosis.  A medically necessary dental consultation was then requested to evaluate poor dentition.  The patient was examined and treatment planned for multiple extractions with alveoloplasty and preprosthetic surgery as needed.  This treatment plan was formulated to decrease the risks and complications associated with dental infection from affecting the patient's  systemic health and the anticipated heart valve surgery.  OPERATIVE FINDINGS: Patient was examined operating room number 9.  The teeth were identified for extraction. The patient was noted be affected by chronic apical periodontitis, chronic periodontitis, loose teeth, bilateral mandibular lingual tori, and lateral exostoses.   DESCRIPTION OF PROCEDURE: Patient was brought to the main operating room number 9. Patient was then placed in the supine position on the operating table.  General anesthesia was then induced per the anesthesia team. The patient was then prepped and draped in the usual manner for dental medicine procedure. A timeout was performed. The patient was identified and procedures were verified. A throat pack was placed at this time. The oral cavity was then thoroughly examined with the findings noted above. The patient was then ready for dental medicine procedure as follows:  Local anesthesia was then administered sequentially with a total utilization of 8 carpules each containing 34 mg of lidocaine with 0.017 mg of epinephrine as well as 2 carpules  each containing 9 mg bupivacaine with 0.009 mg of epinephrine.  The Maxillary left and right quadrants first approached. Anesthesia was then delivered utilizing infiltration with lidocaine with epinephrine. A #15 blade incision was then made from the maxillary left tuberosity and extended to the mesial of #11.  A  surgical flap was then carefully reflected.  Tooth #16 was then removed with a 150 forceps without complications.  At this point time the flap was reflected to expose the buccal exostoses.  These buccal exostoses were then reduced utilizing a rongeurs and bone file.  Alveoloplasty was then performed utilizing  a rongeurs and bone file to help achieve primary closure.  The tissues were approximated and trimmed appropriately.  The surgical site was then irrigated with copious amounts sterile saline.  The maxillary left surgical site was  then closed to the maxillary left tuberosity and extended to the mesial #11 utilizing 3-0 Chromic Gut suture in a continuous erupted suture technique x1.    The maxillary right quadrant was then approached.  A 15 blade incision was made from the maxillary right tuberosity and extended to the mesial of #6.  A surgical flap was then reflected to expose the maxillary right lateral exostoses.  The lateral exostoses were then reduced utilizing rongeurs and bone file.  Further alveoloplasty was then performed utilizing rongeurs and bone file to help achieve primary closure.  The tissues were then approximated and trimmed appropriately.  The surgical site was then irrigated with copious amounts sterile saline.  Surgical site was then closed on the maxillary right tuberosity and extended to the mesial #6 utilizing 3-0 Chromic Gut suture in a continuous erupted suture technique x1.   At this point in time, the mandibular quadrants were approached. The patient was given bilateral inferior alveolar nerve blocks and long buccal nerve blocks utilizing the bupivacaine with epinephrine. Further infiltration was then achieved utilizing the lidocaine with epinephrine. A 15 blade incision was then made from the distal of number 17 extended to the distal of #32.  A surgical flap was then carefully reflected.  The mandibular teeth were then subluxated with a series straight elevators.  Tooth numbers 17, 20, 21, 22, 23, 28, 29, and 30 were then removed utilizing a 151 forceps without complications.  Alveoloplasty was performed with a rongeurs and bone file.  The lingual flaps were then further reflected to expose the bilateral mandibular lingual tori as well as the mandibular right lingual exostoses.  The bilateral mandibular lingual tori were then reduced utilizing a surgical handpiece and bur and copious amounts sterile water.  The mandibular right lingual exostoses were then reduced utilizing a surgical handpiece and bur and  copious amounts sterile water.  Alveoloplasty was then performed utilizing rongeurs and bone file to help achieve primary closure.  The tissues were approximated and trimmed appropriately.  The mandibular left surgical site was then closed from the distal of  #17 and extended to the mesial of #24 utilizing 3-0 Chromic Gut suture in a continuous interruped suture technique x1.  The mandibular right surgical site was then closed from the distal #32 and extended the mesial #25 utilizing 3-0 Chromic Gut suture in a continuous interrupted suture technique x1.  Two individual interrupted sutures were then placed further close surgical site utilizing 3-0 Chromic Gut material.    At this point time, the entire mouth was irrigated with copious amounts of sterile saline. The patient was examined for complications, seeing none, the dental medicine procedure was deemed to be complete. The throat pack was removed at this time. An oral airway was then placed at the request of the anesthesia team. A series of 4 x 4 gauze moistened with Amicar 5% rinse were placed in the mouth to aid hemostasis. The patient was then handed over to the anesthesia team for final disposition. After an appropriate amount of time, the patient was extubated and taken to the postanesthsia care unit in good condition. All counts were correct for the dental medicine procedure.  The patient is to continue Amicar 5% rinses postoperatively.  Patient is to rinse with 10 mls every  hour for the next 10 hours in a swish and spit manner.  Patient is to use a combination of ibuprofen 600 mg every 6 hours for 24 hours and then as needed for moderate to severe pain along with acetaminophen 650 mg every 6 hours as needed for moderate to severe dental pain.    [name], DDS.

## 2019-12-31 NOTE — Progress Notes (Signed)
PRE-OPERATIVE NOTE:  12/31/2019 Melvin Burns [id number]  VITALS: BP (!) 142/83   Pulse 82   Temp 98.5 F (36.9 C) (Oral)   Resp 18   Ht 5\' 9"  (1.753 m)   Wt 81.5 kg   SpO2 99%   BMI 26.52 kg/m   Lab Results  Component Value Date   WBC 9.1 12/27/2019   HGB 14.1 12/27/2019   HCT 44.9 12/27/2019   MCV 89.8 12/27/2019   PLT 208 12/27/2019   BMET    Component Value Date/Time   NA 139 12/27/2019 1155   NA 136 12/10/2019 1621   K 3.6 12/27/2019 1155   CL 100 12/27/2019 1155   CO2 30 12/27/2019 1155   GLUCOSE 106 (H) 12/27/2019 1155   BUN 8 12/27/2019 1155   BUN 11 12/10/2019 1621   CREATININE 0.94 12/27/2019 1155   CREATININE 0.99 06/01/2016 1741   CALCIUM 10.0 12/27/2019 1155   GFRNONAA >60 12/27/2019 1155   GFRNONAA 88 06/01/2016 1741   GFRAA >60 12/27/2019 1155   GFRAA >89 06/01/2016 1741    No results found for: INR, PROTIME No results found for: PTT   Melvin Burns presents for multiple dental extractions with alveoloplasty and preprosthetic surgery as needed in the operating room with general anesthesia.     SUBJECTIVE: The patient denies any acute medical or dental changes and agrees to proceed with treatment as planned.  EXAM: No sign of acute dental changes.  ASSESSMENT: Patient is affected by chronic apical periodontitis, chronic periodontitis, loose teeth, bilateral mandibular lingual tori, multiple lateral exostoses.  PLAN: Patient agrees to proceed with treatment as planned in the operating room as previously discussed and accepts the risks, benefits, and complications of the proposed treatment. Patient is aware of the risk for bleeding, bruising, swelling, infection, pain, nerve damage, soft tissue damage, sinus involvement, root tip fracture, mandible fracture, and the risks of complications associated with the anesthesia. Patient also is aware of the potential for other complications up to and including death due to her overall  cardiovascular and medical compromise.    06/03/2016, DDS

## 2019-12-31 NOTE — Anesthesia Postprocedure Evaluation (Signed)
Anesthesia Post Note  Patient: Melvin Burns. Zayed  Procedure(s) Performed: Extraction of tooth #'s 16-17, 20-23, and 28-30 with alveoloplasty, Bilateral mandibular tori reductions, and lateral exostoses reductions. (N/A )     Patient location during evaluation: PACU Anesthesia Type: General Level of consciousness: awake and alert Pain management: pain level controlled Vital Signs Assessment: post-procedure vital signs reviewed and stable Respiratory status: spontaneous breathing, nonlabored ventilation, respiratory function stable and patient connected to nasal cannula oxygen Cardiovascular status: blood pressure returned to baseline and stable Postop Assessment: no apparent nausea or vomiting Anesthetic complications: no    Last Vitals:  Vitals:   12/31/19 1010 12/31/19 1015  BP: (!) 150/90 (!) 148/89  Pulse: 76 80  Resp: 12 (!) 24  Temp: (!) 36.1 C   SpO2: 98% 97%    Last Pain:  Vitals:   12/31/19 0612  TempSrc:   PainSc: 0-No pain                 Dontarious Schaum COKER

## 2019-12-31 NOTE — Anesthesia Procedure Notes (Signed)
Procedure Name: Intubation Date/Time: 12/31/2019 7:40 AM Performed by: Myna Bright, CRNA Pre-anesthesia Checklist: Patient identified, Emergency Drugs available, Suction available and Patient being monitored Patient Re-evaluated:Patient Re-evaluated prior to induction Oxygen Delivery Method: Circle system utilized Preoxygenation: Pre-oxygenation with 100% oxygen Induction Type: IV induction Ventilation: Mask ventilation without difficulty Laryngoscope Size: Mac and 4 Grade View: Grade I Nasal Tubes: Right and Nasal prep performed Tube size: 7.0 mm Number of attempts: 1 Placement Confirmation: ETT inserted through vocal cords under direct vision,  positive ETCO2 and breath sounds checked- equal and bilateral Tube secured with: Tape Dental Injury: Teeth and Oropharynx as per pre-operative assessment

## 2019-12-31 NOTE — Transfer of Care (Signed)
Immediate Anesthesia Transfer of Care Note  Patient: Melvin Burns. Barbuto  Procedure(s) Performed: Extraction of tooth #'s 16-17, 20-23, and 28-30 with alveoloplasty, Bilateral mandibular tori reductions, and lateral exostoses reductions. (N/A )  Patient Location: PACU  Anesthesia Type:General  Level of Consciousness: awake, alert , oriented and patient cooperative  Airway & Oxygen Therapy: Patient Spontanous Breathing and Patient connected to face mask oxygen  Post-op Assessment: Report given to RN, Post -op Vital signs reviewed and stable and Patient moving all extremities  Post vital signs: Reviewed and stable  Last Vitals:  Vitals Value Taken Time  BP 150/88 12/31/19 0942  Temp 36.1 C 12/31/19 0942  Pulse 79 12/31/19 0945  Resp 15 12/31/19 0945  SpO2 100 % 12/31/19 0945  Vitals shown include unvalidated device data.  Last Pain:  Vitals:   12/31/19 0612  TempSrc:   PainSc: 0-No pain      Patients Stated Pain Goal: 3 (12/31/19 0612)  Complications: No apparent anesthesia complications

## 2019-12-31 NOTE — Discharge Instructions (Signed)

## 2019-12-31 NOTE — H&P (Signed)
12/31/2019  Patient:            Melvin Burns. Connell Date of Birth:  1966-09-14 MRN:                [id number]   BP (!) 142/83   Pulse 82   Temp 98.5 F (36.9 C) (Oral)   Resp 18   Ht 5\' 9"  (1.753 m)   Wt 81.5 kg   SpO2 99%   BMI 26.52 kg/m    Melvin Burns is a 54 year old male with severe mitral stenosis and anticipated mitral valve replacement.  Patient was seen for evaluation of poor dentition.  Patient is now scheduled for multiple dental extractions with alveoloplasty and preprosthetic surgery as needed in the operating room with general anesthesia.  Patient denies any acute medical or dental changes.  Please see note from Dr. Roxy Manns dated 12/23/2019 to act as the H&P for the dental operating room procedure.    [name], DDS    CARDIOTHORACIC SURGERY CONSULTATION REPORT     Referring Provider is Skeet Latch, MD  PCP is Tresa Garter, MD          Chief Complaint    Patient presents with    .   Mitral Regurgitation            Surgical consultation    .   Mitral Stenosis          HPI:     Patient is a 54 year old male originally from [place] with history of rheumatic mitral valve disease and hypertension who has been referred for surgical consultation to discuss treatment options for management of severe symptomatic mitral stenosis with mitral regurgitation.     Patient states that he does not have any documented history of rheumatic fever although he recalls being very ill with bronchial infection during his teenage years.  Several years ago he was noted to have an abnormal EKG and he was referred for cardiology consultation in 2017.  He was noted to have a heart murmur on physical exam and echocardiogram at that time revealed likely rheumatic heart disease with severe mitral stenosis, moderate mitral regurgitation, and normal left ventricular systolic function.  The patient denied any symptoms at that  time and remained quite active physically.  In October 2020 he was treated for pneumonia and since then he has noted the development of exertional shortness of breath.  He was referred for stress echocardiogram although transvalvular gradient was not measured during stress during the procedure.  The patient did develop chest discomfort and short runs of ventricular tachycardia during stress.  He was seen in follow-up recently by Dr. Oval Linsey who felt that further diagnostic work-up to consider elective mitral valve replacement was warranted.  TEE and diagnostic cardiac catheterization were performed December 16, 2019.  TEE revealed normal left ventricular function with ejection fraction estimated 60 to 65%.  There was severe rheumatic mitral valve stenosis with commissural fusion, diastolic doming, moderate leaflet calcification, and mild calcification of the subvalvular apparatus.  By planimetry of the mitral valve area measured just under 1.0 cm.  There was mild mitral regurgitation.  The aortic valve appeared normal.  Right ventricular size and function was normal.  There was no significant tricuspid regurgitation.  Diagnostic cardiac catheterization confirmed the presence of severe mitral stenosis with mean transvalvular gradient measured 10 mmHg corresponding to aortic valve area calculated 1.3 cm.  There was moderately elevated pulmonary capillary wedge pressure and mild to moderately  elevated right heart pressures with normal cardiac output.  There was two-vessel coronary artery disease with 70% stenosis involving ostial portion of the third obtuse marginal branch with otherwise only mild nonobstructive disease in the remaining coronary circulation.  Cardiothoracic surgical consultation was requested.     Patient is married and lives locally in [place] with his wife.  He moved from [place] to [place] in 1998.  He works full-time as a Estate agent.  He has remained physically active all  of his life and up until recently has enjoyed exercise on a regular basis.  He states that over the past 6 months he has developed exertional shortness of breath that only occurs with more strenuous exertion, such as going up a flight of stairs.  He denies resting shortness of breath, PND, orthopnea, or lower extremity edema.  He does report some mild vague discomfort across his chest which seems to occur primarily when he is laying flat.  He reports occasional palpitations without dizzy spells or syncope.          Past Medical History:    Diagnosis   Date    .   Hypertension        .   Mild aortic stenosis        .   Mitral valve stenosis, severe        .   Moderate mitral regurgitation        .   Rheumatic mitral stenosis with regurgitation            Echo 08/08/16:  Severe mitral stenosis (mean gradient 17 mmHg).  Moderate mitral regurgitation.                Past Surgical History:    Procedure   Laterality   Date    .   BILATERAL KNEE ARTHROSCOPY            .   BUBBLE STUDY       12/16/2019        Procedure: BUBBLE STUDY;  Surgeon: [name], MD;  Location: [facility] ENDOSCOPY;  Service: Cardiology;;    .   RIGHT/LEFT HEART CATH AND CORONARY ANGIOGRAPHY   N/A   12/16/2019        Procedure: RIGHT/LEFT HEART CATH AND CORONARY ANGIOGRAPHY;  Surgeon: Yvonne Kendall, MD;  Location: [facility] INVASIVE CV LAB;  Service: Cardiovascular;  Laterality: N/A;    .   TEE WITHOUT CARDIOVERSION   N/A   12/16/2019        Procedure: TRANSESOPHAGEAL ECHOCARDIOGRAM (TEE);  Surgeon: [name], MD;  Location: [facility] ENDOSCOPY;  Service: Cardiology;  Laterality: N/A;                Family History    Problem   Relation   Age of Onset    .   Hypertension   Mother        .   Hypertension   Father        .   Stroke   Father               Social History              Socioeconomic History    .   Marital status:   Married            Spouse name:   Not on file    .   Number of children:   Not on file    .  Years of education:   Not on file    .   Highest education level:   Not on file    Occupational History    .   Occupation:   [employer] account executive    Tobacco Use    .   Smoking status:   Current Every Day Smoker            Packs/day:   1.00            Types:   Cigarettes    .   Smokeless tobacco:   Never Used    Substance and Sexual Activity    .   Alcohol use:   No    .   Drug use:   No    .   Sexual activity:   Yes            Partners:   Female    Other Topics   Concern    .   Not on file    Social History Narrative        Pt is married.  He is a former Database administrator.                                    Social Determinants of Health           Financial Resource Strain:     .   Difficulty of Paying Living Expenses:     Food Insecurity:     .   Worried About Programme researcher, broadcasting/film/video in the Last Year:     .   Barista in the Last Year:     Transportation Needs:     .   Freight forwarder (Medical):     Marland Kitchen   Lack of Transportation (Non-Medical):     Physical Activity:     .   Days of Exercise per Week:     .   Minutes of Exercise per Session:     Stress:     .   Feeling of Stress :     Social Connections:     .   Frequency of Communication with Friends and Family:     .   Frequency of Social Gatherings with Friends and Family:     .   Attends Religious Services:     .   Active Member of Clubs or Organizations:     .   Attends Banker Meetings:     Marland Kitchen   Marital Status:     Intimate Partner Violence:     .   Fear of Current or Ex-Partner:     .   Emotionally Abused:     Marland Kitchen   Physically Abused:     .    Sexually Abused:                  Current Outpatient Medications    Medication   Sig   Dispense   Refill    .   amLODipine (NORVASC) 10 MG tablet   TAKE 1 TABLET(10 MG) BY MOUTH DAILY (Patient taking differently: Take 10 mg by mouth daily. )   90 tablet   2    .   aspirin EC 81 MG tablet   Take 1 tablet (81 mg total) by mouth daily.            Marland Kitchen  chlorthalidone (HYGROTON) 25 MG tablet   TAKE 1 TABLET(25 MG) BY MOUTH DAILY (Patient taking differently: Take 25 mg by mouth daily. )   90 tablet   2    .   ibuprofen (ADVIL) 200 MG tablet   Take 400 mg by mouth every 6 (six) hours as needed for moderate pain.             Marland Kitchen   lisinopril (ZESTRIL) 10 MG tablet   TAKE 1 TABLET(10 MG) BY MOUTH DAILY (Patient taking differently: Take 10 mg by mouth daily. )   90 tablet   1        No current facility-administered medications for this visit.          No Known Allergies           Review of Systems:                 General:                      normal appetite, normal energy, no weight gain, no weight loss, no fever              Cardiac:                       no chest pain with exertion, occasional chest pain at rest, +SOB with exertion, NO resting SOB, NO PND, NO orthopnea, + palpitations, no arrhythmia, no atrial fibrillation, no LE edema, no dizzy spells, no syncope              Respiratory:                 no shortness of breath, no home oxygen, no productive cough, no dry cough, no bronchitis, no wheezing, no hemoptysis, no asthma, no pain with inspiration or cough, no sleep apnea, no CPAP at night              GI:                               no difficulty swallowing, no reflux, no frequent heartburn, no hiatal hernia, no abdominal pain, no constipation, no diarrhea, no hematochezia, no hematemesis, no melena              GU:                              no dysuria,  no frequency, no urinary tract infection, no  hematuria, no enlarged prostate, no kidney stones, no kidney disease              Vascular:                     no pain suggestive of claudication, no pain in feet, no leg cramps, no varicose veins, no DVT, no non-healing foot ulcer              Neuro:                         no stroke, no TIA's, no seizures, no headaches, no temporary blindness one eye,  no slurred speech, no peripheral neuropathy, no chronic pain, no instability of gait, no memory/cognitive dysfunction              Musculoskeletal:  no arthritis, no joint swelling, no myalgias, no difficulty walking, normal mobility               Skin:                            no rash, no itching, no skin infections, no pressure sores or ulcerations              Psych:                         no anxiety, no depression, no nervousness, no unusual recent stress              Eyes:                           no blurry vision, no floaters, no recent vision changes, + wears glasses or contacts              ENT:                            no hearing loss, + loose or painful teeth, no dentures, last saw dentist last fall who told him his teeth needed to be removed              Hematologic:               no easy bruising, no abnormal bleeding, no clotting disorder, no frequent epistaxis              Endocrine:                   no diabetes, does not check CBG's at home                               Physical Exam:                 BP 134/88 (BP Location: Right Arm, Patient Position: Sitting, Cuff Size: Large)   Pulse 80   Temp 97.7 F (36.5 C) (Temporal)   Resp 20   Ht 5\' 9"  (1.753 m)   Wt 180 lb 12.8 oz (82 kg)   SpO2 93% Comment: RA  BMI 26.70 kg/m               General:                      Thin,  well-appearing              HEENT:                       Unremarkable               Neck:                           no JVD, no bruits, no adenopathy               Chest:                          clear to auscultation,  symmetrical breath sounds, no wheezes, no rhonchi  CV:                              RRR, grade II/VI systolic murmur               Abdomen:                    soft, non-tender, no masses               Extremities:                 warm, well-perfused, pulses palpable, no LE edema              Rectal/GU                   Deferred              Neuro:                         Grossly non-focal and symmetrical throughout              Skin:                            Clean and dry, no rashes, no breakdown        Diagnostic Tests:       ECHOCARDIOGRAM REPORT         Patient Name:   Leary Roca Date of Exam: 08/14/2019  Medical Rec #:  [id number]           Height:       69.0 in  Accession #:    [id number]          Weight:       182.0 lb  Date of Birth:  Mar 06, 1966          BSA:          1.98 m  Patient Age:    53 years            BP:           115/76 mmHg  Patient Gender: M                   HR:           83 bpm.  Exam Location:  [address]   Procedure: 2D Echo, Cardiac Doppler and Color Doppler   Indications:    I05.0 Mitral valve stenosis; Z01.818 Pre-operative  clearance;                  I10 Hypertension;     History:        Patient has prior history of Echocardiogram examinations,  most                  recent 08/10/2018. Mild aortic stenosis.     Sonographer:    Cathie Beams RCS  Referring Phys: [id number] K KILROY   IMPRESSIONS     1. Left ventricular ejection fraction, by visual estimation, is 60 to  65%. The left ventricle has normal function. There is mildly increased  left ventricular hypertrophy.   2. Left ventricular diastolic parameters are indeterminate.   3. Global right ventricle has normal systolic function.The right  ventricular size is normal. No increase in right ventricular wall  thickness.   4. Left atrial size was normal.  5. Right atrial size was normal.   6. Severe calcification of the mitral valve leaflet(s).   7.  Severe thickening of the mitral valve leaflet(s).   8. Severely decreased mobility of the mitral valve leaflets.   9. The mitral valve is rheumatic. Trace mitral valve regurgitation.  Moderate-severe mitral stenosis.  10. The tricuspid valve is normal in structure. Tricuspid valve  regurgitation is trivial.  11. The aortic valve is tricuspid. Aortic valve regurgitation is not  visualized. moderate sclerosis.  12. Small gradient across aortic valve without stenosis.  13. The pulmonic valve was grossly normal. Pulmonic valve regurgitation is  trivial.  14. The inferior vena cava is normal in size with greater than 50%  respiratory variability, suggesting right atrial pressure of 3 mmHg.   FINDINGS   Left Ventricle: Left ventricular ejection fraction, by visual estimation,  is 60 to 65%. The left ventricle has normal function. There is mildly  increased left ventricular hypertrophy. Left ventricular diastolic  parameters are indeterminate. Normal left  atrial pressure.   Right Ventricle: The right ventricular size is normal. No increase in  right ventricular wall thickness. Global RV systolic function is has  normal systolic function.   Left Atrium: Left atrial size was normal in size.   Right Atrium: Right atrial size was normal in size   Pericardium: There is no evidence of pericardial effusion.   Mitral Valve: The mitral valve is rheumatic. There is severe thickening of  the mitral valve leaflet(s). There is severe calcification of the mitral  valve leaflet(s). Severely decreased mobility of the mitral valve  leaflets. Moderate-severe mitral valve  stenosis by observation. MV peak gradient, 17.0 mmHg. Trace mitral valve  regurgitation.   Tricuspid Valve: The tricuspid valve is normal in structure. Tricuspid  valve regurgitation is trivial.   Aortic Valve: The aortic valve is tricuspid. Aortic valve regurgitation is  not visualized. Moderate sclerosis. Aortic valve mean  gradient measures  8.0 mmHg. Aortic valve peak gradient measures 16.2 mmHg. Aortic valve  area, by VTI measures 2.26 cm. Small   gradient across aortic valve without stenosis.   Pulmonic Valve: The pulmonic valve was grossly normal. Pulmonic valve  regurgitation is trivial.   Aorta: The aortic root, ascending aorta and aortic arch are all  structurally normal, with no evidence of dilitation or obstruction.   Venous: The inferior vena cava is normal in size with greater than 50%  respiratory variability, suggesting right atrial pressure of 3 mmHg.   IAS/Shunts: No atrial level shunt detected by color flow Doppler. No  ventricular septal defect is seen or detected. There is no evidence of an  atrial septal defect.       LEFT VENTRICLE  PLAX 2D  LVIDd:         3.92 cm  LVIDs:         2.29 cm  LV PW:         1.18 cm  LV IVS:        1.26 cm  LVOT diam:     2.05 cm  LV SV:         49 ml  LV SV Index:   24.16  LVOT Area:     3.30 cm      RIGHT VENTRICLE  RV Basal diam:  2.99 cm  RV S prime:     11.77 cm/s  TAPSE (M-mode): 1.8 cm   LEFT ATRIUM             Index  RIGHT ATRIUM           Index  LA diam:        5.50 cm 2.77 cm/m  RA Area:     12.40 cm  LA Vol (A2C):   95.3 ml 48.01 ml/m RA Volume:   27.00 ml  13.60 ml/m  LA Vol (A4C):   88.1 ml 44.39 ml/m  LA Biplane Vol: 92.8 ml 46.75 ml/m   AORTIC VALVE  AV Area (Vmax):    2.12 cm  AV Area (Vmean):   2.09 cm  AV Area (VTI):     2.26 cm  AV Vmax:           201.00 cm/s  AV Vmean:          126.000 cm/s  AV VTI:            0.334 m  AV Peak Grad:      16.2 mmHg  AV Mean Grad:      8.0 mmHg  LVOT Vmax:         129.00 cm/s  LVOT Vmean:        79.800 cm/s  LVOT VTI:          0.229 m  LVOT/AV VTI ratio: 0.69     AORTA  Ao Root diam: 2.90 cm   MITRAL VALVE  MV Area (PHT): 1.40 cm              SHUNTS  MV Peak grad:  17.0 mmHg             Systemic VTI:  0.23 m  MV Mean grad:  8.0 mmHg              Systemic  Diam: 2.05 cm  MV Vmax:       2.06 m/s  MV Vmean:      133.0 cm/s  MV VTI:        0.72 m  MV PHT:        157.18 msec  MV Decel Time: 542 msec  MV E velocity: 148.00 cm/s 103 cm/s  MV A velocity: 197.00 cm/s 70.3 cm/s  MV E/A ratio:  0.75        1.5      Charlton Haws MD  Electronically signed by Charlton Haws MD  Signature Date/Time: 08/14/2019/4:59:31 PM                 EXERCISE STRESS REPORT         Patient Name:   Leary Roca Date of Exam: 11/25/2019  Medical Rec #:  [id number]           Height:       69.0 in  Accession #:    [id number]          Weight:       181.6 lb  Date of Birth:  1965/11/01          BSA:          1.98 m  Patient Age:    53 years            BP:           151/90 mmHg  Patient Gender: M                   HR:           88 bpm.  Exam Location:  [address]      Procedure: Stress Echo   Indications:  R06.00 Dyspnea     History:        Patient has prior history of Echocardiogram examinations,  most                  recent 08/14/2019. Mitral valve stenosis. Mitral  regurgitation.                  Rheumatic mitral valve. Mild aortic stenosis. LVH.     Sonographer:    Cathie Beams RCS  Referring Phys: [id number] [facility]   Transthoracic Stress Echo for Dyspnea evaluation.   Stress Protocol: The patient exercised on a treadmill for 10 minutes  minutes and 30 seconds to stage IV of a Bruce protocol, achieving 11 METS.  The patient exercised on a treadmill according to a Bruce protocol.   Protocol Termination:     Resting HR:     88           bpm Resting BP:  151/90  Target HR:      165          bpm Peak BP:     211/93  Peak HR:        173          bpm BP Response: hypertensive  % of Target HR: 103          bpm  HR Achieved:    was achieved   Patient Tolerance: The patient developed Chest tightness during the stress  exam. The symptoms resolved with rest.   EKG: The patient developed PVCs, Multifocal V cuplets, Short run  Vtach,  PACs during exercise.   LV Pre-Stress Systolic Function: Pre-stress testing the left ventricular  systolic function was normal systolic function with a 60-65% ejection  fraction.   LV Post-Stress Systolic Function: Post-stress testing, the left  ventricular systolic function was hyperdynamic systolic function with a  >65% ejection fraction.                HR  SysBP DiasBP    Symptoms  Stage I   123  189    79  Stage II  150  212    91   Chest tightness  Stage III 164  211    93  Stage IV  173   RECOVERY            Minutes            HR  SysBP DiasBP Symptoms  Immediately following Stress 153  180    80   PACs  2 minutes                    129  133    75  4 minutes                    116  6 minutes                    111  107    63  8 minutes                    106  10 minutes                        98     63   IMPRESSIONS     1. Left ventricular ejection fraction, by estimation, is 60 to 65%. The  left ventricle has normal function. The left ventricle  has no regional  wall motion abnormalities. There is mild left ventricular hypertrophy.  Left ventricular diastolic function  could not be evaluated.   2. Right ventricular systolic function is normal. The right ventricular  size is normal.   3. The mitral valve was not well visualized. No doppler mitral valve  regurgitation.   4. Tricuspid valve regurgitation No doppler.   5. The aortic valve was not well visualized. Aortic valve regurgitation  No doppler.   6. Post-stress: left ventricular systolic function was <65%.   7. Pre-stress: left ventricular systolic function was 60-65%.   8. Conclusion: Exercise ECG showed nonspecific ST changes, but PVCs were  noted with stress. The patient reported chest pain. There was no evidence  for exercise-induced wall motion abnormalities by echo imaging. No  evidence for ischemia.   FINDINGS   Left Ventricle: Left ventricular ejection fraction, by estimation, is 60  to  65%. The left ventricle has normal function. The left ventricle has no  regional wall motion abnormalities. There is mild. There is mild left  ventricular hypertrophy.   Right Ventricle: The right ventricular size is normal. No increase in  right ventricular wall thickness. Right ventricular systolic function is  normal.   Left Atrium: Left atrial size was not well visualized.   Right Atrium: Right atrial size was not well visualized.   Pericardium: There is no evidence of pericardial effusion.   Mitral Valve: The mitral valve was not well visualized. No doppler mitral  valve regurgitation.   Tricuspid Valve: The tricuspid valve is not well visualized. Tricuspid  valve regurgitation No doppler.   Aortic Valve: The aortic valve was not well visualized. Aortic valve  regurgitation No doppler.   Pulmonic Valve: The pulmonic valve was not well visualized. Pulmonic valve  regurgitation is not visualized.   Aorta: The aortic root was not well visualized.   Shunts: The interatrial septum was not well visualized.      Marca Ancona MD  Electronically signed by Marca Ancona MD  Signature Date/Time: 11/25/2019/9:01:30 PM                  TRANSESOPHOGEAL ECHO REPORT         Patient Name:   Leary Roca Date of Exam: 12/16/2019  Medical Rec #:  [id number]           Height:       69.0 in  Accession #:    [id number]          Weight:       184.2 lb  Date of Birth:  [birth year]          BSA:          1.995 m  Patient Age:    53 years            BP:           131/80 mmHg  Patient Gender: M                   HR:           78 bpm.  Exam Location:  Inpatient   Procedure: Transesophageal Echo                                 MODIFIED REPORT:  This report was modified by Weston Brass MD on 12/19/2019 due to  amendment.   Indications:     I05.0  Rheumatic mitral stenosis     History:         Patient has prior history of Echocardiogram examinations.                   Mitral  Valve Disease.     Sonographer:     Margreta Journey  Referring Phys:  [id number] [facility]  Diagnosing Phys: Weston Brass MD   PROCEDURE: After discussion of the risks and benefits of a TEE, an  informed consent was obtained from the patient. The transesophogeal probe  was passed without difficulty through the esophogus of the patient. Local  oropharyngeal anesthetic was provided  with Cetacaine. Sedation performed by different physician. The patient was  monitored while under deep sedation. The patient's vital signs; including  heart rate, blood pressure, and oxygen saturation; remained stable  throughout the procedure. The patient  developed no complications during the procedure.   IMPRESSIONS     1. Left ventricular ejection fraction, by estimation, is 60 to 65%. The  left ventricle has normal function. The left ventricle has no regional  wall motion abnormalities.   2. Right ventricular systolic function is normal. The right ventricular  size is normal. moderately increased right ventricular wall thickness.   3. Left atrial size was severely dilated. No left atrial/left atrial  appendage thrombus was detected.   4. Right atrial size was mildly dilated.   5. Severe, rheumatic mitral valve stenosis. Commisural fusion. Diastolic  doming particularly of anterior mitral leaflet. Moderate leaflet  calcifications. Mild calcification of the submitral apparatus, somewhat  obscured by shadowing. Variable cycle  length from ectopy impacts quantitation. 3D planimetry MVA = 1 cm2. MVA by  PHT with regular R-R = 1.50 cm2. MVA by PISA using a radius of 1 cm and  angle of 120 deg = 0.9 cm2. MVA by continuity equation = 1.29 cm2. Mean  gradient 6 mmHg at HR of 71 bpm.  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  The mitral valve is abnormal. Mild mitral valve regurgitation. Severe  mitral stenosis.   6. The aortic valve is grossly normal. Aortic valve  regurgitation is  trivial.   7. Agitated saline contrast bubble study was negative, with no evidence  of any interatrial shunt.   FINDINGS   Left Ventricle: Left ventricular ejection fraction, by estimation, is 60  to 65%. The left ventricle has normal function. The left ventricle has no  regional wall motion abnormalities. The left ventricular internal cavity  size was normal in size. There is   no left ventricular hypertrophy.   Right Ventricle: The right ventricular size is normal. Moderately  increased right ventricular wall thickness. Right ventricular systolic  function is normal.   Left Atrium: No LA mural thrombus or LAA thrombus noted. Left atrial size  was severely dilated. No left atrial/left atrial appendage thrombus was  detected.   Right Atrium: Right atrial size was mildly dilated. Prominent Eustachian  valve.   Pericardium: There is no evidence of pericardial effusion.   Mitral Valve: Severe, rheumatic mitral valve stenosis. Commisural fusion.  Diastolic doming particularly of anterior mitral leaflet. Moderate leaflet  calcifications. Mild calcification of the submitral apparatus, somewhat  obscured by shadowing. Variable  cycle length from ectopy impacts quantitation. 3D planimetry MVA = 1 cm2.  MVA by PHT with regular R-R = 1.50 cm2. MVA by PISA using a radius of 1 cm  and angle of 120 deg = 0.9 cm2. MVA by continuity equation =  1.29 cm2.  Mean gradient 6 mmHg at HR of 71  bpm. These findings in combination suggest severe rheumatic mitral  stenosis. The mitral valve is abnormal. There is moderate thickening of  the mitral valve leaflet(s). There is moderate calcification of the mitral  valve leaflet(s). Severely decreased  mobility of the mitral valve leaflets. Mild mitral valve regurgitation.  Severe mitral valve stenosis. MV peak gradient, 13.0 mmHg. The mean mitral  valve gradient is 6.0 mmHg.   Tricuspid Valve: The tricuspid valve is normal in structure.  Tricuspid  valve regurgitation is trivial.   Aortic Valve: The aortic valve is grossly normal. Aortic valve  regurgitation is trivial. There is mild calcification of the aortic valve.   Pulmonic Valve: The pulmonic valve was normal in structure. Pulmonic valve  regurgitation is trivial.   Aorta: The aortic root and ascending aorta are structurally normal, with  no evidence of dilitation.   IAS/Shunts: No atrial level shunt detected by color flow Doppler. Agitated  saline contrast was given intravenously to evaluate for intracardiac  shunting. Agitated saline contrast bubble study was negative, with no  evidence of any interatrial shunt. There   is no evidence of a patent foramen ovale.      LEFT VENTRICLE  PLAX 2D  LVOT diam:     2.10 cm  LV SV:         69  LV SV Index:   35  LVOT Area:     3.46 cm      AORTIC VALVE  LVOT Vmax:   125.00 cm/s  LVOT Vmean:  76.900 cm/s  LVOT VTI:    0.199 m     AORTA  Ao Sinus diam: 3.10 cm   MITRAL VALVE  MV Area (PHT): 1.50 cm   SHUNTS  MV Peak grad:  13.0 mmHg  Systemic VTI:  0.20 m  MV Mean grad:  6.0 mmHg   Systemic Diam: 2.10 cm  MV Vmax:       1.80 m/s  MV Vmean:      114.0 cm/s   Weston Brass MD  Electronically signed by Weston Brass MD  Signature Date/Time: 12/17/2019/6:47:54 AM                   RIGHT/LEFT HEART CATH AND CORONARY ANGIOGRAPHY             Conclusion   Conclusions:   1.Two vessel coronary artery disease with 70% stenosis involving ostium of OM3. There is mild disease involving the LAD and distal LCx.   2.Severe mitral stenosis (mean gradient 10 mmHg, valve area 1.3 cm^2).   3.Moderately elevated PCWP and normal LVEDP, consistent with mitral stenosis.   4.Mildly to moderately elevated right heart and pulmonary artery pressures.   5.Normal Fick cardiac output/index.   Recommendations:   1.Ongoing evaluation and management of rheumatic heart disease with severe mitral  stenosis per Dr. Duke Salvia.   2.Start aspirin 81 mg daily.   3.Optimize medical therapy and risk factor modification for coronary artery disease. Consider addition of statin therapy when the patient is seen for follow-up. Defer PCI to [facility] pending further workup of valvular heart disease.   Yvonne Kendall, MD   [facility]              Recommendations   Antiplatelet/Anticoag   Recommend Aspirin 81mg  daily for moderate CAD.        Discharge Date   In the absence of any other complications or medical issues, we expect  the patient to be ready for discharge from a cath perspective on 12/16/2019.             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 year-old man with history of rheumatic heart disease with moderate to severe mitral stenosis, presenting for evaluation of dyspnea on exertion.  GFR: >60 ml/min  Procedure: The risks, benefits, complications, treatment options, and expected outcomes were discussed with the patient. The patient and/or family concurred with the proposed plan, giving informed consent. The patient was sedated with IV midazolam and fentanyl. The right wrist was assessed with a modified Allens test which was normal. The right wrist and elbow were prepped and draped in a sterile fashion. 1% lidocaine was used for local anesthesia. A previously placed antecubital vein IV was exchanged for a 243F slender Glidesheath using modified Seldinger technique. Right heart catheterization was performed by advancing a 243F balloon-tipped catheter through the right heart chambers into the pulmonary capillary wedge position. Pressure measurements and oxygen saturations were obtained.  Using the modified Seldinger access technique, a 629F slender  Glidesheath was placed in the right radial artery. 3 mg Verapamil was given through the sheath. Heparin 4,000 units were administered. Selective coronary angiography was performed using 243F JL3.5 and JR4 catheters to engage the left and right coronary arteries, respectively. Left heart catheterization was performed using a 243F angled pigtail catheter. Simultaneous LV and PCWP pressures were obtained to calculate a transmitral gradient. Left ventriculogram was not performed.  At the end of the procedure, the radial artery sheath was removed and a TR band applied to achieve patent hemostasis. There were no immediate complications. The patient was taken to the recovery area in stable condition.  Estimated blood loss <50 mL.   During this procedure medications were administered to achieve and maintain moderate conscious sedation while the patient's heart rate, blood pressure, and oxygen saturation were continuously monitored and I was present face-to-face 100% of this time.             Medications  (Filter: Administrations occurring from 12/16/19 1355 to 12/16/19 1510)   Heparin (Porcine) in NaCl 1000-0.9 UT/500ML-% SOLN (mL)  Total volume: 1,000 mL    Date/Time        Rate/Dose/Volume   Action    12/16/19 1418       500 mL   Given    1418       500 mL   Given    lidocaine (PF) (XYLOCAINE) 1 % injection (mL)  Total volume: 4 mL    Date/Time        Rate/Dose/Volume   Action    12/16/19 1420       2 mL   Given    1431       2 mL   Given    fentaNYL (SUBLIMAZE) injection (mcg)  Total dose: 25 mcg    Date/Time        Rate/Dose/Volume   Action    12/16/19 1420       25 mcg   Given    midazolam (VERSED) injection (mg)  Total dose: 1 mg    Date/Time  Rate/Dose/Volume   Action    12/16/19 1421       1 mg   Given    Radial Cocktail/Verapamil only (mL)  Total volume: 10 mL    Date/Time         Rate/Dose/Volume   Action    12/16/19 1432       10 mL   Given    heparin injection (Units)  Total dose: 4,000 Units    Date/Time        Rate/Dose/Volume   Action    12/16/19 1433       4,000 Units   Given    iohexol (OMNIPAQUE) 350 MG/ML injection (mL)  Total volume: 35 mL    Date/Time        Rate/Dose/Volume   Action    12/16/19 1458       35 mL   Given             Sedation Time   Sedation Time Physician-1: 33 minutes 11 seconds                      Contrast   Medication Name   Total Dose    iohexol (OMNIPAQUE) 350 MG/ML injection   35 mL             Radiation/Fluoro   Fluoro time: 6.3 (min)   DAP: 21185 (mGycm2)   Cumulative Air Kerma: 412 (mGy)             Complications   Complications documented before study signed (12/16/2019 3:44 PM)    No complications were associated with this study.    Documented by Yvonne Kendall, MD - 12/16/2019 3:38 PM             Coronary Findings  Diagnostic  Dominance: Right    Left Main    Vessel is large. Vessel is angiographically normal.        Left Anterior Descending    Vessel is large.    Prox LAD lesion 20% stenosed    Prox LAD lesion is 20% stenosed. The lesion is eccentric.    Mid LAD lesion 20% stenosed    Mid LAD lesion is 20% stenosed. The lesion is eccentric. The lesion is moderately calcified.        First Diagonal Branch    Vessel is small in size.        Second Diagonal Branch    Vessel is small in size.        Third Diagonal Branch    Vessel is small in size.        Left Circumflex    Vessel is large.    Dist Cx lesion 30% stenosed    Dist Cx lesion is 30% stenosed.        First Obtuse Marginal Branch    Vessel is small in size.        Second Obtuse Marginal Branch    Vessel is small in size.        Third Obtuse Marginal  Branch    Vessel is moderate in size.    3rd Mrg lesion 70% stenosed    3rd Mrg lesion is 70% stenosed.        Fourth Obtuse Marginal Branch    Vessel is moderate in size.        Right Coronary Artery    Vessel is large. Vessel is angiographically normal.        Right Posterior Descending Artery  Vessel is moderate in size.        Right Posterior Atrioventricular Artery    Vessel is small in size.        First Right Posterolateral Branch    Vessel is small in size.        Second Right Posterolateral Branch    Vessel is small in size.    Intervention  No interventions have been documented.                           Right Heart   Right Heart Pressures   RA (mean): 10 mmHg RV (S/EDP): 53/10 mmHg PA (S/D, mean): 53/28 (36) mmHg PCWP (mean): 25 mmHg  Ao sat: 98% PA sat: 71%  Fick CO: 5.1 L/min Fick CI: 2.6 L/min/m^2                                                 Left Heart   Left Ventricle   LV end diastolic pressure is normal. LVEDP 10 mmHg.        Mitral Valve   There is severe mitral valve stenosis. Mean gradient 10 mmHg. Valve area 1.3 cm^2.        Aortic Valve   There is no aortic valve stenosis.             Coronary Diagrams  Diagnostic  Dominance: Right   &&&&&   Intervention                 Implants    No implant documentation for this case.                 Syngo Images   Link to Procedure Log    Show images for CARDIAC CATHETERIZATION   Procedure Log             Images on Long Term Storage    Show images for Mundt, Jacion M.                      Hemo Data        Most Recent Value    Fick Cardiac Output   5.14 L/min    Fick Cardiac Output Index   2.57 (L/min)/BSA    Mitral Mean Gradient   9.96 mmHg    Mitral Peak Gradient   9 mmHg    Mitral Valve Area  Index   0.65 cm2/BSA    RA A Wave   14 mmHg    RA V Wave   9 mmHg    RA Mean   8 mmHg    RV Systolic Pressure   43 mmHg    RV Diastolic Pressure   0 mmHg    RV EDP   9 mmHg    PA Systolic Pressure   51 mmHg    PA Diastolic Pressure   27 mmHg    PA Mean   34 mmHg    PW A Wave   23 mmHg    PW V Wave   19 mmHg    PW Mean   16 mmHg    AO Systolic Pressure   105 mmHg    AO Diastolic Pressure   57 mmHg    AO Mean   77 mmHg    LV Systolic Pressure  105 mmHg    LV Diastolic Pressure   2 mmHg    LV EDP   7 mmHg    AOp Systolic Pressure   110 mmHg    AOp Diastolic Pressure   65 mmHg    AOp Mean Pressure   84 mmHg    LVp Systolic Pressure   101 mmHg    LVp Diastolic Pressure   1 mmHg    LVp EDP Pressure   6 mmHg    QP/QS   1    TPVR Index   13.23 HRUI    TSVR Index   29.95 HRUI    PVR SVR Ratio   0.29    TPVR/TSVR Ratio   0.44             Impression:     Patient has rheumatic heart disease with stage D severe symptomatic mitral stenosis and mild to moderate mitral regurgitation.  He describes a several month history of exertional shortness of breath that typically occurs only with more strenuous physical exertion, consistent with chronic diastolic congestive heart failure, New York Heart Association functional class I-II.  He also has some atypical chest discomfort that is not related to physical exertion and occasional palpitations.  Recent stress echocardiogram was associated with onset of symptoms and episodes of nonsustained VT.     I have personally reviewed the patient's recent transthoracic and transesophageal echocardiograms as well as his diagnostic cardiac catheterization.  Echocardiograms reveal classical evidence for severe rheumatic mitral stenosis with mild to moderate mitral regurgitation.  There is severe left atrial enlargement with normal left  ventricular size and systolic function.  Right heart size and function appeared normal and there is trivial tricuspid regurgitation.  Diagnostic cardiac catheterization confirmed the presence of severe mitral stenosis and revealed mild to moderately elevated right heart pressures.  Coronary angiography reveals 70% ostial stenosis of an obtuse marginal branch of the left circumflex coronary artery but otherwise nonobstructive coronary artery disease.     I agree that it would be reasonable to consider elective mitral valve replacement in the near future.  Options include continued medical therapy with very close observation on medical therapy although the patient will remain at significant risk for progression of symptoms and/or the development of atrial fibrillation.  Functional anatomy of mitral valve does not appear favorable for an attempt at balloon mitral valvuloplasty.  Patient would likely be a good candidate for minimally invasive approach for surgery although this would obviate any possibility that concomitant surgical revascularization of the obtuse marginal branch of the left circumflex could be performed at the time of surgery.  The patient will likely need dental extraction performed prior to surgery.        Plan:     The patient was counseled at length regarding the indications, risks and potential benefits of mitral valve replacement.  The rationale for elective surgery has been explained, including a comparison between surgery and continued medical therapy with close follow-up.  Alternative surgical approaches have been discussed including a comparison between conventional sternotomy and minimally-invasive techniques, as well as the presence of likely asymptomatic coronary artery disease with 70% stenosis of the obtuse marginal branch of the left circumflex coronary artery.  The relative risks and benefits of each have been reviewed as they pertain to the patient's specific  circumstances, and expectations for the patient's postoperative convalescence has been discussed.  We discussed the possibility of replacing the mitral valve using a mechanical prosthesis with the attendant need for  long-term anticoagulation versus the alternative of replacing it using a bioprosthetic tissue valve with its potential for late structural valve deterioration and failure, depending upon the patient's longevity.  The relevance of the presence of poor dentition was explained including the need for preoperative dental service consultation and possible extraction.        The patient is interested in proceeding with surgical intervention in the near future.  As a next step he will be referred to the dental service clinic for evaluation and possible dental extraction.  He will also undergo CT angiography to evaluate the feasibility of peripheral cannulation for surgery.  Finally, the patient has been strongly encouraged to quit smoking and has been referred to the quit smoking program for assistance.           I spent in excess of 90 minutes during the conduct of this office consultation and >50% of this time involved direct face-to-face encounter with the patient for counseling and/or coordination of their care.        Salvatore Decent. [name], MD  12/23/2019  9:43 AM                   Electronically signed by Purcell Nails, MD at 12/23/2019 10:42 AM

## 2020-01-01 ENCOUNTER — Telehealth: Payer: Self-pay | Admitting: *Deleted

## 2020-01-01 NOTE — Telephone Encounter (Signed)
Alba Cory, LPN  I wanted to inform you that Dr. Leonides Schanz is cancelling Melvin Burns's hematology appointment that was scheduled for 3/25 at 2pm. Per Dr. Leonides Schanz, he will reach out to your office.   Sue Lush    Will forward to Dr Duke Salvia so she will be aware

## 2020-01-02 ENCOUNTER — Inpatient Hospital Stay: Payer: No Typology Code available for payment source

## 2020-01-02 ENCOUNTER — Inpatient Hospital Stay: Payer: No Typology Code available for payment source | Admitting: Hematology and Oncology

## 2020-01-08 ENCOUNTER — Ambulatory Visit
Admission: RE | Admit: 2020-01-08 | Discharge: 2020-01-08 | Disposition: A | Discharge status: Home / Self Care | Payer: No Typology Code available for payment source | Source: Ambulatory Visit | Attending: Thoracic Surgery (Cardiothoracic Vascular Surgery) | Admitting: Thoracic Surgery (Cardiothoracic Vascular Surgery)

## 2020-01-08 DIAGNOSIS — I050 Rheumatic mitral stenosis: Secondary | ICD-10-CM

## 2020-01-08 DIAGNOSIS — I7101 Dissection of thoracic aorta: Secondary | ICD-10-CM

## 2020-01-08 DIAGNOSIS — I71019 Dissection of thoracic aorta, unspecified: Secondary | ICD-10-CM

## 2020-01-08 DIAGNOSIS — R911 Solitary pulmonary nodule: Secondary | ICD-10-CM

## 2020-01-08 HISTORY — DX: Solitary pulmonary nodule: R91.1

## 2020-01-08 MED ORDER — IOPAMIDOL (ISOVUE-370) INJECTION 76%
75.0000 mL | Freq: Once | INTRAVENOUS | Status: AC | PRN
  Administered 2020-01-08: 75 mL via INTRAVENOUS

## 2020-01-13 ENCOUNTER — Other Ambulatory Visit: Payer: Self-pay

## 2020-01-13 ENCOUNTER — Ambulatory Visit (HOSPITAL_COMMUNITY): Payer: Self-pay | Admitting: Dentistry

## 2020-01-13 ENCOUNTER — Ambulatory Visit (INDEPENDENT_AMBULATORY_CARE_PROVIDER_SITE_OTHER): Payer: No Typology Code available for payment source | Admitting: Thoracic Surgery (Cardiothoracic Vascular Surgery)

## 2020-01-13 ENCOUNTER — Encounter: Payer: Self-pay | Admitting: Thoracic Surgery (Cardiothoracic Vascular Surgery)

## 2020-01-13 ENCOUNTER — Encounter (HOSPITAL_COMMUNITY): Payer: Self-pay | Admitting: Dentistry

## 2020-01-13 VITALS — BP 129/83 | HR 77 | Temp 97.7°F | Resp 16 | Ht 69.0 in | Wt 176.0 lb

## 2020-01-13 VITALS — BP 135/82 | HR 79 | Temp 99.9°F

## 2020-01-13 DIAGNOSIS — R911 Solitary pulmonary nodule: Secondary | ICD-10-CM

## 2020-01-13 DIAGNOSIS — I340 Nonrheumatic mitral (valve) insufficiency: Secondary | ICD-10-CM | POA: Diagnosis not present

## 2020-01-13 DIAGNOSIS — I050 Rheumatic mitral stenosis: Secondary | ICD-10-CM

## 2020-01-13 DIAGNOSIS — K08199 Complete loss of teeth due to other specified cause, unspecified class: Secondary | ICD-10-CM

## 2020-01-13 DIAGNOSIS — K08109 Complete loss of teeth, unspecified cause, unspecified class: Secondary | ICD-10-CM

## 2020-01-13 DIAGNOSIS — K0820 Unspecified atrophy of edentulous alveolar ridge: Secondary | ICD-10-CM

## 2020-01-13 DIAGNOSIS — Z01818 Encounter for other preprocedural examination: Secondary | ICD-10-CM

## 2020-01-13 NOTE — Progress Notes (Signed)
POST OPERATIVE NOTE:  01/13/2020 Melvin Burns [id number]  COVID 19 SCREENING: The patient does not symptoms concerning for COVID-19 infection (Including fever, chills, cough, or new SHORTNESS OF BREATH).    VITALS: BP 135/82 (BP Location: Left Arm)   Pulse 79   Temp 99.9 F (37.7 C)   LABS:  Lab Results  Component Value Date   WBC 9.1 12/27/2019   HGB 14.1 12/27/2019   HCT 44.9 12/27/2019   MCV 89.8 12/27/2019   PLT 208 12/27/2019   BMET    Component Value Date/Time   NA 139 12/27/2019 1155   NA 136 12/10/2019 1621   K 3.6 12/27/2019 1155   CL 100 12/27/2019 1155   CO2 30 12/27/2019 1155   GLUCOSE 106 (H) 12/27/2019 1155   BUN 8 12/27/2019 1155   BUN 11 12/10/2019 1621   CREATININE 0.94 12/27/2019 1155   CREATININE 0.99 06/01/2016 1741   CALCIUM 10.0 12/27/2019 1155   GFRNONAA >60 12/27/2019 1155   GFRNONAA 88 06/01/2016 1741   GFRAA >60 12/27/2019 1155   GFRAA >89 06/01/2016 1741    No results found for: INR, PROTIME No results found for: PTT   [name] is status post multiple extractions with alveoloplasty and preprosthetic surgery in the operating with general anesthesia on 12/31/2019.  Patient now presents for evaluation of healing and suture removal.  SUBJECTIVE: Patient denies having any significant discomfort.  Patient indicates that he does have several stitches that remain.  EXAM: There is no sign of infection, heme, or ooze.  Patient is healing in by generalized primary closure.  Several sutures are still present. Patient is now edentulous.  There is atrophy of the edentulous alveolar ridges.  PROCEDURE: The patient was given a chlorhexidine gluconate rinse for 30 seconds. Sutures were then removed without complication. Patient tolerated the procedure well.  ASSESSMENT: Post operative course is consistent with dental procedures performed in the operating with general anesthesia on 12/31/2019. Loss of teeth due to  extraction Patient is now completely edentulous. There is some atrophy of the edentulous alveolar ridges.  PLAN: 1.  Continue salt water rinses as needed to aid healing. 2.  Advance diet as tolerated. 3.  Follow-up with a dentist of his choice for fabrication of upper and lower complete dentures after adequate healing and once medically stable from the anticipated heart valve surgery.  Patient was given the name of Dr. Alberteen Spindle and Dr. Jonnie Finner as potential dentists to fabricate his dentures. 4.  Patient is cleared dentally for heart valve surgery with Dr. [name] at his discretion.   Charlynne Pander, DDS

## 2020-01-13 NOTE — Patient Instructions (Signed)
Stop smoking.  Continue taking all current medications without change through the day before surgery.  Make sure to bring all of your medications with you when you come for your Pre-Admission Testing appointment at [facility] Short-Stay Department.  Have nothing to eat or drink after midnight the night before surgery.  On the morning of surgery do not take any medications.  At your appointment for Pre-Admission Testing at the [facility] Short-Stay Department you will be asked to sign permission forms for your upcoming surgery.  By definition your signature on these forms implies that you and/or your designee provide full informed consent for your planned surgical procedure(s), that alternative treatment options have been discussed, that you understand and accept any and all potential risks, and that you have some understanding of what to expect for your post-operative convalescence.  For any major cardiac surgical procedure potential operative risks include but are not limited to at least some risk of death, stroke or other neurologic complication, myocardial infarction, congestive heart failure, respiratory failure, renal failure, bleeding requiring blood transfusion and/or reexploration, irregular heart rhythm, heart block or bradycardia requiring permanent pacemaker, pneumonia, pericardial effusion, pleural effusion, wound infection, pulmonary embolus or other thromboembolic complication, chronic pain, or other complications related to the specific procedure(s) performed.  Please call to schedule a follow-up appointment in our office prior to surgery if you have any unresolved questions about your planned surgical procedure, the associated risks, alternative treatment options, and/or expectations for your post-operative recovery.

## 2020-01-13 NOTE — Patient Instructions (Signed)
PLAN: 1.  Continue salt water rinses as needed to aid healing. 2.  Advance diet as tolerated. 3.  Follow-up with a dentist of his choice for fabrication of upper and lower complete dentures after adequate healing and once medically stable from the anticipated heart valve surgery.  Patient was given the name of Dr. Alberteen Spindle and Dr. Jonnie Finner as potential dentists to fabricate his dentures. 4.  Patient is cleared dentally for heart valve surgery with Dr. [name] at his discretion.   Charlynne Pander, DDS

## 2020-01-13 NOTE — Progress Notes (Signed)
[address]       [place] [address]             [phone]     CARDIOTHORACIC SURGERY OFFICE NOTE  Primary Cardiologist is Chilton Si, MD PCP is Quentin Angst, MD   HPI:  Patient is a 54 year old male originally from [place] in [place] with history of rheumatic mitral valve disease and hypertension who returns to the office today to further discuss treatment options for management of severe symptomatic mitral stenosis with mitral regurgitation.  He was originally seen in consultation on December 23, 2019.  Since then he has been evaluated by Dr. Robin Searing and undergone dental extraction.  He was seen in follow-up earlier today and has been cleared for elective mitral valve surgery.  The patient also has been making a concerted effort to quit smoking.  He states that he has cut back considerably, now taking approximately 3 or 4 days to finish a pack of cigarettes down from 1 pack daily previously.  He reports no new problems or complaints and is eager to proceed with elective mitral valve replacement in the near future.   Current Outpatient Medications  Medication Sig Dispense Refill  . amLODipine (NORVASC) 10 MG tablet TAKE 1 TABLET(10 MG) BY MOUTH DAILY (Patient taking differently: Take 10 mg by mouth daily. ) 90 tablet 2  . aspirin EC 81 MG tablet Take 1 tablet (81 mg total) by mouth daily.    . chlorthalidone (HYGROTON) 25 MG tablet TAKE 1 TABLET(25 MG) BY MOUTH DAILY (Patient taking differently: Take 25 mg by mouth daily. ) 90 tablet 2  . lisinopril (ZESTRIL) 10 MG tablet TAKE 1 TABLET(10 MG) BY MOUTH DAILY (Patient taking differently: Take 10 mg by mouth daily. ) 90 tablet 1   No current facility-administered medications for this visit.      Physical Exam:   BP 129/83 (BP Location: Right Arm, Patient Position: Sitting, Cuff Size: Normal)   Pulse 77   Temp 97.7 F (36.5 C) (Temporal)   Resp 16   Ht 5\' 9"  (1.753 m)   Wt 176 lb (79.8 kg)    SpO2 95% Comment: RA  BMI 25.99 kg/m   General:  Well-appearing  Chest:   Clear to auscultation  CV:   Regular rate and rhythm with systolic murmur  Incisions:  n/a  Abdomen:  Soft nontender  Extremities:  Warm and well-perfused  Diagnostic Tests:  CT ANGIOGRAPHY CHEST, ABDOMEN AND PELVIS  TECHNIQUE: Multidetector CT imaging through the chest, abdomen and pelvis was performed using the standard protocol during bolus administration of intravenous contrast. Multiplanar reconstructed images and MIPs were obtained and reviewed to evaluate the vascular anatomy.  CONTRAST:  47mL ISOVUE-370 IOPAMIDOL (ISOVUE-370) INJECTION 76%  COMPARISON:  None.  FINDINGS: CTA CHEST FINDINGS  Cardiovascular: Heart size normal. No pericardial effusion. Incomplete opacification of the pulmonary arterial tree; the exam was not optimized for detection of pulmonary emboli. Left atrial enlargement. Coronary calcifications. Good contrast opacification of the thoracic aorta; no aneurysm, dissection, or stenosis. Classic 3 vessel brachiocephalic arterial origin anatomy without proximal stenosis. No significant atheromatous change.  Mediastinum/Nodes: No hilar or mediastinal adenopathy.  Lungs/Pleura: No pleural effusion. No pneumothorax. 3 nodules measuring 3-6 mm, abutting the minor fissure, possibly intrapulmonary lymph nodes but nonspecific. emphysematous changes in the upper lobes. Lungs otherwise clear.  Musculoskeletal: No chest wall abnormality. No acute or significant osseous findings.  Review of the MIP images confirms the above findings.  CTA ABDOMEN AND PELVIS FINDINGS  VASCULAR  Aorta: Normal caliber aorta without aneurysm, dissection, vasculitis or significant stenosis. Minimal atheromatous calcified plaque in the infrarenal segment.  Celiac: Patent without evidence of aneurysm, dissection, vasculitis or significant stenosis.  SMA: Patent without evidence of  aneurysm, dissection, vasculitis or significant stenosis.  Renals: Duplicated left, inferior dominant, both patent.  Duplicated right, inferior dominant, both patent.  IMA: Patent without evidence of aneurysm, dissection, vasculitis or significant stenosis.  Inflow: Minimal calcified atheromatous plaque in bilateral common and internal iliac arteries. No significant stenosis, aneurysm, or dissection. External iliac arteries widely patent. Visualized proximal lower extremity arterial outflow minimally atheromatous, patent.  Veins: No obvious venous abnormality within the limitations of this arterial phase study.  Review of the MIP images confirms the above findings.  NON-VASCULAR  Hepatobiliary: No focal liver abnormality is seen. No gallstones, gallbladder wall thickening, or biliary dilatation.  Pancreas: Unremarkable. No pancreatic ductal dilatation or surrounding inflammatory changes.  Spleen: Normal in size without focal abnormality.  Adrenals/Urinary Tract: Adrenal glands unremarkable. Kidneys enhance normally, without hydronephrosis. Urinary bladder physiologically distended.  Stomach/Bowel: Stomach incompletely distended. Small bowel is decompressed. Normal appendix. The colon is nondilated, unremarkable.  Lymphatic: No abdominal or pelvic adenopathy.  Reproductive: Prostate is unremarkable.  Other: No ascites. No free air.  Musculoskeletal: No acute or significant osseous findings.  Review of the MIP images confirms the above findings.  IMPRESSION: 1. Minimal aortoiliac atherosclerosis (ICD10-170.0), without dissection or other acute findings. 2. Coronary calcifications. The severity of this disease and any potential stenosis cannot be assessed on this non-gated CT examination. 3. Small right lung nodules. No follow-up needed if patient is low-risk (and has no known or suspected primary neoplasm). Non-contrast chest CT can be  considered in 12 months if patient is high-risk. This recommendation follows the consensus statement: Guidelines for Management of Incidental Pulmonary Nodules Detected on CT Images: From the Fleischner Society 2017; Radiology 2017; 284:228-243.   Electronically Signed   By: Corlis Leak M.D.   On: 01/08/2020 14:47   Impression:  Patient has rheumatic heart disease with stage D severe symptomatic mitral stenosis and mild to moderate mitral regurgitation.  He describes a several month history of exertional shortness of breath that typically occurs only with more strenuous physical exertion, consistent with chronic diastolic congestive heart failure, New York Heart Association functional class I-II.  He also has some atypical chest discomfort that is not related to physical exertion and occasional palpitations.  Recent stress echocardiogram was associated with onset of symptoms and episodes of nonsustained VT.  I have personally reviewed the patient's recent transthoracic and transesophageal echocardiograms as well as his diagnostic cardiac catheterization and CT angiogram.  Echocardiograms reveal classical evidence for severe rheumatic mitral stenosis with mild to moderate mitral regurgitation.  There is severe left atrial enlargement with normal left ventricular size and systolic function.  Right heart size and function appeared normal and there is trivial tricuspid regurgitation.  Diagnostic cardiac catheterization confirmed the presence of severe mitral stenosis and revealed mild to moderately elevated right heart pressures.  Coronary angiography reveals 70% ostial stenosis of an obtuse marginal branch of the left circumflex coronary artery but otherwise nonobstructive coronary artery disease.  I agree that it would be reasonable to consider elective mitral valve replacement in the near future.  Options include continued medical therapy with very close observation on medical therapy although  the patient will remain at significant risk for progression of symptoms and/or the development of atrial fibrillation.  Functional anatomy of mitral valve does not appear favorable for an attempt at balloon mitral valvuloplasty.  Patient appears to be a good candidate for minimally invasive approach for surgery although this would obviate any possibility that concomitant surgical revascularization of the obtuse marginal branch of the left circumflex could be performed at the time of surgery.    CT angiography does reveal a few small benign-appearing nodules in the right lung for which the patient should probably undergo follow-up CT imaging in approximately 1 year because of his history of longstanding tobacco use.   Plan:  The patient was again counseled at length regarding the indications, risks and potential benefits of mitral valve replacement.  The rationale for elective surgery has been explained, including a comparison between surgery and continued medical therapy with close follow-up.  Alternative surgical approaches have been discussed including a comparison between conventional sternotomy and minimally-invasive techniques, as well as the presence of likely asymptomatic coronary artery disease with 70% stenosis of the obtuse marginal branch of the left circumflex coronary artery.  The relative risks and benefits of each have been reviewed as they pertain to the patient's specific circumstances, and expectations for the patient's postoperative convalescence has been discussed including time off from work.    The patient is concerned about how long it will take before he can return to work and prefers that we proceed with minimally invasive approach in an effort to expedite his recovery.  We discussed the possibility of replacing the mitral valve using a mechanical prosthesis with the attendant need for long-term anticoagulation versus the alternative of replacing it using a bioprosthetic tissue valve  with its potential for late structural valve deterioration and failure, depending upon the patient's longevity.  The patient seems reluctant to consider issues related to long-term warfarin anticoagulation and expresses preference that we replace his valve using a bioprosthetic tissue valve.   We plan to proceed with surgery on Feb 12, 2020.  Patient will return to our office for follow-up prior to surgery on Feb 10, 2020.    I spent in excess of 15 minutes during the conduct of this office consultation and >50% of this time involved direct face-to-face encounter with the patient for counseling and/or coordination of their care.    Valentina Gu. Roxy Manns, MD 01/13/2020 12:20 PM

## 2020-01-14 ENCOUNTER — Encounter: Payer: Self-pay | Admitting: *Deleted

## 2020-01-14 ENCOUNTER — Other Ambulatory Visit: Payer: Self-pay | Admitting: *Deleted

## 2020-01-14 DIAGNOSIS — I340 Nonrheumatic mitral (valve) insufficiency: Secondary | ICD-10-CM

## 2020-01-14 DIAGNOSIS — I050 Rheumatic mitral stenosis: Secondary | ICD-10-CM

## 2020-01-22 ENCOUNTER — Encounter: Payer: Self-pay | Admitting: *Deleted

## 2020-02-10 ENCOUNTER — Other Ambulatory Visit (HOSPITAL_COMMUNITY): Payer: No Typology Code available for payment source

## 2020-02-10 ENCOUNTER — Ambulatory Visit: Payer: No Typology Code available for payment source | Admitting: Thoracic Surgery (Cardiothoracic Vascular Surgery)

## 2020-02-28 NOTE — Pre-Procedure Instructions (Signed)
Your procedure is scheduled on Wednesday, Mar 04, 2020, from 07:30 AM- 2:16 PM.  Report to [facility] Main Entrance "A" at 05:30 A.M., and check in at the Admitting office.  Call this number if you have problems the morning of surgery:  [phone]  Call [phone] if you have any questions prior to your surgery date Monday-Friday 8am-4pm.    Remember:  Do not eat or drink after midnight the night before your surgery.    Take these medicines the morning of surgery with A SIP OF WATER : amLODipine (NORVASC)  Follow your surgeon's instructions on when to stop Aspirin.  If no instructions were given by your surgeon then you will need to call the office to get those instructions.     As of today, STOP taking any Aspirin (unless otherwise instructed by your surgeon) and Aspirin containing products, Aleve, Naproxen, Ibuprofen, Motrin, Advil, Goody's, BC's, all herbal medications, fish oil, and all vitamins.                      Do not wear jewelry.            Do not wear lotions, powders, colognes, or deodorant.            Men may shave face and neck.            Do not bring valuables to the hospital.            [facility] is not responsible for any belongings or valuables.  Do NOT Smoke (Tobacco/Vapping) or drink Alcohol 24 hours prior to your procedure.  If you use a CPAP at night, you may bring all equipment for your overnight stay.   Contacts, glasses, dentures or bridgework may not be worn into surgery.      For patients admitted to the hospital, discharge time will be determined by your treatment team.   Patients discharged the day of surgery will not be allowed to drive home, and someone needs to stay with them for 24 hours.    Special instructions:   [place]- Preparing For Surgery  Before surgery, you can play an important role. Because skin is not sterile, your skin needs to be as free of germs as possible. You can reduce the number of germs on your skin by  washing with CHG (chlorahexidine gluconate) Soap before surgery.  CHG is an antiseptic cleaner which kills germs and bonds with the skin to continue killing germs even after washing.    Oral Hygiene is also important to reduce your risk of infection.  Remember - BRUSH YOUR TEETH THE MORNING OF SURGERY WITH YOUR REGULAR TOOTHPASTE  Please do not use if you have an allergy to CHG or antibacterial soaps. If your skin becomes reddened/irritated stop using the CHG.  Do not shave (including legs and underarms) for at least 48 hours prior to first CHG shower. It is OK to shave your face.  Please follow these instructions carefully.   1. Shower the NIGHT BEFORE SURGERY and the MORNING OF SURGERY with CHG Soap.   2. If you chose to wash your hair, wash your hair first as usual with your normal shampoo.  3. After you shampoo, rinse your hair and body thoroughly to remove the shampoo.  4. Use CHG as you would any other liquid soap. You can apply CHG directly to the skin and wash gently with a scrungie or a clean washcloth.   5. Apply the CHG  Soap to your body ONLY FROM THE NECK DOWN.  Do not use on open wounds or open sores. Avoid contact with your eyes, ears, mouth and genitals (private parts). Wash Face and genitals (private parts)  with your normal soap.   6. Wash thoroughly, paying special attention to the area where your surgery will be performed.  7. Thoroughly rinse your body with warm water from the neck down.  8. DO NOT shower/wash with your normal soap after using and rinsing off the CHG Soap.  9. Pat yourself dry with a CLEAN TOWEL.  10. Wear CLEAN PAJAMAS to bed the night before surgery, wear comfortable clothes the morning of surgery  11. Place CLEAN SHEETS on your bed the night of your first shower and DO NOT SLEEP WITH PETS.   Day of Surgery:   Do not apply any deodorants/lotions.  Please wear clean clothes to the hospital/surgery center.   Remember to brush your teeth WITH  YOUR REGULAR TOOTHPASTE.   Please read over the following fact sheets that you were given.

## 2020-03-02 ENCOUNTER — Encounter (HOSPITAL_COMMUNITY)
Admission: RE | Admit: 2020-03-02 | Discharge: 2020-03-02 | Disposition: A | Discharge status: Home / Self Care | Payer: No Typology Code available for payment source | Source: Ambulatory Visit | Attending: Thoracic Surgery (Cardiothoracic Vascular Surgery) | Admitting: Thoracic Surgery (Cardiothoracic Vascular Surgery)

## 2020-03-02 ENCOUNTER — Encounter (HOSPITAL_COMMUNITY): Payer: Self-pay

## 2020-03-02 ENCOUNTER — Ambulatory Visit (INDEPENDENT_AMBULATORY_CARE_PROVIDER_SITE_OTHER): Payer: No Typology Code available for payment source | Admitting: Thoracic Surgery (Cardiothoracic Vascular Surgery)

## 2020-03-02 ENCOUNTER — Encounter: Payer: Self-pay | Admitting: Thoracic Surgery (Cardiothoracic Vascular Surgery)

## 2020-03-02 ENCOUNTER — Other Ambulatory Visit: Payer: Self-pay

## 2020-03-02 ENCOUNTER — Ambulatory Visit (HOSPITAL_COMMUNITY)
Admission: RE | Admit: 2020-03-02 | Discharge: 2020-03-02 | Disposition: A | Discharge status: Home / Self Care | Payer: No Typology Code available for payment source | Source: Ambulatory Visit | Attending: Thoracic Surgery (Cardiothoracic Vascular Surgery) | Admitting: Thoracic Surgery (Cardiothoracic Vascular Surgery)

## 2020-03-02 ENCOUNTER — Ambulatory Visit (HOSPITAL_COMMUNITY): Payer: No Typology Code available for payment source

## 2020-03-02 VITALS — BP 190/100 | HR 83 | Temp 97.8°F | Resp 20 | Ht 66.0 in | Wt 182.0 lb

## 2020-03-02 DIAGNOSIS — I340 Nonrheumatic mitral (valve) insufficiency: Secondary | ICD-10-CM | POA: Insufficient documentation

## 2020-03-02 DIAGNOSIS — Z20822 Contact with and (suspected) exposure to covid-19: Secondary | ICD-10-CM | POA: Diagnosis not present

## 2020-03-02 DIAGNOSIS — I052 Rheumatic mitral stenosis with insufficiency: Secondary | ICD-10-CM | POA: Diagnosis not present

## 2020-03-02 DIAGNOSIS — I050 Rheumatic mitral stenosis: Secondary | ICD-10-CM | POA: Diagnosis not present

## 2020-03-02 LAB — COMPREHENSIVE METABOLIC PANEL
ALT: 25 U/L (ref 0–44)
AST: 23 U/L (ref 15–41)
Albumin: 3.5 g/dL (ref 3.5–5.0)
Alkaline Phosphatase: 65 U/L (ref 38–126)
Anion gap: 10 (ref 5–15)
BUN: 12 mg/dL (ref 6–20)
CO2: 19 mmol/L — ABNORMAL LOW (ref 22–32)
Calcium: 8.9 mg/dL (ref 8.9–10.3)
Chloride: 109 mmol/L (ref 98–111)
Creatinine, Ser: 0.84 mg/dL (ref 0.61–1.24)
GFR calc Af Amer: >60 mL/min (ref >60)
GFR calc non Af Amer: >60 mL/min (ref >60)
Glucose, Bld: 70 mg/dL (ref 70–99)
Potassium: 3.5 mmol/L (ref 3.5–5.1)
Sodium: 138 mmol/L (ref 135–145)
Total Bilirubin: 0.4 mg/dL (ref 0.3–1.2)
Total Protein: 6.9 g/dL (ref 6.5–8.1)

## 2020-03-02 LAB — TYPE AND SCREEN
ABO/RH(D): O POS
Antibody Screen: NEGATIVE

## 2020-03-02 LAB — CBC
HCT: 39.5 % (ref 39.0–52.0)
Hemoglobin: 12.5 g/dL — ABNORMAL LOW (ref 13.0–17.0)
MCH: 28.6 pg (ref 26.0–34.0)
MCHC: 31.6 g/dL (ref 30.0–36.0)
MCV: 90.4 fL (ref 80.0–100.0)
Platelets: 206 10*3/uL (ref 150–400)
RBC: 4.37 MIL/uL (ref 4.22–5.81)
RDW: 11.9 % (ref 11.5–15.5)
WBC: 9.8 10*3/uL (ref 4.0–10.5)
nRBC: 0 % (ref 0.0–0.2)

## 2020-03-02 LAB — BLOOD GAS, ARTERIAL
Acid-base deficit: 0.4 mmol/L (ref 0.0–2.0)
Bicarbonate: 23.5 mmol/L (ref 20.0–28.0)
Drawn by: 421801
FIO2: 21
O2 Saturation: 98.9 %
Patient temperature: 37
pCO2 arterial: 37.5 mmHg (ref 32.0–48.0)
pH, Arterial: 7.415 (ref 7.350–7.450)
pO2, Arterial: 129 mmHg — ABNORMAL HIGH (ref 83.0–108.0)

## 2020-03-02 LAB — HEMOGLOBIN A1C
Hgb A1c MFr Bld: 5.2 % (ref 4.8–5.6)
Mean Plasma Glucose: 102.54 mg/dL

## 2020-03-02 LAB — SURGICAL PCR SCREEN
MRSA, PCR: NEGATIVE
Staphylococcus aureus: NEGATIVE

## 2020-03-02 LAB — PROTIME-INR
INR: 1 (ref 0.8–1.2)
Prothrombin Time: 12.6 seconds (ref 11.4–15.2)

## 2020-03-02 LAB — APTT: aPTT: 34 seconds (ref 24–36)

## 2020-03-02 LAB — SARS CORONAVIRUS 2 (TAT 6-24 HRS): SARS Coronavirus 2: NEGATIVE

## 2020-03-02 LAB — ABO/RH: ABO/RH(D): O POS

## 2020-03-02 NOTE — Patient Instructions (Signed)
   Continue taking all current medications without change through the day before surgery.  Make sure to bring all of your medications with you when you come for your Pre-Admission Testing appointment at [facility] Short-Stay Department.  Have nothing to eat or drink after midnight the night before surgery.  On the morning of surgery do not take any medications.  At your appointment for Pre-Admission Testing at the [facility] Short-Stay Department you will be asked to sign permission forms for your upcoming surgery.  By definition your signature on these forms implies that you and/or your designee provide full informed consent for your planned surgical procedure(s), that alternative treatment options have been discussed, that you understand and accept any and all potential risks, and that you have some understanding of what to expect for your post-operative convalescence.  For any major cardiac surgical procedure potential operative risks include but are not limited to at least some risk of death, stroke or other neurologic complication, myocardial infarction, congestive heart failure, respiratory failure, renal failure, bleeding requiring blood transfusion and/or reexploration, irregular heart rhythm, heart block or bradycardia requiring permanent pacemaker, pneumonia, pericardial effusion, pleural effusion, wound infection, pulmonary embolus or other thromboembolic complication, chronic pain, or other complications related to the specific procedure(s) performed.  Please call to schedule a follow-up appointment in our office prior to surgery if you have any unresolved questions about your planned surgical procedure, the associated risks, alternative treatment options, and/or expectations for your post-operative recovery.   

## 2020-03-02 NOTE — Progress Notes (Signed)
Pre MVR has been completed.   Preliminary results in CV Proc.   Blanch Media 03/02/2020 3:12 PM

## 2020-03-02 NOTE — Progress Notes (Signed)
PCP - Dr. Hyman Hopes Cardiologist - Dr. Duke Salvia   Chest x-ray - 03/02/20 EKG - 03/02/20 Stress Test - 11/25/19 ECHO - 12/16/19 Cardiac Cath -12/16/19   Sleep Study - denies  Aspirin Instructions: Patient instructed to hold all Aspirin, NSAID's, herbal medications, fish oil and vitamins 7 days prior to surgery.   ERAS Protcol -N/A PRE-SURGERY Ensure or G2- N/A  COVID TEST- pending results.    Anesthesia review: cardiac history  Patient denies shortness of breath, fever, cough and chest pain at PAT appointment   All instructions explained to the patient, with a verbal understanding of the material. Patient agrees to go over the instructions while at home for a better understanding. Patient also instructed to self quarantine after being tested for COVID-19. The opportunity to ask questions was provided.

## 2020-03-02 NOTE — Progress Notes (Signed)
[address]       [place] [address]             [phone]     CARDIOTHORACIC SURGERY OFFICE NOTE  Primary Cardiologist is Chilton Si, MD PCP is Chilton Si, MD   HPI:  Patient is a 54 year old male originally from [place] in [place] with history of rheumatic mitral valve disease and hypertension who returns to the office today for follow up of severe symptomatic mitral stenosis with mitral regurgitation.  He was originally seen in consultation on December 23, 2019 and he was last seen here on January 13, 2020 at which time we made tentative plans for surgery.  Since then he apparently has been fully vaccinated for COVID-19 using the Pfizer vaccine.  He returns to our office today with his cousin present for final consultation visit prior to surgery.  He reports no new problems or complaints.  He did not take his blood pressure medications this morning   Current Outpatient Medications  Medication Sig Dispense Refill  . amLODipine (NORVASC) 10 MG tablet TAKE 1 TABLET(10 MG) BY MOUTH DAILY (Patient taking differently: Take 10 mg by mouth daily. ) 90 tablet 2  . chlorthalidone (HYGROTON) 25 MG tablet TAKE 1 TABLET(25 MG) BY MOUTH DAILY (Patient taking differently: Take 25 mg by mouth daily. ) 90 tablet 2  . lisinopril (ZESTRIL) 10 MG tablet TAKE 1 TABLET(10 MG) BY MOUTH DAILY (Patient taking differently: Take 10 mg by mouth daily. ) 90 tablet 1  . aspirin EC 81 MG tablet Take 1 tablet (81 mg total) by mouth daily. (Patient not taking: Reported on 03/02/2020)     No current facility-administered medications for this visit.      Physical Exam:   BP (!) 190/100   Pulse 83   Temp 97.8 F (36.6 C) (Skin)   Resp 20   Ht 5\' 6"  (1.676 m)   Wt 182 lb (82.6 kg)   SpO2 99% Comment: RA  BMI 29.38 kg/m   General:  Well-appearing  Chest:   Clear to auscultation  CV:   Regular rate and rhythm  Incisions:  n/a  Abdomen:  Soft  nontender  Extremities:  Warm and well-perfused  Diagnostic Tests:  n/a   Impression:  Patient has rheumatic heart disease with stage D severe symptomatic mitral stenosis and mild to moderate mitral regurgitation. He describes a several month history of exertional shortness of breath that typically occurs only with more strenuous physical exertion, consistent with chronic diastolic congestive heart failure, New York Heart Association functional class I-II. He also has some atypical chest discomfort that is not related to physical exertion and occasional palpitations. Recent stress echocardiogram was associated with onset of symptoms and episodes of nonsustained VT.  I have personally reviewed the patient's recent transthoracic and transesophageal echocardiograms as well as his diagnostic cardiac catheterization and CT angiogram. Echocardiograms reveal classical evidence for severe rheumatic mitral stenosis with mild to moderate mitral regurgitation. There is severe left atrial enlargement with normal left ventricular size and systolic function. Right heart size and function appeared normal and there is trivial tricuspid regurgitation. Diagnostic cardiac catheterization confirmed the presence of severe mitral stenosis and revealed mild to moderately elevated right heart pressures. Coronary angiography reveals 70% ostial stenosis of an obtuse marginal branch of the left circumflex coronary artery but otherwise nonobstructive coronary artery disease.  I agree that it would be reasonable to consider elective mitral valve replacement in the near  future. Options include continued medical therapy with very close observation on medical therapy although the patient will remain at significant risk for progression of symptoms and/or the development of atrial fibrillation. Functional anatomy of mitral valve does not appear favorable for an attempt at balloon mitral valvuloplasty. Patient appears to be  a good candidate for minimally invasive approach for surgery although this would obviate any possibility that concomitant surgical revascularization of the obtuse marginal branch of the left circumflex could be performed at the time of surgery.   CT angiography does reveal a few small benign-appearing nodules in the right lung for which the patient should probably undergo follow-up CT imaging in approximately 1 year because of his history of longstanding tobacco use.   Plan:  The patient was again counseled at length regarding the indications, risks and potential benefits of mitral valve replacement. The rationale for elective surgery has been explained, including a comparison between surgery and continued medical therapy with close follow-up. Alternative surgical approaches have been discussed including a comparison between conventional sternotomy and minimally-invasive techniques, as well as the presence of likely asymptomatic coronary artery disease with70% stenosis of the obtuse marginal branch of the left circumflex coronary artery. The relative risks and benefits of each have been reviewed as they pertain to the patient's specific circumstances, and expectations for the patient's postoperative convalescence has been discussed including time off from work.   The patient is concerned about how long it will take before he can return to work and prefers that we proceed with minimally invasive approach in an effort to expedite his recovery.  Wediscussed the possibility of replacing the mitral valve using a mechanical prosthesis with the attendant need for long-term anticoagulation versus the alternative of replacing it using a bioprosthetic tissue valve with its potential for late structural valve deterioration and failure, depending upon the patient's longevity.  The patient now feels that he can manage long-term warfarin anticoagulation and expresses preference that we replace his valve using a  mechanical valve.  The patient understands and accepts all potential risks of surgery including but not limited to risk of death, stroke or other neurologic complication, myocardial infarction, congestive heart failure, respiratory failure, renal failure, bleeding requiring transfusion and/or reexploration, arrhythmia, infection or other wound complications, pneumonia, pleural and/or pericardial effusion, pulmonary embolus, aortic dissection or other major vascular complication, or delayed complications related to valve repair or replacement including but not limited to structural valve deterioration and failure, thrombosis, embolization, endocarditis, or paravalvular leak.  Specific risks potentially related to the minimally-invasive approach were discussed at length, including but not limited to risk of conversion to full or partial sternotomy, aortic dissection or other major vascular complication, unilateral acute lung injury or pulmonary edema, phrenic nerve dysfunction or paralysis, rib fracture, chronic pain, lung hernia, or lymphocele. All of their questions have been answered.     I spent in excess of 15 minutes during the conduct of this office consultation and >50% of this time involved direct face-to-face encounter with the patient for counseling and/or coordination of their care.   Valentina Gu. Roxy Manns, MD 03/02/2020 1:51 PM

## 2020-03-04 NOTE — H&P (Signed)
[address]       [name] [address]             [phone]          CARDIOTHORACIC SURGERY HISTORY AND PHYSICAL EXAM  Primary Cardiologist is Chilton Si, MD PCP is Chilton Si, MD      Chief Complaint  Patient presents with  . Mitral Regurgitation    Surgical consultation  . Mitral Stenosis    HPI:  Patient is a 54 year old male originally from [place] in [place] with history of rheumatic mitral valve disease and hypertension who has been referred for surgical consultation to discuss treatment options for management of severe symptomatic mitral stenosis with mitral regurgitation.  Patient states that he does not have any documented history of rheumatic fever although he recalls being very ill with bronchial infection during his teenage years.  Several years ago he was noted to have an abnormal EKG and he was referred for cardiology consultation in 2017.  He was noted to have a heart murmur on physical exam and echocardiogram at that time revealed likely rheumatic heart disease with severe mitral stenosis, moderate mitral regurgitation, and normal left ventricular systolic function.  The patient denied any symptoms at that time and remained quite active physically.  In October 2020 he was treated for pneumonia and since then he has noted the development of exertional shortness of breath.  He was referred for stress echocardiogram although transvalvular gradient was not measured during stress during the procedure.  The patient did develop chest discomfort and short runs of ventricular tachycardia during stress.  He was seen in follow-up recently by Dr. Duke Salvia who felt that further diagnostic work-up to consider elective mitral valve replacement was warranted.  TEE and diagnostic cardiac catheterization were performed December 16, 2019.  TEE revealed normal left ventricular function with ejection fraction estimated 60 to 65%.  There was severe  rheumatic mitral valve stenosis with commissural fusion, diastolic doming, moderate leaflet calcification, and mild calcification of the subvalvular apparatus.  By planimetry of the mitral valve area measured just under 1.0 cm.  There was mild mitral regurgitation.  The aortic valve appeared normal.  Right ventricular size and function was normal.  There was no significant tricuspid regurgitation.  Diagnostic cardiac catheterization confirmed the presence of severe mitral stenosis with mean transvalvular gradient measured 10 mmHg corresponding to aortic valve area calculated 1.3 cm.  There was moderately elevated pulmonary capillary wedge pressure and mild to moderately elevated right heart pressures with normal cardiac output.  There was two-vessel coronary artery disease with 70% stenosis involving ostial portion of the third obtuse marginal branch with otherwise only mild nonobstructive disease in the remaining coronary circulation.  Cardiothoracic surgical consultation was requested.  Patient is married and lives locally in [place] with his wife.  He moved from [place] to [place] in 1998.  He works full-time as a Estate agent.  He has remained physically active all of his life and up until recently has enjoyed exercise on a regular basis.  He states that over the past 6 months he has developed exertional shortness of breath that only occurs with more strenuous exertion, such as going up a flight of stairs.  He denies resting shortness of breath, PND, orthopnea, or lower extremity edema.  He does report some mild vague discomfort across his chest which seems to occur primarily when he is laying flat.  He reports occasional palpitations without dizzy spells  or syncope.  Patient was originally seen in consultation on December 23, 2019 and he was last seen here on January 13, 2020 at which time we made tentative plans for surgery.  Since then he apparently has been fully vaccinated for  COVID-19 using the Pfizer vaccine.  He returns to our office today with his cousin present for final consultation visit prior to surgery.  He reports no new problems or complaints.  He did not take his blood pressure medications this morning  Past Medical History:  Diagnosis Date  . Hypertension   . Incidental pulmonary nodule 01/08/2020   Multiple in right lung - need f/u CT 1 year due to h/o smoking  . Mild aortic stenosis   . Mitral valve stenosis, severe   . Moderate mitral regurgitation   . Pneumonia    x 1  . Rheumatic mitral stenosis with regurgitation    Echo 08/08/16:  Severe mitral stenosis (mean gradient 17 mmHg).  Moderate mitral regurgitation.  . Wears glasses   . Wears partial dentures    upper    Past Surgical History:  Procedure Laterality Date  . BILATERAL KNEE ARTHROSCOPY    . BUBBLE STUDY  12/16/2019   Procedure: BUBBLE STUDY;  Surgeon: [name], MD;  Location[facility] ENDOSCOPY;  Service: Cardiology;;  . MULTIPLE EXTRACTIONS WITH ALVEOLOPLASTY N/A 12/31/2019   Procedure: Extraction of tooth #'s 16-17, 20-23, and 28-30 with alveoloplasty, Bilateral mandibular tori reductions, and lateral exostoses reductions.;  Surgeon: Charlynne Pander, DDS;  Location: [facility] OR;  Service: Oral Surgery;  Laterality: N/A;  . RIGHT/LEFT HEART CATH AND CORONARY ANGIOGRAPHY N/A 12/16/2019   Procedure: RIGHT/LEFT HEART CATH AND CORONARY ANGIOGRAPHY;  Surgeon: Yvonne Kendall, MD;  Location: [facility] INVASIVE CV LAB;  Service: Cardiovascular;  Laterality: N/A;  . TEE WITHOUT CARDIOVERSION N/A 12/16/2019   Procedure: TRANSESOPHAGEAL ECHOCARDIOGRAM (TEE);  Surgeon: [name], MD;  Location: [facility] ENDOSCOPY;  Service: Cardiology;  Laterality: N/A;    Family History  Problem Relation Age of Onset  . Hypertension Mother   . Hypertension Father   . Stroke Father     Social History Social History   Tobacco Use  . Smoking status: Former Smoker    Packs/day: 1.00    Years: 35.00    Pack  years: 35.00    Types: Cigarettes    Quit date: 12/09/2019    Years since quitting: 0.2  . Smokeless tobacco: Never Used  Substance Use Topics  . Alcohol use: No  . Drug use: No    Prior to Admission medications   Medication Sig Start Date End Date Taking? Authorizing Provider  amLODipine (NORVASC) 10 MG tablet TAKE 1 TABLET(10 MG) BY MOUTH DAILY Patient taking differently: Take 10 mg by mouth daily.  07/30/19  Yes Chilton Si, MD  aspirin EC 81 MG tablet Take 1 tablet (81 mg total) by mouth daily. Patient not taking: Reported on 03/02/2020 12/16/19  Yes End, Cristal Deer, MD  chlorthalidone (HYGROTON) 25 MG tablet TAKE 1 TABLET(25 MG) BY MOUTH DAILY Patient taking differently: Take 25 mg by mouth daily.  07/30/19  Yes Chilton Si, MD  lisinopril (ZESTRIL) 10 MG tablet TAKE 1 TABLET(10 MG) BY MOUTH DAILY Patient taking differently: Take 10 mg by mouth daily.  08/16/19  Yes Kilroy, Luke K, PA-C    No Known Allergies    Review of Systems:              General:  normal appetite, normal energy, no weight gain, no weight loss, no fever             Cardiac:                       no chest pain with exertion, occasional chest pain at rest, +SOB with exertion, NO resting SOB, NO PND, NO orthopnea, + palpitations, no arrhythmia, no atrial fibrillation, no LE edema, no dizzy spells, no syncope             Respiratory:                 no shortness of breath, no home oxygen, no productive cough, no dry cough, no bronchitis, no wheezing, no hemoptysis, no asthma, no pain with inspiration or cough, no sleep apnea, no CPAP at night             GI:                               no difficulty swallowing, no reflux, no frequent heartburn, no hiatal hernia, no abdominal pain, no constipation, no diarrhea, no hematochezia, no hematemesis, no melena             GU:                              no dysuria,  no frequency, no urinary tract infection, no hematuria, no enlarged  prostate, no kidney stones, no kidney disease             Vascular:                     no pain suggestive of claudication, no pain in feet, no leg cramps, no varicose veins, no DVT, no non-healing foot ulcer             Neuro:                         no stroke, no TIA's, no seizures, no headaches, no temporary blindness one eye,  no slurred speech, no peripheral neuropathy, no chronic pain, no instability of gait, no memory/cognitive dysfunction             Musculoskeletal:         no arthritis, no joint swelling, no myalgias, no difficulty walking, normal mobility              Skin:                            no rash, no itching, no skin infections, no pressure sores or ulcerations             Psych:                         no anxiety, no depression, no nervousness, no unusual recent stress             Eyes:                           no blurry vision, no floaters, no recent vision changes, + wears glasses or contacts             ENT:  no hearing loss, + loose or painful teeth, no dentures, last saw dentist last fall who told him his teeth needed to be removed             Hematologic:               no easy bruising, no abnormal bleeding, no clotting disorder, no frequent epistaxis             Endocrine:                   no diabetes, does not check CBG's at home                           Physical Exam:              BP 134/88 (BP Location: Right Arm, Patient Position: Sitting, Cuff Size: Large)   Pulse 80   Temp 97.7 F (36.5 C) (Temporal)   Resp 20   Ht 5\' 9"  (1.753 m)   Wt 180 lb 12.8 oz (82 kg)   SpO2 93% Comment: RA  BMI 26.70 kg/m              General:                      Thin,  well-appearing             HEENT:                       Unremarkable              Neck:                           no JVD, no bruits, no adenopathy              Chest:                          clear to auscultation, symmetrical breath sounds, no wheezes, no rhonchi              CV:                               RRR, grade II/VI systolic murmur              Abdomen:                    soft, non-tender, no masses              Extremities:                 warm, well-perfused, pulses palpable, no LE edema             Rectal/GU                   Deferred             Neuro:                         Grossly non-focal and symmetrical throughout             Skin:  Clean and dry, no rashes, no breakdown   Diagnostic Tests:  ECHOCARDIOGRAM REPORT       Patient Name:  Leary Roca Date of Exam: 08/14/2019  Medical Rec #: [id number]      Height:    69.0 in  Accession #:  [id number]     Weight:    182.0 lb  Date of Birth: 12-Oct-1965     BSA:     1.98 m  Patient Age:  53 years      BP:      115/76 mmHg  Patient Gender: M          HR:      83 bpm.  Exam Location: [address]   Procedure: 2D Echo, Cardiac Doppler and Color Doppler   Indications:  I05.0 Mitral valve stenosis; Z01.818 Pre-operative  clearance;         I10 Hypertension;    History:    Patient has prior history of Echocardiogram examinations,  most         recent 08/10/2018. Mild aortic stenosis.    Sonographer:  Cathie Beams RCS  Referring Phys: [id number] LUKE K KILROY   IMPRESSIONS    1. Left ventricular ejection fraction, by visual estimation, is 60 to  65%. The left ventricle has normal function. There is mildly increased  left ventricular hypertrophy.  2. Left ventricular diastolic parameters are indeterminate.  3. Global right ventricle has normal systolic function.The right  ventricular size is normal. No increase in right ventricular wall  thickness.  4. Left atrial size was normal.  5. Right atrial size was normal.  6. Severe calcification of the mitral valve leaflet(s).  7. Severe thickening of the mitral valve leaflet(s).  8. Severely decreased mobility of the  mitral valve leaflets.  9. The mitral valve is rheumatic. Trace mitral valve regurgitation.  Moderate-severe mitral stenosis.  10. The tricuspid valve is normal in structure. Tricuspid valve  regurgitation is trivial.  11. The aortic valve is tricuspid. Aortic valve regurgitation is not  visualized. moderate sclerosis.  12. Small gradient across aortic valve without stenosis.  13. The pulmonic valve was grossly normal. Pulmonic valve regurgitation is  trivial.  14. The inferior vena cava is normal in size with greater than 50%  respiratory variability, suggesting right atrial pressure of 3 mmHg.   FINDINGS  Left Ventricle: Left ventricular ejection fraction, by visual estimation,  is 60 to 65%. The left ventricle has normal function. There is mildly  increased left ventricular hypertrophy. Left ventricular diastolic  parameters are indeterminate. Normal left  atrial pressure.   Right Ventricle: The right ventricular size is normal. No increase in  right ventricular wall thickness. Global RV systolic function is has  normal systolic function.   Left Atrium: Left atrial size was normal in size.   Right Atrium: Right atrial size was normal in size   Pericardium: There is no evidence of pericardial effusion.   Mitral Valve: The mitral valve is rheumatic. There is severe thickening of  the mitral valve leaflet(s). There is severe calcification of the mitral  valve leaflet(s). Severely decreased mobility of the mitral valve  leaflets. Moderate-severe mitral valve  stenosis by observation. MV peak gradient, 17.0 mmHg. Trace mitral valve  regurgitation.   Tricuspid Valve: The tricuspid valve is normal in structure. Tricuspid  valve regurgitation is trivial.   Aortic Valve: The aortic valve is tricuspid. Aortic valve regurgitation is  not visualized. Moderate sclerosis. Aortic valve mean gradient measures  8.0 mmHg.  Aortic valve peak gradient measures 16.2 mmHg. Aortic valve    area, by VTI measures 2.26 cm. Small  gradient across aortic valve without stenosis.   Pulmonic Valve: The pulmonic valve was grossly normal. Pulmonic valve  regurgitation is trivial.   Aorta: The aortic root, ascending aorta and aortic arch are all  structurally normal, with no evidence of dilitation or obstruction.   Venous: The inferior vena cava is normal in size with greater than 50%  respiratory variability, suggesting right atrial pressure of 3 mmHg.   IAS/Shunts: No atrial level shunt detected by color flow Doppler. No  ventricular septal defect is seen or detected. There is no evidence of an  atrial septal defect.      LEFT VENTRICLE  PLAX 2D  LVIDd:     3.92 cm  LVIDs:     2.29 cm  LV PW:     1.18 cm  LV IVS:    1.26 cm  LVOT diam:   2.05 cm  LV SV:     49 ml  LV SV Index:  24.16  LVOT Area:   3.30 cm     RIGHT VENTRICLE  RV Basal diam: 2.99 cm  RV S prime:   11.77 cm/s  TAPSE (M-mode): 1.8 cm   LEFT ATRIUM       Index    RIGHT ATRIUM      Index  LA diam:    5.50 cm 2.77 cm/m RA Area:   12.40 cm  LA Vol (A2C):  95.3 ml 48.01 ml/m RA Volume:  27.00 ml 13.60 ml/m  LA Vol (A4C):  88.1 ml 44.39 ml/m  LA Biplane Vol: 92.8 ml 46.75 ml/m  AORTIC VALVE  AV Area (Vmax):  2.12 cm  AV Area (Vmean):  2.09 cm  AV Area (VTI):   2.26 cm  AV Vmax:      201.00 cm/s  AV Vmean:     126.000 cm/s  AV VTI:      0.334 m  AV Peak Grad:   16.2 mmHg  AV Mean Grad:   8.0 mmHg  LVOT Vmax:     129.00 cm/s  LVOT Vmean:    79.800 cm/s  LVOT VTI:     0.229 m  LVOT/AV VTI ratio: 0.69    AORTA  Ao Root diam: 2.90 cm   MITRAL VALVE  MV Area (PHT): 1.40 cm       SHUNTS  MV Peak grad: 17.0 mmHg       Systemic VTI: 0.23 m  MV Mean grad: 8.0 mmHg       Systemic Diam: 2.05 cm  MV Vmax:    2.06 m/s  MV Vmean:   133.0 cm/s  MV VTI:    0.72 m   MV PHT:    157.18 msec  MV Decel Time: 542 msec  MV E velocity: 148.00 cm/s 103 cm/s  MV A velocity: 197.00 cm/s 70.3 cm/s  MV E/A ratio: 0.75    1.5     Charlton HawsPeter Nishan MD  Electronically signed by Charlton HawsPeter Nishan MD  Signature Date/Time: 08/14/2019/4:59:31 PM       EXERCISE STRESS REPORT       Patient Name:  Leary RocaMOHAMADOU M. Cuoco Date of Exam: 11/25/2019  Medical Rec #: [id number]      Height:    69.0 in  Accession #:  [id number]     Weight:    181.6 lb  Date of Birth: 04-Jun-1966     BSA:  1.98 m  Patient Age:  53 years      BP:      151/90 mmHg  Patient Gender: M          HR:      88 bpm.  Exam Location: [address]     Procedure: Stress Echo   Indications:  R06.00 Dyspnea    History:    Patient has prior history of Echocardiogram examinations,  most         recent 08/14/2019. Mitral valve stenosis. Mitral  regurgitation.         Rheumatic mitral valve. Mild aortic stenosis. LVH.    Sonographer:  Cathie Beams RCS  Referring Phys: [id number] [facility]   Transthoracic Stress Echo for Dyspnea evaluation.   Stress Protocol: The patient exercised on a treadmill for 10 minutes  minutes and 30 seconds to stage IV of a Bruce protocol, achieving 11 METS.  The patient exercised on a treadmill according to a Bruce protocol.   Protocol Termination:    Resting HR:   88      bpm Resting BP: 151/90  Target HR:   165     bpm Peak BP:   211/93  Peak HR:    173     bpm BP Response: hypertensive  % of Target HR: 103     bpm  HR Achieved:  was achieved   Patient Tolerance: The patient developed Chest tightness during the stress  exam. The symptoms resolved with rest.   EKG: The patient developed PVCs, Multifocal V cuplets, Short run Vtach,  PACs during exercise.   LV Pre-Stress Systolic Function: Pre-stress testing the left  ventricular  systolic function was normal systolic function with a 60-65% ejection  fraction.   LV Post-Stress Systolic Function: Post-stress testing, the left  ventricular systolic function was hyperdynamic systolic function with a  >65% ejection fraction.         HR SysBP DiasBP  Symptoms  Stage I  123 189  79  Stage II 150 212  91  Chest tightness  Stage III 164 211  93  Stage IV 173   RECOVERY      Minutes      HR SysBP DiasBP Symptoms  Immediately following Stress 153 180  80  PACs  2 minutes          129 133  75  4 minutes          116  6 minutes          111 107  63  8 minutes          106  10 minutes            98   63   IMPRESSIONS    1. Left ventricular ejection fraction, by estimation, is 60 to 65%. The  left ventricle has normal function. The left ventricle has no regional  wall motion abnormalities. There is mild left ventricular hypertrophy.  Left ventricular diastolic function  could not be evaluated.  2. Right ventricular systolic function is normal. The right ventricular  size is normal.  3. The mitral valve was not well visualized. No doppler mitral valve  regurgitation.  4. Tricuspid valve regurgitation No doppler.  5. The aortic valve was not well visualized. Aortic valve regurgitation  No doppler.  6. Post-stress: left ventricular systolic function was <65%.  7. Pre-stress: left ventricular systolic function was 60-65%.  8. Conclusion: Exercise ECG showed nonspecific ST changes, but PVCs  were  noted with stress. The patient reported chest pain. There was no evidence  for exercise-induced wall motion abnormalities by echo imaging. No  evidence for ischemia.   FINDINGS  Left Ventricle: Left ventricular ejection fraction, by estimation, is 60  to 65%. The left ventricle has normal function. The left ventricle has no  regional wall motion  abnormalities. There is mild. There is mild left  ventricular hypertrophy.   Right Ventricle: The right ventricular size is normal. No increase in  right ventricular wall thickness. Right ventricular systolic function is  normal.   Left Atrium: Left atrial size was not well visualized.   Right Atrium: Right atrial size was not well visualized.   Pericardium: There is no evidence of pericardial effusion.   Mitral Valve: The mitral valve was not well visualized. No doppler mitral  valve regurgitation.   Tricuspid Valve: The tricuspid valve is not well visualized. Tricuspid  valve regurgitation No doppler.   Aortic Valve: The aortic valve was not well visualized. Aortic valve  regurgitation No doppler.   Pulmonic Valve: The pulmonic valve was not well visualized. Pulmonic valve  regurgitation is not visualized.   Aorta: The aortic root was not well visualized.   Shunts: The interatrial septum was not well visualized.     Marca Ancona MD  Electronically signed by Marca Ancona MD  Signature Date/Time: 11/25/2019/9:01:30 PM        TRANSESOPHOGEAL ECHO REPORT       Patient Name:  Leary Roca Date of Exam: 12/16/2019  Medical Rec #: [id number]      Height:    69.0 in  Accession #:  [id number]     Weight:    184.2 lb  Date of Birth: June 13, 1966     BSA:     1.995 m  Patient Age:  53 years      BP:      131/80 mmHg  Patient Gender: M          HR:      78 bpm.  Exam Location: Inpatient   Procedure: Transesophageal Echo                 MODIFIED REPORT:  This report was modified by Weston Brass MD on 12/19/2019 due to  amendment.  Indications:   I05.0 Rheumatic mitral stenosis    History:     Patient has prior history of Echocardiogram examinations.          Mitral Valve Disease.    Sonographer:   Margreta Journey  Referring Phys: [id number] [facility] [place]    Diagnosing Phys: Weston Brass MD   PROCEDURE: After discussion of the risks and benefits of a TEE, an  informed consent was obtained from the patient. The transesophogeal probe  was passed without difficulty through the esophogus of the patient. Local  oropharyngeal anesthetic was provided  with Cetacaine. Sedation performed by different physician. The patient was  monitored while under deep sedation. The patient's vital signs; including  heart rate, blood pressure, and oxygen saturation; remained stable  throughout the procedure. The patient  developed no complications during the procedure.   IMPRESSIONS    1. Left ventricular ejection fraction, by estimation, is 60 to 65%. The  left ventricle has normal function. The left ventricle has no regional  wall motion abnormalities.  2. Right ventricular systolic function is normal. The right ventricular  size is normal. moderately increased right ventricular wall thickness.  3. Left atrial size was severely dilated. No  left atrial/left atrial  appendage thrombus was detected.  4. Right atrial size was mildly dilated.  5. Severe, rheumatic mitral valve stenosis. Commisural fusion. Diastolic  doming particularly of anterior mitral leaflet. Moderate leaflet  calcifications. Mild calcification of the submitral apparatus, somewhat  obscured by shadowing. Variable cycle  length from ectopy impacts quantitation. 3D planimetry MVA = 1 cm2. MVA by  PHT with regular R-R = 1.50 cm2. MVA by PISA using a radius of 1 cm and  angle of 120 deg = 0.9 cm2. MVA by continuity equation = 1.29 cm2. Mean  gradient 6 mmHg at HR of 71 bpm.  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  The mitral valve is abnormal. Mild mitral valve regurgitation. Severe  mitral stenosis.  6. The aortic valve is grossly normal. Aortic valve regurgitation is  trivial.  7. Agitated saline contrast bubble study was negative, with no evidence   of any interatrial shunt.   FINDINGS  Left Ventricle: Left ventricular ejection fraction, by estimation, is 60  to 65%. The left ventricle has normal function. The left ventricle has no  regional wall motion abnormalities. The left ventricular internal cavity  size was normal in size. There is  no left ventricular hypertrophy.   Right Ventricle: The right ventricular size is normal. Moderately  increased right ventricular wall thickness. Right ventricular systolic  function is normal.   Left Atrium: No LA mural thrombus or LAA thrombus noted. Left atrial size  was severely dilated. No left atrial/left atrial appendage thrombus was  detected.   Right Atrium: Right atrial size was mildly dilated. Prominent Eustachian  valve.   Pericardium: There is no evidence of pericardial effusion.   Mitral Valve: Severe, rheumatic mitral valve stenosis. Commisural fusion.  Diastolic doming particularly of anterior mitral leaflet. Moderate leaflet  calcifications. Mild calcification of the submitral apparatus, somewhat  obscured by shadowing. Variable  cycle length from ectopy impacts quantitation. 3D planimetry MVA = 1 cm2.  MVA by PHT with regular R-R = 1.50 cm2. MVA by PISA using a radius of 1 cm  and angle of 120 deg = 0.9 cm2. MVA by continuity equation = 1.29 cm2.  Mean gradient 6 mmHg at HR of 71  bpm. These findings in combination suggest severe rheumatic mitral  stenosis. The mitral valve is abnormal. There is moderate thickening of  the mitral valve leaflet(s). There is moderate calcification of the mitral  valve leaflet(s). Severely decreased  mobility of the mitral valve leaflets. Mild mitral valve regurgitation.  Severe mitral valve stenosis. MV peak gradient, 13.0 mmHg. The mean mitral  valve gradient is 6.0 mmHg.   Tricuspid Valve: The tricuspid valve is normal in structure. Tricuspid  valve regurgitation is trivial.   Aortic Valve: The aortic valve is grossly normal.  Aortic valve  regurgitation is trivial. There is mild calcification of the aortic valve.   Pulmonic Valve: The pulmonic valve was normal in structure. Pulmonic valve  regurgitation is trivial.   Aorta: The aortic root and ascending aorta are structurally normal, with  no evidence of dilitation.   IAS/Shunts: No atrial level shunt detected by color flow Doppler. Agitated  saline contrast was given intravenously to evaluate for intracardiac  shunting. Agitated saline contrast bubble study was negative, with no  evidence of any interatrial shunt. There  is no evidence of a patent foramen ovale.     LEFT VENTRICLE  PLAX 2D  LVOT diam:   2.10 cm  LV SV:  69  LV SV Index:  35  LVOT Area:   3.46 cm     AORTIC VALVE  LVOT Vmax:  125.00 cm/s  LVOT Vmean: 76.900 cm/s  LVOT VTI:  0.199 m    AORTA  Ao Sinus diam: 3.10 cm   MITRAL VALVE  MV Area (PHT): 1.50 cm  SHUNTS  MV Peak grad: 13.0 mmHg Systemic VTI: 0.20 m  MV Mean grad: 6.0 mmHg  Systemic Diam: 2.10 cm  MV Vmax:    1.80 m/s  MV Vmean:   114.0 cm/s   Weston Brass MD  Electronically signed by Weston Brass MD  Signature Date/Time: 12/17/2019/6:47:54 AM        RIGHT/LEFT HEART CATH AND CORONARY ANGIOGRAPHY     Conclusion Conclusions:  1. Two vessel coronary artery disease with 70% stenosis involving ostium of OM3. There is mild disease involving the LAD and distal LCx. 2. Severe mitral stenosis (mean gradient 10 mmHg, valve area 1.3 cm^2). 3. Moderately elevated PCWP and normal LVEDP, consistent with mitral stenosis. 4. Mildly to moderately elevated right heart and pulmonary artery pressures. 5. Normal Fick cardiac output/index. Recommendations:  1. Ongoing evaluation and management of rheumatic heart disease with severe mitral stenosis per Dr. Duke Salvia. 2. Start aspirin 81 mg daily. 3. Optimize medical therapy and risk factor modification for coronary artery disease.  Consider addition of statin therapy when the patient is seen for follow-up. Defer PCI to [facility] pending further workup of valvular heart disease. Yvonne Kendall, MD  [facility]      Recommendations Antiplatelet/Anticoag Recommend Aspirin 81mg  daily for moderate CAD.   Discharge Date In the absence of any other complications or medical issues, we expect the patient to be ready for discharge from a cath perspective on 12/16/2019.     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 year-old man with history of rheumatic heart disease with moderate to severe mitral stenosis, presenting for evaluation of dyspnea on exertion.  GFR: >60 ml/min  Procedure: The risks, benefits, complications, treatment options, and expected outcomes were discussed with the patient. The patient and/or family concurred with the proposed plan, giving informed consent. The patient was sedated with IV midazolam and fentanyl. The right wrist was assessed with a modified Allens test which was normal. The right wrist and elbow were prepped and draped in a sterile fashion. 1% lidocaine was used for local anesthesia. A previously placed antecubital vein IV was exchanged for a 101F slender Glidesheath using modified Seldinger technique. Right heart catheterization was performed by advancing a 101F balloon-tipped catheter through the right heart chambers into the pulmonary capillary wedge position. Pressure measurements and oxygen saturations were obtained.  Using the modified Seldinger access technique, a 81F slender Glidesheath was placed in the right radial artery. 3 mg Verapamil was given through the sheath. Heparin 4,000 units were administered. Selective coronary angiography was performed using 101F JL3.5 and JR4 catheters to engage the left and right coronary  arteries, respectively. Left heart catheterization was performed using a 101F angled pigtail catheter. Simultaneous LV and PCWP pressures were obtained to calculate a transmitral gradient. Left ventriculogram was not performed.  At the end of the procedure, the radial artery sheath was removed and a TR band applied to achieve patent hemostasis. There were no immediate complications. The patient was taken to the recovery area in  stable condition.  Estimated blood loss <50 mL.   During this procedure medications were administered to achieve and maintain moderate conscious sedation while the patient's heart rate, blood pressure, and oxygen saturation were continuously monitored and I was present face-to-face 100% of this time.     Medications (Filter: Administrations occurring from 12/16/19 1355 to 12/16/19 1510)  Heparin (Porcine) in NaCl 1000-0.9 UT/500ML-% SOLN (mL) Total volume: 1,000 mL  Date/Time   Rate/Dose/Volume Action  12/16/19 1418  500 mL Given  1418  500 mL Given  lidocaine (PF) (XYLOCAINE) 1 % injection (mL) Total volume: 4 mL  Date/Time   Rate/Dose/Volume Action  12/16/19 1420  2 mL Given  1431  2 mL Given  fentaNYL (SUBLIMAZE) injection (mcg) Total dose: 25 mcg  Date/Time   Rate/Dose/Volume Action  12/16/19 1420  25 mcg Given  midazolam (VERSED) injection (mg) Total dose: 1 mg  Date/Time   Rate/Dose/Volume Action  12/16/19 1421  1 mg Given  Radial Cocktail/Verapamil only (mL) Total volume: 10 mL  Date/Time   Rate/Dose/Volume Action  12/16/19 1432  10 mL Given  heparin injection (Units) Total dose: 4,000 Units  Date/Time   Rate/Dose/Volume Action  12/16/19 1433  4,000 Units Given  iohexol (OMNIPAQUE) 350 MG/ML injection (mL) Total volume: 35 mL  Date/Time   Rate/Dose/Volume Action  12/16/19 1458  35 mL Given     Sedation Time Sedation Time Physician-1: 33 minutes 11 seconds        Contrast Medication Name Total Dose  iohexol  (OMNIPAQUE) 350 MG/ML injection 35 mL     Radiation/Fluoro Fluoro time: 6.3 (min)  DAP: 21185 (mGycm2)  Cumulative Air Kerma: 412 (mGy)     Complications Complications documented before study signed (12/16/2019 3:44 PM)  No complications were associated with this study.  Documented by Yvonne Kendall, MD - 12/16/2019 3:38 PM     Coronary Findings Diagnostic Dominance: Right  Left Main  Vessel is large. Vessel is angiographically normal.   Left Anterior Descending  Vessel is large.  Prox LAD lesion 20% stenosed  Prox LAD lesion is 20% stenosed. The lesion is eccentric.  Mid LAD lesion 20% stenosed  Mid LAD lesion is 20% stenosed. The lesion is eccentric. The lesion is moderately calcified.   First Diagonal Branch  Vessel is small in size.   Second Diagonal Branch  Vessel is small in size.   Third Diagonal Branch  Vessel is small in size.   Left Circumflex  Vessel is large.  Dist Cx lesion 30% stenosed  Dist Cx lesion is 30% stenosed.   First Obtuse Marginal Branch  Vessel is small in size.   Second Obtuse Marginal Branch  Vessel is small in size.   Third Obtuse Marginal Branch  Vessel is moderate in size.  3rd Mrg lesion 70% stenosed  3rd Mrg lesion is 70% stenosed.   Fourth Obtuse Marginal Branch  Vessel is moderate in size.   Right Coronary Artery  Vessel is large. Vessel is angiographically normal.   Right Posterior Descending Artery  Vessel is moderate in size.   Right Posterior Atrioventricular Artery  Vessel is small in size.   First Right Posterolateral Branch  Vessel is small in size.   Second Right Posterolateral Branch  Vessel is small in size.  Intervention No interventions have been documented.          Right Heart Right Heart Pressures RA (mean): 10 mmHg RV (S/EDP): 53/10 mmHg PA (S/D, mean): 53/28 (36) mmHg PCWP (mean): 25 mmHg  Ao sat: 98% PA sat: 71%  Fick CO: 5.1 L/min Fick CI: 2.6 L/min/m^2                   Left Heart Left Ventricle LV end diastolic pressure is normal. LVEDP 10 mmHg.   Mitral Valve There is severe mitral valve stenosis. Mean gradient 10 mmHg. Valve area 1.3 cm^2.   Aortic Valve There is no aortic valve stenosis.     Coronary Diagrams Diagnostic Dominance: Right  &&&&&  Intervention      Implants  No implant documentation for this case.      Syngo Images Link to Procedure Log  Show images for CARDIAC CATHETERIZATION Procedure Log     Images on Long Term Storage   Show images for Mccollom, Justn M.         Hemo Data   Most Recent Value  Fick Cardiac Output 5.14 L/min  Fick Cardiac Output Index 2.57 (L/min)/BSA  Mitral Mean Gradient 9.96 mmHg  Mitral Peak Gradient 9 mmHg  Mitral Valve Area Index 0.65 cm2/BSA  RA A Wave 14 mmHg  RA V Wave 9 mmHg  RA Mean 8 mmHg  RV Systolic Pressure 43 mmHg  RV Diastolic Pressure 0 mmHg  RV EDP 9 mmHg  PA Systolic Pressure 51 mmHg  PA Diastolic Pressure 27 mmHg  PA Mean 34 mmHg  PW A Wave 23 mmHg  PW V Wave 19 mmHg  PW Mean 16 mmHg  AO Systolic Pressure 105 mmHg  AO Diastolic Pressure 57 mmHg  AO Mean 77 mmHg  LV Systolic Pressure 105 mmHg  LV Diastolic Pressure 2 mmHg  LV EDP 7 mmHg  AOp Systolic Pressure 110 mmHg  AOp Diastolic Pressure 65 mmHg  AOp Mean Pressure 84 mmHg  LVp Systolic Pressure 101 mmHg  LVp Diastolic Pressure 1 mmHg  LVp EDP Pressure 6 mmHg  QP/QS 1  TPVR Index 13.23 HRUI  TSVR Index 29.95 HRUI  PVR SVR Ratio 0.29  TPVR/TSVR Ratio 0.44      CT ANGIOGRAPHY CHEST, ABDOMEN AND PELVIS  TECHNIQUE: Multidetector CT imaging through the chest, abdomen and pelvis was performed using the standard protocol during bolus administration of intravenous contrast. Multiplanar reconstructed images and MIPs were obtained and reviewed to evaluate the vascular anatomy.  CONTRAST: 75mL ISOVUE-370 IOPAMIDOL (ISOVUE-370) INJECTION  76%  COMPARISON: None.  FINDINGS: CTA CHEST FINDINGS  Cardiovascular: Heart size normal. No pericardial effusion. Incomplete opacification of the pulmonary arterial tree; the exam was not optimized for detection of pulmonary emboli. Left atrial enlargement. Coronary calcifications. Good contrast opacification of the thoracic aorta; no aneurysm, dissection, or stenosis. Classic 3 vessel brachiocephalic arterial origin anatomy without proximal stenosis. No significant atheromatous change.  Mediastinum/Nodes: No hilar or mediastinal adenopathy.  Lungs/Pleura: No pleural effusion. No pneumothorax. 3 nodules measuring 3-6 mm, abutting the minor fissure, possibly intrapulmonary lymph nodes but nonspecific. emphysematous changes in the upper lobes. Lungs otherwise clear.  Musculoskeletal: No chest wall abnormality. No acute or significant osseous findings.  Review of the MIP images confirms the above findings.  CTA ABDOMEN AND PELVIS FINDINGS  VASCULAR  Aorta: Normal caliber aorta without aneurysm, dissection, vasculitis or significant stenosis. Minimal atheromatous calcified plaque in the infrarenal segment.  Celiac: Patent without evidence of aneurysm, dissection, vasculitis or significant stenosis.  SMA: Patent without evidence of aneurysm, dissection, vasculitis or significant stenosis.  Renals: Duplicated left, inferior dominant, both patent.  Duplicated right, inferior dominant, both patent.  IMA: Patent without evidence of aneurysm, dissection, vasculitis  or significant stenosis.  Inflow: Minimal calcified atheromatous plaque in bilateral common and internal iliac arteries. No significant stenosis, aneurysm, or dissection. External iliac arteries widely patent. Visualized proximal lower extremity arterial outflow minimally atheromatous, patent.  Veins: No obvious venous abnormality within the limitations of this arterial phase study.  Review  of the MIP images confirms the above findings.  NON-VASCULAR  Hepatobiliary: No focal liver abnormality is seen. No gallstones, gallbladder wall thickening, or biliary dilatation.  Pancreas: Unremarkable. No pancreatic ductal dilatation or surrounding inflammatory changes.  Spleen: Normal in size without focal abnormality.  Adrenals/Urinary Tract: Adrenal glands unremarkable. Kidneys enhance normally, without hydronephrosis. Urinary bladder physiologically distended.  Stomach/Bowel: Stomach incompletely distended. Small bowel is decompressed. Normal appendix. The colon is nondilated, unremarkable.  Lymphatic: No abdominal or pelvic adenopathy.  Reproductive: Prostate is unremarkable.  Other: No ascites. No free air.  Musculoskeletal: No acute or significant osseous findings.  Review of the MIP images confirms the above findings.  IMPRESSION: 1. Minimal aortoiliac atherosclerosis (ICD10-170.0), without dissection or other acute findings. 2. Coronary calcifications. The severity of this disease and any potential stenosis cannot be assessed on this non-gated CT examination. 3. Small right lung nodules. No follow-up needed if patient is low-risk (and has no known or suspected primary neoplasm). Non-contrast chest CT can be considered in 12 months if patient is high-risk. This recommendation follows the consensus statement: Guidelines for Management of Incidental Pulmonary Nodules Detected on CT Images: From the Fleischner Society 2017; Radiology 2017; 284:228-243.   Electronically Signed By: Corlis Leak M.D. On: 01/08/2020 14:47  Impression:  Patient has rheumatic heart disease with stage D severe symptomatic mitral stenosis and mild to moderate mitral regurgitation. He describes a several month history of exertional shortness of breath that typically occurs only with more strenuous physical exertion, consistent with chronic diastolic congestive  heart failure, New York Heart Association functional class I-II. He also has some atypical chest discomfort that is not related to physical exertion and occasional palpitations. Recent stress echocardiogram was associated with onset of symptoms and episodes of nonsustained VT.  I have personally reviewed the patient's recent transthoracic and transesophageal echocardiograms as well as his diagnostic cardiac catheterizationand CT angiogram. Echocardiograms reveal classical evidence for severe rheumatic mitral stenosis with mild to moderate mitral regurgitation. There is severe left atrial enlargement with normal left ventricular size and systolic function. Right heart size and function appeared normal and there is trivial tricuspid regurgitation. Diagnostic cardiac catheterization confirmed the presence of severe mitral stenosis and revealed mild to moderately elevated right heart pressures. Coronary angiography reveals 70% ostial stenosis of an obtuse marginal branch of the left circumflex coronary artery but otherwise nonobstructive coronary artery disease.  I agree that it would be reasonable to consider elective mitral valve replacement in the near future. Options include continued medical therapy with very close observation on medical therapy although the patient will remain at significant risk for progression of symptoms and/or the development of atrial fibrillation. Functional anatomy of mitral valve does not appear favorable for an attempt at balloon mitral valvuloplasty. Patientappears tobe a good candidate for minimally invasive approach for surgery although this would obviate any possibility that concomitant surgical revascularization of the obtuse marginal branch of the left circumflex could be performed at the time of surgery. CT angiography does reveal a few small benign-appearing nodules in the right lung for which the patient should probably undergo follow-up CT imaging in  approximately 1 year because of his history of longstanding tobacco use.  Plan:  The patient wasagaincounseled at length regarding the indications, risks and potential benefits of mitral valve replacement. The rationale for elective surgery has been explained, including a comparison between surgery and continued medical therapy with close follow-up. Alternative surgical approaches have been discussed including a comparison between conventional sternotomy and minimally-invasive techniques, as well as the presence of likely asymptomatic coronary artery disease with70% stenosis of the obtuse marginal branch of the left circumflex coronary artery. The relative risks and benefits of each have been reviewed as they pertain to the patient's specific circumstances, and expectations for the patient's postoperative convalescence has been discussedincluding time off from work.The patient is concerned about how long it will take before he can return to work and prefers that we proceed with minimally invasive approach in an effort to expedite his recovery. Wediscussed the possibility of replacing the mitral valve using a mechanical prosthesis with the attendant need for long-term anticoagulation versus the alternative of replacing it using a bioprosthetic tissue valve with its potential for late structural valve deterioration and failure, depending upon the patient's longevity.The patient now feels that he can manage long-term warfarin anticoagulation and expresses preference that we replace his valve using a mechanical valve.  The patient understands and accepts all potential risks of surgery including but not limited to risk of death, stroke or other neurologic complication, myocardial infarction, congestive heart failure, respiratory failure, renal failure, bleeding requiring transfusion and/or reexploration, arrhythmia, infection or other wound complications, pneumonia, pleural and/or pericardial  effusion, pulmonary embolus, aortic dissection or other major vascular complication, or delayed complications related to valve repair or replacement including but not limited to structural valve deterioration and failure, thrombosis, embolization, endocarditis, or paravalvular leak.  Specific risks potentially related to the minimally-invasive approach were discussed at length, including but not limited to risk of conversion to full or partial sternotomy, aortic dissection or other major vascular complication, unilateral acute lung injury or pulmonary edema, phrenic nerve dysfunction or paralysis, rib fracture, chronic pain, lung hernia, or lymphocele. All of their questions have been answered.      Salvatore Decent. [name], MD 03/02/2020 1:51 PM

## 2020-03-05 MED ORDER — SODIUM CHLORIDE 0.9 % IV SOLN
750.0000 mg | INTRAVENOUS | Status: AC
  Administered 2020-03-06: 750 mg via INTRAVENOUS
  Filled 2020-03-05: qty 750

## 2020-03-05 MED ORDER — VANCOMYCIN HCL 1250 MG/250ML IV SOLN
1250.0000 mg | INTRAVENOUS | Status: AC
  Administered 2020-03-06: 1250 mg via INTRAVENOUS
  Filled 2020-03-05: qty 250

## 2020-03-05 MED ORDER — INSULIN REGULAR(HUMAN) IN NACL 100-0.9 UT/100ML-% IV SOLN
INTRAVENOUS | Status: DC
  Filled 2020-03-05: qty 100

## 2020-03-05 MED ORDER — TRANEXAMIC ACID 1000 MG/10ML IV SOLN
1.5000 mg/kg/h | INTRAVENOUS | Status: DC
  Filled 2020-03-05: qty 25

## 2020-03-05 MED ORDER — POTASSIUM CHLORIDE 2 MEQ/ML IV SOLN
80.0000 meq | INTRAVENOUS | Status: DC
  Filled 2020-03-05: qty 40

## 2020-03-05 MED ORDER — DEXMEDETOMIDINE HCL IN NACL 400 MCG/100ML IV SOLN
0.1000 ug/kg/h | INTRAVENOUS | Status: DC
  Filled 2020-03-05: qty 100

## 2020-03-05 MED ORDER — MANNITOL 20 % IV SOLN
INTRAVENOUS | Status: DC
  Filled 2020-03-05: qty 13

## 2020-03-05 MED ORDER — NOREPINEPHRINE 4 MG/250ML-% IV SOLN
0.0000 ug/min | INTRAVENOUS | Status: DC
  Filled 2020-03-05: qty 250

## 2020-03-05 MED ORDER — PLASMA-LYTE 148 IV SOLN
INTRAVENOUS | Status: DC
  Filled 2020-03-05: qty 2.5

## 2020-03-05 MED ORDER — MILRINONE LACTATE IN DEXTROSE 20-5 MG/100ML-% IV SOLN
0.3000 ug/kg/min | INTRAVENOUS | Status: DC
  Filled 2020-03-05: qty 100

## 2020-03-05 MED ORDER — NITROGLYCERIN IN D5W 200-5 MCG/ML-% IV SOLN
2.0000 ug/min | INTRAVENOUS | Status: DC
  Filled 2020-03-05: qty 250

## 2020-03-05 MED ORDER — EPINEPHRINE HCL 5 MG/250ML IV SOLN IN NS
0.0000 ug/min | INTRAVENOUS | Status: DC
  Filled 2020-03-05: qty 250

## 2020-03-05 MED ORDER — SODIUM CHLORIDE 0.9 % IV SOLN
1.5000 g | INTRAVENOUS | Status: AC
  Administered 2020-03-06: 1.5 g via INTRAVENOUS
  Filled 2020-03-05 (×2): qty 1.5

## 2020-03-05 MED ORDER — SODIUM CHLORIDE 0.9 % IV SOLN
INTRAVENOUS | Status: DC
  Filled 2020-03-05: qty 30

## 2020-03-05 MED ORDER — VANCOMYCIN HCL 1000 MG IV SOLR
INTRAVENOUS | Status: DC
  Filled 2020-03-05: qty 1000

## 2020-03-05 MED ORDER — TRANEXAMIC ACID (OHS) BOLUS VIA INFUSION
15.0000 mg/kg | INTRAVENOUS | Status: AC
  Administered 2020-03-06: 1239 mg via INTRAVENOUS
  Filled 2020-03-05: qty 1239

## 2020-03-05 MED ORDER — PHENYLEPHRINE HCL-NACL 20-0.9 MG/250ML-% IV SOLN
30.0000 ug/min | INTRAVENOUS | Status: DC
  Filled 2020-03-05: qty 250

## 2020-03-05 MED ORDER — TRANEXAMIC ACID (OHS) PUMP PRIME SOLUTION
2.0000 mg/kg | INTRAVENOUS | Status: DC
  Filled 2020-03-05: qty 1.65

## 2020-03-06 ENCOUNTER — Inpatient Hospital Stay (HOSPITAL_COMMUNITY)
Admission: RE | Admit: 2020-03-06 | Discharge: 2020-03-18 | DRG: 220 | Disposition: A | Discharge status: Home Health | Payer: No Typology Code available for payment source | Attending: Thoracic Surgery (Cardiothoracic Vascular Surgery) | Admitting: Thoracic Surgery (Cardiothoracic Vascular Surgery)

## 2020-03-06 ENCOUNTER — Inpatient Hospital Stay (HOSPITAL_COMMUNITY): Payer: No Typology Code available for payment source

## 2020-03-06 ENCOUNTER — Encounter (HOSPITAL_COMMUNITY): Payer: Self-pay | Admitting: Thoracic Surgery (Cardiothoracic Vascular Surgery)

## 2020-03-06 ENCOUNTER — Encounter (HOSPITAL_COMMUNITY)
Admission: RE | Disposition: A | Discharge status: Home Health | Payer: Self-pay | Source: Home / Self Care | Attending: Thoracic Surgery (Cardiothoracic Vascular Surgery)

## 2020-03-06 ENCOUNTER — Other Ambulatory Visit: Payer: Self-pay

## 2020-03-06 ENCOUNTER — Inpatient Hospital Stay (HOSPITAL_COMMUNITY): Payer: No Typology Code available for payment source | Admitting: Certified Registered Nurse Anesthetist

## 2020-03-06 ENCOUNTER — Inpatient Hospital Stay (HOSPITAL_COMMUNITY): Payer: No Typology Code available for payment source | Admitting: Emergency Medicine

## 2020-03-06 DIAGNOSIS — Z1639 Resistance to other specified antimicrobial drug: Secondary | ICD-10-CM | POA: Diagnosis present

## 2020-03-06 DIAGNOSIS — I5032 Chronic diastolic (congestive) heart failure: Secondary | ICD-10-CM | POA: Diagnosis present

## 2020-03-06 DIAGNOSIS — E8770 Fluid overload, unspecified: Secondary | ICD-10-CM | POA: Diagnosis not present

## 2020-03-06 DIAGNOSIS — Z87891 Personal history of nicotine dependence: Secondary | ICD-10-CM

## 2020-03-06 DIAGNOSIS — I480 Paroxysmal atrial fibrillation: Secondary | ICD-10-CM | POA: Diagnosis not present

## 2020-03-06 DIAGNOSIS — L089 Local infection of the skin and subcutaneous tissue, unspecified: Secondary | ICD-10-CM

## 2020-03-06 DIAGNOSIS — I050 Rheumatic mitral stenosis: Secondary | ICD-10-CM

## 2020-03-06 DIAGNOSIS — Z954 Presence of other heart-valve replacement: Secondary | ICD-10-CM

## 2020-03-06 DIAGNOSIS — I080 Rheumatic disorders of both mitral and aortic valves: Secondary | ICD-10-CM | POA: Diagnosis present

## 2020-03-06 DIAGNOSIS — Z09 Encounter for follow-up examination after completed treatment for conditions other than malignant neoplasm: Secondary | ICD-10-CM

## 2020-03-06 DIAGNOSIS — I340 Nonrheumatic mitral (valve) insufficiency: Secondary | ICD-10-CM | POA: Diagnosis present

## 2020-03-06 DIAGNOSIS — Z8249 Family history of ischemic heart disease and other diseases of the circulatory system: Secondary | ICD-10-CM

## 2020-03-06 DIAGNOSIS — J90 Pleural effusion, not elsewhere classified: Secondary | ICD-10-CM

## 2020-03-06 DIAGNOSIS — Z79899 Other long term (current) drug therapy: Secondary | ICD-10-CM | POA: Diagnosis not present

## 2020-03-06 DIAGNOSIS — I808 Phlebitis and thrombophlebitis of other sites: Secondary | ICD-10-CM | POA: Diagnosis not present

## 2020-03-06 DIAGNOSIS — B9689 Other specified bacterial agents as the cause of diseases classified elsewhere: Secondary | ICD-10-CM

## 2020-03-06 DIAGNOSIS — Y838 Other surgical procedures as the cause of abnormal reaction of the patient, or of later complication, without mention of misadventure at the time of the procedure: Secondary | ICD-10-CM | POA: Diagnosis not present

## 2020-03-06 DIAGNOSIS — I4892 Unspecified atrial flutter: Secondary | ICD-10-CM | POA: Diagnosis not present

## 2020-03-06 DIAGNOSIS — Z952 Presence of prosthetic heart valve: Secondary | ICD-10-CM

## 2020-03-06 DIAGNOSIS — R7881 Bacteremia: Secondary | ICD-10-CM | POA: Diagnosis not present

## 2020-03-06 DIAGNOSIS — D62 Acute posthemorrhagic anemia: Secondary | ICD-10-CM | POA: Diagnosis not present

## 2020-03-06 DIAGNOSIS — L03113 Cellulitis of right upper limb: Secondary | ICD-10-CM | POA: Diagnosis not present

## 2020-03-06 DIAGNOSIS — Z4682 Encounter for fitting and adjustment of non-vascular catheter: Secondary | ICD-10-CM

## 2020-03-06 DIAGNOSIS — T827XXA Infection and inflammatory reaction due to other cardiac and vascular devices, implants and grafts, initial encounter: Secondary | ICD-10-CM | POA: Diagnosis not present

## 2020-03-06 DIAGNOSIS — E876 Hypokalemia: Secondary | ICD-10-CM | POA: Diagnosis not present

## 2020-03-06 DIAGNOSIS — I2510 Atherosclerotic heart disease of native coronary artery without angina pectoris: Secondary | ICD-10-CM | POA: Diagnosis present

## 2020-03-06 DIAGNOSIS — Z823 Family history of stroke: Secondary | ICD-10-CM

## 2020-03-06 DIAGNOSIS — I052 Rheumatic mitral stenosis with insufficiency: Secondary | ICD-10-CM | POA: Diagnosis present

## 2020-03-06 DIAGNOSIS — J9811 Atelectasis: Secondary | ICD-10-CM | POA: Diagnosis not present

## 2020-03-06 DIAGNOSIS — T827XXD Infection and inflammatory reaction due to other cardiac and vascular devices, implants and grafts, subsequent encounter: Secondary | ICD-10-CM | POA: Diagnosis not present

## 2020-03-06 DIAGNOSIS — I440 Atrioventricular block, first degree: Secondary | ICD-10-CM | POA: Diagnosis not present

## 2020-03-06 DIAGNOSIS — I110 Hypertensive heart disease with heart failure: Secondary | ICD-10-CM | POA: Diagnosis present

## 2020-03-06 DIAGNOSIS — I10 Essential (primary) hypertension: Secondary | ICD-10-CM | POA: Diagnosis present

## 2020-03-06 HISTORY — PX: MITRAL VALVE REPLACEMENT: SHX147

## 2020-03-06 HISTORY — PX: TEE WITHOUT CARDIOVERSION: SHX5443

## 2020-03-06 HISTORY — DX: Presence of other heart-valve replacement: Z95.4

## 2020-03-06 LAB — POCT I-STAT 7, (LYTES, BLD GAS, ICA,H+H)
Acid-base deficit: 1 mmol/L (ref 0.0–2.0)
Acid-base deficit: 2 mmol/L (ref 0.0–2.0)
Bicarbonate: 25.8 mmol/L (ref 20.0–28.0)
Bicarbonate: 24.7 mmol/L (ref 20.0–28.0)
Calcium, Ion: 1.41 mmol/L — ABNORMAL HIGH (ref 1.15–1.40)
Calcium, Ion: 1.35 mmol/L (ref 1.15–1.40)
HCT: 33 % — ABNORMAL LOW (ref 39.0–52.0)
HCT: 34 % — ABNORMAL LOW (ref 39.0–52.0)
Hemoglobin: 11.2 g/dL — ABNORMAL LOW (ref 13.0–17.0)
Hemoglobin: 11.6 g/dL — ABNORMAL LOW (ref 13.0–17.0)
O2 Saturation: 97 %
O2 Saturation: 98 %
Patient temperature: 35.9
Patient temperature: 36.1
Potassium: 4.3 mmol/L (ref 3.5–5.1)
Potassium: 4.5 mmol/L (ref 3.5–5.1)
Sodium: 139 mmol/L (ref 135–145)
Sodium: 140 mmol/L (ref 135–145)
TCO2: 27 mmol/L (ref 22–32)
TCO2: 26 mmol/L (ref 22–32)
pCO2 arterial: 50.4 mmHg — ABNORMAL HIGH (ref 32.0–48.0)
pCO2 arterial: 46.4 mmHg (ref 32.0–48.0)
pH, Arterial: 7.312 — ABNORMAL LOW (ref 7.350–7.450)
pH, Arterial: 7.329 — ABNORMAL LOW (ref 7.350–7.450)
pO2, Arterial: 96 mmHg (ref 83.0–108.0)
pO2, Arterial: 108 mmHg (ref 83.0–108.0)

## 2020-03-06 LAB — GLUCOSE, CAPILLARY
Glucose-Capillary: 111 mg/dL — ABNORMAL HIGH (ref 70–99)
Glucose-Capillary: 134 mg/dL — ABNORMAL HIGH (ref 70–99)
Glucose-Capillary: 140 mg/dL — ABNORMAL HIGH (ref 70–99)
Glucose-Capillary: 118 mg/dL — ABNORMAL HIGH (ref 70–99)
Glucose-Capillary: 133 mg/dL — ABNORMAL HIGH (ref 70–99)
Glucose-Capillary: 122 mg/dL — ABNORMAL HIGH (ref 70–99)
Glucose-Capillary: 146 mg/dL — ABNORMAL HIGH (ref 70–99)

## 2020-03-06 LAB — CBC
HCT: 31.7 % — ABNORMAL LOW (ref 39.0–52.0)
HCT: 32.2 % — ABNORMAL LOW (ref 39.0–52.0)
Hemoglobin: 10.2 g/dL — ABNORMAL LOW (ref 13.0–17.0)
Hemoglobin: 10.3 g/dL — ABNORMAL LOW (ref 13.0–17.0)
MCH: 28.8 pg (ref 26.0–34.0)
MCH: 28.6 pg (ref 26.0–34.0)
MCHC: 32.2 g/dL (ref 30.0–36.0)
MCHC: 32 g/dL (ref 30.0–36.0)
MCV: 89.5 fL (ref 80.0–100.0)
MCV: 89.4 fL (ref 80.0–100.0)
Platelets: 146 10*3/uL — ABNORMAL LOW (ref 150–400)
Platelets: 157 10*3/uL (ref 150–400)
RBC: 3.54 MIL/uL — ABNORMAL LOW (ref 4.22–5.81)
RBC: 3.6 MIL/uL — ABNORMAL LOW (ref 4.22–5.81)
RDW: 12.1 % (ref 11.5–15.5)
RDW: 12.2 % (ref 11.5–15.5)
WBC: 25.8 10*3/uL — ABNORMAL HIGH (ref 4.0–10.5)
WBC: 19.3 10*3/uL — ABNORMAL HIGH (ref 4.0–10.5)
nRBC: 0 % (ref 0.0–0.2)
nRBC: 0 % (ref 0.0–0.2)

## 2020-03-06 LAB — BASIC METABOLIC PANEL
Anion gap: 9 (ref 5–15)
BUN: 10 mg/dL (ref 6–20)
CO2: 24 mmol/L (ref 22–32)
Calcium: 9 mg/dL (ref 8.9–10.3)
Chloride: 104 mmol/L (ref 98–111)
Creatinine, Ser: 0.84 mg/dL (ref 0.61–1.24)
GFR calc Af Amer: >60 mL/min (ref >60)
GFR calc non Af Amer: >60 mL/min (ref >60)
Glucose, Bld: 133 mg/dL — ABNORMAL HIGH (ref 70–99)
Potassium: 4.3 mmol/L (ref 3.5–5.1)
Sodium: 137 mmol/L (ref 135–145)

## 2020-03-06 LAB — APTT: aPTT: 38 seconds — ABNORMAL HIGH (ref 24–36)

## 2020-03-06 LAB — ECHO INTRAOPERATIVE TEE
Height: 69 in
Weight: 2880 oz

## 2020-03-06 LAB — MAGNESIUM: Magnesium: 2.8 mg/dL — ABNORMAL HIGH (ref 1.7–2.4)

## 2020-03-06 LAB — PROTIME-INR
INR: 1.4 — ABNORMAL HIGH (ref 0.8–1.2)
Prothrombin Time: 16.8 seconds — ABNORMAL HIGH (ref 11.4–15.2)

## 2020-03-06 LAB — HEMOGLOBIN AND HEMATOCRIT, BLOOD
HCT: 26.3 % — ABNORMAL LOW (ref 39.0–52.0)
Hemoglobin: 8.5 g/dL — ABNORMAL LOW (ref 13.0–17.0)

## 2020-03-06 LAB — PLATELET COUNT: Platelets: 154 10*3/uL (ref 150–400)

## 2020-03-06 SURGERY — REPLACEMENT, MITRAL VALVE, MINIMALLY INVASIVE
Anesthesia: General | Site: Chest | Laterality: Right

## 2020-03-06 MED ORDER — ACETAMINOPHEN 160 MG/5ML PO SOLN: 650.0000 mg | Freq: Once | ORAL | Status: AC

## 2020-03-06 MED ORDER — CHLORHEXIDINE GLUCONATE 0.12 % MT SOLN
15.0000 mL | Freq: Once | OROMUCOSAL | Status: AC
  Administered 2020-03-06 (×2): 15 mL via OROMUCOSAL
  Filled 2020-03-06: qty 15

## 2020-03-06 MED ORDER — BISACODYL 5 MG PO TBEC
10.0000 mg | DELAYED_RELEASE_TABLET | Freq: Every day | ORAL | Status: DC
  Administered 2020-03-07 – 2020-03-17 (×7): 10 mg via ORAL
  Filled 2020-03-06 (×9): qty 2

## 2020-03-06 MED ORDER — MIDAZOLAM HCL (PF) 10 MG/2ML IJ SOLN
INTRAMUSCULAR | Status: AC
  Filled 2020-03-06: qty 2

## 2020-03-06 MED ORDER — POTASSIUM CHLORIDE 10 MEQ/50ML IV SOLN: 10.0000 meq | INTRAVENOUS | Status: AC

## 2020-03-06 MED ORDER — ONDANSETRON HCL 4 MG/2ML IJ SOLN
INTRAMUSCULAR | Status: AC
  Filled 2020-03-06: qty 2

## 2020-03-06 MED ORDER — VANCOMYCIN HCL IN DEXTROSE 1-5 GM/200ML-% IV SOLN
1000.0000 mg | Freq: Once | INTRAVENOUS | Status: AC
  Administered 2020-03-06: 1000 mg via INTRAVENOUS
  Filled 2020-03-06: qty 200

## 2020-03-06 MED ORDER — INSULIN REGULAR(HUMAN) IN NACL 100-0.9 UT/100ML-% IV SOLN
INTRAVENOUS | Status: DC | PRN
  Administered 2020-03-06: 1 [IU]/h via INTRAVENOUS

## 2020-03-06 MED ORDER — SODIUM CHLORIDE 0.9% FLUSH: 10.0000 mL | INTRAVENOUS | Status: DC | PRN

## 2020-03-06 MED ORDER — ROCURONIUM BROMIDE 10 MG/ML (PF) SYRINGE
PREFILLED_SYRINGE | INTRAVENOUS | Status: AC
  Filled 2020-03-06: qty 10

## 2020-03-06 MED ORDER — SODIUM CHLORIDE 0.9% FLUSH: 3.0000 mL | INTRAVENOUS | Status: DC | PRN

## 2020-03-06 MED ORDER — METOPROLOL TARTRATE 5 MG/5ML IV SOLN
2.5000 mg | INTRAVENOUS | Status: DC | PRN
  Administered 2020-03-06 – 2020-03-08 (×4): 5 mg via INTRAVENOUS
  Filled 2020-03-06 (×4): qty 5

## 2020-03-06 MED ORDER — NITROGLYCERIN IN D5W 200-5 MCG/ML-% IV SOLN: 0.0000 ug/min | INTRAVENOUS | Status: DC

## 2020-03-06 MED ORDER — ASPIRIN 81 MG PO CHEW: 324.0000 mg | CHEWABLE_TABLET | Freq: Every day | ORAL | Status: DC

## 2020-03-06 MED ORDER — FAMOTIDINE IN NACL 20-0.9 MG/50ML-% IV SOLN
20.0000 mg | Freq: Two times a day (BID) | INTRAVENOUS | Status: AC
  Administered 2020-03-06 (×2): 20 mg via INTRAVENOUS
  Filled 2020-03-06 (×2): qty 50

## 2020-03-06 MED ORDER — FENTANYL CITRATE (PF) 250 MCG/5ML IJ SOLN
INTRAMUSCULAR | Status: AC
  Filled 2020-03-06: qty 10

## 2020-03-06 MED ORDER — PROTAMINE SULFATE 10 MG/ML IV SOLN
INTRAVENOUS | Status: DC | PRN
  Administered 2020-03-06: 290 mg via INTRAVENOUS

## 2020-03-06 MED ORDER — VANCOMYCIN HCL 1000 MG IV SOLR
INTRAVENOUS | Status: DC | PRN
  Administered 2020-03-06: 1000 mL

## 2020-03-06 MED ORDER — SODIUM CHLORIDE 0.9 % IR SOLN
Status: DC | PRN
  Administered 2020-03-06: 3000 mL

## 2020-03-06 MED ORDER — MIDAZOLAM HCL 5 MG/5ML IJ SOLN
INTRAMUSCULAR | Status: DC | PRN
  Administered 2020-03-06 (×2): 2 mg via INTRAVENOUS

## 2020-03-06 MED ORDER — MIDAZOLAM HCL 2 MG/2ML IJ SOLN: 2.0000 mg | INTRAMUSCULAR | Status: DC | PRN

## 2020-03-06 MED ORDER — DEXTROSE 50 % IV SOLN: 0.0000 mL | INTRAVENOUS | Status: DC | PRN

## 2020-03-06 MED ORDER — CHLORHEXIDINE GLUCONATE CLOTH 2 % EX PADS
6.0000 | MEDICATED_PAD | Freq: Every day | CUTANEOUS | Status: DC
  Administered 2020-03-06 – 2020-03-10 (×5): 6 via TOPICAL

## 2020-03-06 MED ORDER — ONDANSETRON HCL 4 MG/2ML IJ SOLN
4.0000 mg | Freq: Four times a day (QID) | INTRAMUSCULAR | Status: DC | PRN
  Administered 2020-03-07: 4 mg via INTRAVENOUS
  Filled 2020-03-06: qty 2

## 2020-03-06 MED ORDER — SODIUM CHLORIDE 0.9% FLUSH
10.0000 mL | Freq: Two times a day (BID) | INTRAVENOUS | Status: DC
  Administered 2020-03-06 – 2020-03-07 (×3): 10 mL

## 2020-03-06 MED ORDER — MAGNESIUM SULFATE 4 GM/100ML IV SOLN
4.0000 g | Freq: Once | INTRAVENOUS | Status: AC
  Administered 2020-03-06: 4 g via INTRAVENOUS
  Filled 2020-03-06: qty 100

## 2020-03-06 MED ORDER — ACETAMINOPHEN 650 MG RE SUPP
650.0000 mg | Freq: Once | RECTAL | Status: AC
  Administered 2020-03-06: 650 mg via RECTAL

## 2020-03-06 MED ORDER — HEPARIN SODIUM (PORCINE) 1000 UNIT/ML IJ SOLN
INTRAMUSCULAR | Status: AC
  Filled 2020-03-06: qty 1

## 2020-03-06 MED ORDER — LACTATED RINGERS IV SOLN: INTRAVENOUS | Status: DC | PRN

## 2020-03-06 MED ORDER — 0.9 % SODIUM CHLORIDE (POUR BTL) OPTIME
TOPICAL | Status: DC | PRN
  Administered 2020-03-06: 5000 mL

## 2020-03-06 MED ORDER — ALBUMIN HUMAN 5 % IV SOLN
INTRAVENOUS | Status: DC | PRN
Start: 2020-03-06 — End: 2020-03-06

## 2020-03-06 MED ORDER — SODIUM CHLORIDE 0.9% FLUSH
3.0000 mL | Freq: Two times a day (BID) | INTRAVENOUS | Status: DC
  Administered 2020-03-07 – 2020-03-18 (×18): 3 mL via INTRAVENOUS

## 2020-03-06 MED ORDER — DEXAMETHASONE SODIUM PHOSPHATE 10 MG/ML IJ SOLN
INTRAMUSCULAR | Status: DC | PRN
Start: 2020-03-06 — End: 2020-03-06
  Administered 2020-03-06: 10 mg via INTRAVENOUS

## 2020-03-06 MED ORDER — CHLORHEXIDINE GLUCONATE 0.12 % MT SOLN
15.0000 mL | OROMUCOSAL | Status: AC
  Administered 2020-03-06: 15 mL via OROMUCOSAL

## 2020-03-06 MED ORDER — INSULIN ASPART 100 UNIT/ML ~~LOC~~ SOLN
0.0000 [IU] | SUBCUTANEOUS | Status: DC
  Administered 2020-03-07: 2 [IU] via SUBCUTANEOUS

## 2020-03-06 MED ORDER — PHENYLEPHRINE HCL-NACL 20-0.9 MG/250ML-% IV SOLN
INTRAVENOUS | Status: DC | PRN
  Administered 2020-03-06: 15 ug/min via INTRAVENOUS

## 2020-03-06 MED ORDER — PROTAMINE SULFATE 10 MG/ML IV SOLN
INTRAVENOUS | Status: AC
  Filled 2020-03-06: qty 25

## 2020-03-06 MED ORDER — PROPOFOL 10 MG/ML IV BOLUS
INTRAVENOUS | Status: DC | PRN
  Administered 2020-03-06: 180 mg via INTRAVENOUS

## 2020-03-06 MED ORDER — WARFARIN SODIUM 2.5 MG PO TABS: 2.5000 mg | ORAL_TABLET | Freq: Every day | ORAL | Status: DC

## 2020-03-06 MED ORDER — BUPIVACAINE LIPOSOME 1.3 % IJ SUSP
20.0000 mL | Freq: Once | INTRAMUSCULAR | Status: DC
  Filled 2020-03-06: qty 20

## 2020-03-06 MED ORDER — OXYCODONE HCL 5 MG PO TABS
5.0000 mg | ORAL_TABLET | ORAL | Status: DC | PRN
  Administered 2020-03-06 – 2020-03-17 (×11): 10 mg via ORAL
  Filled 2020-03-06 (×12): qty 2

## 2020-03-06 MED ORDER — DEXMEDETOMIDINE HCL IN NACL 400 MCG/100ML IV SOLN
INTRAVENOUS | Status: DC | PRN
  Administered 2020-03-06: .4 ug/kg/h via INTRAVENOUS

## 2020-03-06 MED ORDER — ASPIRIN EC 325 MG PO TBEC: 325.0000 mg | DELAYED_RELEASE_TABLET | Freq: Every day | ORAL | Status: DC

## 2020-03-06 MED ORDER — METOPROLOL TARTRATE 12.5 MG HALF TABLET
12.5000 mg | ORAL_TABLET | Freq: Once | ORAL | Status: AC
  Administered 2020-03-06: 12.5 mg via ORAL
  Filled 2020-03-06: qty 1

## 2020-03-06 MED ORDER — DOCUSATE SODIUM 100 MG PO CAPS
200.0000 mg | ORAL_CAPSULE | Freq: Every day | ORAL | Status: DC
  Administered 2020-03-07 – 2020-03-18 (×8): 200 mg via ORAL
  Filled 2020-03-06 (×11): qty 2

## 2020-03-06 MED ORDER — DEXMEDETOMIDINE HCL IN NACL 400 MCG/100ML IV SOLN: 0.0000 ug/kg/h | INTRAVENOUS | Status: DC

## 2020-03-06 MED ORDER — BUPIVACAINE LIPOSOME 1.3 % IJ SUSP
INTRAMUSCULAR | Status: DC | PRN
  Administered 2020-03-06: 50 mL

## 2020-03-06 MED ORDER — PROPOFOL 10 MG/ML IV BOLUS
INTRAVENOUS | Status: AC
  Filled 2020-03-06: qty 20

## 2020-03-06 MED ORDER — ONDANSETRON HCL 4 MG/2ML IJ SOLN
INTRAMUSCULAR | Status: DC | PRN
Start: 2020-03-06 — End: 2020-03-06
  Administered 2020-03-06: 4 mg via INTRAVENOUS

## 2020-03-06 MED ORDER — MORPHINE SULFATE (PF) 2 MG/ML IV SOLN
1.0000 mg | INTRAVENOUS | Status: DC | PRN
  Administered 2020-03-06: 4 mg via INTRAVENOUS
  Administered 2020-03-06: 2 mg via INTRAVENOUS
  Administered 2020-03-06: 4 mg via INTRAVENOUS
  Filled 2020-03-06 (×2): qty 2
  Filled 2020-03-06: qty 1
  Filled 2020-03-06: qty 2

## 2020-03-06 MED ORDER — ALBUMIN HUMAN 5 % IV SOLN: 250.0000 mL | INTRAVENOUS | Status: DC | PRN

## 2020-03-06 MED ORDER — ROCURONIUM BROMIDE 10 MG/ML (PF) SYRINGE
PREFILLED_SYRINGE | INTRAVENOUS | Status: DC | PRN
  Administered 2020-03-06: 50 mg via INTRAVENOUS
  Administered 2020-03-06: 60 mg via INTRAVENOUS
  Administered 2020-03-06: 40 mg via INTRAVENOUS

## 2020-03-06 MED ORDER — LACTATED RINGERS IV SOLN: 500.0000 mL | Freq: Once | INTRAVENOUS | Status: DC | PRN

## 2020-03-06 MED ORDER — CHLORHEXIDINE GLUCONATE 4 % EX LIQD: 30.0000 mL | CUTANEOUS | Status: DC

## 2020-03-06 MED ORDER — ORAL CARE MOUTH RINSE
15.0000 mL | Freq: Two times a day (BID) | OROMUCOSAL | Status: DC
  Administered 2020-03-06 – 2020-03-18 (×16): 15 mL via OROMUCOSAL

## 2020-03-06 MED ORDER — ARTIFICIAL TEARS OPHTHALMIC OINT
TOPICAL_OINTMENT | OPHTHALMIC | Status: DC | PRN
  Administered 2020-03-06: 1 via OPHTHALMIC

## 2020-03-06 MED ORDER — PLASMA-LYTE 148 IV SOLN
INTRAVENOUS | Status: DC | PRN
  Administered 2020-03-06: 500 mL

## 2020-03-06 MED ORDER — BUPIVACAINE HCL 0.5 % IJ SOLN
INTRAMUSCULAR | Status: DC | PRN
  Administered 2020-03-06: 30 mL

## 2020-03-06 MED ORDER — SODIUM CHLORIDE 0.9 % IV SOLN
1.5000 g | Freq: Two times a day (BID) | INTRAVENOUS | Status: AC
  Administered 2020-03-06 – 2020-03-08 (×4): 1.5 g via INTRAVENOUS
  Filled 2020-03-06 (×6): qty 1.5

## 2020-03-06 MED ORDER — DEXAMETHASONE SODIUM PHOSPHATE 10 MG/ML IJ SOLN
INTRAMUSCULAR | Status: AC
  Filled 2020-03-06: qty 1

## 2020-03-06 MED ORDER — TRANEXAMIC ACID 1000 MG/10ML IV SOLN
INTRAVENOUS | Status: DC | PRN
  Administered 2020-03-06: 1.5 mg/kg/h via INTRAVENOUS

## 2020-03-06 MED ORDER — PHENYLEPHRINE 40 MCG/ML (10ML) SYRINGE FOR IV PUSH (FOR BLOOD PRESSURE SUPPORT)
PREFILLED_SYRINGE | INTRAVENOUS | Status: DC | PRN
  Administered 2020-03-06: 80 ug via INTRAVENOUS

## 2020-03-06 MED ORDER — PANTOPRAZOLE SODIUM 40 MG PO TBEC
40.0000 mg | DELAYED_RELEASE_TABLET | Freq: Every day | ORAL | Status: DC
  Administered 2020-03-08 – 2020-03-18 (×11): 40 mg via ORAL
  Filled 2020-03-06 (×11): qty 1

## 2020-03-06 MED ORDER — LACTATED RINGERS IV SOLN: INTRAVENOUS | Status: DC

## 2020-03-06 MED ORDER — SODIUM CHLORIDE 0.9 % IV SOLN: INTRAVENOUS | Status: AC

## 2020-03-06 MED ORDER — CALCIUM CHLORIDE 10 % IV SOLN
INTRAVENOUS | Status: DC | PRN
Start: 2020-03-06 — End: 2020-03-06
  Administered 2020-03-06: 1 g via INTRAVENOUS

## 2020-03-06 MED ORDER — SODIUM CHLORIDE 0.9 % IV SOLN: INTRAVENOUS | Status: DC

## 2020-03-06 MED ORDER — BUPIVACAINE HCL (PF) 0.5 % IJ SOLN
INTRAMUSCULAR | Status: AC
  Filled 2020-03-06: qty 30

## 2020-03-06 MED ORDER — WARFARIN - PHYSICIAN DOSING INPATIENT: Freq: Every day | Status: DC

## 2020-03-06 MED ORDER — BISACODYL 10 MG RE SUPP: 10.0000 mg | Freq: Every day | RECTAL | Status: DC

## 2020-03-06 MED ORDER — PHENYLEPHRINE HCL-NACL 20-0.9 MG/250ML-% IV SOLN: 0.0000 ug/min | INTRAVENOUS | Status: DC

## 2020-03-06 MED ORDER — SUGAMMADEX SODIUM 200 MG/2ML IV SOLN
INTRAVENOUS | Status: DC | PRN
  Administered 2020-03-06: 200 mg via INTRAVENOUS

## 2020-03-06 MED ORDER — SODIUM CHLORIDE 0.45 % IV SOLN: INTRAVENOUS | Status: DC | PRN

## 2020-03-06 MED ORDER — ACETAMINOPHEN 500 MG PO TABS
1000.0000 mg | ORAL_TABLET | Freq: Four times a day (QID) | ORAL | Status: AC
  Administered 2020-03-06 – 2020-03-11 (×19): 1000 mg via ORAL
  Filled 2020-03-06 (×20): qty 2

## 2020-03-06 MED ORDER — INSULIN REGULAR(HUMAN) IN NACL 100-0.9 UT/100ML-% IV SOLN: INTRAVENOUS | Status: DC

## 2020-03-06 MED ORDER — LIDOCAINE 2% (20 MG/ML) 5 ML SYRINGE
INTRAMUSCULAR | Status: DC | PRN
  Administered 2020-03-06: 60 mg via INTRAVENOUS

## 2020-03-06 MED ORDER — TRAMADOL HCL 50 MG PO TABS
50.0000 mg | ORAL_TABLET | ORAL | Status: DC | PRN
  Administered 2020-03-09 – 2020-03-13 (×6): 100 mg via ORAL
  Filled 2020-03-06 (×7): qty 2

## 2020-03-06 MED ORDER — LIDOCAINE 2% (20 MG/ML) 5 ML SYRINGE
INTRAMUSCULAR | Status: AC
  Filled 2020-03-06: qty 5

## 2020-03-06 MED ORDER — SODIUM CHLORIDE 0.9 % IV SOLN: 250.0000 mL | INTRAVENOUS | Status: DC

## 2020-03-06 MED ORDER — HEPARIN SODIUM (PORCINE) 1000 UNIT/ML IJ SOLN
INTRAMUSCULAR | Status: DC | PRN
  Administered 2020-03-06: 29000 [IU] via INTRAVENOUS

## 2020-03-06 MED ORDER — ACETAMINOPHEN 160 MG/5ML PO SOLN: 1000.0000 mg | Freq: Four times a day (QID) | ORAL | Status: DC

## 2020-03-06 MED ORDER — FENTANYL CITRATE (PF) 100 MCG/2ML IJ SOLN
INTRAMUSCULAR | Status: DC | PRN
  Administered 2020-03-06 (×3): 50 ug via INTRAVENOUS
  Administered 2020-03-06: 150 ug via INTRAVENOUS
  Administered 2020-03-06: 100 ug via INTRAVENOUS

## 2020-03-06 SURGICAL SUPPLY — 107 items
ADAPTER CARDIO PERF ANTE/RETRO (ADAPTER) ×3 IMPLANT
BAG DECANTER FOR FLEXI CONT (MISCELLANEOUS) ×6 IMPLANT
BLADE CLIPPER SURG (BLADE) ×3 IMPLANT
BLADE STERNUM SYSTEM 6 (BLADE) ×3 IMPLANT
BLADE SURG 11 STRL SS (BLADE) ×9 IMPLANT
CANISTER SUCT 3000ML PPV (MISCELLANEOUS) ×6 IMPLANT
CANNULA ADULT BIO-MEDICUS 15FR (CANNULA) ×3 IMPLANT
CANNULA FEM VENOUS REMOTE 22FR (CANNULA) ×3 IMPLANT
CANNULA FEMORAL ART 14 SM (MISCELLANEOUS) ×3 IMPLANT
CANNULA GUNDRY RCSP 15FR (MISCELLANEOUS) ×3 IMPLANT
CANNULA OPTISITE PERFUSION 16F (CANNULA) IMPLANT
CANNULA OPTISITE PERFUSION 18F (CANNULA) ×3 IMPLANT
CANNULA SUMP PERICARDIAL (CANNULA) ×6 IMPLANT
CATH CPB KIT OWEN (MISCELLANEOUS) IMPLANT
CELLS DAT CNTRL 66122 CELL SVR (MISCELLANEOUS) ×2 IMPLANT
CNTNR URN SCR LID CUP LEK RST (MISCELLANEOUS) ×2 IMPLANT
CONN ST 1/4X3/8  BEN (MISCELLANEOUS) ×12
CONN ST 1/4X3/8 BEN (MISCELLANEOUS) ×8 IMPLANT
CONNECTOR 1/2X3/8X1/2 3 WAY (MISCELLANEOUS) ×3
CONNECTOR 1/2X3/8X1/2 3WAY (MISCELLANEOUS) ×2 IMPLANT
CONT SPEC 4OZ STRL OR WHT (MISCELLANEOUS) ×3
COVER BACK TABLE 24X17X13 BIG (DRAPES) ×3 IMPLANT
COVER PROBE W GEL 5X96 (DRAPES) ×3 IMPLANT
DERMABOND ADVANCED (GAUZE/BANDAGES/DRESSINGS) ×1
DERMABOND ADVANCED .7 DNX12 (GAUZE/BANDAGES/DRESSINGS) ×2 IMPLANT
DEVICE CLOSURE PERCLS PRGLD 6F (VASCULAR PRODUCTS) ×8 IMPLANT
DEVICE SUT CK QUICK LOAD MINI (Prosthesis & Implant Heart) ×6 IMPLANT
DEVICE TROCAR PUNCTURE CLOSURE (ENDOMECHANICALS) ×3 IMPLANT
DRAIN CHANNEL 32F RND 10.7 FF (WOUND CARE) ×6 IMPLANT
DRAPE C-ARM 42X72 X-RAY (DRAPES) ×3 IMPLANT
DRAPE CV SPLIT W-CLR ANES SCRN (DRAPES) ×3 IMPLANT
DRAPE INCISE IOBAN 66X45 STRL (DRAPES) ×6 IMPLANT
DRAPE PERI GROIN 82X75IN TIB (DRAPES) ×3 IMPLANT
DRAPE SLUSH/WARMER DISC (DRAPES) ×3 IMPLANT
DRSG COVADERM 4X8 (GAUZE/BANDAGES/DRESSINGS) IMPLANT
ELECT BLADE 6.5 EXT (BLADE) ×3 IMPLANT
ELECT REM PT RETURN 9FT ADLT (ELECTROSURGICAL) ×6
ELECTRODE REM PT RTRN 9FT ADLT (ELECTROSURGICAL) ×4 IMPLANT
FELT TEFLON 1X6 (MISCELLANEOUS) ×6 IMPLANT
FEMORAL VENOUS CANN RAP (CANNULA) IMPLANT
GAUZE SPONGE 4X4 12PLY STRL (GAUZE/BANDAGES/DRESSINGS) ×3 IMPLANT
GAUZE SPONGE 4X4 12PLY STRL LF (GAUZE/BANDAGES/DRESSINGS) ×3 IMPLANT
GLOVE BIO SURGEON STRL SZ 6.5 (GLOVE) ×6 IMPLANT
GLOVE BIOGEL PI IND STRL 6 (GLOVE) ×2 IMPLANT
GLOVE BIOGEL PI IND STRL 6.5 (GLOVE) ×4 IMPLANT
GLOVE BIOGEL PI IND STRL 7.5 (GLOVE) ×4 IMPLANT
GLOVE BIOGEL PI IND STRL 9 (GLOVE) ×2 IMPLANT
GLOVE BIOGEL PI INDICATOR 6 (GLOVE) ×1
GLOVE BIOGEL PI INDICATOR 6.5 (GLOVE) ×2
GLOVE BIOGEL PI INDICATOR 7.5 (GLOVE) ×2
GLOVE BIOGEL PI INDICATOR 9 (GLOVE) ×1
GLOVE ORTHO TXT STRL SZ7.5 (GLOVE) ×9 IMPLANT
GLOVE SURG SS PI 7.5 STRL IVOR (GLOVE) ×3 IMPLANT
GOWN STRL REUS W/ TWL LRG LVL3 (GOWN DISPOSABLE) ×14 IMPLANT
GOWN STRL REUS W/TWL LRG LVL3 (GOWN DISPOSABLE) ×21
KIT BASIN OR (CUSTOM PROCEDURE TRAY) ×3 IMPLANT
KIT DEVICE SUT COR-KNOT MIS 5 (INSTRUMENTS) ×3 IMPLANT
KIT DILATOR VASC 18G NDL (KITS) ×3 IMPLANT
KIT DRAINAGE VACCUM ASSIST (KITS) ×3 IMPLANT
KIT SUCTION CATH 14FR (SUCTIONS) ×3 IMPLANT
KIT SUT CK MINI COMBO 4X17 (Prosthesis & Implant Heart) ×3 IMPLANT
KIT TURNOVER KIT B (KITS) ×3 IMPLANT
LEAD PACING MYOCARDI (MISCELLANEOUS) ×3 IMPLANT
LINE VENT (MISCELLANEOUS) ×3 IMPLANT
NEEDLE AORTIC ROOT 14G 7F (CATHETERS) ×3 IMPLANT
NS IRRIG 1000ML POUR BTL (IV SOLUTION) ×15 IMPLANT
PACK E MIN INVASIVE VALVE (SUTURE) ×3 IMPLANT
PACK OPEN HEART (CUSTOM PROCEDURE TRAY) ×3 IMPLANT
PAD ARMBOARD 7.5X6 YLW CONV (MISCELLANEOUS) ×6 IMPLANT
PAD ELECT DEFIB RADIOL ZOLL (MISCELLANEOUS) ×3 IMPLANT
PERCLOSE PROGLIDE 6F (VASCULAR PRODUCTS) ×12
POSITIONER HEAD DONUT 9IN (MISCELLANEOUS) ×3 IMPLANT
RTRCTR WOUND ALEXIS 18CM MED (MISCELLANEOUS) ×3
SET CANNULATION TOURNIQUET (MISCELLANEOUS) ×3 IMPLANT
SET CARDIOPLEGIA MPS 5001102 (MISCELLANEOUS) ×3 IMPLANT
SET IRRIG TUBING LAPAROSCOPIC (IRRIGATION / IRRIGATOR) ×3 IMPLANT
SET MICROPUNCTURE 5F STIFF (MISCELLANEOUS) ×3 IMPLANT
SHEATH PINNACLE 8F 10CM (SHEATH) ×9 IMPLANT
SOL ANTI FOG 6CC (MISCELLANEOUS) ×2 IMPLANT
SOLUTION ANTI FOG 6CC (MISCELLANEOUS) ×1
SUT BONE WAX W31G (SUTURE) ×3 IMPLANT
SUT ETHIBON 2 0 V 52N 30 (SUTURE) ×3 IMPLANT
SUT ETHIBOND 2 0 SH (SUTURE) ×6 IMPLANT
SUT ETHIBOND X763 2 0 SH 1 (SUTURE) ×6 IMPLANT
SUT GORETEX CV 4 TH 22 36 (SUTURE) IMPLANT
SUT GORETEX CV4 TH-18 (SUTURE) IMPLANT
SUT MNCRL AB 3-0 PS2 27 (SUTURE) ×6 IMPLANT
SUT PROLENE 3 0 SH1 36 (SUTURE) ×15 IMPLANT
SUT SILK  1 MH (SUTURE) ×3
SUT SILK 1 MH (SUTURE) ×2 IMPLANT
SUT VIC AB 2-0 CTX 36 (SUTURE) ×6 IMPLANT
SUT VIC AB 3-0 SH 8-18 (SUTURE) ×6 IMPLANT
SYR 10ML LL (SYRINGE) ×3 IMPLANT
SYSTEM SAHARA CHEST DRAIN ATS (WOUND CARE) ×3 IMPLANT
TAPE CLOTH SURG 4X10 WHT LF (GAUZE/BANDAGES/DRESSINGS) ×3 IMPLANT
TAPE PAPER 2X10 WHT MICROPORE (GAUZE/BANDAGES/DRESSINGS) ×3 IMPLANT
TOWEL GREEN STERILE (TOWEL DISPOSABLE) ×6 IMPLANT
TOWEL GREEN STERILE FF (TOWEL DISPOSABLE) IMPLANT
TRAY FOLEY SLVR 16FR TEMP STAT (SET/KITS/TRAYS/PACK) ×3 IMPLANT
TROCAR XCEL BLADELESS 5X75MML (TROCAR) ×3 IMPLANT
TROCAR XCEL NON-BLD 11X100MML (ENDOMECHANICALS) ×6 IMPLANT
TUBE SUCT INTRACARD DLP 20F (MISCELLANEOUS) ×3 IMPLANT
TUNNELER SHEATH ON-Q 11GX8 DSP (PAIN MANAGEMENT) IMPLANT
UNDERPAD 30X36 HEAVY ABSORB (UNDERPADS AND DIAPERS) ×3 IMPLANT
VALVE MITRAL 33MM (Prosthesis & Implant Heart) ×3 IMPLANT
WATER STERILE IRR 1000ML POUR (IV SOLUTION) ×6 IMPLANT
WIRE EMERALD 3MM-J .035X150CM (WIRE) ×3 IMPLANT

## 2020-03-06 NOTE — Progress Notes (Signed)
  Echocardiogram Echocardiogram Transesophageal has been performed.  Melvin Burns 03/06/2020, 9:09 AM

## 2020-03-06 NOTE — Anesthesia Preprocedure Evaluation (Signed)
Anesthesia Evaluation  Patient identified by MRN, date of birth, ID band Patient awake    Reviewed: Allergy & Precautions, NPO status , Patient's Chart, lab work & pertinent test results  Airway Mallampati: II  TM Distance: >3 FB Neck ROM: Full    Dental  (+) Edentulous Upper, Edentulous Lower   Pulmonary former smoker,    breath sounds clear to auscultation       Cardiovascular hypertension,  Rhythm:Regular + Diastolic murmurs    Neuro/Psych    GI/Hepatic   Endo/Other    Renal/GU      Musculoskeletal   Abdominal   Peds  Hematology   Anesthesia Other Findings   Reproductive/Obstetrics                             Anesthesia Physical Anesthesia Plan  ASA: III  Anesthesia Plan: General   Post-op Pain Management:    Induction: Intravenous  PONV Risk Score and Plan: Ondansetron and Dexamethasone  Airway Management Planned: Double Lumen EBT  Additional Equipment:   Intra-op Plan:   Post-operative Plan: Possible Post-op intubation/ventilation  Informed Consent: I have reviewed the patients History and Physical, chart, labs and discussed the procedure including the risks, benefits and alternatives for the proposed anesthesia with the patient or authorized representative who has indicated his/her understanding and acceptance.       Plan Discussed with: CRNA and Anesthesiologist  Anesthesia Plan Comments:         Anesthesia Quick Evaluation

## 2020-03-06 NOTE — Anesthesia Procedure Notes (Signed)
Arterial Line Insertion Start/End5/28/2021 7:02 AM, 03/06/2020 7:02 AM Performed by: Modena Morrow, CRNA, CRNA  Patient location: Pre-op. Preanesthetic checklist: patient identified, IV checked, site marked, risks and benefits discussed, surgical consent, monitors and equipment checked, pre-op evaluation, timeout performed and anesthesia consent Lidocaine 1% used for infiltration Left, radial was placed Catheter size: 20 Fr Hand hygiene performed  and maximum sterile barriers used   Attempts: 1 Procedure performed without using ultrasound guided technique. Following insertion, dressing applied and Biopatch. Post procedure assessment: normal and unchanged  Patient tolerated the procedure well with no immediate complications.

## 2020-03-06 NOTE — Anesthesia Procedure Notes (Signed)
Procedure Name: Intubation Date/Time: 03/06/2020 8:59 AM Performed by: Kathryne Hitch, CRNA Pre-anesthesia Checklist: Patient identified, Emergency Drugs available, Suction available and Patient being monitored Patient Re-evaluated:Patient Re-evaluated prior to induction Oxygen Delivery Method: Circle system utilized Preoxygenation: Pre-oxygenation with 100% oxygen Induction Type: IV induction Ventilation: Oral airway inserted - appropriate to patient size and Two handed mask ventilation required Laryngoscope Size: Mac and 4 Grade View: Grade I Tube type: Oral Endobronchial tube: Left and Double lumen EBT and 37 Fr Number of attempts: 1 Airway Equipment and Method: Stylet and Oral airway Placement Confirmation: ETT inserted through vocal cords under direct vision,  positive ETCO2 and breath sounds checked- equal and bilateral Secured at: 29 cm Tube secured with: Tape Dental Injury: Teeth and Oropharynx as per pre-operative assessment

## 2020-03-06 NOTE — Anesthesia Procedure Notes (Signed)
Central Venous Catheter Insertion Performed by: Kipp Brood, MD, anesthesiologist Start/End5/28/2021 6:45 AM, 03/06/2020 6:55 AM Patient location: Pre-op. Preanesthetic checklist: patient identified, IV checked, site marked, risks and benefits discussed, surgical consent, monitors and equipment checked, pre-op evaluation, timeout performed and anesthesia consent Hand hygiene performed  and maximum sterile barriers used  PA cath was placed.Swan type:thermodilution Procedure performed without using ultrasound guided technique. Attempts: 1 Following insertion, line sutured, dressing applied and Biopatch. Post procedure assessment: blood return through all ports and free fluid flow  Patient tolerated the procedure well with no immediate complications.

## 2020-03-06 NOTE — Anesthesia Procedure Notes (Addendum)
Central Venous Catheter Insertion Performed by: Kipp Brood, MD, anesthesiologist Start/End5/28/2021 6:45 AM, 03/06/2020 6:55 AM Patient location: Pre-op. Preanesthetic checklist: patient identified, IV checked, site marked, risks and benefits discussed, surgical consent, monitors and equipment checked, pre-op evaluation, timeout performed and anesthesia consent Lidocaine 1% used for infiltration and patient sedated Hand hygiene performed  and maximum sterile barriers used  Catheter size: 8.5 Fr Sheath introducer Procedure performed using ultrasound guided technique. Ultrasound Notes:anatomy identified, needle tip was noted to be adjacent to the nerve/plexus identified, no ultrasound evidence of intravascular and/or intraneural injection and image(s) printed for medical record Attempts: 1 Following insertion, line sutured and dressing applied. Post procedure assessment: blood return through all ports, free fluid flow and no air  Patient tolerated the procedure well with no immediate complications.

## 2020-03-06 NOTE — Brief Op Note (Signed)
03/06/2020  12:03 PM  PATIENT:  Melvin Burns  54 y.o. male  PRE-OPERATIVE DIAGNOSIS:  SEVERE MITRAL STENOSIS and MITRAL INSUFFICIENCY   POST-OPERATIVE DIAGNOSIS:  SEVERE MITRAL STENOSIS and  MITRAL INSUFFICIENCY  PROCEDURE:   MINIMALLY INVASIVE MITRAL VALVE (MV) REPLACEMENT using Carbomedics Optiform 33 MM Mitral Bioprosthetic Valve. (Right) TRANSESOPHAGEAL ECHOCARDIOGRAM (TEE) (N/A)  SURGEON: Purcell Nails, MD - Primary  PHYSICIAN ASSISTANT: Veida Spira  ANESTHESIA:   general  EBL: Per perfusion and anesthesia records  BLOOD ADMINISTERED:none  DRAINS: mediastinal and right pleural drains   LOCAL MEDICATIONS USED:  NONE  SPECIMEN:  Source of Specimen:  Mitral valve  DISPOSITION OF SPECIMEN:  PATHOLOGY  COUNTS:  YES  DICTATION: .Dragon Dictation  PLAN OF CARE: Admit to inpatient   PATIENT DISPOSITION:  ICU - extubated and stable.   Delay start of Pharmacological VTE agent (>24hrs) due to surgical blood loss or risk of bleeding: yes

## 2020-03-06 NOTE — Op Note (Signed)
CARDIOTHORACIC SURGERY OPERATIVE NOTE  Date of Procedure:  03/06/2020  Preoperative Diagnosis:   Rheumatic Heart Disease  Severe Mitral Stenosis and Moderate Mitral Regurgitation  Postoperative Diagnosis: Same  Procedure:    Minimally-Invasive Mitral Valve Replacement  Sorin Carbomedics Optiform bileaflet mechanical valve (size 33mm, catalog # [id number], serial # [id number])    Surgeon: Valentina Gu. Roxy Manns, MD  Assistant: Enid Cutter, PA-C  Anesthesia: Roberts Gaudy, MD  Operative Findings:  Rheumatic mitral valve disease  Severe mitral stenosis and moderate mitral regurgitation  Normal left ventricular systolic function  Dense adhesions surrounding the right middle lobe and the right lower lobe requiring surgical division for exposure  Dense adhesions obliterating the pericardial space consistent with remote history of pericarditis               BRIEF CLINICAL NOTE AND INDICATIONS FOR SURGERY  Patient is a 54 year old male originally from [place] with history of rheumatic mitral valve disease and hypertension who has been referred for surgical consultation to discuss treatment options for management of severe symptomatic mitral stenosis with mitral regurgitation.  Patient states that he does not have any documented history of rheumatic fever although he recalls being very ill with bronchial infection during his teenage years. Several years ago he was noted to have anabnormal EKG and he was referred for cardiology consultation in 2017. He was noted to have a heart murmur on physical exam and echocardiogram at that time revealed likely rheumatic heart disease with severe mitral stenosis,moderate mitral regurgitation, and normal left ventricular systolic function. The patient denied any symptoms at that time and remained quite active physically. In October 2020 he was treated for pneumonia and since then he has noted the development of  exertional shortness of breath. He was referred for stress echocardiogram although transvalvular gradient was not measured during stress during the procedure. The patient did develop chest discomfort and short runs of ventricular tachycardia during stress. He was seen in follow-up recently by Dr. Oval Linsey who felt that further diagnostic work-up to consider elective mitral valve replacement was warranted. TEE and diagnostic cardiac catheterization were performed December 16, 2019. TEE revealed normal left ventricular function with ejection fraction estimated 60 to 65%. There was severe rheumatic mitral valve stenosis with commissural fusion, diastolic doming, moderate leaflet calcification, and mild calcification of the subvalvular apparatus. By planimetry of the mitral valve area measured just under 1.0 cm. There was mild mitral regurgitation. The aortic valve appeared normal. Right ventricular size and function was normal. There was no significant tricuspid regurgitation.Diagnostic cardiac catheterization confirmed the presence of severe mitral stenosis with mean transvalvular gradient measured 10 mmHg corresponding to aortic valve area calculated 1.3 cm. There was moderately elevated pulmonary capillary wedge pressure and mild to moderately elevated right heart pressures with normal cardiac output. There was two-vessel coronary artery disease with 70% stenosis involving ostial portion of the third obtuse marginal branch with otherwise only mild nonobstructive disease in the remaining coronary circulation. Cardiothoracic surgical consultation was requested.  The patient has been seen in consultation and counseled at length regarding the indications, risks and potential benefits of surgery.  All questions have been answered, and the patient provides full informed consent for the operation as described.    DETAILS OF THE OPERATIVE PROCEDURE  Preparation:  The patient is brought to the  operating room on the above mentioned date and central monitoring was established by the anesthesia team including placement of Swan-Ganz catheter through the left internal jugular vein.  A  radial arterial line is placed. The patient is placed in the supine position on the operating table.  Intravenous antibiotics are administered. General endotracheal anesthesia is induced uneventfully. The patient is initially intubated using a dual lumen endotracheal tube.  A Foley catheter is placed.  Baseline transesophageal echocardiogram was performed.  Findings were notable for classical rheumatic appearing mitral valve with moderate to severe mitral stenosis and moderate mitral regurgitation.  There was normal left ventricular systolic function.  There was normal right ventricular size and systolic function.  There was mild central aortic insufficiency and mild tricuspid regurgitation.  A soft roll is placed behind the patient's left scapula and the neck gently extended and turned to the left.   The patient's right neck, chest, abdomen, both groins, and both lower extremities are prepared and draped in a sterile manner. A time out procedure is performed.   Percutaneous Vascular Access:  Percutaneous arterial and venous access were obtained on the right side.  Using ultrasound guidance the right common femoral vein was cannulated using the Seldinger technique a pair of Perclose vascular closure devises were placed at opposing 30 degree angles in the femoral vein, after which time an 8 French sheath inserted.  The right common femoral artery was cannulated using a micropuncture wire and sheath.  A pair of Perclose vascular closure devices were placed at opposing 30 degree angles in the femoral artery, and a 8 French sheath inserted.  The right internal jugular vein was cannulated  using ultrasound guidance and an 8 French sheath inserted.     Surgical Approach:  A right miniature anterolateral thoracotomy  incision is performed. The incision is placed just lateral to and superior to the right nipple. The pectoralis major muscle is retracted medially and completely preserved. The right pleural space is entered through the 3rd intercostal space. A soft tissue retractor is placed.  Two 11 mm ports are placed through separate stab incisions inferiorly. The right pleural space is insufflated continuously with carbon dioxide gas through the posterior port during the remainder of the operation.  There are dense adhesions between the medial surface of the right middle lobe and right lower lobe and the pericardium.  The adhesions were taken down using a combination of sharp dissection and electrocautery to facilitate adequate exposure of the pericardium.   Extracorporeal Cardiopulmonary Bypass and Myocardial Protection:   The patient was heparinized systemically.  The right common femoral vein is cannulated through the venous sheath and a guidewire advanced into the right atrium using TEE guidance.  The femoral vein cannulated using a 22 Fr long femoral venous cannula.  The right common femoral artery is cannulated through the arterial sheath and a guidewire advanced into the descending thoracic aorta using TEE guidance.  Femoral artery is cannulated with a 18 French femoral arterial cannula.  The right internal jugular vein is cannulated through the venous sheath and a guidewire advanced into the right atrium.  The internal jugular vein is cannulated using a 15 [place] pediatric femoral venous cannula.   Adequate heparinization is verified.   The entire pre-bypass portion of the operation was notable for stable hemodynamics.  Cardiopulmonary bypass was begun.  Vacuum assist venous drainage is utilized.  A longitudinal incision is made in the pericardium 2 cm anterior to the phrenic nerve.  The pericardial space is obliterated with adhesions suggestive of remote history of pericarditis.  Sharp dissection is utilized to  carefully mobilize the right atrium away from the pericardium and the incision in the  pericardium is extended in both directions. Venous drainage and exposure are notably excellent.  Dissection is continued until the ascending aorta is freed up from associated structures, the entire right atrium is exposed, Waterston's groove is developed, and the inferior aspect of the right ventricle has been exposed.  An antegrade cardioplegia cannula is placed in the ascending aorta.    The patient is cooled to 32C systemic temperature.  The aortic cross clamp is applied and cardioplegia is delivered initially in an antegrade fashion through the aortic root using modified [place] cold blood cardioplegia (Kennestone blood cardioplegia protocol).   The initial cardioplegic arrest is rapid with early diastolic arrest.   Myocardial protection was felt to be excellent.   Mitral Valve Replacement:  A left atriotomy incision was performed through the interatrial groove and extended partially across the back wall of the left atrium after opening the oblique sinus inferiorly.  The mitral valve is exposed using a self-retaining retractor.  The mitral valve was inspected and notable for classical rheumatic features with severe fibrosis, thickening, and some calcification of both the anterior and posterior leaflets.  There is fishmouth appearance of the mitral valve with severe chordal fusion, calcification, and shortening of all of the subvalvular apparatus.  The entire mitral valve is excised sharply.  Due to the severity of fibrosis and calcification, it was not possible to preserve any of the subvalvular apparatus.  Once adequate debridement has been completed the valve is sized to accept a 33 mm bileaflet mechanical prosthetic valve.  Mitral valve replacement is performed using interrupted 2-0 Ethibond horizontal mattress pledgeted sutures with pledgets in the supra annular position.  A Sorin Carbomedis Optiform bileaflet  mechanical valve (size 33 mm, Cat #F7-033, serial [id number]) was implanted uneventfully.  All valve sutures were secured using Cor-knot.  The valve was carefully examined to make sure both leaflets are moving without any impingement.  Rewarming is begun.   Procedure Completion:  The atriotomy was closed using a 2-layer closure of running 3-0 Prolene suture after placing a sump drain across the mitral valve to serve as a left ventricular vent.  One final dose of warm "reanimation dose" cardioplegia was administered through the aortic root.  The aortic cross clamp was removed after a total cross clamp time of 79 minutes.  Epicardial pacing wires are fixed to the inferior wall of the right ventricule and to the right atrial appendage. The patient is rewarmed to 37C temperature. The left ventricular vent and antegrade cardioplegia cannula are removed. The patient is weaned and disconnected from cardiopulmonary bypass.  The patient's rhythm at separation from bypass was AV paced.  The patient was weaned from bypass  without any inotropic support. Total cardiopulmonary bypass time for the operation was 130 minutes.  Followup transesophageal echocardiogram performed after separation from bypass revealed a well-seated bileaflet mechanical prosthetic valve that was functioning normally.  There was no paravalvular leak.  Left ventricular function was unchanged from preoperatively.    The femoral arterial and venous cannulas were removed and all Perclose sutures secured.  Manual pressure was maintained while Protamine was administered.  The right internal jugular cannula was removed and manual pressure held on the neck and groin for 15 minutes.  Single lung ventilation was begun. The atriotomy closure was inspected for hemostasis. The pericardial sac was drained using a 28 French Bard drain placed through the anterior port incision.  The right pleural space is irrigated with saline solution and inspected for  hemostasis.   A  mixture of Exparel liposomal bupivacaine (20 mL) and 0.5% bupivacaine (30 mL) is utilized to create an intercostal nerve block for postoperative analgesia.  The mixture is injected under direct vision into the intercostal neurovascular bundles posteriorly to cover the second through the sixth intercostal nerve roots.  Portions of the solution are also injected into the intercostal neurovascular bundles immediately surrounding the surgical incision and immediately adjacent to the chest tube exit sites.  The right pleural space was drained using a 28 French Bard drain placed through the posterior port incision. The miniature thoracotomy incision was closed in multiple layers in routine fashion.   The post-bypass portion of the operation was notable for stable rhythm and hemodynamics.  No blood products were administered during the operation.   Disposition:  The patient tolerated the procedure well.  The patient was extubated in the operating room and subsequently transported to the surgical intensive care unit in stable condition. There were no intraoperative complications. All sponge instrument and needle counts are verified correct at completion of the operation.     Salvatore Decent. [name] MD 03/06/2020 12:52 PM

## 2020-03-06 NOTE — Interval H&P Note (Signed)
History and Physical Interval Note:  03/06/2020 5:37 AM  Melvin Burns  has presented today for surgery, with the diagnosis of MS MR.  The various methods of treatment have been discussed with the patient and family. After consideration of risks, benefits and other options for treatment, the patient has consented to  Procedure(s): MINIMALLY INVASIVE MITRAL VALVE (MV) REPLACEMENT (Right) TRANSESOPHAGEAL ECHOCARDIOGRAM (TEE) (N/A) as a surgical intervention.  The patient's history has been reviewed, patient examined, no change in status, stable for surgery.  I have reviewed the patient's chart and labs.  Questions were answered to the patient's satisfaction.     Purcell Nails

## 2020-03-06 NOTE — Transfer of Care (Signed)
Immediate Anesthesia Transfer of Care Note  Patient: Melvin Burns. Sahr  Procedure(s) Performed: MINIMALLY INVASIVE MITRAL VALVE (MV) REPLACEMENT using Carbomedics Optiform 33 MM Mitral Bioprosthetic Valve. (Right Chest) TRANSESOPHAGEAL ECHOCARDIOGRAM (TEE) (N/A )  Patient Location: ICU  Anesthesia Type:General  Level of Consciousness: drowsy and patient cooperative  Airway & Oxygen Therapy: Patient Spontanous Breathing and Patient connected to face mask oxygen  Post-op Assessment: Report given to RN and Post -op Vital signs reviewed and stable  Post vital signs: Reviewed and stable  Last Vitals:  Vitals Value Taken Time  BP 103/80 03/06/20 1407  Temp 35.9 C 03/06/20 1411  Pulse 80 03/06/20 1411  Resp 15 03/06/20 1411  SpO2 100 % 03/06/20 1411  Vitals shown include unvalidated device data.  Last Pain:  Vitals:   03/06/20 0605  TempSrc:   PainSc: 0-No pain      Patients Stated Pain Goal: 3 (03/06/20 2411)  Complications: No apparent anesthesia complications

## 2020-03-07 ENCOUNTER — Inpatient Hospital Stay (HOSPITAL_COMMUNITY): Payer: No Typology Code available for payment source

## 2020-03-07 LAB — CBC
HCT: 30.9 % — ABNORMAL LOW (ref 39.0–52.0)
HCT: 35.2 % — ABNORMAL LOW (ref 39.0–52.0)
Hemoglobin: 9.9 g/dL — ABNORMAL LOW (ref 13.0–17.0)
Hemoglobin: 11.2 g/dL — ABNORMAL LOW (ref 13.0–17.0)
MCH: 28.4 pg (ref 26.0–34.0)
MCH: 28.2 pg (ref 26.0–34.0)
MCHC: 32 g/dL (ref 30.0–36.0)
MCHC: 31.8 g/dL (ref 30.0–36.0)
MCV: 88.8 fL (ref 80.0–100.0)
MCV: 88.7 fL (ref 80.0–100.0)
Platelets: 144 10*3/uL — ABNORMAL LOW (ref 150–400)
Platelets: 161 10*3/uL (ref 150–400)
RBC: 3.48 MIL/uL — ABNORMAL LOW (ref 4.22–5.81)
RBC: 3.97 MIL/uL — ABNORMAL LOW (ref 4.22–5.81)
RDW: 12.3 % (ref 11.5–15.5)
RDW: 12.6 % (ref 11.5–15.5)
WBC: 17.7 10*3/uL — ABNORMAL HIGH (ref 4.0–10.5)
WBC: 26.5 10*3/uL — ABNORMAL HIGH (ref 4.0–10.5)
nRBC: 0 % (ref 0.0–0.2)
nRBC: 0 % (ref 0.0–0.2)

## 2020-03-07 LAB — POCT I-STAT 7, (LYTES, BLD GAS, ICA,H+H)
Acid-Base Excess: 2 mmol/L (ref 0.0–2.0)
Acid-Base Excess: 3 mmol/L — ABNORMAL HIGH (ref 0.0–2.0)
Acid-Base Excess: 1 mmol/L (ref 0.0–2.0)
Acid-base deficit: 3 mmol/L — ABNORMAL HIGH (ref 0.0–2.0)
Bicarbonate: 29.3 mmol/L — ABNORMAL HIGH (ref 20.0–28.0)
Bicarbonate: 29.3 mmol/L — ABNORMAL HIGH (ref 20.0–28.0)
Bicarbonate: 25.7 mmol/L (ref 20.0–28.0)
Bicarbonate: 24.3 mmol/L (ref 20.0–28.0)
Calcium, Ion: 1.37 mmol/L (ref 1.15–1.40)
Calcium, Ion: 1.15 mmol/L (ref 1.15–1.40)
Calcium, Ion: 1.15 mmol/L (ref 1.15–1.40)
Calcium, Ion: 1.25 mmol/L (ref 1.15–1.40)
HCT: 40 % (ref 39.0–52.0)
HCT: 30 % — ABNORMAL LOW (ref 39.0–52.0)
HCT: 27 % — ABNORMAL LOW (ref 39.0–52.0)
HCT: 29 % — ABNORMAL LOW (ref 39.0–52.0)
Hemoglobin: 13.6 g/dL (ref 13.0–17.0)
Hemoglobin: 10.2 g/dL — ABNORMAL LOW (ref 13.0–17.0)
Hemoglobin: 9.2 g/dL — ABNORMAL LOW (ref 13.0–17.0)
Hemoglobin: 9.9 g/dL — ABNORMAL LOW (ref 13.0–17.0)
O2 Saturation: 100 %
O2 Saturation: 100 %
O2 Saturation: 100 %
O2 Saturation: 94 %
Potassium: 3.6 mmol/L (ref 3.5–5.1)
Potassium: 4.1 mmol/L (ref 3.5–5.1)
Potassium: 4.4 mmol/L (ref 3.5–5.1)
Potassium: 4.1 mmol/L (ref 3.5–5.1)
Sodium: 136 mmol/L (ref 135–145)
Sodium: 138 mmol/L (ref 135–145)
Sodium: 134 mmol/L — ABNORMAL LOW (ref 135–145)
Sodium: 137 mmol/L (ref 135–145)
TCO2: 31 mmol/L (ref 22–32)
TCO2: 31 mmol/L (ref 22–32)
TCO2: 27 mmol/L (ref 22–32)
TCO2: 26 mmol/L (ref 22–32)
pCO2 arterial: 54.3 mmHg — ABNORMAL HIGH (ref 32.0–48.0)
pCO2 arterial: 50.6 mmHg — ABNORMAL HIGH (ref 32.0–48.0)
pCO2 arterial: 42 mmHg (ref 32.0–48.0)
pCO2 arterial: 53.1 mmHg — ABNORMAL HIGH (ref 32.0–48.0)
pH, Arterial: 7.339 — ABNORMAL LOW (ref 7.350–7.450)
pH, Arterial: 7.371 (ref 7.350–7.450)
pH, Arterial: 7.394 (ref 7.350–7.450)
pH, Arterial: 7.268 — ABNORMAL LOW (ref 7.350–7.450)
pO2, Arterial: 471 mmHg — ABNORMAL HIGH (ref 83.0–108.0)
pO2, Arterial: 354 mmHg — ABNORMAL HIGH (ref 83.0–108.0)
pO2, Arterial: 355 mmHg — ABNORMAL HIGH (ref 83.0–108.0)
pO2, Arterial: 84 mmHg (ref 83.0–108.0)

## 2020-03-07 LAB — POCT I-STAT, CHEM 8
BUN: 10 mg/dL (ref 6–20)
BUN: 8 mg/dL (ref 6–20)
BUN: 9 mg/dL (ref 6–20)
BUN: 8 mg/dL (ref 6–20)
BUN: 9 mg/dL (ref 6–20)
BUN: 9 mg/dL (ref 6–20)
Calcium, Ion: 1.41 mmol/L — ABNORMAL HIGH (ref 1.15–1.40)
Calcium, Ion: 1.16 mmol/L (ref 1.15–1.40)
Calcium, Ion: 1.38 mmol/L (ref 1.15–1.40)
Calcium, Ion: 1.17 mmol/L (ref 1.15–1.40)
Calcium, Ion: 1.15 mmol/L (ref 1.15–1.40)
Calcium, Ion: 1.23 mmol/L (ref 1.15–1.40)
Chloride: 100 mmol/L (ref 98–111)
Chloride: 101 mmol/L (ref 98–111)
Chloride: 99 mmol/L (ref 98–111)
Chloride: 99 mmol/L (ref 98–111)
Chloride: 100 mmol/L (ref 98–111)
Chloride: 101 mmol/L (ref 98–111)
Creatinine, Ser: 0.8 mg/dL (ref 0.61–1.24)
Creatinine, Ser: 0.6 mg/dL — ABNORMAL LOW (ref 0.61–1.24)
Creatinine, Ser: 0.8 mg/dL (ref 0.61–1.24)
Creatinine, Ser: 0.7 mg/dL (ref 0.61–1.24)
Creatinine, Ser: 0.9 mg/dL (ref 0.61–1.24)
Creatinine, Ser: 0.8 mg/dL (ref 0.61–1.24)
Glucose, Bld: 114 mg/dL — ABNORMAL HIGH (ref 70–99)
Glucose, Bld: 108 mg/dL — ABNORMAL HIGH (ref 70–99)
Glucose, Bld: 116 mg/dL — ABNORMAL HIGH (ref 70–99)
Glucose, Bld: 142 mg/dL — ABNORMAL HIGH (ref 70–99)
Glucose, Bld: 151 mg/dL — ABNORMAL HIGH (ref 70–99)
Glucose, Bld: 128 mg/dL — ABNORMAL HIGH (ref 70–99)
HCT: 42 % (ref 39.0–52.0)
HCT: 32 % — ABNORMAL LOW (ref 39.0–52.0)
HCT: 39 % (ref 39.0–52.0)
HCT: 29 % — ABNORMAL LOW (ref 39.0–52.0)
HCT: 27 % — ABNORMAL LOW (ref 39.0–52.0)
HCT: 32 % — ABNORMAL LOW (ref 39.0–52.0)
Hemoglobin: 14.3 g/dL (ref 13.0–17.0)
Hemoglobin: 10.9 g/dL — ABNORMAL LOW (ref 13.0–17.0)
Hemoglobin: 13.3 g/dL (ref 13.0–17.0)
Hemoglobin: 9.9 g/dL — ABNORMAL LOW (ref 13.0–17.0)
Hemoglobin: 9.2 g/dL — ABNORMAL LOW (ref 13.0–17.0)
Hemoglobin: 10.9 g/dL — ABNORMAL LOW (ref 13.0–17.0)
Potassium: 3.6 mmol/L (ref 3.5–5.1)
Potassium: 3.9 mmol/L (ref 3.5–5.1)
Potassium: 4.1 mmol/L (ref 3.5–5.1)
Potassium: 4.3 mmol/L (ref 3.5–5.1)
Potassium: 4.6 mmol/L (ref 3.5–5.1)
Potassium: 4.1 mmol/L (ref 3.5–5.1)
Sodium: 137 mmol/L (ref 135–145)
Sodium: 136 mmol/L (ref 135–145)
Sodium: 137 mmol/L (ref 135–145)
Sodium: 132 mmol/L — ABNORMAL LOW (ref 135–145)
Sodium: 134 mmol/L — ABNORMAL LOW (ref 135–145)
Sodium: 137 mmol/L (ref 135–145)
TCO2: 29 mmol/L (ref 22–32)
TCO2: 28 mmol/L (ref 22–32)
TCO2: 28 mmol/L (ref 22–32)
TCO2: 26 mmol/L (ref 22–32)
TCO2: 27 mmol/L (ref 22–32)
TCO2: 23 mmol/L (ref 22–32)

## 2020-03-07 LAB — BASIC METABOLIC PANEL
Anion gap: 8 (ref 5–15)
Anion gap: 13 (ref 5–15)
BUN: 10 mg/dL (ref 6–20)
BUN: 9 mg/dL (ref 6–20)
CO2: 24 mmol/L (ref 22–32)
CO2: 27 mmol/L (ref 22–32)
Calcium: 8.9 mg/dL (ref 8.9–10.3)
Calcium: 9.5 mg/dL (ref 8.9–10.3)
Chloride: 102 mmol/L (ref 98–111)
Chloride: 94 mmol/L — ABNORMAL LOW (ref 98–111)
Creatinine, Ser: 0.83 mg/dL (ref 0.61–1.24)
Creatinine, Ser: 0.86 mg/dL (ref 0.61–1.24)
GFR calc Af Amer: >60 mL/min (ref >60)
GFR calc Af Amer: >60 mL/min (ref >60)
GFR calc non Af Amer: >60 mL/min (ref >60)
GFR calc non Af Amer: >60 mL/min (ref >60)
Glucose, Bld: 131 mg/dL — ABNORMAL HIGH (ref 70–99)
Glucose, Bld: 137 mg/dL — ABNORMAL HIGH (ref 70–99)
Potassium: 3.9 mmol/L (ref 3.5–5.1)
Potassium: 3.6 mmol/L (ref 3.5–5.1)
Sodium: 134 mmol/L — ABNORMAL LOW (ref 135–145)
Sodium: 134 mmol/L — ABNORMAL LOW (ref 135–145)

## 2020-03-07 LAB — PROTIME-INR
INR: 1.2 (ref 0.8–1.2)
Prothrombin Time: 14.6 seconds (ref 11.4–15.2)

## 2020-03-07 LAB — MAGNESIUM
Magnesium: 2.1 mg/dL (ref 1.7–2.4)
Magnesium: 2 mg/dL (ref 1.7–2.4)

## 2020-03-07 LAB — GLUCOSE, CAPILLARY
Glucose-Capillary: 130 mg/dL — ABNORMAL HIGH (ref 70–99)
Glucose-Capillary: 129 mg/dL — ABNORMAL HIGH (ref 70–99)
Glucose-Capillary: 123 mg/dL — ABNORMAL HIGH (ref 70–99)
Glucose-Capillary: 149 mg/dL — ABNORMAL HIGH (ref 70–99)
Glucose-Capillary: 132 mg/dL — ABNORMAL HIGH (ref 70–99)

## 2020-03-07 MED ORDER — SODIUM CHLORIDE 0.9% FLUSH: 3.0000 mL | INTRAVENOUS | Status: DC | PRN

## 2020-03-07 MED ORDER — ASPIRIN EC 325 MG PO TBEC
325.0000 mg | DELAYED_RELEASE_TABLET | Freq: Every day | ORAL | Status: AC
  Administered 2020-03-07: 325 mg via ORAL
  Filled 2020-03-07: qty 1

## 2020-03-07 MED ORDER — AMLODIPINE BESYLATE 10 MG PO TABS
10.0000 mg | ORAL_TABLET | Freq: Every day | ORAL | Status: DC
  Administered 2020-03-07 – 2020-03-18 (×12): 10 mg via ORAL
  Filled 2020-03-07 (×12): qty 1

## 2020-03-07 MED ORDER — SODIUM CHLORIDE 0.9% FLUSH
3.0000 mL | Freq: Two times a day (BID) | INTRAVENOUS | Status: DC
  Administered 2020-03-07 – 2020-03-09 (×3): 3 mL via INTRAVENOUS

## 2020-03-07 MED ORDER — ASPIRIN EC 81 MG PO TBEC
81.0000 mg | DELAYED_RELEASE_TABLET | Freq: Every day | ORAL | Status: DC
  Administered 2020-03-08 – 2020-03-18 (×11): 81 mg via ORAL
  Filled 2020-03-07 (×11): qty 1

## 2020-03-07 MED ORDER — ENOXAPARIN SODIUM 40 MG/0.4ML ~~LOC~~ SOLN: 40.0000 mg | Freq: Every day | SUBCUTANEOUS | Status: DC

## 2020-03-07 MED ORDER — WARFARIN SODIUM 5 MG PO TABS
5.0000 mg | ORAL_TABLET | Freq: Every day | ORAL | Status: DC
  Administered 2020-03-07 – 2020-03-12 (×6): 5 mg via ORAL
  Filled 2020-03-07 (×6): qty 1

## 2020-03-07 MED ORDER — FUROSEMIDE 10 MG/ML IJ SOLN
20.0000 mg | Freq: Three times a day (TID) | INTRAMUSCULAR | Status: DC
  Administered 2020-03-07 – 2020-03-09 (×9): 20 mg via INTRAVENOUS
  Filled 2020-03-07 (×10): qty 2

## 2020-03-07 MED ORDER — ~~LOC~~ CARDIAC SURGERY, PATIENT & FAMILY EDUCATION: Freq: Once | Status: AC

## 2020-03-07 MED ORDER — INSULIN ASPART 100 UNIT/ML ~~LOC~~ SOLN
0.0000 [IU] | SUBCUTANEOUS | Status: DC
  Administered 2020-03-07 (×4): 2 [IU] via SUBCUTANEOUS

## 2020-03-07 MED ORDER — INSULIN ASPART 100 UNIT/ML ~~LOC~~ SOLN: 0.0000 [IU] | SUBCUTANEOUS | Status: DC

## 2020-03-07 MED ORDER — ENOXAPARIN SODIUM 40 MG/0.4ML ~~LOC~~ SOLN
40.0000 mg | Freq: Every day | SUBCUTANEOUS | Status: DC
  Administered 2020-03-08 – 2020-03-14 (×7): 40 mg via SUBCUTANEOUS
  Filled 2020-03-07 (×7): qty 0.4

## 2020-03-07 MED ORDER — INSULIN ASPART 100 UNIT/ML ~~LOC~~ SOLN
0.0000 [IU] | Freq: Three times a day (TID) | SUBCUTANEOUS | Status: DC
  Administered 2020-03-08 – 2020-03-10 (×9): 2 [IU] via SUBCUTANEOUS

## 2020-03-07 MED ORDER — SODIUM CHLORIDE 0.9 % IV SOLN: 250.0000 mL | INTRAVENOUS | Status: DC | PRN

## 2020-03-07 MED ORDER — FUROSEMIDE 10 MG/ML IJ SOLN
20.0000 mg | Freq: Two times a day (BID) | INTRAMUSCULAR | Status: DC
  Administered 2020-03-07 (×2): 20 mg via INTRAVENOUS
  Filled 2020-03-07: qty 2

## 2020-03-07 MED ORDER — POTASSIUM CHLORIDE 10 MEQ/50ML IV SOLN
10.0000 meq | INTRAVENOUS | Status: AC
  Administered 2020-03-07 (×3): 10 meq via INTRAVENOUS

## 2020-03-07 NOTE — Progress Notes (Signed)
      [address]       [place] [address]             [phone]                 1 Day Post-Op Procedure(s) (LRB): MINIMALLY INVASIVE MITRAL VALVE (MV) REPLACEMENT using Carbomedics Optiform 33 MM Mitral Bioprosthetic Valve. (Right) TRANSESOPHAGEAL ECHOCARDIOGRAM (TEE) (N/A)   Events: No events.   _______________________________________________________________ Vitals: BP 137/83   Pulse 84   Temp 98.8 F (37.1 C)   Resp 16   Ht 5\' 9"  (1.753 m)   Wt 85.1 kg   SpO2 96%   BMI 27.71 kg/m   - Neuro: alert NAD  - Cardiovascular: sinus  Drips: nitro.   PAP: (31-57)/(15-34) 37/23 CO:  [5 L/min-7.9 L/min] 7.3 L/min CI:  [2.5 L/min/m2-4 L/min/m2] 3.7 L/min/m2  - Pulm: EWOB, clear    ABG    Component Value Date/Time   PHART 7.329 (L) 03/06/2020 1537   PCO2ART 46.4 03/06/2020 1537   PO2ART 108 03/06/2020 1537   HCO3 24.7 03/06/2020 1537   TCO2 26 03/06/2020 1537   ACIDBASEDEF 2.0 03/06/2020 1537   O2SAT 98.0 03/06/2020 1537    - Abd: soft - Extremity: warm  .Intake/Output      05/28 0701 - 05/29 0700 05/29 0701 - 05/30 0700   I.V. (mL/kg) 4112.5 (48.3) 130.8 (1.5)   Blood 284    IV Piggyback 1456.5 39.9   Total Intake(mL/kg) 5853 (68.8) 170.6 (2)   Urine (mL/kg/hr) 4450 (2.2)    Chest Tube 390 100   Total Output 4840 100   Net +1013 +70.6           _______________________________________________________________ Labs: CBC Latest Ref Rng & Units 03/07/2020 03/06/2020 03/06/2020  WBC 4.0 - 10.5 K/uL 17.7(H) 19.3(H) -  Hemoglobin 13.0 - 17.0 g/dL 03/08/2020) 10.3(L) 11.6(L)  Hematocrit 39.0 - 52.0 % 30.9(L) 32.2(L) 34.0(L)  Platelets 150 - 400 K/uL 144(L) 157 -   CMP Latest Ref Rng & Units 03/07/2020 03/06/2020 03/06/2020  Glucose 70 - 99 mg/dL 03/08/2020) 295(J) -  BUN 6 - 20 mg/dL 10 10 -  Creatinine 884(Z - 1.24 mg/dL 6.60 6.30 -  Sodium 1.60 - 145 mmol/L 134(L) 137 140  Potassium 3.5 - 5.1 mmol/L 3.9 4.3 4.5  Chloride 98 - 111 mmol/L 102 104 -    CO2 22 - 32 mmol/L 24 24 -  Calcium 8.9 - 10.3 mg/dL 8.9 9.0 -  Total Protein 6.5 - 8.1 g/dL - - -  Total Bilirubin 0.3 - 1.2 mg/dL - - -  Alkaline Phos 38 - 126 U/L - - -  AST 15 - 41 U/L - - -  ALT 0 - 44 U/L - - -    CXR: Small L effusion  _______________________________________________________________  Assessment and Plan: POD 1 s/p mini MVR, mechanical  Neuro: pain controlled CV: starting home amlodipine dose.  On coumadin.  Will remove line Pulm: continue pulm toilet Renal: will diurese today GI: advancing diet Heme: stable ID: [email or web address] Endo: SSI Dispo: floor today  109, MD 03/07/2020 10:50 AM

## 2020-03-07 NOTE — Progress Notes (Signed)
Patient arrived to 4E room 09. CHG bath completed and gown changed. Tele applied and CCMD notified. Patient attached to temporary epicardial pacemaker set to AAI at a rate of 50. VS stable. HR 88 and NSR on the monitor. BP 137/86 (103). SpO2 98% on 5L [place]. Patient has 2 chest tubes set to suction at 20 cm H20. Patient oriented to room, bed and call bell. Will continue to monitor.  Willa Frater, RN

## 2020-03-08 ENCOUNTER — Inpatient Hospital Stay (HOSPITAL_COMMUNITY): Payer: No Typology Code available for payment source

## 2020-03-08 LAB — CBC
HCT: 34.4 % — ABNORMAL LOW (ref 39.0–52.0)
Hemoglobin: 11 g/dL — ABNORMAL LOW (ref 13.0–17.0)
MCH: 28.2 pg (ref 26.0–34.0)
MCHC: 32 g/dL (ref 30.0–36.0)
MCV: 88.2 fL (ref 80.0–100.0)
Platelets: 149 10*3/uL — ABNORMAL LOW (ref 150–400)
RBC: 3.9 MIL/uL — ABNORMAL LOW (ref 4.22–5.81)
RDW: 12.6 % (ref 11.5–15.5)
WBC: 25.3 10*3/uL — ABNORMAL HIGH (ref 4.0–10.5)
nRBC: 0 % (ref 0.0–0.2)

## 2020-03-08 LAB — GLUCOSE, CAPILLARY
Glucose-Capillary: 141 mg/dL — ABNORMAL HIGH (ref 70–99)
Glucose-Capillary: 133 mg/dL — ABNORMAL HIGH (ref 70–99)
Glucose-Capillary: 122 mg/dL — ABNORMAL HIGH (ref 70–99)
Glucose-Capillary: 108 mg/dL — ABNORMAL HIGH (ref 70–99)

## 2020-03-08 LAB — BASIC METABOLIC PANEL
Anion gap: 10 (ref 5–15)
BUN: 9 mg/dL (ref 6–20)
CO2: 33 mmol/L — ABNORMAL HIGH (ref 22–32)
Calcium: 9.7 mg/dL (ref 8.9–10.3)
Chloride: 94 mmol/L — ABNORMAL LOW (ref 98–111)
Creatinine, Ser: 0.73 mg/dL (ref 0.61–1.24)
GFR calc Af Amer: >60 mL/min (ref >60)
GFR calc non Af Amer: >60 mL/min (ref >60)
Glucose, Bld: 127 mg/dL — ABNORMAL HIGH (ref 70–99)
Potassium: 3.7 mmol/L (ref 3.5–5.1)
Sodium: 137 mmol/L (ref 135–145)

## 2020-03-08 LAB — PROTIME-INR
INR: 1.2 (ref 0.8–1.2)
Prothrombin Time: 14.3 seconds (ref 11.4–15.2)

## 2020-03-08 MED ORDER — POTASSIUM CHLORIDE CRYS ER 20 MEQ PO TBCR
40.0000 meq | EXTENDED_RELEASE_TABLET | Freq: Once | ORAL | Status: AC
  Administered 2020-03-08: 40 meq via ORAL
  Filled 2020-03-08: qty 2

## 2020-03-08 MED ORDER — AMIODARONE HCL IN DEXTROSE 360-4.14 MG/200ML-% IV SOLN
60.0000 mg/h | INTRAVENOUS | Status: AC
  Administered 2020-03-08: 60 mg/h via INTRAVENOUS

## 2020-03-08 MED ORDER — AMIODARONE HCL IN DEXTROSE 360-4.14 MG/200ML-% IV SOLN
30.0000 mg/h | INTRAVENOUS | Status: DC
  Administered 2020-03-08 – 2020-03-09 (×4): 30 mg/h via INTRAVENOUS
  Filled 2020-03-08 (×5): qty 200

## 2020-03-08 NOTE — Progress Notes (Addendum)
      [address]       [place] [address]             [phone]      2 Days Post-Op Procedure(s) (LRB): MINIMALLY INVASIVE MITRAL VALVE (MV) REPLACEMENT using Carbomedics Optiform 33 MM Mitral Bioprosthetic Valve. (Right) TRANSESOPHAGEAL ECHOCARDIOGRAM (TEE) (N/A) Subjective: Pain around the chest tube site and with deep breaths.   Objective: Vital signs in last 24 hours: Temp:  [98.2 F (36.8 C)-99.7 F (37.6 C)] 99.7 F (37.6 C) (05/30 0844) Pulse Rate:  [80-142] 97 (05/30 0844) Cardiac Rhythm: Atrial fibrillation (05/30 0818) Resp:  [12-25] 18 (05/30 0844) BP: (124-184)/(68-93) 133/87 (05/30 0844) SpO2:  [87 %-99 %] 99 % (05/30 0844) Arterial Line BP: (137-192)/(65-91) 192/91 (05/29 1600) Weight:  [79.6 kg] 79.6 kg (05/30 0359)  Hemodynamic parameters for last 24 hours: PAP: (47)/(23) 47/23  Intake/Output from previous day: 05/29 0701 - 05/30 0700 In: 1265.8 [P.O.:720; I.V.:305.9; IV Piggyback:239.9] Out: 4185 [Urine:4025; Chest Tube:160] Intake/Output this shift: Total I/O In: 120 [P.O.:120] Out: 685 [Urine:675; Chest Tube:10]  General appearance: alert, cooperative and no distress Heart: regular rate and rhythm, S1, S2 normal, no murmur, click, rub or gallop Lungs: clear to auscultation bilaterally and diminished in the lower lobes Abdomen: soft, non-tender; bowel sounds normal; no masses,  no organomegaly Extremities: extremities normal, atraumatic, no cyanosis or edema Wound: clean and dry dressed with a sterile dressing  Lab Results: Recent Labs    03/07/20 2143 03/08/20 0246  WBC 26.5* 25.3*  HGB 11.2* 11.0*  HCT 35.2* 34.4*  PLT 161 149*   BMET:  Recent Labs    03/07/20 2143 03/08/20 0246  NA 134* 137  K 3.6 3.7  CL 94* 94*  CO2 27 33*  GLUCOSE 137* 127*  BUN 9 9  CREATININE 0.86 0.73  CALCIUM 9.5 9.7    PT/INR:  Recent Labs    03/08/20 0246  LABPROT 14.3  INR 1.2   ABG    Component Value Date/Time   PHART  7.329 (L) 03/06/2020 1537   HCO3 24.7 03/06/2020 1537   TCO2 26 03/06/2020 1537   ACIDBASEDEF 2.0 03/06/2020 1537   O2SAT 98.0 03/06/2020 1537   CBG (last 3)  Recent Labs    03/07/20 1556 03/07/20 2015 03/08/20 0617  GLUCAP 149* 132* 141*    Assessment/Plan:  S/P Procedure(s) (LRB): MINIMALLY INVASIVE MITRAL VALVE (MV) REPLACEMENT using Carbomedics Optiform 33 MM Mitral Bioprosthetic Valve. (Right) TRANSESOPHAGEAL ECHOCARDIOGRAM (TEE) (N/A)  1. CV-Afib this morning. Amio started. Now patient in NSR in the 70s, BP stable. Not on BB?  2. Pulm- CXR showed: Bibasilar atelectasis, with improvement on the left. No pneumothorax. On 5L [place]. Only 10cc out of chest tubes recorded today.   3. Renal-Hypokalemia-extra dose of potassium given. Remains on Lasix 20mg  TID 4. H and H 11.0/34.4, expected acute blood loss anemia 5. Endo-blood glucose well controlled 6. INR 1.2, continue Coumadin 5mg   Plan: Should be able to start low-dose BB. Discontinue chest tubes. Discussed use of incentive spirometer and flutter valve to enable weaning of the oxygen. Continue lasix for fluid overload. Replacing potassium. Encouraged to ambulate today. Making slow and steady progress.    LOS: 2 days    12-18-1984 03/08/2020  A. fib  Overnight Started on amnio protocol rate control this morning Diuresing. We will remove chest tubes.  Melvin Burns Sharlene Dory

## 2020-03-08 NOTE — Progress Notes (Signed)
CCMD called to notify RN that patient converted to Afib. EKG completed and placed in chart. EKG confirms Afib RVR with HR 130. Dr. Cliffton Asters, MD notified. MD ordered initiation of Amiodarone protocol. Amiodarone drip started at 60mg /hr per protocol. Will continue to monitor.  , RN

## 2020-03-08 NOTE — Progress Notes (Signed)
  Amiodarone Drug - Drug Interaction Consult Note  Recommendations: Monitor for now Amiodarone is metabolized by the cytochrome P450 system and therefore has the potential to cause many drug interactions. Amiodarone has an average plasma half-life of 50 days (range 20 to 100 days).   There is potential for drug interactions to occur several weeks or months after stopping treatment and the onset of drug interactions may be slow after initiating amiodarone.    [x]  Anticoagulants: Amiodarone can increase anticoagulant effect. Consider warfarin dose reduction. Patients should be monitored closely and the dose of anticoagulant altered accordingly, remembering that amiodarone levels take several weeks to stabilize.  [x]  Beta blockers: increased risk of bradycardia, AV block and myocardial depression. Sotalol - avoid concomitant use.   [x]  Diuretics: increased risk of cardiotoxicity if hypokalemia occurs.   Thank You,  , PharmD, BCPS Please see amion for complete clinical pharmacist phone list 03/08/2020 6:10 AM

## 2020-03-08 NOTE — Progress Notes (Signed)
Patient had nonsustained HR 160 for 5 times. MD paged. Dr. Cliffton Asters made aware and advised to administer PRN dose of 5 mg Lopressor. Patient HR has returned to the 90s. Will continue to monitor.  Willa Frater, RN

## 2020-03-08 NOTE — Progress Notes (Signed)
Pt's chest tube removed today per MD order.  No complications to report at this time.  Pt tolerated well.  Pt will be monitored and on temporary bed rest per protocol.

## 2020-03-09 ENCOUNTER — Encounter: Payer: Self-pay | Admitting: *Deleted

## 2020-03-09 ENCOUNTER — Inpatient Hospital Stay (HOSPITAL_COMMUNITY): Payer: No Typology Code available for payment source

## 2020-03-09 LAB — GLUCOSE, CAPILLARY
Glucose-Capillary: 119 mg/dL — ABNORMAL HIGH (ref 70–99)
Glucose-Capillary: 128 mg/dL — ABNORMAL HIGH (ref 70–99)
Glucose-Capillary: 121 mg/dL — ABNORMAL HIGH (ref 70–99)
Glucose-Capillary: 130 mg/dL — ABNORMAL HIGH (ref 70–99)

## 2020-03-09 LAB — BASIC METABOLIC PANEL
Anion gap: 13 (ref 5–15)
BUN: 13 mg/dL (ref 6–20)
CO2: 31 mmol/L (ref 22–32)
Calcium: 9.8 mg/dL (ref 8.9–10.3)
Chloride: 95 mmol/L — ABNORMAL LOW (ref 98–111)
Creatinine, Ser: 1.05 mg/dL (ref 0.61–1.24)
GFR calc Af Amer: >60 mL/min (ref >60)
GFR calc non Af Amer: >60 mL/min (ref >60)
Glucose, Bld: 118 mg/dL — ABNORMAL HIGH (ref 70–99)
Potassium: 3.3 mmol/L — ABNORMAL LOW (ref 3.5–5.1)
Sodium: 139 mmol/L (ref 135–145)

## 2020-03-09 LAB — MAGNESIUM: Magnesium: 1.9 mg/dL (ref 1.7–2.4)

## 2020-03-09 LAB — PROTIME-INR
INR: 1.2 (ref 0.8–1.2)
Prothrombin Time: 14.9 seconds (ref 11.4–15.2)

## 2020-03-09 MED ORDER — POTASSIUM CHLORIDE CRYS ER 20 MEQ PO TBCR
40.0000 meq | EXTENDED_RELEASE_TABLET | Freq: Three times a day (TID) | ORAL | Status: AC
  Administered 2020-03-09 (×2): 40 meq via ORAL
  Filled 2020-03-09 (×2): qty 2

## 2020-03-09 MED ORDER — MAGNESIUM SULFATE 2 GM/50ML IV SOLN
2.0000 g | Freq: Once | INTRAVENOUS | Status: AC
  Administered 2020-03-09: 2 g via INTRAVENOUS
  Filled 2020-03-09: qty 50

## 2020-03-09 NOTE — Progress Notes (Addendum)
      [address]       [place] [address]             [phone]      3 Days Post-Op Procedure(s) (LRB): MINIMALLY INVASIVE MITRAL VALVE (MV) REPLACEMENT using Carbomedics Optiform 33 MM Mitral Bioprosthetic Valve. (Right) TRANSESOPHAGEAL ECHOCARDIOGRAM (TEE) (N/A) Subjective: Feels like its is easier to take a deep breath since his chest tubes were removed.   Objective: Vital signs in last 24 hours: Temp:  [98.1 F (36.7 C)-99.7 F (37.6 C)] 98.7 F (37.1 C) (05/31 0025) Pulse Rate:  [63-115] 108 (05/31 0025) Cardiac Rhythm: Atrial fibrillation (05/31 0742) Resp:  [14-20] 14 (05/31 0025) BP: (114-133)/(62-88) 121/75 (05/31 0025) SpO2:  [96 %-99 %] 99 % (05/31 0025) Weight:  [76.8 kg] 76.8 kg (05/31 0552)     Intake/Output from previous day: 05/30 0701 - 05/31 0700 In: 798.8 [P.O.:360; I.V.:438.8] Out: 1680 [Urine:1550; Chest Tube:130] Intake/Output this shift: No intake/output data recorded.  General appearance: alert, cooperative and no distress Heart: irregularly irregular rhythm Lungs: CTA bilaterally, diminished in the lower lobes Abdomen: soft, non-tender; bowel sounds normal; no masses,  no organomegaly Extremities: extremities normal, atraumatic, no cyanosis or edema Wound: clean and dry  Lab Results: Recent Labs    03/07/20 2143 03/08/20 0246  WBC 26.5* 25.3*  HGB 11.2* 11.0*  HCT 35.2* 34.4*  PLT 161 149*   BMET:  Recent Labs    03/07/20 2143 03/08/20 0246  NA 134* 137  K 3.6 3.7  CL 94* 94*  CO2 27 33*  GLUCOSE 137* 127*  BUN 9 9  CREATININE 0.86 0.73  CALCIUM 9.5 9.7    PT/INR:  Recent Labs    03/09/20 0305  LABPROT 14.9  INR 1.2   ABG    Component Value Date/Time   PHART 7.329 (L) 03/06/2020 1537   HCO3 24.7 03/06/2020 1537   TCO2 26 03/06/2020 1537   ACIDBASEDEF 2.0 03/06/2020 1537   O2SAT 98.0 03/06/2020 1537   CBG (last 3)  Recent Labs    03/08/20 1645 03/08/20 2141 03/09/20 0629  GLUCAP 122*  108* 119*    Assessment/Plan: S/P Procedure(s) (LRB): MINIMALLY INVASIVE MITRAL VALVE (MV) REPLACEMENT using Carbomedics Optiform 33 MM Mitral Bioprosthetic Valve. (Right) TRANSESOPHAGEAL ECHOCARDIOGRAM (TEE) (N/A)  1. CV-Afib this morning-now with better rate. Amio IV.  BP well controlled. Continue ASA and Norvasc.  2. Pulm- Chest tubes removed. CXR pending.   3. Renal-Hypokalemia-extra dose of potassium given. Remains on Lasix 20mg  TID 4. H and H 11.0/34.4, expected acute blood loss anemia 5. Endo-blood glucose well controlled 6. INR 1.2, continue Coumadin 5mg    Plan: Continue Amio load IV. Rate in the low 100s. Still has EPW attached to pacer box on back-up of 50. Will order a magnesium level. Continue diuresis. OOB to chair. He did walk some around the room yesterday but none in the halls. Encouraged to do so today.    LOS: 3 days    12-18-1984 03/09/2020  Wants to know when he can go home  Chest tubes out  pw still in connected to pacer box  inr 1.2 I have seen and examined Melvin Burns and agree with the above assessment  and plan.  Sharlene Dory MD Beeper [phone] Office [phone] 03/09/2020 12:22 PM

## 2020-03-09 NOTE — Progress Notes (Deleted)
Anesthesiology Follow-up:  54 year old male three days S/P MV replacement for rheumatic MS. Patient extubated in OR. Now on 3E, awake and alert, minimal pain, neuro intact, went into Afib on POD #2, now on amiodarone rate controlled adequately.  VS: T- 36.9 BP- 120/74 RR- 20 O2 Sat 97% on RA HR- 95 (AFib)K-3.3 BUN/Cr.- 13/1.05 glucose- 118 WBC- 25,300 H/H- 11.0/34.4 platelets- 149,000 PT/INR- 14.9/1.2  CXR- chest tubes removed, minimal bilateral atelectasis, heart size OK  54 year old male 3 days S/P MVR with Optiform bileaflet mechanical valve. Post-op course complicated by AFib. Otherwise doing well.  Kipp Brood

## 2020-03-09 NOTE — Plan of Care (Signed)
°  Problem: Education: °Goal: Knowledge of General Education information will improve °Description: Including pain rating scale, medication(s)/side effects and non-pharmacologic comfort measures °Outcome: Not Progressing °  °Problem: Health Behavior/Discharge Planning: °Goal: Ability to manage health-related needs will improve °Outcome: Not Progressing °  °Problem: Clinical Measurements: °Goal: Ability to maintain clinical measurements within normal limits will improve °Outcome: Not Progressing °Goal: Will remain free from infection °Outcome: Not Progressing °Goal: Diagnostic test results will improve °Outcome: Not Progressing °Goal: Respiratory complications will improve °Outcome: Not Progressing °Goal: Cardiovascular complication will be avoided °Outcome: Not Progressing °  °Problem: Activity: °Goal: Risk for activity intolerance will decrease °Outcome: Not Progressing °  °Problem: Nutrition: °Goal: Adequate nutrition will be maintained °Outcome: Not Progressing °  °Problem: Coping: °Goal: Level of anxiety will decrease °Outcome: Not Progressing °  °Problem: Elimination: °Goal: Will not experience complications related to bowel motility °Outcome: Not Progressing °Goal: Will not experience complications related to urinary retention °Outcome: Not Progressing °  °Problem: Pain Managment: °Goal: General experience of comfort will improve °Outcome: Not Progressing °  °Problem: Safety: °Goal: Ability to remain free from injury will improve °Outcome: Not Progressing °  °Problem: Skin Integrity: °Goal: Risk for impaired skin integrity will decrease °Outcome: Not Progressing °  °Problem: Education: °Goal: Will demonstrate proper wound care and an understanding of methods to prevent future damage °Outcome: Not Progressing °Goal: Knowledge of disease or condition will improve °Outcome: Not Progressing °Goal: Knowledge of the prescribed therapeutic regimen will improve °Outcome: Not Progressing °Goal: Individualized  Educational Video(s) °Outcome: Not Progressing °  °Problem: Activity: °Goal: Risk for activity intolerance will decrease °Outcome: Not Progressing °  °Problem: Cardiac: °Goal: Will achieve and/or maintain hemodynamic stability °Outcome: Not Progressing °  °Problem: Clinical Measurements: °Goal: Postoperative complications will be avoided or minimized °Outcome: Not Progressing °  °Problem: Respiratory: °Goal: Respiratory status will improve °Outcome: Not Progressing °  °Problem: Skin Integrity: °Goal: Wound healing without signs and symptoms of infection °Outcome: Not Progressing °Goal: Risk for impaired skin integrity will decrease °Outcome: Not Progressing °  °Problem: Urinary Elimination: °Goal: Ability to achieve and maintain adequate renal perfusion and functioning will improve °Outcome: Not Progressing °  °

## 2020-03-09 NOTE — Progress Notes (Signed)
Anesthesiology Follow-up:  54 year old male three days S/P MV replacement for rheumatic MS. Patient extubated in OR. Now on 4E, awake and alert, minimal pain, neuro intact, went into Afib on POD #2, now on amiodarone rate controlled adequately.  VS: T- 36.9 BP- 120/74 RR- 20 O2 Sat 97% on RA HR- 95 (AFib)K-3.3 BUN/Cr.- 13/1.05 glucose- 118 WBC- 25,300 H/H- 11.0/34.4 platelets- 149,000 PT/INR- 14.9/1.2  CXR- chest tubes removed, minimal bilateral atelectasis, heart size OK  54 year old male 3 days S/P MVR with Optiform bileaflet mechanical valve. Post-op course complicated by AFib. Otherwise doing well.  Kipp Brood

## 2020-03-10 LAB — URINALYSIS, ROUTINE W REFLEX MICROSCOPIC
Bilirubin Urine: NEGATIVE
Glucose, UA: NEGATIVE mg/dL
Hgb urine dipstick: NEGATIVE
Ketones, ur: NEGATIVE mg/dL
Leukocytes,Ua: NEGATIVE
Nitrite: NEGATIVE
Protein, ur: 30 mg/dL — AB
Specific Gravity, Urine: 1.028 (ref 1.005–1.030)
pH: 7 (ref 5.0–8.0)

## 2020-03-10 LAB — BASIC METABOLIC PANEL
Anion gap: 10 (ref 5–15)
BUN: 14 mg/dL (ref 6–20)
CO2: 32 mmol/L (ref 22–32)
Calcium: 9.3 mg/dL (ref 8.9–10.3)
Chloride: 94 mmol/L — ABNORMAL LOW (ref 98–111)
Creatinine, Ser: 0.92 mg/dL (ref 0.61–1.24)
GFR calc Af Amer: >60 mL/min (ref >60)
GFR calc non Af Amer: >60 mL/min (ref >60)
Glucose, Bld: 108 mg/dL — ABNORMAL HIGH (ref 70–99)
Potassium: 3.2 mmol/L — ABNORMAL LOW (ref 3.5–5.1)
Sodium: 136 mmol/L (ref 135–145)

## 2020-03-10 LAB — GLUCOSE, CAPILLARY
Glucose-Capillary: 121 mg/dL — ABNORMAL HIGH (ref 70–99)
Glucose-Capillary: 136 mg/dL — ABNORMAL HIGH (ref 70–99)
Glucose-Capillary: 101 mg/dL — ABNORMAL HIGH (ref 70–99)
Glucose-Capillary: 134 mg/dL — ABNORMAL HIGH (ref 70–99)

## 2020-03-10 LAB — MAGNESIUM: Magnesium: 2.2 mg/dL (ref 1.7–2.4)

## 2020-03-10 LAB — PROTIME-INR
INR: 1.4 — ABNORMAL HIGH (ref 0.8–1.2)
Prothrombin Time: 16.3 seconds — ABNORMAL HIGH (ref 11.4–15.2)

## 2020-03-10 LAB — SURGICAL PATHOLOGY

## 2020-03-10 MED ORDER — FUROSEMIDE 40 MG PO TABS: 40.0000 mg | ORAL_TABLET | Freq: Every day | ORAL | Status: DC

## 2020-03-10 MED ORDER — METOPROLOL TARTRATE 12.5 MG HALF TABLET
12.5000 mg | ORAL_TABLET | Freq: Two times a day (BID) | ORAL | Status: DC
  Administered 2020-03-10 – 2020-03-12 (×6): 12.5 mg via ORAL
  Filled 2020-03-10 (×6): qty 1

## 2020-03-10 MED ORDER — FUROSEMIDE 40 MG PO TABS
40.0000 mg | ORAL_TABLET | Freq: Every day | ORAL | Status: DC
  Administered 2020-03-11 – 2020-03-18 (×8): 40 mg via ORAL
  Filled 2020-03-10 (×8): qty 1

## 2020-03-10 MED ORDER — AMIODARONE HCL 200 MG PO TABS
400.0000 mg | ORAL_TABLET | Freq: Two times a day (BID) | ORAL | Status: DC
  Administered 2020-03-10 – 2020-03-18 (×17): 400 mg via ORAL
  Filled 2020-03-10 (×17): qty 2

## 2020-03-10 MED ORDER — COUMADIN BOOK
Freq: Once | Status: AC
  Filled 2020-03-10: qty 1

## 2020-03-10 MED ORDER — AMIODARONE HCL IN DEXTROSE 360-4.14 MG/200ML-% IV SOLN
30.0000 mg/h | INTRAVENOUS | Status: AC
  Administered 2020-03-10: 30 mg/h via INTRAVENOUS
  Filled 2020-03-10: qty 200

## 2020-03-10 MED ORDER — POTASSIUM CHLORIDE CRYS ER 20 MEQ PO TBCR
40.0000 meq | EXTENDED_RELEASE_TABLET | Freq: Three times a day (TID) | ORAL | Status: AC
  Administered 2020-03-10 (×2): 40 meq via ORAL
  Filled 2020-03-10 (×2): qty 2

## 2020-03-10 NOTE — Progress Notes (Addendum)
4 Days Post-Op Procedure(s) (LRB): MINIMALLY INVASIVE MITRAL VALVE (MV) REPLACEMENT using Carbomedics Optiform 33 MM Mitral Bioprosthetic Valve. (Right) TRANSESOPHAGEAL ECHOCARDIOGRAM (TEE) (N/A) Subjective:  Awake and alert, denies any pain. He is independent with mobility.  BM last evening.   Objective: Vital signs in last 24 hours: Temp:  [98 F (36.7 C)-98.5 F (36.9 C)] 98.3 F (36.8 C) (06/01 0608) Pulse Rate:  [88-100] 88 (06/01 0608) Cardiac Rhythm: Atrial fibrillation (06/01 0423) Resp:  [13-20] 20 (06/01 7793) BP: (106-120)/(69-82) 109/71 (06/01 0608) SpO2:  [93 %-100 %] 96 % (06/01 9030) Weight:  [76.4 kg] 76.4 kg (06/01 0608)     Intake/Output from previous day: 05/31 0701 - 06/01 0700 In: 54.4 [I.V.:4.4; IV Piggyback:50] Out: 500 [Urine:500] Intake/Output this shift: No intake/output data recorded.  General appearance: alert, cooperative and no distress Neurologic: intact Heart: A-flutter with VR 90-125.  Lungs: BS CTA.  Abdomen: Soft, NT, active BS.  Extremities: Well perfused, no edema Wound: The right chest incision is intact and dry.   Lab Results: Recent Labs    03/07/20 2143 03/08/20 0246  WBC 26.5* 25.3*  HGB 11.2* 11.0*  HCT 35.2* 34.4*  PLT 161 149*   BMET:  Recent Labs    03/09/20 0305 03/10/20 0342  NA 139 136  K 3.3* 3.2*  CL 95* 94*  CO2 31 32  GLUCOSE 118* 108*  BUN 13 14  CREATININE 1.05 0.92  CALCIUM 9.8 9.3    PT/INR:  Recent Labs    03/10/20 0342  LABPROT 16.3*  INR 1.4*   ABG    Component Value Date/Time   PHART 7.329 (L) 03/06/2020 1537   HCO3 24.7 03/06/2020 1537   TCO2 26 03/06/2020 1537   ACIDBASEDEF 2.0 03/06/2020 1537   O2SAT 98.0 03/06/2020 1537   CBG (last 3)  Recent Labs    03/09/20 1706 03/09/20 2156 03/10/20 0628  GLUCAP 121* 130* 121*    Assessment/Plan: S/P Procedure(s) (LRB): MINIMALLY INVASIVE MITRAL VALVE (MV) REPLACEMENT using Carbomedics Optiform 33 MM Mitral Bioprosthetic Valve.  (Right) TRANSESOPHAGEAL ECHOCARDIOGRAM (TEE) (N/A)  -POD4 MINI MV replacement for rheumatic valve disease. Progressing well. INR 1.4, continue Coumadin at 5mg  daily.   -Post-op atrial fibrillation / flutter-- rate control variable. Remains on amiodarone drip. Will try adding low-dose metoprolol if BP will tolerate. Correct K+. Convert to oral amiodarone.  -DVT PPX- continue enoxaparin until INR therapeutic.   -Leukocytosis-afebrile and no clinical evidence of infection. Repeat lab in am, re-check CXR.  -Volume excess- WT still 4kg positiive but appears euvolemic. Will back off on IV Lasix switch to 40mg  po daily.    LOS: 4 days    , PA-C [phone] 03/10/2020   On coumadin, convert to po amiodarone Home when inr higher  I have seen and examined Melvin Burns and agree with the above assessment  and plan.  [phone] MD Beeper [phone] Office [phone] 03/10/2020 12:36 PM

## 2020-03-10 NOTE — Progress Notes (Signed)
PA on call notified about a temp of 102.6, pt was given Tylenol for a temp of 101.6 at 1700. PA gave verbal order to order for UA and blood cultures. Called Dr. Vickey Sages who is the MD on the call, informed him and he gave a verbal order to give another 650 mg of Tylenol. Will follow orders and continue to monitor.

## 2020-03-10 NOTE — Progress Notes (Signed)
Mobility Specialist - Progress Note   03/10/20 1345  Mobility  Activity Ambulated in hall  Level of Assistance Independent  Assistive Device None  Distance Ambulated (ft) 510 ft  Mobility Response Tolerated well  Mobility performed by Mobility specialist  $Mobility charge 1 Mobility    Pre-mobility: 94 HR, 98% SpO2 Post-mobility: 91 HR, 98% SPO2  Pt said he only wanted to go down the hallway once as he has been walking independently. After getting him back in his room, I encouraged him to continue walking independently and he agreed to do so. He was oriented to his call bell and phone and was left lying in his bed.  Mamie Levers Mobility Specialist

## 2020-03-10 NOTE — Plan of Care (Signed)

## 2020-03-10 NOTE — Progress Notes (Signed)
EPW pulled per MD order pt tolerated activity well, pt instructed to remain in bed fr 1 hour. Frequent VS will be monitored.

## 2020-03-10 NOTE — Progress Notes (Signed)
CARDIAC REHAB PHASE I   PRE:  Rate/Rhythm: 90 afib    BP: sitting 108/74    SaO2: 96 RA  MODE:  Ambulation: 940 ft   POST:  Rate/Rhythm: 130 afib at max    BP: sitting 126/80     SaO2: 96 RA   Pt moving well. Walking independently. No c/o, HR fairly stable, mostly 120  afib walking. To recliner. Encouraged ambulation, IS today.  [phone]  Harriet Masson CES, ACSM 03/10/2020 9:06 AM

## 2020-03-10 NOTE — Discharge Instructions (Addendum)
Please get blood drawn at [facility] on Friday 6/25.     Discharge Instructions:  1. You may shower, please wash incisions daily with soap and water and keep dry.  If you wish to cover wounds with dressing you may do so but please keep clean and change daily.  No tub baths or swimming until incisions have completely healed.  If your incisions become red or develop any drainage please call our office at [phone]  2. No Driving until cleared by Dr. Orvan July office and you are no longer using narcotic pain medications  3. Monitor your weight daily.. Please use the same scale and weigh at same time... If you gain 5-10 lbs in 48 hours with associated lower extremity swelling, please contact our office at [phone]  4. Fever of 101.5 for at least 24 hours with no source, please contact our office at [phone]  5. Activity- up as tolerated, please walk at least 3 times per day.  Avoid strenuous activity, no lifting, pushing, or pulling with your arms over 8-10 lbs for a minimum of 6 weeks  6. If any questions or concerns arise, please do not hesitate to contact our office at [phone]   Information on my medicine - Coumadin   (Warfarin)  Why was Coumadin prescribed for you? Coumadin was prescribed for you because you have a blood clot or a medical condition that can cause an increased risk of forming blood clots. Blood clots can cause serious health problems by blocking the flow of blood to the heart, lung, or brain. Coumadin can prevent harmful blood clots from forming. As a reminder your indication for Coumadin is:   Stroke Prevention Because Of Atrial Fibrillation  What test will check on my response to Coumadin? While on Coumadin (warfarin) you will need to have an INR test regularly to ensure that your dose is keeping you in the desired range. The INR (international normalized ratio) number is calculated from the result of the laboratory test called prothrombin time  (PT).  If an INR APPOINTMENT HAS NOT ALREADY BEEN MADE FOR YOU please schedule an appointment to have this lab work done by your health care provider within 7 days. Your INR goal is usually a number between:  2 to 3 or your provider may give you a more narrow range like 2-2.5.  Ask your health care provider during an office visit what your goal INR is.  What  do you need to  know  About  COUMADIN? Take Coumadin (warfarin) exactly as prescribed by your healthcare provider about the same time each day.  DO NOT stop taking without talking to the doctor who prescribed the medication.  Stopping without other blood clot prevention medication to take the place of Coumadin may increase your risk of developing a new clot or stroke.  Get refills before you run out.  What do you do if you miss a dose? If you miss a dose, take it as soon as you remember on the same day then continue your regularly scheduled regimen the next day.  Do not take two doses of Coumadin at the same time.  Important Safety Information A possible side effect of Coumadin (Warfarin) is an increased risk of bleeding. You should call your healthcare provider right away if you experience any of the following: ? Bleeding from an injury or your nose that does not stop. ? Unusual colored urine (red or dark brown) or unusual colored stools (red or black). ? Unusual bruising for  unknown reasons. ? A serious fall or if you hit your head (even if there is no bleeding).  Some foods or medicines interact with Coumadin (warfarin) and might alter your response to warfarin. To help avoid this: ? Eat a balanced diet, maintaining a consistent amount of Vitamin K. ? Notify your provider about major diet changes you plan to make. ? Avoid alcohol or limit your intake to 1 drink for women and 2 drinks for men per day. (1 drink is 5 oz. wine, 12 oz. beer, or 1.5 oz. liquor.)  Make sure that ANY health care provider who prescribes medication for you  knows that you are taking Coumadin (warfarin).  Also make sure the healthcare provider who is monitoring your Coumadin knows when you have started a new medication including herbals and non-prescription products.  Coumadin (Warfarin)  Major Drug Interactions  Increased Warfarin Effect Decreased Warfarin Effect  Alcohol (large quantities) Antibiotics (esp. Septra/Bactrim, Flagyl, Cipro) Amiodarone (Cordarone) Aspirin (ASA) Cimetidine (Tagamet) Megestrol (Megace) NSAIDs (ibuprofen, naproxen, etc.) Piroxicam (Feldene) Propafenone (Rythmol SR) Propranolol (Inderal) Isoniazid (INH) Posaconazole (Noxafil) Barbiturates (Phenobarbital) Carbamazepine (Tegretol) Chlordiazepoxide (Librium) Cholestyramine (Questran) Griseofulvin Oral Contraceptives Rifampin Sucralfate (Carafate) Vitamin K   Coumadin (Warfarin) Major Herbal Interactions  Increased Warfarin Effect Decreased Warfarin Effect  Garlic Ginseng Ginkgo biloba Coenzyme Q10 Green tea St. John's wort    Coumadin (Warfarin) FOOD Interactions  Eat a consistent number of servings per week of foods HIGH in Vitamin K (1 serving =  cup)  Collards (cooked, or boiled & drained) Kale (cooked, or boiled & drained) Mustard greens (cooked, or boiled & drained) Parsley *serving size only =  cup Spinach (cooked, or boiled & drained) Swiss chard (cooked, or boiled & drained) Turnip greens (cooked, or boiled & drained)  Eat a consistent number of servings per week of foods MEDIUM-HIGH in Vitamin K (1 serving = 1 cup)  Asparagus (cooked, or boiled & drained) Broccoli (cooked, boiled & drained, or raw & chopped) Brussel sprouts (cooked, or boiled & drained) *serving size only =  cup Lettuce, raw (green leaf, endive, romaine) Spinach, raw Turnip greens, raw & chopped   These websites have more information on Coumadin (warfarin):  FailFactory.se; VeganReport.com.au;

## 2020-03-11 ENCOUNTER — Inpatient Hospital Stay (HOSPITAL_COMMUNITY): Payer: No Typology Code available for payment source

## 2020-03-11 DIAGNOSIS — Z952 Presence of prosthetic heart valve: Secondary | ICD-10-CM

## 2020-03-11 LAB — BLOOD CULTURE ID PANEL (REFLEXED)

## 2020-03-11 LAB — CBC
HCT: 32.4 % — ABNORMAL LOW (ref 39.0–52.0)
Hemoglobin: 10.5 g/dL — ABNORMAL LOW (ref 13.0–17.0)
MCH: 28.5 pg (ref 26.0–34.0)
MCHC: 32.4 g/dL (ref 30.0–36.0)
MCV: 87.8 fL (ref 80.0–100.0)
Platelets: 234 10*3/uL (ref 150–400)
RBC: 3.69 MIL/uL — ABNORMAL LOW (ref 4.22–5.81)
RDW: 12.9 % (ref 11.5–15.5)
WBC: 36.1 10*3/uL — ABNORMAL HIGH (ref 4.0–10.5)
nRBC: 0 % (ref 0.0–0.2)

## 2020-03-11 LAB — BASIC METABOLIC PANEL
Anion gap: 10 (ref 5–15)
BUN: 19 mg/dL (ref 6–20)
CO2: 29 mmol/L (ref 22–32)
Calcium: 9.6 mg/dL (ref 8.9–10.3)
Chloride: 95 mmol/L — ABNORMAL LOW (ref 98–111)
Creatinine, Ser: 1.1 mg/dL (ref 0.61–1.24)
GFR calc Af Amer: >60 mL/min (ref >60)
GFR calc non Af Amer: >60 mL/min (ref >60)
Glucose, Bld: 116 mg/dL — ABNORMAL HIGH (ref 70–99)
Potassium: 4.1 mmol/L (ref 3.5–5.1)
Sodium: 134 mmol/L — ABNORMAL LOW (ref 135–145)

## 2020-03-11 LAB — GLUCOSE, CAPILLARY
Glucose-Capillary: 107 mg/dL — ABNORMAL HIGH (ref 70–99)
Glucose-Capillary: 89 mg/dL (ref 70–99)
Glucose-Capillary: 97 mg/dL (ref 70–99)
Glucose-Capillary: 138 mg/dL — ABNORMAL HIGH (ref 70–99)

## 2020-03-11 LAB — PROTIME-INR
INR: 1.7 — ABNORMAL HIGH (ref 0.8–1.2)
Prothrombin Time: 19.7 seconds — ABNORMAL HIGH (ref 11.4–15.2)

## 2020-03-11 LAB — ECHOCARDIOGRAM COMPLETE
Height: 69 in
Weight: 2709.01 oz

## 2020-03-11 MED ORDER — SODIUM CHLORIDE 0.9 % IV SOLN
2.0000 g | INTRAVENOUS | Status: DC
  Filled 2020-03-11: qty 20

## 2020-03-11 MED ORDER — SODIUM CHLORIDE 0.9 % IV SOLN
2.0000 g | Freq: Three times a day (TID) | INTRAVENOUS | Status: DC
  Filled 2020-03-11 (×2): qty 2

## 2020-03-11 MED ORDER — INSULIN ASPART 100 UNIT/ML ~~LOC~~ SOLN: 0.0000 [IU] | Freq: Three times a day (TID) | SUBCUTANEOUS | Status: DC

## 2020-03-11 MED ORDER — SODIUM CHLORIDE 0.9 % IV SOLN
2.0000 g | INTRAVENOUS | Status: DC
  Filled 2020-03-11 (×2): qty 20

## 2020-03-11 MED ORDER — SODIUM CHLORIDE 0.9 % IV SOLN
2.0000 g | Freq: Three times a day (TID) | INTRAVENOUS | Status: DC
  Administered 2020-03-11 – 2020-03-14 (×9): 2 g via INTRAVENOUS
  Filled 2020-03-11 (×11): qty 2

## 2020-03-11 MED ORDER — IOHEXOL 300 MG/ML  SOLN
100.0000 mL | Freq: Once | INTRAMUSCULAR | Status: AC | PRN
  Administered 2020-03-11: 100 mL via INTRAVENOUS

## 2020-03-11 MED ORDER — CEFTRIAXONE SODIUM 2 G IJ SOLR: 2.0000 g | INTRAMUSCULAR | Status: DC

## 2020-03-11 NOTE — Discharge Summary (Signed)
Physician Discharge Summary  Patient ID: Melvin Burns. Burnside MRN: [id number] DOB/AGE: Dec 29, 1965 54 y.o.  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  Patient states that he does not have any documented history of rheumatic fever although he recalls being very ill with bronchial infection during his teenage years. Several years ago he was noted to have anabnormal EKG and he was referred for cardiology consultation in 2017. He was noted to have a heart murmur on physical exam and echocardiogram at that time revealed likely rheumatic heart disease with severe mitral stenosis,moderate mitral regurgitation, and normal left ventricular systolic function. The patient denied any symptoms at that time and remained quite active physically. In October 2020 he was treated for pneumonia and since then he has noted the development of exertional shortness of breath. He was referred for stress echocardiogram although transvalvular gradient was not measured during stress during the procedure. The patient did develop chest discomfort and short runs of ventricular tachycardia during stress. He was seen in follow-up recently by Dr. Oval Linsey who felt that further diagnostic work-up  to consider elective mitral valve replacement was warranted. TEE and diagnostic cardiac catheterization were performed December 16, 2019. TEE revealed normal left ventricular function with ejection fraction estimated 60 to 65%. There was severe rheumatic mitral valve stenosis with commissural fusion, diastolic doming, moderate leaflet calcification, and mild calcification of the subvalvular apparatus. By planimetry of the mitral valve area measured just under 1.0 cm. There was mild mitral regurgitation. The aortic valve appeared normal. Right ventricular size and function was normal. There was no significant tricuspid regurgitation.Diagnostic cardiac catheterization confirmed the presence of severe mitral stenosis with mean transvalvular gradient measured 10 mmHg corresponding to aortic valve area calculated 1.3 cm. There was moderately elevated pulmonary capillary wedge pressure and mild to moderately elevated right heart pressures with normal cardiac output. There was two-vessel coronary artery disease with 70% stenosis involving ostial portion of the third obtuse marginal branch with otherwise only mild nonobstructive disease in the remaining coronary circulation. Cardiothoracic surgical consultation was requested.  Patient is married and lives locally in [place] with his wife. He moved from [place] to [place] in 1998. He works full-time as a Freight forwarder. He has remained physically active all of his life and up until recently has enjoyed exercise on a regular basis. He states that over the past 6 months he has developed exertional shortness of breath that only occurs with more strenuous exertion, such as going up a flight of stairs. He denies resting shortness of breath, PND, orthopnea, or lower extremity edema. He does report some mild vague discomfort across his chest which seems to occur primarily when he is laying flat. He reports occasional palpitations without  dizzy spells or syncope.  Patient was originally seen in consultation on March 15, 2021and he was last seen here on January 13, 2020 at which time we made tentative  plans for surgery.Since then he apparently has been fully vaccinated for COVID-19 using the [place] vaccine. He returns to our office today with his cousin present for final consultation visit prior to surgery. He reports no new problems or complaints.    Hospital Course:  [name] was admitted to hospital for elective surgery on 03/06/2020.  He was taken to the operating room where minimally invasive mitral valve replacement was accomplished utilizing a 33 mm Sorin [employer] OptiForm bileaflet mechanical valve.  Following the procedure, he separated from cardiopulmonary bypass without difficulty.  He was extubated in the operating room and was later transferred to the cardiovascular ICU.  He remained hemodynamically stable.  He was initially in a sinus rhythm.  By the second postoperative day, he had developed atrial fibrillation.  He was started on amiodarone infusion.  The atrial fibrillation persisted.  Rate control improved as the amiodarone was loaded.  Low-dose metoprolol was added for enhanced rate control and this was also well-tolerated.  Blood cultures were obtained and morning of the following day were positive for gram-negative rods.  The infectious disease service was consulted.  IV Rocephin was started immediately.  Follow-up echocardiogram was obtained.  UA was unremarkable.  Later in the morning on postop day 5, the patient complained of some irritation at one of his peripheral IV site.  When the IV was removed, the nurse reported seeing purulent matter at the site. CTof right hand c/w thrombophlebitis and no fluid collections.  A consult was obtained with Dr. Fredna Dow, hand surgeon. US soft tissue right hand-superficial thrombophlebitis of wrist and hand with subcutaneous edema but no abscess. Patient was put in a wrist  splint, which helped him a lot. His motor/sensory was intact and as swelling decreased, his movement of fingers improved.  continued to remain afebrile and WBC continued to decrease. As of 06/07, WBC was increasing to 28,000.  Infectious disease was re-consulted.  Due the patient's new valve prosthesis the decision was made to plan to discharge the patient home on IV Cefipime.  A PICC line was placed and home health care orders were placed.  He remains on coumadin at 5 mg daily.  His most recent INR is 2.2.  His leukocytosis is recovering with most recent result down to 24.8  His incisions are healing w/o evidence of infection.  His right hand phlebitis is much improved.  He is ambulating independently.  He is medically stable for discharge home today.  Consults: Infectious Disease  Significant Diagnostic Studies:   ECHOCARDIOGRAM REPORT     Patient Name: Arlester Marker Date of Exam: 08/14/2019  Medical Rec #: [id number] Height: 69.0 in  Accession #: [id number] Weight: 182.0 lb  Date of Birth: 1966-08-29 BSA: 1.98 m  Patient Age: 47 years BP: 115/76 mmHg  Patient Gender: M HR: 83 bpm.  Exam Location: [facility]   Procedure: 2D Echo, Cardiac Doppler and Color Doppler   Indications: I05.0 Mitral valve stenosis; Z01.818 Pre-operative  clearance;  I10 Hypertension;   History: Patient has prior history of Echocardiogram examinations,  most  recent 08/10/2018. Mild aortic stenosis.   Sonographer: Diamond Nickel RCS  Referring Phys: [place]    1. Left ventricular ejection fraction, by visual estimation, is 60 to  65%. The left ventricle has normal function. There is mildly increased  left ventricular hypertrophy.  2. Left ventricular diastolic parameters are indeterminate.  3. Global right ventricle has normal systolic function.The right  ventricular size is normal.  No increase in right ventricular wall  thickness.  4. Left atrial size was normal.  5.  Right atrial size was normal.  6. Severe calcification of the mitral valve leaflet(s).  7. Severe thickening of the mitral valve leaflet(s).  8. Severely decreased mobility of the mitral valve leaflets.  9. The mitral valve is rheumatic. Trace mitral valve regurgitation.  Moderate-severe mitral stenosis.  10. The tricuspid valve is normal in structure. Tricuspid valve  regurgitation is trivial.  11. The aortic valve is tricuspid. Aortic valve regurgitation is not  visualized. moderate sclerosis.  12. Small gradient across aortic valve without stenosis.  13. The pulmonic valve was grossly normal. Pulmonic valve regurgitation is  trivial.  14. The inferior vena cava is normal in size with greater than 50%  respiratory variability, suggesting right atrial pressure of 3 mmHg.   FINDINGS  Left Ventricle: Left ventricular ejection fraction, by visual estimation,  is 60 to 65%. The left ventricle has normal function. There is mildly  increased left ventricular hypertrophy. Left ventricular diastolic  parameters are indeterminate. Normal left  atrial pressure.   Right Ventricle: The right ventricular size is normal. No increase in  right ventricular wall thickness. Global RV systolic function is has  normal systolic function.   Left Atrium: Left atrial size was normal in size.   Right Atrium: Right atrial size was normal in size   Pericardium: There is no evidence of pericardial effusion.   Mitral Valve: The mitral valve is rheumatic. There is severe thickening of  the mitral valve leaflet(s). There is severe calcification of the mitral  valve leaflet(s). Severely decreased mobility of the mitral valve  leaflets. Moderate-severe mitral valve  stenosis by observation. MV peak gradient, 17.0 mmHg. Trace mitral valve  regurgitation.   Tricuspid Valve: The tricuspid valve is normal in structure. Tricuspid  valve regurgitation is trivial.   Aortic Valve: The aortic valve is tricuspid.  Aortic valve regurgitation is  not visualized. Moderate sclerosis. Aortic valve mean gradient measures  8.0 mmHg. Aortic valve peak gradient measures 16.2 mmHg. Aortic valve  area, by VTI measures 2.26 cm. Small  gradient across aortic valve without stenosis.   Pulmonic Valve: The pulmonic valve was grossly normal. Pulmonic valve  regurgitation is trivial.   Aorta: The aortic root, ascending aorta and aortic arch are all  structurally normal, with no evidence of dilitation or obstruction.   Venous: The inferior vena cava is normal in size with greater than 50%  respiratory variability, suggesting right atrial pressure of 3 mmHg.   IAS/Shunts: No atrial level shunt detected by color flow Doppler. No  ventricular septal defect is seen or detected. There is no evidence of an  atrial septal defect.     LEFT VENTRICLE  PLAX 2D  LVIDd: 3.92 cm  LVIDs: 2.29 cm  LV PW: 1.18 cm  LV IVS: 1.26 cm  LVOT diam: 2.05 cm  LV SV: 49 ml  LV SV Index: 24.16  LVOT Area: 3.30 cm    RIGHT VENTRICLE  RV Basal diam: 2.99 cm  RV S prime: 11.77 cm/s  TAPSE (M-mode): 1.8 cm   LEFT ATRIUM Index RIGHT ATRIUM Index  LA diam: 5.50 cm 2.77 cm/m RA Area: 12.40 cm  LA Vol (A2C): 95.3 ml 48.01 ml/m RA Volume: 27.00 ml 13.60 ml/m  LA Vol (A4C): 88.1 ml 44.39 ml/m  LA Biplane Vol: 92.8 ml 46.75 ml/m  AORTIC VALVE  AV Area (Vmax): 2.12 cm  AV Area (Vmean): 2.09 cm  AV Area (VTI): 2.26 cm  AV Vmax: 201.00 cm/s  AV Vmean: 126.000 cm/s  AV VTI: 0.334 m  AV Peak Grad: 16.2 mmHg  AV Mean Grad: 8.0 mmHg  LVOT Vmax: 129.00 cm/s  LVOT Vmean: 79.800 cm/s  LVOT VTI: 0.229 m  LVOT/AV VTI ratio: 0.69   AORTA  Ao Root diam: 2.90 cm   MITRAL VALVE  MV Area (PHT): 1.40 cm SHUNTS  MV Peak grad: 17.0 mmHg Systemic VTI: 0.23 m  MV Mean grad: 8.0 mmHg Systemic Diam: 2.05 cm  MV Vmax: 2.06 m/s  MV Vmean: 133.0 cm/s  MV VTI: 0.72 m  MV PHT: 157.18 msec  MV Decel Time: 542 msec  MV E velocity:  148.00 cm/s 103 cm/s  MV A velocity: 197.00 cm/s 70.3 cm/s  MV E/A ratio: 0.75 1.5    Jenkins Rouge MD  Electronically signed by Jenkins Rouge MD  Signature Date/Time: 08/14/2019/4:59:31 PM  EXERCISE STRESS REPORT     Treatments:   CARDIOTHORACIC SURGERY OPERATIVE NOTE  Date of Procedure:                03/06/2020  Preoperative Diagnosis:        Rheumatic Heart Disease  Severe Mitral Stenosis and Moderate Mitral Regurgitation  Postoperative Diagnosis:    Same  Procedure:        Minimally-Invasive Mitral Valve Replacement             Sorin [employer] Optiform bileaflet mechanical valve (size 107m, catalog # [id number] serial # [id number]               Surgeon:        CValentina Gu ORoxy Manns MD  Assistant:       MEnid Cutter PA-C  Anesthesia:    DRoberts Gaudy MD  Operative Findings: ? Rheumatic mitral valve disease ? Severe mitral stenosis and moderate mitral regurgitation ? Normal left ventricular systolic function ? Dense adhesions surrounding the right middle lobe and the right lower lobe requiring surgical division for exposure ? Dense adhesions obliterating the pericardial space consistent with remote history of pericarditis              Discharge Exam: Blood pressure 108/71, pulse 73, temperature 98.6 F (37 C), temperature source Oral, resp. rate 16, height '5\' 9"'  (1.753 m), weight 75.6 kg, SpO2 96 %.  General appearance: alert, cooperative and no distress Heart: regular rate and rhythm Lungs: clear to auscultation bilaterally Abdomen: soft, non-tender; bowel sounds normal; no masses,  no organomegaly Extremities: extremities normal, atraumatic, no cyanosis or edema Wound: clean and dry  Discharge Medications:  The patient has been discharged on:   1.Beta Blocker:  Yes [ X  ]                              No   [   ]                              If No, reason:  2.Ace Inhibitor/ARB: Yes [ X  ]                                      No  [    ]  If No, reason:  3.Statin:   Yes [   ]                  No  [ X  ]                  If No, reason: No CAD  4.Shela Commons:  Yes  [ x  ]                  No   [   ]                  If No, reason:      Discharge Instructions    [facility] pharmacist to adjust dose for Vancomycin, Aminoglycosides and other anti-infective therapies as requested by physician.   Complete by: As directed    [facility] to provide Cath Flo 88m   Complete by: As directed    Administer for PICC line occlusion and as ordered by physician for other access device issues.   Amb Referral to Cardiac Rehabilitation   Complete by: As directed    Diagnosis: Valve Replacement   Valve: Mitral Comment - mini   After initial evaluation and assessments completed: Virtual Based Care may be provided alone or in conjunction with Phase 2 Cardiac Rehab based on patient barriers.: Yes   Anaphylaxis Kit: Provided to treat any anaphylactic reaction to the medication being provided to the patient if First Dose or when requested by physician   Complete by: As directed    Epinephrine 125mml vial / amp: Administer 0.13m74m0.13ml44mubcutaneously once for moderate to severe anaphylaxis, nurse to call physician and pharmacy when reaction occurs and call 911 if needed for immediate care   Diphenhydramine 50mg313mIV vial: Administer 25-50mg 44mM PRN for first dose reaction, rash, itching, mild reaction, nurse to call physician and pharmacy when reaction occurs   Sodium Chloride 0.9% NS 500ml I1mdminister if needed for hypovolemic blood pressure drop or as ordered by physician after call to physician with anaphylactic reaction   Change dressing on IV access line weekly and PRN   Complete by: As directed    Flush IV access with Sodium Chloride 0.9% and Heparin 10 units/ml or 100 units/ml   Complete by: As directed    Home infusion instructions - [facility]    Complete by: As directed    Instructions: Flush IV access with Sodium Chloride 0.9% and Heparin 10units/ml or 100units/ml   Change dressing on IV access line: Weekly and PRN   Instructions Cath Flo 2mg: Ad45mister for PICC Line occlusion and as ordered by physician for other access device   [facility] pharmacist to adjust dose for: Vancomycin, Aminoglycosides and other anti-infective therapies as requested by physician   Method of administration may be changed at the discretion of home infusion pharmacist based upon assessment of the patient and/or caregiver's ability to self-administer the medication ordered   Complete by: As directed      Allergies as of 03/17/2020   No Known Allergies     Medication List    STOP taking these medications   amLODipine 10 MG tablet Commonly known as: NORVASC   chlorthalidone 25 MG tablet Commonly known as: HYGROTON     TAKE these medications   amiodarone 200 MG tablet Commonly known as: PACERONE Take 1 tablet (200 mg total) by mouth 2 (two) times daily. X 7 days, then decrease to 200 mg ( 1  tablet) daily   aspirin EC 81 MG tablet Take 1 tablet (81 mg total) by mouth daily.   ceFEPime  IVPB Commonly known as: MAXIPIME Inject 2 g into the vein every 8 (eight) hours for 11 days. Indication:  Gram-negative bacteremia  First Dose: No Last Day of Therapy:  03/27/2020 Labs - Once weekly:  CBC/D and BMP, Labs - Every other week:  ESR and CRP Method of administration: IV Push Method of administration may be changed at the discretion of home infusion pharmacist based upon assessment of the patient and/or caregiver's ability to self-administer the medication ordered.   furosemide 40 MG tablet Commonly known as: LASIX Take 1 tablet (40 mg total) by mouth daily.   lisinopril 10 MG tablet Commonly known as: ZESTRIL TAKE 1 TABLET(10 MG) BY MOUTH DAILY What changed: See the new instructions.   metoprolol tartrate 25 MG tablet Commonly  known as: LOPRESSOR Take 1 tablet (25 mg total) by mouth 2 (two) times daily.   oxyCODONE 5 MG immediate release tablet Commonly known as: Oxy IR/ROXICODONE Take 1-2 tablets (5-10 mg total) by mouth every 3 (three) hours as needed for severe pain.   potassium chloride SA 20 MEQ tablet Commonly known as: KLOR-CON Take 1 tablet (20 mEq total) by mouth daily.   warfarin 5 MG tablet Commonly known as: COUMADIN Take 1 tablet (5 mg total) by mouth daily at 4 PM.            Discharge Care Instructions  (From admission, onward)         Start     Ordered   03/16/20 0000  Change dressing on IV access line weekly and PRN  (Home infusion instructions - [facility] )     03/16/20 1226         Follow-up Information    Erlene Quan, PA-C. Go on 03/30/2020.   Specialties: Cardiology, Radiology Why: Your appointment is at 9:15am.  Contact information: [place] [id number] [phone]        [facility] [place]. Go on 04/06/2020.   Specialty: Cardiothoracic Surgery Why: Your appointment is at 1pm. Please arrive 30 minutes early for a chest x-ray to be performed by [facility] located on the first floor of the same building.  Contact information: [place], [place], FNP Follow up.   Specialties: Family Medicine, Infectious Diseases Why: 10:30 on 6/18. Please call to reschedule if you are unable to make this appointment.  Contact information: [address] [place] [address] [phone]        [facility] Follow up.   Why: [facility]- arranged for home IV abx needs- HHRN to come from [place] for PICC line care and lab needs.           Signed:  Ellwood Handler, PA-C 03/17/2020, 11:11 AM

## 2020-03-11 NOTE — Hospital Course (Addendum)
[name] was admitted to hospital for elective surgery on 03/06/2020.  He was taken to the operating room where minimally invasive mitral valve replacement was accomplished utilizing a 33 mm Sorin [employer] OptiForm bileaflet mechanical valve.  Following the procedure, he separated from cardiopulmonary bypass without difficulty.  He was extubated in the operating room and was later transferred to the cardiovascular ICU.  He remained hemodynamically stable.  He was initially in a sinus rhythm.  By the second postoperative day, he had developed atrial fibrillation.  He was started on amiodarone infusion.  The atrial fibrillation persisted.  Rate control improved as the amiodarone was loaded.  Low-dose metoprolol was added for enhanced rate control and this was also well-tolerated.  Blood cultures were obtained and morning of the following day were positive for gram-negative rods.  The infectious disease service was consulted.  IV Rocephin was started immediately.  Follow-up echocardiogram was obtained.  UA was unremarkable.  Later in the morning on postop day 5, the patient complained of some irritation at one of his peripheral IV site.  When the IV was removed, the nurse reported seeing purulent matter at the site. CTof right hand c/w thrombophlebitis and no fluid collections.  A consult was obtained with Dr. Merlyn Lot, hand surgeon. US soft tissue right hand-superficial thrombophlebitis of wrist and hand with subcutaneous edema but no abscess. Patient was put in a wrist splint, which helped him a lot. His motor/sensory was intact and as swelling decreased, his movement of fingers improved.  continued to remain afebrile and WBC continued to decrease. As of 06/07, WBC was increasing to 28,000.  Infectious disease was re-consulted.  Due the patient's new valve prosthesis the decision was made to plan to discharge the patient home on IV Cefipime.  A PICC line was placed and home health care orders were placed.  He  remains on coumadin at 5 mg daily.  His most recent INR is ***.  His leukocytosis is recovering with most recent result down to ***.  His incisions are healing w/o evidence of infection.  His right hand phlebitis is much improved.  He is ambulating independently.  He is medically stable for discharge home today.

## 2020-03-11 NOTE — Progress Notes (Signed)
CARDIAC REHAB PHASE I   PRE:  Rate/Rhythm: 94 afib  BP:  Supine: 115/87  Sitting:   Standing:    SaO2: 95%RA  MODE:  Ambulation: 810 ft   POST:  Rate/Rhythm: 118 afib  BP:  Supine:   Sitting: 134/84  Standing:    SaO2: 96%RA 1053-1120 Pt walked 810 ft on RA with steady gait. No assistance needed. Pt stated he had already walked independently.  Tolerated well.   Luetta Nutting, RN BSN  03/11/2020 11:17 AM

## 2020-03-11 NOTE — Consult Note (Signed)
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 male with hx of HTN, rheumatic mitral stenosis who underwent minimally invasive mitral valve replacement with mechanical valve on 5/28. Prior to admit, his cbc showed WBC of 9K which trended up to 25K post op which was anticipated. The WBC trended downward however on 5/29 started to increase back up to 26K. The patient had temp of 102F on evening of 6/2 where blood cx and ua was collected. Blood cx + for GNR, which later identified as enterobacter sp. The patient denies any dysuria, diarrhea, however he does report soreness to his right had for the last 2 days at the site of his PIV site. On exam, the is tenderness on palpation but no obvious erythema. We removed the piv at the bedside, and purulence was expressed/milked from the piv site. (see photo) it is unclear if he brought this up to his care team over the last 2 days.  Past Medical History:  Diagnosis Date  . Hypertension   . Incidental pulmonary nodule 01/08/2020   Multiple in right lung - need f/u CT 1 year due to h/o smoking  . Mild aortic stenosis   . Mitral valve stenosis, severe   . Moderate mitral regurgitation   . Pneumonia    x 1  . Rheumatic mitral stenosis with regurgitation    Echo 08/08/16:  Severe mitral stenosis (mean gradient 17 mmHg).  Moderate mitral regurgitation.  . 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  Wears glasses   . Wears partial dentures    upper    Allergies: No Known Allergies  MEDICATIONS: . acetaminophen  1,000 mg Oral Q6H  . amiodarone  400 mg Oral BID  . amLODipine  10 mg Oral Daily  . aspirin EC  81 mg Oral Daily  . bisacodyl  10 mg Oral Daily   Or  . bisacodyl  10 mg Rectal Daily  . docusate sodium  200 mg Oral Daily  . enoxaparin (LOVENOX) injection  40 mg Subcutaneous QHS  . furosemide  40 mg Oral Daily  . insulin aspart  0-24 Units Subcutaneous TID WC  . mouth rinse  15 mL Mouth Rinse BID  . metoprolol tartrate  12.5 mg Oral BID  . pantoprazole  40 mg Oral Daily  . sodium chloride flush  3 mL Intravenous Q12H  . warfarin  5 mg Oral q1600  . Warfarin - Physician Dosing Inpatient   Does not apply q1600    Social History   Tobacco Use  . Smoking status: Former Smoker    Packs/day: 1.00    Years: 35.00    Pack years: 35.00    Types: Cigarettes    Quit date: 12/09/2019    Years since quitting: 0.2  . Smokeless tobacco: Never Used  Substance Use Topics  . Alcohol use: No  . Drug use: No    Family History  Problem  Relation Age of Onset  . Hypertension Mother   . Hypertension Father   . Stroke Father     Review of Systems  Constitutional: positive for fever, chills, but negative diaphoresis, activity change, appetite change, fatigue and unexpected weight change.  HENT: Negative for congestion, sore throat, rhinorrhea, sneezing, trouble swallowing and sinus pressure.  Eyes: Negative for photophobia and visual disturbance.  Respiratory: Negative for cough, chest tightness, shortness of breath, wheezing and stridor.  Cardiovascular: Negative for chest pain, palpitations and leg swelling.  Gastrointestinal: Negative for nausea, vomiting, abdominal pain, diarrhea, constipation, blood in stool, abdominal distention and anal bleeding.  Genitourinary: Negative for dysuria, hematuria, flank pain and difficulty urinating.  Musculoskeletal: + right hand pain.  Negative for myalgias, back pain, joint swelling, arthralgias and gait problem.  Skin: Negative for color change, pallor, rash and wound.  Neurological: Negative for dizziness, tremors, weakness and light-headedness.  Hematological: Negative for adenopathy. Does not bruise/bleed easily.  Psychiatric/Behavioral: Negative for behavioral problems, confusion, sleep disturbance, dysphoric mood, decreased concentration and agitation.   OBJECTIVE: Temp:  [98.1 F (36.7 C)-102.6 F (39.2 C)] 98.5 F (36.9 C) (06/02 1109) Pulse Rate:  [71-108] 108 (06/02 1106) Resp:  [12-24] 15 (06/02 1109) BP: (108-134)/(68-84) 134/84 (06/02 1109) SpO2:  [92 %-100 %] 100 % (06/02 1109) Weight:  [76.8 kg] 76.8 kg (06/02 0500) Physical Exam  Constitutional: He is oriented to person, place, and time. He appears well-developed and well-nourished. No distress.  HENT:  Mouth/Throat: Oropharynx is clear and moist. No oropharyngeal exudate.  Cardiovascular: Normal rate, regular rhythm and normal heart sounds. +click heard. Pulmonary/Chest: Effort normal and breath sounds normal. No respiratory distress. He has no wheezes.  Abdominal: Soft. Bowel sounds are normal. He exhibits no distension. There is no tenderness.  Lymphadenopathy:  He has no cervical adenopathy.  Neurological: He is alert and oriented to person, place, and time.  Skin: Skin is warm and dry. No rash noted. No erythema.  Psychiatric: He has a normal mood and affect. His behavior is normal.    LABS: Results for orders placed or performed during the hospital encounter of 03/06/20 (from the past 48 hour(s))  Glucose, capillary     Status: Abnormal   Collection Time: 03/09/20  5:06 PM  Result Value Ref Range   Glucose-Capillary 121 (H) 70 - 99 mg/dL    Comment: Glucose reference range applies only to samples taken after fasting for at least 8 hours.  Glucose, capillary     Status: Abnormal   Collection Time: 03/09/20  9:56 PM  Result Value Ref  Range   Glucose-Capillary 130 (H) 70 - 99 mg/dL    Comment: Glucose reference range applies only to samples taken after fasting for at least 8 hours.  Protime-INR     Status: Abnormal   Collection Time: 03/10/20  3:42 AM  Result Value Ref Range   Prothrombin Time 16.3 (H) 11.4 - 15.2 seconds   INR 1.4 (H) 0.8 - 1.2    Comment: (NOTE) INR goal varies based on device and disease states. Performed at [facility], [address], [place], [place] [address]   Basic metabolic panel     Status: Abnormal   Collection Time: 03/10/20  3:42 AM  Result Value Ref Range   Sodium 136 135 - 145 mmol/L   Potassium 3.2 (L) 3.5 - 5.1 mmol/L   Chloride 94 (L) 98 - 111 mmol/L   CO2 32 22 - 32 mmol/L   Glucose, Bld 108 (H)  70 - 99 mg/dL    Comment: Glucose reference range applies only to samples taken after fasting for at least 8 hours.   BUN 14 6 - 20 mg/dL   Creatinine, Ser 8.88 0.61 - 1.24 mg/dL   Calcium 9.3 8.9 - 91.6 mg/dL   GFR calc non Af Amer >60 >60 mL/min   GFR calc Af Amer >60 >60 mL/min   Anion gap 10 5 - 15    Comment: Performed at [facility], [address], [place], [place] [address]  Magnesium     Status: None   Collection Time: 03/10/20  3:42 AM  Result Value Ref Range   Magnesium 2.2 1.7 - 2.4 mg/dL    Comment: Performed at [facility], [address] [place], [place] [address]  Glucose, capillary     Status: Abnormal   Collection Time: 03/10/20  6:28 AM  Result Value Ref Range   Glucose-Capillary 121 (H) 70 - 99 mg/dL    Comment: Glucose reference range applies only to samples taken after fasting for at least 8 hours.  Glucose, capillary     Status: Abnormal   Collection Time: 03/10/20 11:19 AM  Result Value Ref Range   Glucose-Capillary 136 (H) 70 - 99 mg/dL    Comment: Glucose reference range applies only to samples taken after fasting for at least 8 hours.  Glucose, capillary     Status: Abnormal   Collection Time: 03/10/20  4:43 PM  Result Value  Ref Range   Glucose-Capillary 101 (H) 70 - 99 mg/dL    Comment: Glucose reference range applies only to samples taken after fasting for at least 8 hours.  Urinalysis, Routine w reflex microscopic     Status: Abnormal   Collection Time: 03/10/20  6:23 PM  Result Value Ref Range   Color, Urine AMBER (A) YELLOW    Comment: BIOCHEMICALS MAY BE AFFECTED BY COLOR   APPearance CLEAR CLEAR   Specific Gravity, Urine 1.028 1.005 - 1.030   pH 7.0 5.0 - 8.0   Glucose, UA NEGATIVE NEGATIVE mg/dL   Hgb urine dipstick NEGATIVE NEGATIVE   Bilirubin Urine NEGATIVE NEGATIVE   Ketones, ur NEGATIVE NEGATIVE mg/dL   Protein, ur 30 (A) NEGATIVE mg/dL   Nitrite NEGATIVE NEGATIVE   Leukocytes,Ua NEGATIVE NEGATIVE   RBC / HPF 0-5 0 - 5 RBC/hpf   WBC, UA 0-5 0 - 5 WBC/hpf   Bacteria, UA RARE (A) NONE SEEN   Squamous Epithelial / LPF 0-5 0 - 5    Comment: Performed at [facility], [address] [place], [place] [address]  Culture, blood (routine x 2)     Status: None (Preliminary result)   Collection Time: 03/10/20  7:21 PM   Specimen: BLOOD  Result Value Ref Range   Specimen Description BLOOD LEFT ANTECUBITAL    Special Requests      BOTTLES DRAWN AEROBIC AND ANAEROBIC Blood Culture adequate volume   Culture  Setup Time      GRAM NEGATIVE RODS IN BOTH AEROBIC AND ANAEROBIC BOTTLES CRITICAL VALUE NOTED.  VALUE IS CONSISTENT WITH PREVIOUSLY REPORTED AND CALLED VALUE. Performed at [facility], [address], [place], [place] [address]    Culture GRAM NEGATIVE RODS    Report Status PENDING   Culture, blood (routine x 2)     Status: None (Preliminary result)   Collection Time: 03/10/20  7:27 PM   Specimen: BLOOD LEFT WRIST  Result Value Ref Range  Specimen Description BLOOD LEFT WRIST    Special Requests      BOTTLES DRAWN AEROBIC AND ANAEROBIC Blood Culture adequate volume   Culture  Setup Time      GRAM NEGATIVE RODS IN BOTH AEROBIC AND ANAEROBIC BOTTLES Organism ID to  follow CRITICAL RESULT CALLED TO, READ BACK BY AND VERIFIED WITH: Peter MiniumJ. Frens PharmD 11:10 03/11/20 ([email or web address]) Performed at [facility], [address] [place], [place] [address]    Culture GRAM NEGATIVE RODS    Report Status PENDING   Blood Culture ID Panel (Reflexed)     Status: Abnormal   Collection Time: 03/10/20  7:27 PM  Result Value Ref Range   Enterococcus species NOT DETECTED NOT DETECTED   Listeria monocytogenes NOT DETECTED NOT DETECTED   Staphylococcus species NOT DETECTED NOT DETECTED   Staphylococcus aureus (BCID) NOT DETECTED NOT DETECTED   Streptococcus species NOT DETECTED NOT DETECTED   Streptococcus agalactiae NOT DETECTED NOT DETECTED   Streptococcus pneumoniae NOT DETECTED NOT DETECTED   Streptococcus pyogenes NOT DETECTED NOT DETECTED   Acinetobacter baumannii NOT DETECTED NOT DETECTED   Enterobacteriaceae species DETECTED (A) NOT DETECTED    Comment: Enterobacteriaceae represent a large family of gram-negative bacteria, not a single organism. CRITICAL RESULT CALLED TO, READ BACK BY AND VERIFIED WITH: Peter MiniumJ. Frens PharmD 11:10 03/11/20 ([email or web address])    Enterobacter cloacae complex DETECTED (A) NOT DETECTED    Comment: CRITICAL RESULT CALLED TO, READ BACK BY AND VERIFIED WITH: Peter MiniumJ. Frens PharmD 11:10 03/11/20 ([email or web address])    Escherichia coli NOT DETECTED NOT DETECTED   Klebsiella oxytoca NOT DETECTED NOT DETECTED   Klebsiella pneumoniae NOT DETECTED NOT DETECTED   Proteus species NOT DETECTED NOT DETECTED   Serratia marcescens NOT DETECTED NOT DETECTED   Carbapenem resistance NOT DETECTED NOT DETECTED   Haemophilus influenzae NOT DETECTED NOT DETECTED   Neisseria meningitidis NOT DETECTED NOT DETECTED   Pseudomonas aeruginosa NOT DETECTED NOT DETECTED   Candida albicans NOT DETECTED NOT DETECTED   Candida glabrata NOT DETECTED NOT DETECTED   Candida krusei NOT DETECTED NOT DETECTED   Candida parapsilosis NOT DETECTED NOT DETECTED   Candida tropicalis NOT DETECTED NOT  DETECTED    Comment: Performed at [facility], [address] [place], [place] [address]  Glucose, capillary     Status: Abnormal   Collection Time: 03/10/20  9:19 PM  Result Value Ref Range   Glucose-Capillary 134 (H) 70 - 99 mg/dL    Comment: Glucose reference range applies only to samples taken after fasting for at least 8 hours.  Protime-INR     Status: Abnormal   Collection Time: 03/11/20  3:44 AM  Result Value Ref Range   Prothrombin Time 19.7 (H) 11.4 - 15.2 seconds   INR 1.7 (H) 0.8 - 1.2    Comment: (NOTE) INR goal varies based on device and disease states. Performed at [facility], [address] [place], [place] [address]   CBC     Status: Abnormal   Collection Time: 03/11/20  3:44 AM  Result Value Ref Range   WBC 36.1 (H) 4.0 - 10.5 K/uL   RBC 3.69 (L) 4.22 - 5.81 MIL/uL   Hemoglobin 10.5 (L) 13.0 - 17.0 g/dL   HCT 29.532.4 (L) 62.139.0 - 30.852.0 %   MCV 87.8 80.0 - 100.0 fL   MCH 28.5 26.0 - 34.0 pg   MCHC 32.4 30.0 - 36.0 g/dL   RDW 65.712.9 84.611.5 - 96.215.5 %   Platelets  234 150 - 400 K/uL   nRBC 0.0 0.0 - 0.2 %    Comment: Performed at [facility], [address], [place], [place] [address]  Basic metabolic panel     Status: Abnormal   Collection Time: 03/11/20  3:44 AM  Result Value Ref Range   Sodium 134 (L) 135 - 145 mmol/L   Potassium 4.1 3.5 - 5.1 mmol/L   Chloride 95 (L) 98 - 111 mmol/L   CO2 29 22 - 32 mmol/L   Glucose, Bld 116 (H) 70 - 99 mg/dL    Comment: Glucose reference range applies only to samples taken after fasting for at least 8 hours.   BUN 19 6 - 20 mg/dL   Creatinine, Ser 0.62 0.61 - 1.24 mg/dL   Calcium 9.6 8.9 - 37.6 mg/dL   GFR calc non Af Amer >60 >60 mL/min   GFR calc Af Amer >60 >60 mL/min   Anion gap 10 5 - 15    Comment: Performed at [facility], [address], [place], [place] [address]  Glucose, capillary     Status: Abnormal   Collection Time: 03/11/20  6:35 AM  Result Value Ref Range   Glucose-Capillary 107  (H) 70 - 99 mg/dL    Comment: Glucose reference range applies only to samples taken after fasting for at least 8 hours.  Glucose, capillary     Status: None   Collection Time: 03/11/20 11:35 AM  Result Value Ref Range   Glucose-Capillary 89 70 - 99 mg/dL    Comment: Glucose reference range applies only to samples taken after fasting for at least 8 hours.   Comment 1 Notify RN    MICRO: Enterobacter cloacae 6/1 blood cx IMAGING: DG Chest 2 View  Result Date: 03/11/2020 CLINICAL DATA:  Status post cardiac surgery. EXAM: CHEST - 2 VIEW COMPARISON:  Mar 09, 2020. FINDINGS: Stable cardiomediastinal silhouette. Status post cardiac valve repair. No pneumothorax or pleural effusion is noted. Minimal left lingular subsegmental atelectasis or scarring is noted. Minimal right midlung subsegmental atelectasis or scarring is noted. Bony thorax is unremarkable. IMPRESSION: Minimal bilateral midlung subsegmental atelectasis or scarring. Electronically Signed   By: Lupita Raider M.D.   On: 03/11/2020 10:28   Assessment/Plan: 54 yo M with recent MV replacement developed fever on HD #5, found to have enterobacter bacteremia likely for PIV infection  - will plan to treat with cefepime to cover enterobacter.  - await sensitivities on the isolate - recommend to get TTE to see any vegetations - plan to repeat blood cx tomorrow - recommend to get mri of hand to see if there is a deep seated infection that would necessitate I x D.   Duke Salvia Drue Second MD MPH [facility] [phone]

## 2020-03-11 NOTE — Progress Notes (Signed)
Transitions of Care Pharmacist Note  Melvin Burns is a 54 y.o. male s/p mechanical heart valve replacement and will be prescribed Coumadin (warfarin)  at discharge.   Patient Education: I provided the following education on Warfarin to the patient: How to take the medication Described what the medication is Signs of bleeding Signs/symptoms of VTE and stroke  Answered their questions Dietary and medication precautions with warfarin   Discharge Medications Plan: The patient wants to have their discharge medications filled by the Transitions of Care pharmacy rather than their usual pharmacy.  The discharge orders pharmacy has been changed to the Transitions of Care pharmacy, the patient will receive a phone call regarding co-pay, and their medications will be delivered by the Transitions of Care pharmacy.    Thank you,   Fabio Neighbors, PharmD PGY1 Ambulatory Care Resident [facility] # [phone]  March 11, 2020

## 2020-03-11 NOTE — Progress Notes (Signed)
PHARMACY - PHYSICIAN COMMUNICATION CRITICAL VALUE ALERT - BLOOD CULTURE IDENTIFICATION (BCID)  Melvin Burns is an 54 y.o. male who presented to [facility] on 03/06/2020 for minimally invasive MVR, he is now 5 days post-op found to have GN bacteremia. His PIV was bothering him, it was removed and pus was observed, this is likely the source.  Assessment:  4/4 GNR identified as enterobacter cloacae   Name of physician (or Provider) Contacted: Dr. Drue Second  Current antibiotics: ceftriaxone  Changes to prescribed antibiotics recommended:  Recommendations accepted by provider  We will change ceftriaxone to cefepime for the possibility of AmpC production with this organism  Results for orders placed or performed during the hospital encounter of 03/06/20  Blood Culture ID Panel (Reflexed) (Collected: 03/10/2020  7:27 PM)  Result Value Ref Range   Enterococcus species NOT DETECTED NOT DETECTED   Listeria monocytogenes NOT DETECTED NOT DETECTED   Staphylococcus species NOT DETECTED NOT DETECTED   Staphylococcus aureus (BCID) NOT DETECTED NOT DETECTED   Streptococcus species NOT DETECTED NOT DETECTED   Streptococcus agalactiae NOT DETECTED NOT DETECTED   Streptococcus pneumoniae NOT DETECTED NOT DETECTED   Streptococcus pyogenes NOT DETECTED NOT DETECTED   Acinetobacter baumannii NOT DETECTED NOT DETECTED   Enterobacteriaceae species DETECTED (A) NOT DETECTED   Enterobacter cloacae complex DETECTED (A) NOT DETECTED   Escherichia coli NOT DETECTED NOT DETECTED   Klebsiella oxytoca NOT DETECTED NOT DETECTED   Klebsiella pneumoniae NOT DETECTED NOT DETECTED   Proteus species NOT DETECTED NOT DETECTED   Serratia marcescens NOT DETECTED NOT DETECTED   Carbapenem resistance NOT DETECTED NOT DETECTED   Haemophilus influenzae NOT DETECTED NOT DETECTED   Neisseria meningitidis NOT DETECTED NOT DETECTED   Pseudomonas aeruginosa NOT DETECTED NOT DETECTED   Candida albicans NOT DETECTED NOT  DETECTED   Candida glabrata NOT DETECTED NOT DETECTED   Candida krusei NOT DETECTED NOT DETECTED   Candida parapsilosis NOT DETECTED NOT DETECTED   Candida tropicalis NOT DETECTED NOT DETECTED    Jeannetta Nap 03/11/2020  11:12 AM

## 2020-03-11 NOTE — Progress Notes (Signed)
Mobility Specialist: Progress Note    03/11/20 1448  Mobility  Activity Ambulated in hall  Level of Assistance Independent after set-up  Assistive Device None  Distance Ambulated (ft) 1200 ft  Mobility Response Tolerated well  Mobility performed by Mobility specialist  $Mobility charge 1 Mobility   Pre-Mobility: 103 HR, 133/80 BP Post-Mobility: 97 HR, 107/72 BP  Pt tolerated ambulation well. Pt had no c/o of SOB, dizziness, or weakness. Pt walked independently after set-up. Pt was positioned back in bed with call bell and phone within reach.   [facility] Master [place] Mobility Specialist

## 2020-03-11 NOTE — Progress Notes (Addendum)
[address]       [place] [address]             [phone]       5 Days Post-Op Procedure(s) (LRB): MINIMALLY INVASIVE MITRAL VALVE (MV) REPLACEMENT using Carbomedics Optiform 33 MM Mitral Bioprosthetic Valve. (Right) TRANSESOPHAGEAL ECHOCARDIOGRAM (TEE) (N/A) Subjective: Says he feels good, denies pain or cough.  Fever last evening to 102F. Bollod cultures obtained -> both positive for Gm (-) rods.   Objective: Vital signs in last 24 hours: Temp:  [97.7 F (36.5 C)-102.6 F (39.2 C)] 98.4 F (36.9 C) (06/02 0800) Pulse Rate:  [71-87] 71 (06/02 0800) Cardiac Rhythm: Atrial fibrillation (06/02 0826) Resp:  [12-24] 12 (06/02 0800) BP: (105-122)/(67-79) 114/79 (06/02 0800) SpO2:  [92 %-100 %] 97 % (06/02 0800) Weight:  [76.8 kg] 76.8 kg (06/02 0500)  Hemodynamic parameters for last 24 hours:    Intake/Output from previous day: 06/01 0701 - 06/02 0700 In: 1419.4 [P.O.:720; I.V.:699.4] Out: -  Intake/Output this shift: No intake/output data recorded.  General appearance: alert, cooperative and no distress Neurologic: intact Heart: irregularly irregular rhythm Lungs: Breath sounds are clear, slightly diminished right base.  Abdomen: soft and non-tender Extremities: all well perfused, minimal edema. Wound: the right chest incision, chest tube sites and right femoral cannulation sites are intact and healing with no signs of complication.   Lab Results: Recent Labs    03/11/20 0344  WBC 36.1*  HGB 10.5*  HCT 32.4*  PLT 234   BMET:  Recent Labs    03/10/20 0342 03/11/20 0344  NA 136 134*  K 3.2* 4.1  CL 94* 95*  CO2 32 29  GLUCOSE 108* 116*  BUN 14 19  CREATININE 0.92 1.10  CALCIUM 9.3 9.6    PT/INR:  Recent Labs    03/11/20 0344  LABPROT 19.7*  INR 1.7*   ABG    Component Value Date/Time   PHART 7.329 (L) 03/06/2020 1537   HCO3 24.7 03/06/2020 1537   TCO2 26 03/06/2020 1537   ACIDBASEDEF 2.0 03/06/2020 1537   O2SAT  98.0 03/06/2020 1537   CBG (last 3)  Recent Labs    03/10/20 1643 03/10/20 2119 03/11/20 0635  GLUCAP 101* 134* 107*    Assessment/Plan: S/P Procedure(s) (LRB): MINIMALLY INVASIVE MITRAL VALVE (MV) REPLACEMENT using Carbomedics Optiform 33 MM Mitral Bioprosthetic Valve. (Right) TRANSESOPHAGEAL ECHOCARDIOGRAM (TEE) (N/A)  -POD5 Minimally invasive mechanical MV replacement for rheumatic valve disease. INR 1.7, continue Coumadin at 5mg  daily.   -Fever last evening to 160f prompted blood cx's x 2  that are both positive for Gr (-) rods. WBC 36,000. UA negative.  Will consult ID for ABX recommendations, check Echo.     -Post-op atrial fibrillation / flutter-- rate control better with low-dose metoprolol. Converted to oral amiodarone 6/2.  K+4.1 today.   -DVT PPX- continue enoxaparin until INR therapeutic.   -Volume excess- WT still 4kg positiive but appears euvolemic. Continue Lasix 40mg  po daily.    LOS: 5 days    8/2, PA-C [phone] 03/11/2020  Fever last night, blood cultures done- gram negative rods noted this am, left iv hand site tender- question source- was getting IV amiodarone TTE ,strated  on IV antibiotics and seen by ID Ct of hand done results pending  I have seen and examined Melvin Burns and agree with the above assessment  and plan.  [phone] MD Beeper [phone] Office [phone] 03/11/2020 4:47 PM

## 2020-03-11 NOTE — Progress Notes (Signed)
  Echocardiogram 2D Echocardiogram has been performed.  Melvin Burns A Soren Lazarz 03/11/2020, 3:32 PM

## 2020-03-12 ENCOUNTER — Ambulatory Visit: Payer: No Typology Code available for payment source | Admitting: Cardiology

## 2020-03-12 LAB — BASIC METABOLIC PANEL
Anion gap: 11 (ref 5–15)
BUN: 18 mg/dL (ref 6–20)
CO2: 27 mmol/L (ref 22–32)
Calcium: 9.1 mg/dL (ref 8.9–10.3)
Chloride: 96 mmol/L — ABNORMAL LOW (ref 98–111)
Creatinine, Ser: 1.01 mg/dL (ref 0.61–1.24)
GFR calc Af Amer: >60 mL/min (ref >60)
GFR calc non Af Amer: >60 mL/min (ref >60)
Glucose, Bld: 114 mg/dL — ABNORMAL HIGH (ref 70–99)
Potassium: 3.5 mmol/L (ref 3.5–5.1)
Sodium: 134 mmol/L — ABNORMAL LOW (ref 135–145)

## 2020-03-12 LAB — CBC
HCT: 30.9 % — ABNORMAL LOW (ref 39.0–52.0)
Hemoglobin: 10 g/dL — ABNORMAL LOW (ref 13.0–17.0)
MCH: 28 pg (ref 26.0–34.0)
MCHC: 32.4 g/dL (ref 30.0–36.0)
MCV: 86.6 fL (ref 80.0–100.0)
Platelets: 261 10*3/uL (ref 150–400)
RBC: 3.57 MIL/uL — ABNORMAL LOW (ref 4.22–5.81)
RDW: 12.9 % (ref 11.5–15.5)
WBC: 24.2 10*3/uL — ABNORMAL HIGH (ref 4.0–10.5)
nRBC: 0 % (ref 0.0–0.2)

## 2020-03-12 LAB — GLUCOSE, CAPILLARY
Glucose-Capillary: 119 mg/dL — ABNORMAL HIGH (ref 70–99)
Glucose-Capillary: 113 mg/dL — ABNORMAL HIGH (ref 70–99)
Glucose-Capillary: 119 mg/dL — ABNORMAL HIGH (ref 70–99)

## 2020-03-12 LAB — PROTIME-INR
INR: 1.7 — ABNORMAL HIGH (ref 0.8–1.2)
Prothrombin Time: 19.5 seconds — ABNORMAL HIGH (ref 11.4–15.2)

## 2020-03-12 MED ORDER — POTASSIUM CHLORIDE CRYS ER 20 MEQ PO TBCR
30.0000 meq | EXTENDED_RELEASE_TABLET | Freq: Four times a day (QID) | ORAL | Status: AC
  Administered 2020-03-12 (×2): 30 meq via ORAL
  Filled 2020-03-12 (×2): qty 1

## 2020-03-12 MED ORDER — POTASSIUM CHLORIDE CRYS ER 20 MEQ PO TBCR
20.0000 meq | EXTENDED_RELEASE_TABLET | Freq: Every day | ORAL | Status: DC
  Administered 2020-03-13 – 2020-03-18 (×6): 20 meq via ORAL
  Filled 2020-03-12 (×6): qty 1

## 2020-03-12 NOTE — Progress Notes (Signed)
[facility]  Date of Admission:  03/06/2020     Total days of antibiotics 5         ASSESSMENT:  [name] was febrile yesterday and WBC count this morning was decreased. Source control likely obtained with removal of right hand PIV. There was still a small amount of purulent drainage. CT right hand consistent with superficial thrombophlebitis and no evidence of osteomyelitis or abscess. Appears to be superficial infection. Will continue with current dose of cefepime. Awaiting sensitivities of Enterobacter. Rechck blood cultures in the morning. Basic wound care with soap and water of site with dressing change daily. Final recommendations pending sensitivities.   PLAN:  1. Continue current dose of cefepime.  2. Await sensitivities for final recommendations. 3. Basic wound care with soap and water daily. 4. Repeat blood cultures in the morning.  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 amiodarone  400 mg Oral BID  . amLODipine  10 mg Oral Daily  . aspirin EC  81 mg Oral Daily  . bisacodyl  10 mg Oral Daily   Or  . bisacodyl  10 mg Rectal Daily  . docusate sodium  200 mg Oral Daily  . enoxaparin (LOVENOX) injection  40 mg Subcutaneous QHS  . furosemide  40 mg Oral Daily  . insulin aspart  0-24 Units Subcutaneous TID WC  . mouth rinse  15 mL Mouth Rinse BID  . metoprolol tartrate  12.5 mg Oral BID  . pantoprazole  40 mg Oral Daily  . potassium chloride  30 mEq Oral Q6H  . [START ON 03/13/2020] potassium chloride  20 mEq Oral Daily  . sodium chloride flush  3 mL Intravenous Q12H  . warfarin  5 mg Oral q1600  . Warfarin - Physician Dosing Inpatient   Does not apply q1600    SUBJECTIVE:  Febrile yesterday with  temperature of 100.7. WBC count down to 24.2. Right hand feeling better today. Overall feeling okay and denies fevers, chills or sweats.   No Known Allergies   Review of Systems: Review of Systems  Constitutional: Negative for chills, fever and weight loss.  Respiratory: Negative for cough, shortness of breath and wheezing.   Cardiovascular: Negative for chest pain and leg swelling.  Gastrointestinal: Negative for abdominal pain, constipation, diarrhea, nausea and vomiting.  Skin: Negative for rash.      OBJECTIVE: Vitals:   03/12/20 0500 03/12/20 0632 03/12/20 0754 03/12/20 1100  BP:  115/64 121/83 99/75  Pulse:  (!) 105 (!) 104 66  Resp:  20 14 17   Temp:  98.4 F (36.9 C) 99.2 F (37.3 C) 98.9 F (37.2 C)  TempSrc:  Oral Oral Oral  SpO2:  94% 93% 94%  Weight: 76.6 kg     Height:       Body mass index is 24.93 kg/m.  Physical Exam Constitutional:      General: He is not in acute distress.    Appearance: He is well-developed.     Comments: Lying in the bed with head of bed elevated; pleasant.   Cardiovascular:     Rate and Rhythm: Normal rate and regular rhythm.     Heart sounds: Normal heart sounds.  Pulmonary:     Effort: Pulmonary effort is normal.  Breath sounds: Normal breath sounds.  Musculoskeletal:     Comments: Right hand with purulent drainage and mild edema.   Skin:    General: Skin is warm and dry.  Neurological:     Mental Status: He is alert and oriented to person, place, and time.  Psychiatric:        Behavior: Behavior normal.        Thought Content: Thought content normal.        Judgment: Judgment normal.     Lab Results Lab Results  Component Value Date   WBC 24.2 (H) 03/12/2020   HGB 10.0 (L) 03/12/2020   HCT 30.9 (L) 03/12/2020   MCV 86.6 03/12/2020   PLT 261 03/12/2020    Lab Results  Component Value Date   CREATININE 1.01 03/12/2020   BUN 18 03/12/2020   NA 134 (L) 03/12/2020   K 3.5 03/12/2020   CL 96 (L) 03/12/2020     CO2 27 03/12/2020    Lab Results  Component Value Date   ALT 25 03/02/2020   AST 23 03/02/2020   ALKPHOS 65 03/02/2020   BILITOT 0.4 03/02/2020     Microbiology: Recent Results (from the past 240 hour(s))  Culture, blood (routine x 2)     Status: Abnormal (Preliminary result)   Collection Time: 03/10/20  7:21 PM   Specimen: BLOOD  Result Value Ref Range Status   Specimen Description BLOOD LEFT ANTECUBITAL  Final   Special Requests   Final    BOTTLES DRAWN AEROBIC AND ANAEROBIC Blood Culture adequate volume   Culture  Setup Time   Final    GRAM NEGATIVE RODS IN BOTH AEROBIC AND ANAEROBIC BOTTLES CRITICAL VALUE NOTED.  VALUE IS CONSISTENT WITH PREVIOUSLY REPORTED AND CALLED VALUE. Performed at [facility], [place] [address] [place], [place] [address]    Culture ENTEROBACTER CLOACAE (A)  Final   Report Status PENDING  Incomplete  Culture, blood (routine x 2)     Status: Abnormal (Preliminary result)   Collection Time: 03/10/20  7:27 PM   Specimen: BLOOD LEFT WRIST  Result Value Ref Range Status   Specimen Description BLOOD LEFT WRIST  Final   Special Requests   Final    BOTTLES DRAWN AEROBIC AND ANAEROBIC Blood Culture adequate volume   Culture  Setup Time   Final    GRAM NEGATIVE RODS IN BOTH AEROBIC AND ANAEROBIC BOTTLES Organism ID to follow CRITICAL RESULT CALLED TO, READ BACK BY AND VERIFIED WITH: Andres Shad PharmD 11:10 03/11/20 ([email or web address]) Performed at [facility], [address], [place],  [address]    Culture ENTEROBACTER CLOACAE (A)  Final   Report Status PENDING  Incomplete  Blood Culture ID Panel (Reflexed)     Status: Abnormal   Collection Time: 03/10/20  7:27 PM  Result Value Ref Range Status   Enterococcus species NOT DETECTED NOT DETECTED Final   Listeria monocytogenes NOT DETECTED NOT DETECTED Final   Staphylococcus species NOT DETECTED NOT DETECTED Final   Staphylococcus aureus (BCID) NOT DETECTED NOT DETECTED Final   Streptococcus  species NOT DETECTED NOT DETECTED Final   Streptococcus agalactiae NOT DETECTED NOT DETECTED Final   Streptococcus pneumoniae NOT DETECTED NOT DETECTED Final   Streptococcus pyogenes NOT DETECTED NOT DETECTED Final   Acinetobacter baumannii NOT DETECTED NOT DETECTED Final   Enterobacteriaceae species DETECTED (A) NOT DETECTED Final    Comment: Enterobacteriaceae represent a large family of gram-negative bacteria, not a single organism. CRITICAL RESULT CALLED  TO, READ BACK BY AND VERIFIED WITH: Peter Minium PharmD 11:10 03/11/20 ([email or web address])    Enterobacter cloacae complex DETECTED (A) NOT DETECTED Final    Comment: CRITICAL RESULT CALLED TO, READ BACK BY AND VERIFIED WITH: Peter Minium PharmD 11:10 03/11/20 ([email or web address])    Escherichia coli NOT DETECTED NOT DETECTED Final   Klebsiella oxytoca NOT DETECTED NOT DETECTED Final   Klebsiella pneumoniae NOT DETECTED NOT DETECTED Final   Proteus species NOT DETECTED NOT DETECTED Final   Serratia marcescens NOT DETECTED NOT DETECTED Final   Carbapenem resistance NOT DETECTED NOT DETECTED Final   Haemophilus influenzae NOT DETECTED NOT DETECTED Final   Neisseria meningitidis NOT DETECTED NOT DETECTED Final   Pseudomonas aeruginosa NOT DETECTED NOT DETECTED Final   Candida albicans NOT DETECTED NOT DETECTED Final   Candida glabrata NOT DETECTED NOT DETECTED Final   Candida krusei NOT DETECTED NOT DETECTED Final   Candida parapsilosis NOT DETECTED NOT DETECTED Final   Candida tropicalis NOT DETECTED NOT DETECTED Final    Comment: Performed at [facility], [address] [place], [place] [address]     Marcos Eke, NP [facility]  03/12/2020  2:17 PM

## 2020-03-12 NOTE — Progress Notes (Signed)
Patient's heart rate elevated to 130's while this RN was in the room. Scheduled amio and metoprolol given. Patient denies CP/ SOB or any feeling in his chest. Will continue to monitor

## 2020-03-12 NOTE — Progress Notes (Signed)
CARDIAC REHAB PHASE I   PRE:  Rate/Rhythm: 101 afib    BP: sitting 104/81    SaO2: 95 RA  MODE:  Ambulation: 1500 ft   POST:  Rate/Rhythm: 141 afib briefly, mostly controlled 110-120 while walking    BP: sitting 131/89     SaO2: 94 RA  Tolerated very well. No c/o, long distance. 1500 mL IS. Encouraged walking and IS and up ad lib in room today. [phone]   Harriet Masson CES, ACSM 03/12/2020 10:05 AM

## 2020-03-12 NOTE — Progress Notes (Signed)
Mobility Specialist: Progress Note    03/12/20 1332  Mobility  Activity Ambulated in hall  Level of Assistance Independent after set-up  Assistive Device None  Distance Ambulated (ft) 1500 ft  Mobility Response Tolerated well  Mobility performed by Mobility specialist  $Mobility charge 1 Mobility   Pre-Mobility: 85 HR, 113/70 BP, 86% SpO2 During Mobility: 95 HR, 96% SpO2 Post-Mobility: 95 HR, 123/78 BP   Pt tolerated ambulation well. Pt c/o pain in right hand that he rated a 9 on a 1-10 scale. Pt was positioned back in bed with call bell and phone within reach.   [facility] Anastasya Jewell Mobility Specialist

## 2020-03-12 NOTE — Progress Notes (Signed)
Patient's heart rate goes up to 130's (Sinus Tach) but does not sustain. Returns to 90's. Will continue to monitor

## 2020-03-12 NOTE — Progress Notes (Signed)
Notified Dr. Tyrone Sage of patient's temp 101.6. No new orders given. Continue with abx. Will continue to monitor temp

## 2020-03-12 NOTE — Progress Notes (Addendum)
      [address]       [place] [address]             [phone]       6 Days Post-Op   Procedure(s) (LRB): MINIMALLY INVASIVE MITRAL VALVE (MV) REPLACEMENT using [employer]omedics Optiform 33 MM Mitral Bioprosthetic Valve. (Right) TRANSESOPHAGEAL ECHOCARDIOGRAM (TEE) (N/A) Subjective: Says he feels good today, no new concerns, says his left hand is less sore.   Objective: Vital signs in last 24 hours: Temp:  [98.4 F (36.9 C)-100.7 F (38.2 C)] 99.2 F (37.3 C) (06/03 0754) Pulse Rate:  [70-108] 104 (06/03 0754) Cardiac Rhythm: Atrial fibrillation (06/03 0757) Resp:  [14-20] 14 (06/03 0754) BP: (105-134)/(64-98) 121/83 (06/03 0754) SpO2:  [93 %-100 %] 93 % (06/03 0754) Weight:  [76.6 kg] 76.6 kg (06/03 0500)    Intake/Output from previous day: No intake/output data recorded. Intake/Output this shift: No intake/output data recorded.  General appearance: alert, cooperative and no distress Neurologic: intact Heart: irregularly irregular rhythm Lungs: Breath sounds are clear Abdomen: soft and non-tender Extremities: All warm and well perfused, no edema. Dorsum of right hand has minimal tenderness at old IV site.  Wound: The chest incision and right femoral cannulation sites are intact and dry.   Lab Results: Recent Labs    03/11/20 0344 03/12/20 0743  WBC 36.1* 24.2*  HGB 10.5* 10.0*  HCT 32.4* 30.9*  PLT 234 261   BMET:  Recent Labs    03/10/20 0342 03/11/20 0344  NA 136 134*  K 3.2* 4.1  CL 94* 95*  CO2 32 29  GLUCOSE 108* 116*  BUN 14 19  CREATININE 0.92 1.10  CALCIUM 9.3 9.6    PT/INR:  Recent Labs    03/12/20 0234  LABPROT 19.5*  INR 1.7*   ABG    Component Value Date/Time   PHART 7.329 (L) 03/06/2020 1537   HCO3 24.7 03/06/2020 1537   TCO2 26 03/06/2020 1537   ACIDBASEDEF 2.0 03/06/2020 1537   O2SAT 98.0 03/06/2020 1537   CBG (last 3)  Recent Labs    03/11/20 1621 03/11/20 2118 03/12/20 0630  GLUCAP 97 138*  119*    Assessment/Plan: S/P Procedure(s) (LRB): MINIMALLY INVASIVE MITRAL VALVE (MV) REPLACEMENT using Carbomedics Optiform 33 MM Mitral Bioprosthetic Valve. (Right) TRANSESOPHAGEAL ECHOCARDIOGRAM (TEE) (N/A)  -POD6 Minimally invasive mechanical MV replacement for rheumatic valve disease. INR 1.7,  Coumadin  7.5mg  today.   -Fever, Gm negative bacteremia- Enterobacter cultured, on day 2 cefepime per ID recommendations. Tmax 100.7, WBC trending down.  Echo showed normal function of the prosthetic mitral valve, CT of right hand c/w thrombophlebitis and no fluid collections.   Suspect this resulted from infusion of IV amiodarone in a peripheral IV at this site. He has clearly improved since removal of the IV.   Will defer to ID for duration of ABX treatment.   -Post-op atrial fibrillation / flutter-- rate controlled with low-dose metoprolol and oral amiodarone. Converted to oral amiodarone 6/2.  K+ 3.5 Replacement ordered.   -DVT PPX- continue enoxaparin until INR therapeutic.     LOS: 6 days    Leary Roca, [place] [phone] 03/12/2020  inr increasing Reviewed coumadin booklet with him I have seen and examined Melvin Burns and agree with the above assessment  and plan.  Delight Ovens MD [place] [phone] Office [phone] 03/12/2020 12:49 PM

## 2020-03-13 ENCOUNTER — Inpatient Hospital Stay (HOSPITAL_COMMUNITY): Payer: No Typology Code available for payment source

## 2020-03-13 DIAGNOSIS — B9689 Other specified bacterial agents as the cause of diseases classified elsewhere: Secondary | ICD-10-CM

## 2020-03-13 DIAGNOSIS — R7881 Bacteremia: Secondary | ICD-10-CM

## 2020-03-13 DIAGNOSIS — T827XXA Infection and inflammatory reaction due to other cardiac and vascular devices, implants and grafts, initial encounter: Secondary | ICD-10-CM

## 2020-03-13 LAB — BASIC METABOLIC PANEL
Anion gap: 11 (ref 5–15)
BUN: 13 mg/dL (ref 6–20)
CO2: 24 mmol/L (ref 22–32)
Calcium: 9.1 mg/dL (ref 8.9–10.3)
Chloride: 97 mmol/L — ABNORMAL LOW (ref 98–111)
Creatinine, Ser: 0.92 mg/dL (ref 0.61–1.24)
GFR calc Af Amer: >60 mL/min (ref >60)
GFR calc non Af Amer: >60 mL/min (ref >60)
Glucose, Bld: 123 mg/dL — ABNORMAL HIGH (ref 70–99)
Potassium: 3.8 mmol/L (ref 3.5–5.1)
Sodium: 132 mmol/L — ABNORMAL LOW (ref 135–145)

## 2020-03-13 LAB — CBC
HCT: 29.7 % — ABNORMAL LOW (ref 39.0–52.0)
Hemoglobin: 9.6 g/dL — ABNORMAL LOW (ref 13.0–17.0)
MCH: 28 pg (ref 26.0–34.0)
MCHC: 32.3 g/dL (ref 30.0–36.0)
MCV: 86.6 fL (ref 80.0–100.0)
Platelets: 278 10*3/uL (ref 150–400)
RBC: 3.43 MIL/uL — ABNORMAL LOW (ref 4.22–5.81)
RDW: 13 % (ref 11.5–15.5)
WBC: 23 10*3/uL — ABNORMAL HIGH (ref 4.0–10.5)
nRBC: 0.1 % (ref 0.0–0.2)

## 2020-03-13 LAB — PROTIME-INR
INR: 1.7 — ABNORMAL HIGH (ref 0.8–1.2)
Prothrombin Time: 19.6 seconds — ABNORMAL HIGH (ref 11.4–15.2)

## 2020-03-13 LAB — CULTURE, BLOOD (ROUTINE X 2)
Special Requests: ADEQUATE
Special Requests: ADEQUATE

## 2020-03-13 LAB — GLUCOSE, CAPILLARY
Glucose-Capillary: 140 mg/dL — ABNORMAL HIGH (ref 70–99)
Glucose-Capillary: 109 mg/dL — ABNORMAL HIGH (ref 70–99)
Glucose-Capillary: 174 mg/dL — ABNORMAL HIGH (ref 70–99)
Glucose-Capillary: 104 mg/dL — ABNORMAL HIGH (ref 70–99)
Glucose-Capillary: 121 mg/dL — ABNORMAL HIGH (ref 70–99)

## 2020-03-13 MED ORDER — WARFARIN SODIUM 7.5 MG PO TABS
7.5000 mg | ORAL_TABLET | Freq: Every day | ORAL | Status: DC
  Administered 2020-03-13: 7.5 mg via ORAL
  Filled 2020-03-13: qty 1

## 2020-03-13 MED ORDER — METOPROLOL TARTRATE 25 MG PO TABS
25.0000 mg | ORAL_TABLET | Freq: Two times a day (BID) | ORAL | Status: DC
  Administered 2020-03-13 – 2020-03-18 (×11): 25 mg via ORAL
  Filled 2020-03-13 (×11): qty 1

## 2020-03-13 MED FILL — Mannitol IV Soln 20%: INTRAVENOUS | Qty: 500 | Status: AC

## 2020-03-13 MED FILL — Lidocaine HCl Local Preservative Free (PF) Inj 2%: INTRAMUSCULAR | Qty: 15 | Status: AC

## 2020-03-13 MED FILL — Sodium Bicarbonate IV Soln 8.4%: INTRAVENOUS | Qty: 50 | Status: AC

## 2020-03-13 MED FILL — Heparin Sodium (Porcine) Inj 1000 Unit/ML: INTRAMUSCULAR | Qty: 10 | Status: AC

## 2020-03-13 MED FILL — Potassium Chloride Inj 2 mEq/ML: INTRAVENOUS | Qty: 40 | Status: AC

## 2020-03-13 MED FILL — Sodium Chloride IV Soln 0.9%: INTRAVENOUS | Qty: 2000 | Status: AC

## 2020-03-13 MED FILL — Heparin Sodium (Porcine) Inj 1000 Unit/ML: INTRAMUSCULAR | Qty: 30 | Status: AC

## 2020-03-13 MED FILL — Electrolyte-R (PH 7.4) Solution: INTRAVENOUS | Qty: 4000 | Status: AC

## 2020-03-13 MED FILL — Albumin, Human Inj 5%: INTRAVENOUS | Qty: 250 | Status: AC

## 2020-03-13 NOTE — Plan of Care (Signed)
  Problem: Health Behavior/Discharge Planning: Goal: Ability to manage health-related needs will improve Outcome: Progressing   

## 2020-03-13 NOTE — Progress Notes (Signed)
Mobility Specialist - Progress Note   03/13/20 1357  Mobility  Activity Ambulated in hall  Level of Assistance Independent  Assistive Device None  Distance Ambulated (ft) 1000 ft  Mobility Response Tolerated well  Mobility performed by Mobility specialist  $Mobility charge 1 Mobility   Pre-mobility: 95 HR Post-mobility:121 HR  Dejah Droessler Technical sales engineer

## 2020-03-13 NOTE — Progress Notes (Signed)
[facility]  Date of Admission:  03/06/2020     Total days of antibiotics 6         ASSESSMENT:  Melvin Burns was febrile overnight. Right had remains with mild/moderate edema and without drainage. Orthopedics evaluated with no current surgical recommendations for thrombophlebitis. Repeat blood cultures were drawn this morning and are pending. Enterobacter resistant to Cefazolin otherwise pan-sensitive. Will continue current dose of cefepime.   PLAN:  1. Continue current dose of cefepime. 2. Monitor repeat cultures for clearance of bacteremia.  3. Continue basic wound care.   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 amiodarone  400 mg Oral BID  . amLODipine  10 mg Oral Daily  . aspirin EC  81 mg Oral Daily  . bisacodyl  10 mg Oral Daily   Or  . bisacodyl  10 mg Rectal Daily  . docusate sodium  200 mg Oral Daily  . enoxaparin (LOVENOX) injection  40 mg Subcutaneous QHS  . furosemide  40 mg Oral Daily  . insulin aspart  0-24 Units Subcutaneous TID WC  . mouth rinse  15 mL Mouth Rinse BID  . metoprolol tartrate  25 mg Oral BID  . pantoprazole  40 mg Oral Daily  . potassium chloride  20 mEq Oral Daily  . sodium chloride flush  3 mL Intravenous Q12H  . warfarin  7.5 mg Oral q1600  . Warfarin - Physician Dosing Inpatient   Does not apply q1600    SUBJECTIVE:  Febrile overnight with max temperature of 101.6. WBC count stable. Hand is feeling okay. Denies feelings of fever, chills or sweats.   No Known Allergies   Review of Systems: Review of Systems  Constitutional: Negative for chills, fever and weight loss.  Respiratory: Negative for cough,  shortness of breath and wheezing.   Cardiovascular: Negative for chest pain and leg swelling.  Gastrointestinal: Negative for abdominal pain, constipation, diarrhea, nausea and vomiting.  Skin: Negative for rash.      OBJECTIVE: Vitals:   03/12/20 2340 03/13/20 0300 03/13/20 0641 03/13/20 0900  BP: 132/84 128/86  101/66  Pulse: (!) 102 78  90  Resp: 17 18  15   Temp: 99.8 F (37.7 C) 100.2 F (37.9 C) 98.4 F (36.9 C) 98.5 F (36.9 C)  TempSrc: Oral Oral Oral Oral  SpO2: 96% 95%  100%  Weight:  76.6 kg    Height:       Body mass index is 24.94 kg/m.  Physical Exam Constitutional:      General: He is not in acute distress.    Appearance: He is well-developed.  Cardiovascular:     Rate and Rhythm: Normal rate and regular rhythm.     Heart sounds: Normal heart sounds.  Pulmonary:     Effort: Pulmonary effort is normal.     Breath sounds: Normal breath sounds.  Musculoskeletal:     Comments: Right hand with mild/moderate edema. About dime sized area around previous IV site that is elevated and slightly indurated.   Skin:    General: Skin is warm and dry.  Neurological:     Mental Status: He is alert and oriented to person, place, and time.  Psychiatric:        Behavior: Behavior normal.        Thought Content: Thought content normal.        Judgment: Judgment normal.     Lab Results Lab Results  Component Value Date   WBC 23.0 (H) 03/13/2020   HGB 9.6 (L) 03/13/2020   HCT 29.7 (L) 03/13/2020   MCV 86.6 03/13/2020   PLT 278 03/13/2020    Lab Results  Component Value Date   CREATININE 0.92 03/13/2020   BUN 13 03/13/2020   NA 132 (L) 03/13/2020   K 3.8 03/13/2020   CL 97 (L) 03/13/2020   CO2 24 03/13/2020    Lab Results  Component Value Date   ALT 25 03/02/2020   AST 23 03/02/2020   ALKPHOS 65 03/02/2020   BILITOT 0.4 03/02/2020     Microbiology: Recent Results (from the past 240 hour(s))  Culture, blood (routine x 2)     Status: Abnormal    Collection Time: 03/10/20  7:21 PM   Specimen: BLOOD  Result Value Ref Range Status   Specimen Description BLOOD LEFT ANTECUBITAL  Final   Special Requests   Final    BOTTLES DRAWN AEROBIC AND ANAEROBIC Blood Culture adequate volume   Culture  Setup Time   Final    GRAM NEGATIVE RODS IN BOTH AEROBIC AND ANAEROBIC BOTTLES CRITICAL VALUE NOTED.  VALUE IS CONSISTENT WITH PREVIOUSLY REPORTED AND CALLED VALUE.    Culture (A)  Final    ENTEROBACTER CLOACAE SUSCEPTIBILITIES PERFORMED ON PREVIOUS CULTURE WITHIN THE LAST 5 DAYS. Performed at [facility], [place] [address] [place], [place] [address]    Report Status 03/13/2020 FINAL  Final  Culture, blood (routine x 2)     Status: Abnormal   Collection Time: 03/10/20  7:27 PM   Specimen: BLOOD LEFT WRIST  Result Value Ref Range Status   Specimen Description BLOOD LEFT WRIST  Final   Special Requests   Final    BOTTLES DRAWN AEROBIC AND ANAEROBIC Blood Culture adequate volume   Culture  Setup Time   Final    GRAM NEGATIVE RODS IN BOTH AEROBIC AND ANAEROBIC BOTTLES CRITICAL RESULT CALLED TO, READ BACK BY AND VERIFIED WITH: Andres Shad PharmD 11:10 03/11/20 ([email or web address]) Performed at [facility], [place] [address], [place], [place] [address]    Culture ENTEROBACTER CLOACAE (A)  Final   Report Status 03/13/2020 FINAL  Final   Organism ID, Bacteria ENTEROBACTER CLOACAE  Final      Susceptibility   Enterobacter cloacae - MIC*    CEFAZOLIN >=64 RESISTANT Resistant     CEFEPIME <=1 SENSITIVE Sensitive     CEFTAZIDIME <=1 SENSITIVE Sensitive     CEFTRIAXONE <=1 SENSITIVE Sensitive     CIPROFLOXACIN <=0.25 SENSITIVE Sensitive     GENTAMICIN <=1 SENSITIVE Sensitive     IMIPENEM <=0.25 SENSITIVE Sensitive     TRIMETH/SULFA <=20 SENSITIVE Sensitive     PIP/TAZO <=4 SENSITIVE Sensitive     * ENTEROBACTER CLOACAE  Blood Culture ID Panel (Reflexed)     Status: Abnormal   Collection Time: 03/10/20  7:27 PM  Result Value Ref Range Status    Enterococcus species NOT DETECTED NOT DETECTED Final   Listeria monocytogenes NOT DETECTED NOT DETECTED Final   Staphylococcus species NOT DETECTED NOT DETECTED Final   Staphylococcus aureus (BCID) NOT DETECTED NOT DETECTED Final   Streptococcus species NOT DETECTED NOT DETECTED Final   Streptococcus agalactiae NOT DETECTED NOT DETECTED Final  Streptococcus pneumoniae NOT DETECTED NOT DETECTED Final   Streptococcus pyogenes NOT DETECTED NOT DETECTED Final   Acinetobacter baumannii NOT DETECTED NOT DETECTED Final   Enterobacteriaceae species DETECTED (A) NOT DETECTED Final    Comment: Enterobacteriaceae represent a large family of gram-negative bacteria, not a single organism. CRITICAL RESULT CALLED TO, READ BACK BY AND VERIFIED WITH: Peter Minium PharmD 11:10 03/11/20 ([email or web address])    Enterobacter cloacae complex DETECTED (A) NOT DETECTED Final    Comment: CRITICAL RESULT CALLED TO, READ BACK BY AND VERIFIED WITH: Peter Minium PharmD 11:10 03/11/20 ([email or web address])    Escherichia coli NOT DETECTED NOT DETECTED Final   Klebsiella oxytoca NOT DETECTED NOT DETECTED Final   Klebsiella pneumoniae NOT DETECTED NOT DETECTED Final   Proteus species NOT DETECTED NOT DETECTED Final   Serratia marcescens NOT DETECTED NOT DETECTED Final   Carbapenem resistance NOT DETECTED NOT DETECTED Final   Haemophilus influenzae NOT DETECTED NOT DETECTED Final   Neisseria meningitidis NOT DETECTED NOT DETECTED Final   Pseudomonas aeruginosa NOT DETECTED NOT DETECTED Final   Candida albicans NOT DETECTED NOT DETECTED Final   Candida glabrata NOT DETECTED NOT DETECTED Final   Candida krusei NOT DETECTED NOT DETECTED Final   Candida parapsilosis NOT DETECTED NOT DETECTED Final   Candida tropicalis NOT DETECTED NOT DETECTED Final    Comment: Performed at [facility], [address] [place], [place] [address]     Marcos Eke, NP [facility]  03/13/2020  12:35 PM

## 2020-03-13 NOTE — Plan of Care (Signed)
  Problem: Clinical Measurements: Goal: Diagnostic test results will improve Outcome: Progressing   

## 2020-03-13 NOTE — Progress Notes (Signed)
CARDIAC REHAB PHASE I   PRE:  Rate/Rhythm: 88 afib    BP: sitting 113/91    SaO2:   MODE:  Ambulation: 1800 ft   POST:  Rate/Rhythm: 106 afib    BP: sitting 105/70     SaO2:   Tolerated well, no c/o. HR more controlled today. Discussed restrictions, IS, exercise, coumadin and vitamin K, and CRPII. Pt voiced understanding but would benefit from more discussion on Coumadin (pharmacy c/s). Will refer to [place] CRPII for virtual and/or inperson. Pt is interested in participating in Virtual Cardiac and Pulmonary Rehab. Pt advised that Virtual Cardiac and Pulmonary Rehab is provided at no cost to the patient.  Checklist:  1. Pt has smart device  ie smartphone and/or ipad for downloading an app  Yes 2. Reliable internet/wifi service    Yes 3. Understands how to use their smartphone and navigate within an app.  Yes Pt verbalized understanding and is in agreement.  [id number]  Harriet Masson CES, ACSM 03/13/2020 12:02 PM

## 2020-03-13 NOTE — Progress Notes (Signed)
Orthopedic Tech Progress Note Patient Details:  Melvin Burns 04-02-1966 [id number]  Ortho Devices Type of Ortho Device: Velcro wrist splint Ortho Device/Splint Location: Right Upper Extremity Ortho Device/Splint Interventions: Ordered, Application, Adjustment   Post Interventions Patient Tolerated: Well Instructions Provided: Adjustment of device, Care of device, Poper ambulation with device   [name] Clarene Reamer 03/13/2020, 4:05 PM

## 2020-03-13 NOTE — Consult Note (Addendum)
Reason for Consult:Right hand cellulitis Referring Physician: Jake Seats M. Melvin Burns is an 54 y.o. male.  HPI: Melvin Burns was admitted a week ago for mitral valve replacement which went well. On 6/2 he spiked a fever and also noted his right hand hurt. He was noted to have thrombophlebitis from an IV, which was removed and he was started on abx. Blood cultures were positive. A CT done that day did not show any abscess or deep involvement. Since then his WBC has improved but he spiked a temperature again last night and hand surgery was consulted. He notes that his hand pain and swelling have continued to improve daily. 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  Past Medical History:  Diagnosis Date   Hypertension    Incidental pulmonary nodule 01/08/2020   Multiple in right lung - need f/u CT 1 year due to h/o smoking   Mild aortic stenosis    Mitral valve stenosis, severe    Moderate mitral regurgitation    Pneumonia    x 1   Rheumatic mitral stenosis with regurgitation    Echo 08/08/16:  Severe mitral stenosis (mean gradient 17 mmHg).  Moderate mitral regurgitation.   S/P minimally-invasive mitral valve replacement with mechanical valve 03/06/2020   33 mm Sorin Carbomedics Optiform bileaflet mechanical valve via right mini thoracotomy approach   Wears glasses    Wears partial dentures    upper    Past Surgical History:  Procedure Laterality Date   BILATERAL KNEE ARTHROSCOPY     BUBBLE STUDY  12/16/2019   Procedure: BUBBLE STUDY;  Surgeon: [name], MD;  Location: [facility] ENDOSCOPY;  Service: Cardiology;;   MITRAL VALVE REPLACEMENT Right 03/06/2020   Procedure: MINIMALLY INVASIVE MITRAL VALVE (MV) REPLACEMENT using Carbomedics Optiform 33 MM Mitral Bioprosthetic Valve.;  Surgeon: Purcell Nails, MD;  Location: [facility] OR;  Service: Open Heart Surgery;  Laterality: Right;   MULTIPLE EXTRACTIONS WITH ALVEOLOPLASTY N/A 12/31/2019   Procedure: Extraction of tooth #'s 16-17, 20-23,  and 28-30 with alveoloplasty, Bilateral mandibular tori reductions, and lateral exostoses reductions.;  Surgeon: Charlynne Pander, DDS;  Location: [facility] OR;  Service: Oral Surgery;  Laterality: N/A;   RIGHT/LEFT HEART CATH AND CORONARY ANGIOGRAPHY N/A 12/16/2019   Procedure: RIGHT/LEFT HEART CATH AND CORONARY ANGIOGRAPHY;  Surgeon: Yvonne Kendall, MD;  Location: [facility] INVASIVE CV LAB;  Service: Cardiovascular;  Laterality: N/A;   TEE WITHOUT CARDIOVERSION N/A 12/16/2019   Procedure: TRANSESOPHAGEAL ECHOCARDIOGRAM (TEE);  Surgeon: [name], MD;  Location: [facility] ENDOSCOPY;  Service: Cardiology;  Laterality: N/A;   TEE WITHOUT CARDIOVERSION N/A 03/06/2020   Procedure: TRANSESOPHAGEAL ECHOCARDIOGRAM (TEE);  Surgeon: Purcell Nails, MD;  Location: [facility] OR;  Service: Open Heart Surgery;  Laterality: N/A;    Family History  Problem Relation Age of Onset   Hypertension Mother    Hypertension Father    Stroke Father     Social History:  reports that he quit smoking about 3 months ago. His smoking use included cigarettes. He has a 35.00 pack-year smoking history. He has never used smokeless tobacco. He reports that he does not drink alcohol or use drugs.  Allergies: No Known Allergies  Medications: I have reviewed the patient's current medications.  Results for orders placed or performed during the hospital encounter of 03/06/20 (from the past 48 hour(s))  Glucose, capillary     Status: None   Collection Time: 03/11/20 11:35 AM  Result Value Ref Range   Glucose-Capillary 89 70 -  99 mg/dL    Comment: Glucose reference range applies only to samples taken after fasting for at least 8 hours.   Comment 1 Notify RN   Glucose, capillary     Status: None   Collection Time: 03/11/20  4:21 PM  Result Value Ref Range   Glucose-Capillary 97 70 - 99 mg/dL    Comment: Glucose reference range applies only to samples taken after fasting for at least 8 hours.  Glucose, capillary     Status: Abnormal    Collection Time: 03/11/20  9:18 PM  Result Value Ref Range   Glucose-Capillary 138 (H) 70 - 99 mg/dL    Comment: Glucose reference range applies only to samples taken after fasting for at least 8 hours.  Protime-INR     Status: Abnormal   Collection Time: 03/12/20  2:34 AM  Result Value Ref Range   Prothrombin Time 19.5 (H) 11.4 - 15.2 seconds   INR 1.7 (H) 0.8 - 1.2    Comment: (NOTE) INR goal varies based on device and disease states. Performed at [facility], [address] [place], [place] [address]   Glucose, capillary     Status: Abnormal   Collection Time: 03/12/20  6:30 AM  Result Value Ref Range   Glucose-Capillary 119 (H) 70 - 99 mg/dL    Comment: Glucose reference range applies only to samples taken after fasting for at least 8 hours.  CBC     Status: Abnormal   Collection Time: 03/12/20  7:43 AM  Result Value Ref Range   WBC 24.2 (H) 4.0 - 10.5 K/uL   RBC 3.57 (L) 4.22 - 5.81 MIL/uL   Hemoglobin 10.0 (L) 13.0 - 17.0 g/dL   HCT 60.430.9 (L) 54.039.0 - 98.152.0 %   MCV 86.6 80.0 - 100.0 fL   MCH 28.0 26.0 - 34.0 pg   MCHC 32.4 30.0 - 36.0 g/dL   RDW 19.112.9 47.811.5 - 29.515.5 %   Platelets 261 150 - 400 K/uL   nRBC 0.0 0.0 - 0.2 %    Comment: Performed at [facility], [address] [place], [place] [address]  Basic metabolic panel     Status: Abnormal   Collection Time: 03/12/20  7:43 AM  Result Value Ref Range   Sodium 134 (L) 135 - 145 mmol/L   Potassium 3.5 3.5 - 5.1 mmol/L   Chloride 96 (L) 98 - 111 mmol/L   CO2 27 22 - 32 mmol/L   Glucose, Bld 114 (H) 70 - 99 mg/dL    Comment: Glucose reference range applies only to samples taken after fasting for at least 8 hours.   BUN 18 6 - 20 mg/dL   Creatinine, Ser 8.651.01 0.61 - 1.24 mg/dL   Calcium 9.1 8.9 - 78.410.3 mg/dL   GFR calc non Af Amer >60 >60 mL/min   GFR calc Af Amer >60 >60 mL/min   Anion gap 11 5 - 15    Comment: Performed at [facility], [address] [place], [place] [address]  Glucose, capillary      Status: Abnormal   Collection Time: 03/12/20 11:25 AM  Result Value Ref Range   Glucose-Capillary 113 (H) 70 - 99 mg/dL    Comment: Glucose reference range applies only to samples taken after fasting for at least 8 hours.  Glucose, capillary     Status: Abnormal   Collection Time: 03/12/20  4:14 PM  Result Value Ref Range   Glucose-Capillary 119 (H) 70 -  99 mg/dL    Comment: Glucose reference range applies only to samples taken after fasting for at least 8 hours.  Glucose, capillary     Status: Abnormal   Collection Time: 03/12/20  9:35 PM  Result Value Ref Range   Glucose-Capillary 140 (H) 70 - 99 mg/dL    Comment: Glucose reference range applies only to samples taken after fasting for at least 8 hours.  Protime-INR     Status: Abnormal   Collection Time: 03/13/20  4:37 AM  Result Value Ref Range   Prothrombin Time 19.6 (H) 11.4 - 15.2 seconds   INR 1.7 (H) 0.8 - 1.2    Comment: (NOTE) INR goal varies based on device and disease states. Performed at [facility], [place] [address], [place], [place] [address]   CBC     Status: Abnormal   Collection Time: 03/13/20  4:37 AM  Result Value Ref Range   WBC 23.0 (H) 4.0 - 10.5 K/uL   RBC 3.43 (L) 4.22 - 5.81 MIL/uL   Hemoglobin 9.6 (L) 13.0 - 17.0 g/dL   HCT 29.7 (L) 39.0 - 52.0 %   MCV 86.6 80.0 - 100.0 fL   MCH 28.0 26.0 - 34.0 pg   MCHC 32.3 30.0 - 36.0 g/dL   RDW 13.0 11.5 - 15.5 %   Platelets 278 150 - 400 K/uL   nRBC 0.1 0.0 - 0.2 %    Comment: Performed at [facility], [place] [address] [place], [place] [address]  Basic metabolic panel     Status: Abnormal   Collection Time: 03/13/20  4:37 AM  Result Value Ref Range   Sodium 132 (L) 135 - 145 mmol/L   Potassium 3.8 3.5 - 5.1 mmol/L   Chloride 97 (L) 98 - 111 mmol/L   CO2 24 22 - 32 mmol/L   Glucose, Bld 123 (H) 70 - 99 mg/dL    Comment: Glucose reference range applies only to samples taken after fasting for at least 8 hours.   BUN 13 6 - 20 mg/dL    Creatinine, Ser 0.92 0.61 - 1.24 mg/dL   Calcium 9.1 8.9 - 10.3 mg/dL   GFR calc non Af Amer >60 >60 mL/min   GFR calc Af Amer >60 >60 mL/min   Anion gap 11 5 - 15    Comment: Performed at [place] [address], [place], [place] [address]  Glucose, capillary     Status: Abnormal   Collection Time: 03/13/20  6:23 AM  Result Value Ref Range   Glucose-Capillary 109 (H) 70 - 99 mg/dL    Comment: Glucose reference range applies only to samples taken after fasting for at least 8 hours.    CT HAND RIGHT W CONTRAST  Result Date: 03/11/2020 CLINICAL DATA:  Patient with a right hand infection. Elevated white blood cell count. EXAM: CT OF THE UPPER RIGHT EXTREMITY WITH CONTRAST TECHNIQUE: Multidetector CT imaging of the upper right extremity was performed according to the standard protocol following intravenous contrast administration. COMPARISON:  None. CONTRAST:  100 mL OMNIPAQUE IOHEXOL 300 MG/ML  SOLN FINDINGS: Bones/Joint/Cartilage No bony destructive change or periosteal reaction is identified. No fracture or dislocation. No joint effusion. Ligaments Suboptimally assessed by CT. Muscles and Tendons Intact. No intramuscular fluid collection. No gas within muscle or tracking along fascial planes. Soft tissues Infiltration of subcutaneous fat is seen in the dorsum of the wrist hand. A superficial subcutaneous vein in the dorsum of the distal forearm  through the level of the proximal metacarpals does not opacify with contrast and has abnormal enhancement of its wall. There are 2 small foci of gas within the vein. No rim enhancing fluid collection is identified. IMPRESSION: Findings consistent with superficial thrombophlebitis of the right hand as described above. Cellulitis without abscess. Negative for CT evidence of osteomyelitis, myositis or septic joint. Electronically Signed   By: Drusilla Kanner M.D.   On: 03/11/2020 18:04   ECHOCARDIOGRAM COMPLETE  Result Date: 03/11/2020     ECHOCARDIOGRAM REPORT   Patient Name:   Melvin Burns Date of Exam: 03/11/2020 Medical Rec #:  [id number]           Height:       69.0 in Accession #:    [id number]          Weight:       169.3 lb Date of Birth:  10-07-1966          BSA:          1.925 m Patient Age:    53 years            BP:           134/84 mmHg Patient Gender: M                   HR:           100 bpm. Exam Location:  Inpatient Procedure: 2D Echo Indications:    S/P Mitral Valve replacement Z95.2  History:        Patient has prior history of Echocardiogram examinations, most                 recent 03/06/2020. Risk Factors:Smoker and Hypertension.  Sonographer:    Leeroy Bock Turrentine Referring Phys: [id number] MYRON G RODDENBERRY IMPRESSIONS  1. Left ventricular ejection fraction, by estimation, is 60 to 65%. The left ventricle has normal function. The left ventricle has no regional wall motion abnormalities. There is mild left ventricular hypertrophy. Left ventricular diastolic parameters are indeterminate.  2. Left atrial size was moderately dilated.  3. There is a bioprosthetic mitral valve. No significant mitral regurgitation noted. Mean gradient 8 mmHg but short pressure halftime, doubt significant bioprosthetic valve stenosis.  4. The aortic valve is tricuspid. Aortic valve regurgitation is trivial. Mild aortic valve stenosis. Aortic valve mean gradient measures 11.5 mmHg.  5. Right ventricular systolic function is normal. The right ventricular size is normal. Tricuspid regurgitation signal is inadequate for assessing PA pressure.  6. The inferior vena cava is normal in size with greater than 50% respiratory variability, suggesting right atrial pressure of 3 mmHg.  7. The patient was in atrial flutter. FINDINGS  Left Ventricle: Left ventricular ejection fraction, by estimation, is 60 to 65%. The left ventricle has normal function. The left ventricle has no regional wall motion abnormalities. The left ventricular internal cavity size was normal  in size. There is  mild left ventricular hypertrophy. Left ventricular diastolic parameters are indeterminate. Right Ventricle: The right ventricular size is normal. No increase in right ventricular wall thickness. Right ventricular systolic function is normal. Tricuspid regurgitation signal is inadequate for assessing PA pressure. Left Atrium: Left atrial size was moderately dilated. Right Atrium: Right atrial size was normal in size. Pericardium: There is no evidence of pericardial effusion. Mitral Valve: There is a bioprosthetic mitral valve. No significant mitral regurgitation noted. Mean gradient 8 mmHg but short pressure halftime, doubt significant bioprosthetic valve stenosis. The mitral valve has been  repaired/replaced. No evidence of mitral valve regurgitation. MV peak gradient, 23.2 mmHg. The mean mitral valve gradient is 8.0 mmHg. Tricuspid Valve: The tricuspid valve is normal in structure. Tricuspid valve regurgitation is not demonstrated. Aortic Valve: The aortic valve is tricuspid. Aortic valve regurgitation is trivial. Mild aortic stenosis is present. Aortic valve mean gradient measures 11.5 mmHg. Aortic valve peak gradient measures 22.9 mmHg. Aortic valve area, by VTI measures 1.10 cm. Pulmonic Valve: The pulmonic valve was normal in structure. Pulmonic valve regurgitation is not visualized. Aorta: The aortic root is normal in size and structure. Venous: The inferior vena cava is normal in size with greater than 50% respiratory variability, suggesting right atrial pressure of 3 mmHg. IAS/Shunts: No atrial level shunt detected by color flow Doppler.  LEFT VENTRICLE PLAX 2D LVIDd:         4.00 cm LVIDs:         2.80 cm LV PW:         1.20 cm LV IVS:        1.20 cm LVOT diam:     1.70 cm LV SV:         43 LV SV Index:   22 LVOT Area:     2.27 cm  LEFT ATRIUM             Index       RIGHT ATRIUM           Index LA diam:        5.00 cm 2.60 cm/m  RA Area:     16.20 cm LA Vol (A2C):   82.3 ml 42.76  ml/m RA Volume:   42.10 ml  21.87 ml/m LA Vol (A4C):   87.0 ml 45.20 ml/m LA Biplane Vol: 86.2 ml 44.78 ml/m  AORTIC VALVE AV Area (Vmax):    1.11 cm AV Area (Vmean):   1.18 cm AV Area (VTI):     1.10 cm AV Vmax:           239.50 cm/s AV Vmean:          155.000 cm/s AV VTI:            0.392 m AV Peak Grad:      22.9 mmHg AV Mean Grad:      11.5 mmHg LVOT Vmax:         117.00 cm/s LVOT Vmean:        80.600 cm/s LVOT VTI:          0.189 m LVOT/AV VTI ratio: 0.48  AORTA Ao Root diam: 2.80 cm MITRAL VALVE MV Area (PHT): 3.54 cm     SHUNTS MV Peak grad:  23.2 mmHg    Systemic VTI:  0.19 m MV Mean grad:  8.0 mmHg     Systemic Diam: 1.70 cm MV Vmax:       2.41 m/s MV Vmean:      124.0 cm/s MV Decel Time: 214 msec MV E velocity: 184.00 cm/s Marca Ancona MD Electronically signed by Marca Ancona MD Signature Date/Time: 03/11/2020/6:52:22 PM    Final     Review of Systems  Constitutional: Positive for fever. Negative for chills and diaphoresis.  HENT: Negative for ear discharge, ear pain, hearing loss and tinnitus.   Eyes: Negative for photophobia and pain.  Respiratory: Negative for cough and shortness of breath.   Cardiovascular: Negative for chest pain.  Gastrointestinal: Negative for abdominal pain, nausea and vomiting.  Genitourinary: Negative for dysuria, flank pain, frequency and urgency.  Musculoskeletal: Positive for arthralgias (Right hand). Negative for back pain, myalgias and neck pain.  Neurological: Negative for dizziness and headaches.  Hematological: Does not bruise/bleed easily.  Psychiatric/Behavioral: The patient is not nervous/anxious.    Blood pressure 101/66, pulse 90, temperature 98.5 F (36.9 C), temperature source Oral, resp. rate 15, height 5\' 9"  (1.753 m), weight 76.6 kg, SpO2 100 %. Physical Exam  Constitutional: He appears well-developed and well-nourished. No distress.  HENT:  Head: Normocephalic and atraumatic.  Eyes: Conjunctivae are normal. Right eye exhibits no  discharge. Left eye exhibits no discharge. No scleral icterus.  Cardiovascular: Normal rate and regular rhythm.  Respiratory: Effort normal. No respiratory distress.  Musculoskeletal:     Cervical back: Normal range of motion.     Comments: Right shoulder, elbow, wrist, digits- Punctate wound dorsum of hand, mild diffuse edema, minimal diffuse TTP, no instability, no blocks to motion except wrist extension limited 2/2 pain  Sens  Ax/R/M/U intact  Mot   Ax/ R/ PIN/ M/ AIN/ U intact  Rad 2+  Neurological: He is alert.  Skin: Skin is warm and dry. He is not diaphoretic.  Psychiatric: He has a normal mood and affect. His behavior is normal.    Assessment/Plan: Right hand thrombophlebitis -- I see no surgical indication at this time. Would continue to treat medically. Dr. to evaluate personally later today.    Merlyn Lot, PA-C Orthopedic Surgery [phone] 03/13/2020, 10:57 AM   Addendum:  Patient seen and examined.  Agree with above. S/p mitral valve replacement.  Pain and swelling in right hand over past few days.  Had IV which has been removed.  Also states one of his ID bands was tight on right wrist and this was fixed yesterday.  Overall hand feels improved over past couple of days.  Had measured fever, but did not feel feverish.  Pain improving. Exam: right hand intact sensation and capillary refill all digits.  Moving all digits and wrist without pain.  Swelling in dorsum of hand and some in digits.  No erythema.  Mild warmth.  Tender to palpation centrally at wrist but not elsewhere in wrist.  Minimal tenderness dorsum of hand and at previous IV site.  No active drainage. A/P: right hand thrombophelbitis.  CT reviewed and shows no definable or drainable abscess.  Radiologist report notes thrombosed vessel.  WBC elevated but trending down.  Will order ultrasound right hand/wrist to eval for fluid collection today.  At this time recommend elevation of hand and motion of  digits.  May use wrist splint for comfort.

## 2020-03-13 NOTE — Progress Notes (Addendum)
[address]       [place] [address]             [phone]       7 Days Post-Op Procedure(s) (LRB): MINIMALLY INVASIVE MITRAL VALVE (MV) REPLACEMENT using Carbomedics Optiform 33 MM Mitral Bioprosthetic Valve. (Right) TRANSESOPHAGEAL ECHOCARDIOGRAM (TEE) (N/A) Subjective: Says his right hand feels better today but had more fever last night to 170f. Denies cough or dysuria.  Appetite reasonable.    Objective: Vital signs in last 24 hours: Temp:  [98.4 F (36.9 C)-101.6 F (38.7 C)] 98.4 F (36.9 C) (06/04 0641) Pulse Rate:  [66-119] 78 (06/04 0300) Cardiac Rhythm: Atrial fibrillation (06/03 1901) Resp:  [14-19] 18 (06/04 0300) BP: (99-132)/(74-88) 128/86 (06/04 0300) SpO2:  [91 %-96 %] 95 % (06/04 0300) Weight:  [76.6 kg] 76.6 kg (06/04 0300)     Intake/Output from previous day: 06/03 0701 - 06/04 0700 In: 500 [IV Piggyback:500] Out: 200 [Urine:200] Intake/Output this shift: No intake/output data recorded.  Physical Exam: General appearance: alert, cooperative and no distress Neurologic: intact Heart: irregularly irregular rhythm, VR 90-130. Lungs: Breath sounds are clear Abdomen: soft and non-tender Extremities: All warm and well perfused, no edema. Dorsum of right hand has some tenderness proximal to old IV site, the hand has increased warmth compared to yesterday. No drainage from the IV site.   Wound: The chest incision and right femoral cannulation sites are intact and dry.   Lab Results: Recent Labs    03/12/20 0743 03/13/20 0437  WBC 24.2* 23.0*  HGB 10.0* 9.6*  HCT 30.9* 29.7*  PLT 261 278   BMET:  Recent Labs    03/12/20 0743 03/13/20 0437  NA 134* 132*  K 3.5 3.8  CL 96* 97*  CO2 27 24  GLUCOSE 114* 123*  BUN 18 13  CREATININE 1.01 0.92  CALCIUM 9.1 9.1    PT/INR:  Recent Labs    03/13/20 0437  LABPROT 19.6*  INR 1.7*   ABG    Component Value Date/Time   PHART 7.329 (L) 03/06/2020 1537   HCO3 24.7  03/06/2020 1537   TCO2 26 03/06/2020 1537   ACIDBASEDEF 2.0 03/06/2020 1537   O2SAT 98.0 03/06/2020 1537   CBG (last 3)  Recent Labs    03/12/20 1614 03/12/20 2135 03/13/20 0623  GLUCAP 119* 140* 109*    Assessment/Plan: S/P Procedure(s) (LRB): MINIMALLY INVASIVE MITRAL VALVE (MV) REPLACEMENT using Carbomedics Optiform 33 MM Mitral Bioprosthetic Valve. (Right) TRANSESOPHAGEAL ECHOCARDIOGRAM (TEE) (N/A)  -[id number] invasive mechanicalMV replacement for rheumatic valve disease. INR 1.7,  Coumadin  7.5mg  today.  -Fever, Gm negative bacteremia- Enterobacter cultured, on day 3 cefepime per ID recommendations. Tmax 101.6, WBC minimally changed from yesterday.  Echo showed normal function of the prosthetic mitral valve, CT of right hand 03/11/20 c/w thrombophlebitis and no fluid collections.   Suspect this resulted from infusion of IV amiodarone in a peripheral IV at this site. Concerned he has spiked another fever since removal of the IV and the right hand continues to be swollen and inflamed. Sensitivities not yet available from first setr of cultures. Repeat blood cultures obtained today.    -Post-op atrial fibrillation / flutter-- mostly rate controlled with low-dose metoprolol and oral amiodarone.Converted to oral amiodarone 6/2. K+ 3.8 Replacement ordered. BP should now tolerate increase in metoprolol to 25mg  bid.  -DVT PPX- continue enoxaparin until INR therapeutic.    LOS: 7 days    , PA-C [phone]  03/13/2020   Addendum:   10:12am Enterobacter from blood cultures on 6/2 is sensitive to cefepime.  Recurrent fever, persistent leukocytosis and right hand swelling discussed with Dr. Servando Snare. We have requested hand surgery consult.   [name], PA-C  Recurrent fever last night, have asked hand surgery help with decision about hand infection  inr 1.7 today On antibiotics and Cordarone- monitoring inr and coumadin  I have seen and examined  Melvin Burns and agree with the above assessment  and plan.  Grace Isaac MD Beeper [phone] Office [phone] 03/13/2020 4:19 PM

## 2020-03-14 DIAGNOSIS — I808 Phlebitis and thrombophlebitis of other sites: Secondary | ICD-10-CM

## 2020-03-14 DIAGNOSIS — B9689 Other specified bacterial agents as the cause of diseases classified elsewhere: Secondary | ICD-10-CM

## 2020-03-14 DIAGNOSIS — T827XXD Infection and inflammatory reaction due to other cardiac and vascular devices, implants and grafts, subsequent encounter: Secondary | ICD-10-CM

## 2020-03-14 DIAGNOSIS — R7881 Bacteremia: Secondary | ICD-10-CM

## 2020-03-14 LAB — BASIC METABOLIC PANEL
Anion gap: 9 (ref 5–15)
BUN: 12 mg/dL (ref 6–20)
CO2: 26 mmol/L (ref 22–32)
Calcium: 9.4 mg/dL (ref 8.9–10.3)
Chloride: 100 mmol/L (ref 98–111)
Creatinine, Ser: 0.9 mg/dL (ref 0.61–1.24)
GFR calc Af Amer: >60 mL/min (ref >60)
GFR calc non Af Amer: >60 mL/min (ref >60)
Glucose, Bld: 116 mg/dL — ABNORMAL HIGH (ref 70–99)
Potassium: 4.2 mmol/L (ref 3.5–5.1)
Sodium: 135 mmol/L (ref 135–145)

## 2020-03-14 LAB — CBC
HCT: 31.1 % — ABNORMAL LOW (ref 39.0–52.0)
Hemoglobin: 10 g/dL — ABNORMAL LOW (ref 13.0–17.0)
MCH: 28.5 pg (ref 26.0–34.0)
MCHC: 32.2 g/dL (ref 30.0–36.0)
MCV: 88.6 fL (ref 80.0–100.0)
Platelets: 348 10*3/uL (ref 150–400)
RBC: 3.51 MIL/uL — ABNORMAL LOW (ref 4.22–5.81)
RDW: 13.1 % (ref 11.5–15.5)
WBC: 23.2 10*3/uL — ABNORMAL HIGH (ref 4.0–10.5)
nRBC: 0.1 % (ref 0.0–0.2)

## 2020-03-14 LAB — PROTIME-INR
INR: 1.9 — ABNORMAL HIGH (ref 0.8–1.2)
Prothrombin Time: 21.5 seconds — ABNORMAL HIGH (ref 11.4–15.2)

## 2020-03-14 LAB — GLUCOSE, CAPILLARY
Glucose-Capillary: 114 mg/dL — ABNORMAL HIGH (ref 70–99)
Glucose-Capillary: 125 mg/dL — ABNORMAL HIGH (ref 70–99)

## 2020-03-14 MED ORDER — WARFARIN SODIUM 7.5 MG PO TABS
7.5000 mg | ORAL_TABLET | Freq: Every day | ORAL | Status: DC
  Administered 2020-03-14: 7.5 mg via ORAL
  Filled 2020-03-14: qty 1

## 2020-03-14 MED ORDER — WARFARIN SODIUM 5 MG PO TABS: 5.0000 mg | ORAL_TABLET | Freq: Every day | ORAL | Status: DC

## 2020-03-14 MED ORDER — SODIUM CHLORIDE 0.9 % IV SOLN
2.0000 g | INTRAVENOUS | Status: DC
  Administered 2020-03-14 – 2020-03-15 (×2): 2 g via INTRAVENOUS
  Filled 2020-03-14: qty 2
  Filled 2020-03-14: qty 20
  Filled 2020-03-14: qty 2

## 2020-03-14 MED ORDER — SODIUM CHLORIDE 0.9 % IV SOLN: 1.0000 g | INTRAVENOUS | Status: DC

## 2020-03-14 NOTE — Progress Notes (Signed)
Patient ID: Melvin Burns, male   DOB: 08-26-1966, 54 y.o.   MRN: [id number]         [facility] for Infectious Disease  Date of Admission:  03/06/2020           Day 4 cefepime ASSESSMENT: He has IV associated thrombophlebitis complicated by Enterobacter bacteremia.  He is improving.  Repeat blood cultures are negative at 24 hours.  I will narrow IV antibiotic therapy to ceftriaxone.  He can convert to oral ciprofloxacin soon.  PLAN: 1. Change cefepime to ceftriaxone  Principal Problem:   Bacteremia due to Enterobacter species Active Problems:   Essential hypertension   Hypertension, benign   Rheumatic mitral stenosis with regurgitation   Mitral valve stenosis, severe   Moderate mitral regurgitation   S/P minimally-invasive mitral valve replacement with mechanical valve   IV site infection (HCC)   Scheduled Meds: . amiodarone  400 mg Oral BID  . amLODipine  10 mg Oral Daily  . aspirin EC  81 mg Oral Daily  . bisacodyl  10 mg Oral Daily   Or  . bisacodyl  10 mg Rectal Daily  . docusate sodium  200 mg Oral Daily  . enoxaparin (LOVENOX) injection  40 mg Subcutaneous QHS  . furosemide  40 mg Oral Daily  . mouth rinse  15 mL Mouth Rinse BID  . metoprolol tartrate  25 mg Oral BID  . pantoprazole  40 mg Oral Daily  . potassium chloride  20 mEq Oral Daily  . sodium chloride flush  3 mL Intravenous Q12H  . warfarin  7.5 mg Oral q1600  . Warfarin - Physician Dosing Inpatient   Does not apply q1600   Continuous Infusions: . ceFEPime (MAXIPIME) IV 2 g (03/14/20 0616)  . nitroGLYCERIN Stopped (03/07/20 1321)   PRN Meds:.metoprolol tartrate, ondansetron (ZOFRAN) IV, oxyCODONE, traMADol   SUBJECTIVE: He is feeling better.  He notes less pain and swelling in his right hand.  Review of Systems: Review of Systems  Constitutional: Negative for chills, diaphoresis and fever.  Musculoskeletal: Positive for joint pain.    No Known Allergies  OBJECTIVE: Vitals:   03/14/20 0825 03/14/20 0826 03/14/20 1032 03/14/20 1142  BP: 123/78  120/87 111/79  Pulse: 82 (!) 110  80  Resp: 13  16 16   Temp: 99 F (37.2 C)   98.2 F (36.8 C)  TempSrc: Oral   Oral  SpO2: 96%   96%  Weight:      Height:       Body mass index is 26.83 kg/m.  Physical Exam Constitutional:      Comments: He is very pleasant and in no distress.  He sitting up on the side of his bed.  Musculoskeletal:     Comments: He has swelling on the dorsum of his right hand and wrist, especially around a previous IV insertion site.  There is no drainage.  He indicates that the swelling has decreased.  Ultrasound revealed superficial thrombophlebitis without abscess.  Psychiatric:        Mood and Affect: Mood normal.     Lab Results Lab Results  Component Value Date   WBC 23.2 (H) 03/14/2020   HGB 10.0 (L) 03/14/2020   HCT 31.1 (L) 03/14/2020   MCV 88.6 03/14/2020   PLT 348 03/14/2020    Lab Results  Component Value Date   CREATININE 0.90 03/14/2020   BUN 12 03/14/2020   NA 135 03/14/2020   K 4.2 03/14/2020   CL 100  03/14/2020   CO2 26 03/14/2020    Lab Results  Component Value Date   ALT 25 03/02/2020   AST 23 03/02/2020   ALKPHOS 65 03/02/2020   BILITOT 0.4 03/02/2020     Microbiology: Recent Results (from the past 240 hour(s))  Culture, blood (routine x 2)     Status: Abnormal   Collection Time: 03/10/20  7:21 PM   Specimen: BLOOD  Result Value Ref Range Status   Specimen Description BLOOD LEFT ANTECUBITAL  Final   Special Requests   Final    BOTTLES DRAWN AEROBIC AND ANAEROBIC Blood Culture adequate volume   Culture  Setup Time   Final    GRAM NEGATIVE RODS IN BOTH AEROBIC AND ANAEROBIC BOTTLES CRITICAL VALUE NOTED.  VALUE IS CONSISTENT WITH PREVIOUSLY REPORTED AND CALLED VALUE.    Culture (A)  Final    ENTEROBACTER CLOACAE SUSCEPTIBILITIES PERFORMED ON PREVIOUS CULTURE WITHIN THE LAST 5 DAYS. Performed at [facility], [address]  [place], [place] [address]    Report Status 03/13/2020 FINAL  Final  Culture, blood (routine x 2)     Status: Abnormal   Collection Time: 03/10/20  7:27 PM   Specimen: BLOOD LEFT WRIST  Result Value Ref Range Status   Specimen Description BLOOD LEFT WRIST  Final   Special Requests   Final    BOTTLES DRAWN AEROBIC AND ANAEROBIC Blood Culture adequate volume   Culture  Setup Time   Final    GRAM NEGATIVE RODS IN BOTH AEROBIC AND ANAEROBIC BOTTLES CRITICAL RESULT CALLED TO, READ BACK BY AND VERIFIED WITH: Peter Minium PharmD 11:10 03/11/20 ([email or web address]) Performed at [facility], [address] [place], [place] [address]    Culture ENTEROBACTER CLOACAE (A)  Final   Report Status 03/13/2020 FINAL  Final   Organism ID, Bacteria ENTEROBACTER CLOACAE  Final      Susceptibility   Enterobacter cloacae - MIC*    CEFAZOLIN >=64 RESISTANT Resistant     CEFEPIME <=1 SENSITIVE Sensitive     CEFTAZIDIME <=1 SENSITIVE Sensitive     CEFTRIAXONE <=1 SENSITIVE Sensitive     CIPROFLOXACIN <=0.25 SENSITIVE Sensitive     GENTAMICIN <=1 SENSITIVE Sensitive     IMIPENEM <=0.25 SENSITIVE Sensitive     TRIMETH/SULFA <=20 SENSITIVE Sensitive     PIP/TAZO <=4 SENSITIVE Sensitive     * ENTEROBACTER CLOACAE  Blood Culture ID Panel (Reflexed)     Status: Abnormal   Collection Time: 03/10/20  7:27 PM  Result Value Ref Range Status   Enterococcus species NOT DETECTED NOT DETECTED Final   Listeria monocytogenes NOT DETECTED NOT DETECTED Final   Staphylococcus species NOT DETECTED NOT DETECTED Final   Staphylococcus aureus (BCID) NOT DETECTED NOT DETECTED Final   Streptococcus species NOT DETECTED NOT DETECTED Final   Streptococcus agalactiae NOT DETECTED NOT DETECTED Final   Streptococcus pneumoniae NOT DETECTED NOT DETECTED Final   Streptococcus pyogenes NOT DETECTED NOT DETECTED Final   Acinetobacter baumannii NOT DETECTED NOT DETECTED Final   Enterobacteriaceae species DETECTED (A) NOT DETECTED Final    Comment:  Enterobacteriaceae represent a large family of gram-negative bacteria, not a single organism. CRITICAL RESULT CALLED TO, READ BACK BY AND VERIFIED WITH: Peter Minium PharmD 11:10 03/11/20 ([email or web address])    Enterobacter cloacae complex DETECTED (A) NOT DETECTED Final    Comment: CRITICAL RESULT CALLED TO, READ BACK BY AND VERIFIED WITH: Peter Minium PharmD 11:10 03/11/20 ([email or web address])    Escherichia coli NOT DETECTED NOT DETECTED  Final   Klebsiella oxytoca NOT DETECTED NOT DETECTED Final   Klebsiella pneumoniae NOT DETECTED NOT DETECTED Final   Proteus species NOT DETECTED NOT DETECTED Final   Serratia marcescens NOT DETECTED NOT DETECTED Final   Carbapenem resistance NOT DETECTED NOT DETECTED Final   Haemophilus influenzae NOT DETECTED NOT DETECTED Final   Neisseria meningitidis NOT DETECTED NOT DETECTED Final   Pseudomonas aeruginosa NOT DETECTED NOT DETECTED Final   Candida albicans NOT DETECTED NOT DETECTED Final   Candida glabrata NOT DETECTED NOT DETECTED Final   Candida krusei NOT DETECTED NOT DETECTED Final   Candida parapsilosis NOT DETECTED NOT DETECTED Final   Candida tropicalis NOT DETECTED NOT DETECTED Final    Comment: Performed at [facility], [address], [place], [place] [address]  Culture, blood (routine x 2)     Status: None (Preliminary result)   Collection Time: 03/13/20  4:27 AM   Specimen: BLOOD  Result Value Ref Range Status   Specimen Description BLOOD SITE NOT SPECIFIED  Final   Special Requests   Final    BOTTLES DRAWN AEROBIC AND ANAEROBIC Blood Culture adequate volume   Culture   Final    NO GROWTH 1 DAY Performed at [facility], [address], [place], [place] [address]    Report Status PENDING  Incomplete  Culture, blood (routine x 2)     Status: None (Preliminary result)   Collection Time: 03/13/20  4:41 AM   Specimen: BLOOD  Result Value Ref Range Status   Specimen Description BLOOD SITE NOT SPECIFIED  Final   Special Requests   Final    BOTTLES  DRAWN AEROBIC AND ANAEROBIC Blood Culture adequate volume   Culture   Final    NO GROWTH 1 DAY Performed at [facility], [address] [place], [place] [address]    Report Status PENDING  Incomplete    Cliffton Asters, MD [facility] for Infectious Disease [facility] [phone] pager   [phone] cell 03/14/2020, 12:39 PM

## 2020-03-14 NOTE — Progress Notes (Addendum)
[address]       [place] [address]             [phone]        8 Days Post-Op Procedure(s) (LRB): MINIMALLY INVASIVE MITRAL VALVE (MV) REPLACEMENT using Carbomedics Optiform 33 MM Mitral Bioprosthetic Valve. (Right) TRANSESOPHAGEAL ECHOCARDIOGRAM (TEE) (N/A)  Subjective: Patient states right hand is less painful with splint and he is able to move fingers more.  Objective: Vital signs in last 24 hours: Temp:  [97.4 F (36.3 C)-99.5 F (37.5 C)] 98 F (36.7 C) (06/05 0532) Pulse Rate:  [61-95] 61 (06/05 0535) Cardiac Rhythm: Atrial fibrillation (06/04 1900) Resp:  [14-24] 14 (06/05 0535) BP: (101-122)/(66-82) 112/82 (06/05 0535) SpO2:  [72 %-100 %] 72 % (06/05 0535) Weight:  [82.4 kg] 82.4 kg (06/05 0535)  Pre op weight 81.6 kg Current Weight  03/14/20 82.4 kg      Intake/Output from previous day: 06/04 0701 - 06/05 0700 In: 200 [IV Piggyback:200] Out: -    Physical Exam:  Cardiovascular: IRRR IRRR, sharp valve click Pulmonary: Clear to auscultation bilaterally Abdomen: Soft, non tender, bowel sounds present. Extremities: No lower extremity edema. Right hand edema;splint in place Wounds: Clean and dry.  No erythema or signs of infection.  Lab Results: CBC: Recent Labs    03/13/20 0437 03/14/20 0526  WBC 23.0* 23.2*  HGB 9.6* 10.0*  HCT 29.7* 31.1*  PLT 278 348   BMET:  Recent Labs    03/13/20 0437 03/14/20 0526  NA 132* 135  K 3.8 4.2  CL 97* 100  CO2 24 26  GLUCOSE 123* 116*  BUN 13 12  CREATININE 0.92 0.90  CALCIUM 9.1 9.4    PT/INR:  Lab Results  Component Value Date   INR 1.9 (H) 03/14/2020   INR 1.7 (H) 03/13/2020   INR 1.7 (H) 03/12/2020   ABG:  INR: Will add last result for INR, ABG once components are confirmed Will add last 4 CBG results once components are confirmed  Assessment/Plan:  1. CV - Post op a fib/flutter with HR up in the 110's at times (which he has been). On Amiodarone 400 mg bid,  Amlodipine 10 mg daily, Lopressor 25 mg bid, and Coumadin. INR this am slightly increased to 1. 9(he has a mechanical valve).  2.  Pulmonary - On room air. Encourage incentive spirometer. 3. Volume Overload - On Lasix 40 mg daily 4.  Expected post op acute blood loss anemia - H and H this am stable at 10 and 31.1 5. CBGs 104/121/114. No history of diabetes and pre op HGA1C 5.2. Stop accu checks and SS PRN 6. ID-on Cefepime for Enterobacter Cloacae bactermia. Leukocytosis-WBC this am 23,200. Per infectious disease, will switch to Cipro 500 mg bid to complete 14 day course(this will have less interaction than bactrim with his coumadin). Blood culture show no growth to date. 7. Right hand cellulitis/thrombophlebitis-US soft tissue right hand-superficial thrombophlebitis of wrist and hand with subcutaneous edema but no abscess. Appreciate Dr. Merrilee Seashore assistance  Melvin M ZimmermanPA-C 03/14/2020,7:17 AM    Patient remains afebrile since last night White count remains elevated at 23,000 Follow-up blood culture is negative to date Thrombophlebitis cellulitis of hand evaluated by hand surgery-patient is wearing a splint for comfort and surgery not needed  appreciate evaluation by ID, Dr. Orvan Falconer will follow his recommendations Observe INR for possible interaction with antibiotic to Coumadin  patient examined and medical record reviewed,agree with above note. Theron Arista  Lucianne Lei Trigt III 03/14/2020

## 2020-03-14 NOTE — Progress Notes (Signed)
Subjective: Right hand feeling improved.  Still with some swelling.  Splint helpful.   Objective: Vital signs in last 24 hours: Temp:  [97.4 F (36.3 C)-99.5 F (37.5 C)] 99 F (37.2 C) (06/05 0825) Pulse Rate:  [61-110] 110 (06/05 0826) Resp:  [13-24] 16 (06/05 1032) BP: (108-123)/(74-87) 120/87 (06/05 1032) SpO2:  [91 %-97 %] 96 % (06/05 0825) Weight:  [82.4 kg] 82.4 kg (06/05 0535)  Intake/Output from previous day: 06/04 0701 - 06/05 0700 In: 200 [IV Piggyback:200] Out: -  Intake/Output this shift: No intake/output data recorded.  Recent Labs    03/12/20 0743 03/13/20 0437 03/14/20 0526  HGB 10.0* 9.6* 10.0*   Recent Labs    03/13/20 0437 03/14/20 0526  WBC 23.0* 23.2*  RBC 3.43* 3.51*  HCT 29.7* 31.1*  PLT 278 348   Recent Labs    03/13/20 0437 03/14/20 0526  NA 132* 135  K 3.8 4.2  CL 97* 100  CO2 24 26  BUN 13 12  CREATININE 0.92 0.90  GLUCOSE 123* 116*  CALCIUM 9.1 9.4   Recent Labs    03/13/20 0437 03/14/20 0526  INR 1.7* 1.9*    Intact capillary refill all digits.  Moving fingers and wrist more without pain.  Still with some swelling in hand.  No erythema.  Tender proximal to IV site extending over wrist.      Assessment/Plan: Right hand/wrist thrombophlebitis/cellulitis.  Improving.  Ultrasound confirms thrombophlebitis.  No defined abscess.  Wrist splint for comfort.  Okay to remove for ROM, which will help some with swelling.    Betha Loa 03/14/2020, 11:13 AM

## 2020-03-14 NOTE — Progress Notes (Signed)
CARDIAC REHAB PHASE I   PRE:  Rate/Rhythm: 83 SR  BP:  Supine:   Sitting: 109/80  Standing:    SaO2: 98% RA  MODE:  Ambulation: 470 ft   POST:  Rate/Rhythm: 85 SR  BP:  Supine:   Sitting: 120/87  Standing:    SaO2: 96% RA  1018-1040 Patient tolerated ambulation well, independently, without symptoms. To bedside after walk, vital signs stable. Artist Pais, MS, ACSM CEP

## 2020-03-14 NOTE — Progress Notes (Signed)
Mobility Specialist - Progress Note   03/14/20 1259  Mobility  Activity Ambulated in hall  Level of Assistance Independent  Assistive Device None  Distance Ambulated (ft) 1600 ft  Mobility Response Tolerated well  Mobility performed by Mobility specialist  $Mobility charge 1 Mobility    Pre-mobility: 90 HR Post-mobility: 96 HR  Unable to get O2 sats with pulse ox. Pt was asx throughout.   Mamie Levers Mobility Specialist

## 2020-03-14 NOTE — Progress Notes (Signed)
Patient ambulated in hallway independently will monitor patient. Melvin Burns, Randall An Rn

## 2020-03-14 NOTE — Progress Notes (Signed)
Patient converted to Normal sinus Rhythm on monitor. Will monitor patient. Melvin Burns, Randall An RN

## 2020-03-15 LAB — CBC
HCT: 30.1 % — ABNORMAL LOW (ref 39.0–52.0)
Hemoglobin: 9.6 g/dL — ABNORMAL LOW (ref 13.0–17.0)
MCH: 27.7 pg (ref 26.0–34.0)
MCHC: 31.9 g/dL (ref 30.0–36.0)
MCV: 87 fL (ref 80.0–100.0)
Platelets: 425 10*3/uL — ABNORMAL HIGH (ref 150–400)
RBC: 3.46 MIL/uL — ABNORMAL LOW (ref 4.22–5.81)
RDW: 13.2 % (ref 11.5–15.5)
WBC: 21.9 10*3/uL — ABNORMAL HIGH (ref 4.0–10.5)
nRBC: 0.1 % (ref 0.0–0.2)

## 2020-03-15 LAB — PROTIME-INR
INR: 2.3 — ABNORMAL HIGH (ref 0.8–1.2)
Prothrombin Time: 24.8 seconds — ABNORMAL HIGH (ref 11.4–15.2)

## 2020-03-15 MED ORDER — WARFARIN SODIUM 5 MG PO TABS
5.0000 mg | ORAL_TABLET | Freq: Every day | ORAL | Status: DC
  Administered 2020-03-15 – 2020-03-17 (×3): 5 mg via ORAL
  Filled 2020-03-15 (×3): qty 1

## 2020-03-15 NOTE — Progress Notes (Signed)
Patient ambulated in hallway independently. Caci Orren Jessup RN  

## 2020-03-15 NOTE — Progress Notes (Addendum)
      [address]       [place] [address]             [phone]        9 Days Post-Op Procedure(s) (LRB): MINIMALLY INVASIVE MITRAL VALVE (MV) REPLACEMENT using Carbomedics Optiform 33 MM Mitral Bioprosthetic Valve. (Right) TRANSESOPHAGEAL ECHOCARDIOGRAM (TEE) (N/A)  Subjective: Patient without specific complaint this am.  Objective: Vital signs in last 24 hours: Temp:  [98.2 F (36.8 C)-99 F (37.2 C)] 98.9 F (37.2 C) (06/06 0531) Pulse Rate:  [80-110] 95 (06/06 0531) Cardiac Rhythm: Atrial fibrillation (06/06 0717) Resp:  [13-20] 20 (06/06 0531) BP: (111-147)/(69-90) 111/84 (06/06 0531) SpO2:  [93 %-97 %] 95 % (06/06 0531) Weight:  [75.6 kg] 75.6 kg (06/06 0531)  Pre op weight 81.6 kg Current Weight  03/15/20 75.6 kg      Intake/Output from previous day: 06/05 0701 - 06/06 0700 In: 240 [P.O.:240] Out: -    Physical Exam:  Cardiovascular: IRRR IRRR, sharp valve click Pulmonary: Clear to auscultation bilaterally Abdomen: Soft, non tender, bowel sounds present. Extremities: No lower extremity edema. Right hand edema and infiltrated IV side back of right hand Wounds: Clean and dry.  No erythema or signs of infection.  Lab Results: CBC: Recent Labs    03/14/20 0526 03/15/20 0411  WBC 23.2* 21.9*  HGB 10.0* 9.6*  HCT 31.1* 30.1*  PLT 348 425*   BMET:  Recent Labs    03/13/20 0437 03/14/20 0526  NA 132* 135  K 3.8 4.2  CL 97* 100  CO2 24 26  GLUCOSE 123* 116*  BUN 13 12  CREATININE 0.92 0.90  CALCIUM 9.1 9.4    PT/INR:  Lab Results  Component Value Date   INR 2.3 (H) 03/15/2020   INR 1.9 (H) 03/14/2020   INR 1.7 (H) 03/13/2020   ABG:  INR: Will add last result for INR, ABG once components are confirmed Will add last 4 CBG results once components are confirmed  Assessment/Plan:  1. CV - Post op a fib/flutter with HR up in the 110's at times (which he has been). On Amiodarone 400 mg bid, Amlodipine 10 mg daily,  Lopressor 25 mg bid, and Coumadin. INR this am  increased to 2.3 (he has a mechanical valve) so will decrease Coumadin to 5 mg daily. 2.  Pulmonary - On room air. Encourage incentive spirometer. 3. Volume Overload - On Lasix 40 mg daily 4.  Expected post op acute blood loss anemia - H and H this am stable at 9.6 and 30.1 5. ID-switched to Ceftriaxone yesterday for Enterobacter Cloacae bactermia. Leukocytosis-WBC this am 21,900. Per infectious disease, will switch to Cipro 500 mg bid to complete 14 day course(this will have less interaction than bactrim with his coumadin). Blood culture show no growth to date. 6. Right hand cellulitis/thrombophlebitis-US soft tissue right hand-superficial thrombophlebitis of wrist and hand with subcutaneous edema but no abscess. Appreciate Dr. Merrilee Seashore assistance 7. Possible discharge in 1-2 days  Donielle M ZimmermanPA-C 03/15/2020,7:27 AM   Patient remains afebrile and dorsum of right hand continues to improve White count remains moderately elevated but little change from lung surgery. ID recommends IV ceftriaxone while hospitalized then conversion to Cipro 2 weeks at home.  Should be ready for discharge soon.  INR 2.3, Coumadin 5 mg ordered for this p.m. patient examined and medical record reviewed,agree with above note. Kathlee Nations Trigt III 03/15/2020

## 2020-03-15 NOTE — Progress Notes (Signed)
Mobility Specialist - Progress Note   03/15/20 1032  Mobility  Activity Ambulated in hall  Level of Assistance Independent  Assistive Device None  Distance Ambulated (ft) 1400 ft  Mobility Response Tolerated well  Mobility performed by Mobility specialist  $Mobility charge 1 Mobility    Pre-mobility: 88 HR Post-mobility: 89 HR  Nashly Olsson Technical sales engineer

## 2020-03-15 NOTE — Progress Notes (Signed)
Patient ID: Melvin Burns, male   DOB: August 16, 1966, 54 y.o.   MRN: [id number]          [facility] for Infectious Disease    Date of Admission:  03/06/2020   Total days of antibiotics 5         [name] remains afebrile on therapy for superficial thrombophlebitis of his right hand complicated by Enterobacter bacteremia.  Repeat blood cultures are negative at 48 hours.  I recommend continuing ceftriaxone until discharge and then converting to oral ciprofloxacin to complete a total of 14 days of therapy.  I will sign off now but please call if we can be of further assistance while he is here.         Cliffton Asters, MD [facility] for Infectious Disease [facility] [phone] pager   [phone] cell 03/15/2020, 10:07 AM

## 2020-03-16 ENCOUNTER — Inpatient Hospital Stay: Payer: Self-pay

## 2020-03-16 LAB — PROTIME-INR
INR: 2.8 — ABNORMAL HIGH (ref 0.8–1.2)
Prothrombin Time: 28.4 seconds — ABNORMAL HIGH (ref 11.4–15.2)

## 2020-03-16 LAB — CBC
HCT: 30.2 % — ABNORMAL LOW (ref 39.0–52.0)
Hemoglobin: 9.4 g/dL — ABNORMAL LOW (ref 13.0–17.0)
MCH: 27.2 pg (ref 26.0–34.0)
MCHC: 31.1 g/dL (ref 30.0–36.0)
MCV: 87.5 fL (ref 80.0–100.0)
Platelets: 437 10*3/uL — ABNORMAL HIGH (ref 150–400)
RBC: 3.45 MIL/uL — ABNORMAL LOW (ref 4.22–5.81)
RDW: 13.2 % (ref 11.5–15.5)
WBC: 28.7 10*3/uL — ABNORMAL HIGH (ref 4.0–10.5)
nRBC: 0 % (ref 0.0–0.2)

## 2020-03-16 MED ORDER — ZOLPIDEM TARTRATE 5 MG PO TABS: 5.0000 mg | ORAL_TABLET | Freq: Every evening | ORAL | Status: DC | PRN

## 2020-03-16 MED ORDER — SODIUM CHLORIDE 0.9 % IV SOLN
2.0000 g | Freq: Three times a day (TID) | INTRAVENOUS | Status: DC
  Administered 2020-03-16 – 2020-03-18 (×8): 2 g via INTRAVENOUS
  Filled 2020-03-16 (×10): qty 2

## 2020-03-16 MED ORDER — CHLORHEXIDINE GLUCONATE CLOTH 2 % EX PADS
6.0000 | MEDICATED_PAD | Freq: Every day | CUTANEOUS | Status: DC
  Administered 2020-03-16 – 2020-03-18 (×2): 6 via TOPICAL

## 2020-03-16 MED ORDER — CEFEPIME IV (FOR PTA / DISCHARGE USE ONLY): 2.0000 g | Freq: Three times a day (TID) | INTRAVENOUS | 0 refills | Status: DC

## 2020-03-16 MED ORDER — SODIUM CHLORIDE 0.9% FLUSH: 10.0000 mL | INTRAVENOUS | Status: DC | PRN

## 2020-03-16 NOTE — TOC Initial Note (Addendum)
Transition of Care (TOC) - Initial/Assessment Note  Marvetta Gibbons RN, BSN Transitions of Care Unit 4E- RN Case Manager [phone]   Patient Details  Name: Melvin Burns MRN: [id number] Date of Birth: Jan 05, 1966  Transition of Care [facility]) CM/SW Contact:    Dawayne Patricia, RN Phone Number: 03/16/2020, 2:00 PM  Clinical Narrative:                 Pt admitted from home s/p mini MVR post op complicated by afib and elevated WBCs, plan for pt to go home with PICC and 2wks of IV abx per ID. PICC placement pending. Orders placed for [facility] and OPAT needs. ID has sent referral to [facility] with [facility] pharmacy for home IV abx needs. Cm spoke with pt at bedside regarding transition of care needs. Per pt he lives alone, has family/friends that can assist if needed pt states he prefers to handle it on his own if possible. Pt ambulating independently- states he has not been using any assist device.  Discussed with pt HH needs for IV abx and ID referral to [facility]- pt states he does not have a preference for agency and [place] needs- and he is fine using whomever doctors feel is able to meet his needs. Pt does not have any other HH needs besides the home IV abx- spoke with [facility] at [facility] and she will reach out to [facility] to see if they are able to coordinate needed nursing needs for PICC line care and labs. Pam will plan to come see patient this afternoon for bedside teaching and education in prep for possible discharge tomorrow.       Expected Discharge Plan: [place] [facility] Barriers to Discharge: Other (comment)(PICC line placement)   Patient Goals and CMS Choice Patient states their goals for this hospitalization and ongoing recovery are:: to get home and get better CMS Medicare.gov Compare Post Acute Care list provided to:: Patient Choice offered to / list presented to : Patient  Expected Discharge Plan and Services Expected Discharge Plan: [place]   Discharge Planning Services: CM Consult Post Acute Care Choice: [place] arrangements for the past 2 months: Apartment                 DME Arranged: N/A DME Agency: NA       HH Arranged: RN, IV Antibiotics HH Agency: [facility] Date HH Agency Contacted: 03/16/20 Time HH Agency Contacted: 1200 Representative spoke with at [place]: [place] Arrangements/Services Living arrangements for the past 2 months: [place] with:: Self Patient language and need for interpreter reviewed:: Yes Do you feel safe going back to the place where you live?: Yes      Need for Family Participation in Patient Care: Yes (Comment) Care giver support system in place?: Yes (comment)   Criminal Activity/Legal Involvement Pertinent to Current Situation/Hospitalization: No - Comment as needed  Activities of Daily Living Home Assistive Devices/Equipment: Eyeglasses, Dentures (specify type) ADL Screening (condition at time of admission) Patient's cognitive ability adequate to safely complete daily activities?: Yes Is the patient deaf or have difficulty hearing?: No Does the patient have difficulty seeing, even when wearing glasses/contacts?: No Does the patient have difficulty concentrating, remembering, or making decisions?: No Patient able to express need for assistance with ADLs?: Yes Does the patient have difficulty dressing or bathing?: No Independently performs ADLs?: Yes (appropriate for developmental age) Does the patient have difficulty walking or climbing  stairs?: No Weakness of Legs: None Weakness of Arms/Hands: None  Permission Sought/Granted Permission sought to share information with : Oceanographer granted to share information with : Yes, Verbal Permission Granted     Permission granted to share info w AGENCY: [facility]        Emotional Assessment Appearance:: Appears stated  age Attitude/Demeanor/Rapport: Engaged Affect (typically observed): Appropriate, Pleasant Orientation: : Oriented to Self, Oriented to Place, Oriented to  Time, Oriented to Situation Alcohol / Substance Use: Not Applicable Psych Involvement: No (comment)  Admission diagnosis:  S/P mitral valve replacement with metallic valve [Z95.4] Patient Active Problem List   Diagnosis Date Noted  . Bacteremia due to Enterobacter species 03/13/2020  . IV site infection (HCC) 03/13/2020  . 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 Incidental pulmonary nodule 01/08/2020  . Pre-operative clearance 06/11/2019  . Mitral valve stenosis, severe 08/15/2017  . Moderate mitral regurgitation 08/15/2017  . Abdominal pain, epigastric 01/02/2017  . Non-intractable vomiting with nausea 01/02/2017  . 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 Hypertension, benign 01/09/2014  . Undiagnosed cardiac murmurs 01/09/2014  . Accelerated hypertension 03/11/2013  . Family history of stroke 03/11/2013  . Essential hypertension 03/11/2013  . Smoker 03/11/2013  . Chronic periodontitis, unspecified 03/11/2013   PCP:  Chilton Si, MD Pharmacy:   [facility] [address] [place] - [address]  [place] [address] Phone: [phone] Fax: [phone]     Social Determinants of Health (SDOH) Interventions    Readmission Risk Interventions No flowsheet data found.

## 2020-03-16 NOTE — Progress Notes (Signed)
Spoke with IV team PICC line will not be placed today 03/16/20. Ammon Muscatello, Randall An RN

## 2020-03-16 NOTE — Progress Notes (Signed)
Peripherally Inserted Central Catheter Placement  The IV Nurse has discussed with the patient and/or persons authorized to consent for the patient, the purpose of this procedure and the potential benefits and risks involved with this procedure.  The benefits include less needle sticks, lab draws from the catheter, and the patient may be discharged home with the catheter. Risks include, but not limited to, infection, bleeding, blood clot (thrombus formation), and puncture of an artery; nerve damage and irregular heartbeat and possibility to perform a PICC exchange if needed/ordered by physician.  Alternatives to this procedure were also discussed.  Bard Power PICC patient education guide, fact sheet on infection prevention and patient information card has been provided to patient /or left at bedside.    PICC Placement Documentation  PICC Single Lumen 03/16/20 PICC Left Brachial 47 cm 0 cm (Active)  Indication for Insertion or Continuance of Line Home intravenous therapies (PICC only) 03/16/20 2052  Exposed Catheter (cm) 0 cm 03/16/20 2052  Site Assessment Clean;Dry;Intact 03/16/20 2052  Line Status Blood return noted;Flushed;Saline locked 03/16/20 2052  Dressing Type Transparent;Occlusive;Securing device 03/16/20 2052  Dressing Status Clean;Dry;Intact;Antimicrobial disc in place 03/16/20 2052  Line Adjustment (NICU/IV Team Only) No 03/16/20 2052  Dressing Intervention New dressing 03/16/20 2052  Dressing Change Due 03/23/20 03/16/20 2052       Christeen Douglas 03/16/2020, 9:10 PM

## 2020-03-16 NOTE — Progress Notes (Signed)
[place] for Infectious Disease  Date of Admission:  03/06/2020     Total days of antibiotics 8         ASSESSMENT:  Melvin Burns continues to remain afebrile however now has increased leukocytosis. Blood cultures from 6/4 have remained without growth to date. Will change plan of treatment to IV therapy with Cefepime for 2 weeks to ensure adequate coverage for Enterobacter bacteremia likely related to infected PIV site. OPAT orders placed. Home Health orders below.   PLAN:  1. Change ceftriaxone to cefepime. 2. OPAT orders placed. 3. PICC line placement  4. Home Health orders below.  5. Will follow up in ID clinic.  6. OK for discharge from ID standpoint.  Diagnosis: Enterobacter bacteremia  Culture Result: Enterobacter cloacae   No Known Allergies  OPAT Orders Discharge antibiotics to be given via PICC line Discharge antibiotics: Cefepime Per pharmacy protocol  Duration: 2 weeks End Date: 03/28/19  [facility] Care Per Protocol:  Home health RN for IV administration and teaching; PICC line care and labs.    Labs weekly while on IV antibiotics: _X_ CBC with differential _X_ BMP __ CMP __ CRP __ ESR __ Vancomycin trough __ CK  _X_ Please pull PIC at completion of IV antibiotics __ Please leave PIC in place until doctor has seen patient or been notified  Fax weekly labs to [phone]  Clinic Follow Up Appt:  6/18 at 10:30 am with Terri Piedra, NP   Principal Problem:   Bacteremia due to Enterobacter species Active Problems:   IV site infection [facility])   Essential hypertension   Hypertension, benign   Rheumatic mitral stenosis with regurgitation   Mitral valve stenosis, severe   Moderate mitral regurgitation   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 amiodarone  400 mg Oral BID  . amLODipine  10 mg Oral Daily  . aspirin EC  81 mg Oral Daily  . bisacodyl  10 mg Oral Daily   Or  . bisacodyl  10 mg Rectal Daily    . docusate sodium  200 mg Oral Daily  . furosemide  40 mg Oral Daily  . mouth rinse  15 mL Mouth Rinse BID  . metoprolol tartrate  25 mg Oral BID  . pantoprazole  40 mg Oral Daily  . potassium chloride  20 mEq Oral Daily  . sodium chloride flush  3 mL Intravenous Q12H  . warfarin  5 mg Oral q1600  . Warfarin - Physician Dosing Inpatient   Does not apply q1600    SUBJECTIVE:  Afebrile overnight with noted increase in leukocytosis now up to 28.7. Feeling well today. Hand is improved. Did not sleep well last night.   No Known Allergies   Review of Systems: Review of Systems  Constitutional: Negative for chills, fever and weight loss.  Respiratory: Negative for cough, shortness of breath and wheezing.   Cardiovascular: Negative for chest pain and leg swelling.  Gastrointestinal: Negative for abdominal pain, constipation, diarrhea, nausea and vomiting.  Skin: Negative for rash.      OBJECTIVE: Vitals:   03/15/20 1942 03/15/20 2337 03/16/20 0547 03/16/20 0731  BP: 120/72 105/90 126/80 110/81  Pulse:  97 93 93  Resp: '18 20 18 ' (!) 24  Temp: 99.2 F (37.3 C) 98 F (36.7 C) 98.4 F (36.9 C) 98.9 F (37.2 C)  TempSrc: Oral Oral Oral Oral  SpO2: 94% 96% 97% 96%  Weight:   75.3 kg   Height:  Body mass index is 24.51 kg/m.  Physical Exam Constitutional:      General: He is not in acute distress.    Appearance: He is well-developed.     Comments: Lying in bed; pleasant.   Cardiovascular:     Rate and Rhythm: Normal rate and regular rhythm.     Heart sounds: Normal heart sounds.  Pulmonary:     Effort: Pulmonary effort is normal.     Breath sounds: Normal breath sounds.  Musculoskeletal:     Comments: Right hand with small nodule and mild edema.   Skin:    General: Skin is warm and dry.  Neurological:     Mental Status: He is alert and oriented to person, place, and time.  Psychiatric:        Behavior: Behavior normal.        Thought Content: Thought content  normal.        Judgment: Judgment normal.     Lab Results Lab Results  Component Value Date   WBC 28.7 (H) 03/16/2020   HGB 9.4 (L) 03/16/2020   HCT 30.2 (L) 03/16/2020   MCV 87.5 03/16/2020   PLT 437 (H) 03/16/2020    Lab Results  Component Value Date   CREATININE 0.90 03/14/2020   BUN 12 03/14/2020   NA 135 03/14/2020   K 4.2 03/14/2020   CL 100 03/14/2020   CO2 26 03/14/2020    Lab Results  Component Value Date   ALT 25 03/02/2020   AST 23 03/02/2020   ALKPHOS 65 03/02/2020   BILITOT 0.4 03/02/2020     Microbiology: Recent Results (from the past 240 hour(s))  Culture, blood (routine x 2)     Status: Abnormal   Collection Time: 03/10/20  7:21 PM   Specimen: BLOOD  Result Value Ref Range Status   Specimen Description BLOOD LEFT ANTECUBITAL  Final   Special Requests   Final    BOTTLES DRAWN AEROBIC AND ANAEROBIC Blood Culture adequate volume   Culture  Setup Time   Final    GRAM NEGATIVE RODS IN BOTH AEROBIC AND ANAEROBIC BOTTLES CRITICAL VALUE NOTED.  VALUE IS CONSISTENT WITH PREVIOUSLY REPORTED AND CALLED VALUE.    Culture (A)  Final    ENTEROBACTER CLOACAE SUSCEPTIBILITIES PERFORMED ON PREVIOUS CULTURE WITHIN THE LAST 5 DAYS. Performed at [facility], [place] [address], [place], [place] [address]    Report Status 03/13/2020 FINAL  Final  Culture, blood (routine x 2)     Status: Abnormal   Collection Time: 03/10/20  7:27 PM   Specimen: BLOOD LEFT WRIST  Result Value Ref Range Status   Specimen Description BLOOD LEFT WRIST  Final   Special Requests   Final    BOTTLES DRAWN AEROBIC AND ANAEROBIC Blood Culture adequate volume   Culture  Setup Time   Final    GRAM NEGATIVE RODS IN BOTH AEROBIC AND ANAEROBIC BOTTLES CRITICAL RESULT CALLED TO, READ BACK BY AND VERIFIED WITH: Andres Shad PharmD 11:10 03/11/20 ([email or web address]) Performed at [facility], [place] [address] [place], [place] [address]    Culture ENTEROBACTER CLOACAE (A)  Final   Report Status  03/13/2020 FINAL  Final   Organism ID, Bacteria ENTEROBACTER CLOACAE  Final      Susceptibility   Enterobacter cloacae - MIC*    CEFAZOLIN >=64 RESISTANT Resistant     CEFEPIME <=1 SENSITIVE Sensitive     CEFTAZIDIME <=1 SENSITIVE Sensitive     CEFTRIAXONE <=1 SENSITIVE Sensitive  CIPROFLOXACIN <=0.25 SENSITIVE Sensitive     GENTAMICIN <=1 SENSITIVE Sensitive     IMIPENEM <=0.25 SENSITIVE Sensitive     TRIMETH/SULFA <=20 SENSITIVE Sensitive     PIP/TAZO <=4 SENSITIVE Sensitive     * ENTEROBACTER CLOACAE  Blood Culture ID Panel (Reflexed)     Status: Abnormal   Collection Time: 03/10/20  7:27 PM  Result Value Ref Range Status   Enterococcus species NOT DETECTED NOT DETECTED Final   Listeria monocytogenes NOT DETECTED NOT DETECTED Final   Staphylococcus species NOT DETECTED NOT DETECTED Final   Staphylococcus aureus (BCID) NOT DETECTED NOT DETECTED Final   Streptococcus species NOT DETECTED NOT DETECTED Final   Streptococcus agalactiae NOT DETECTED NOT DETECTED Final   Streptococcus pneumoniae NOT DETECTED NOT DETECTED Final   Streptococcus pyogenes NOT DETECTED NOT DETECTED Final   Acinetobacter baumannii NOT DETECTED NOT DETECTED Final   Enterobacteriaceae species DETECTED (A) NOT DETECTED Final    Comment: Enterobacteriaceae represent a large family of gram-negative bacteria, not a single organism. CRITICAL RESULT CALLED TO, READ BACK BY AND VERIFIED WITH: Andres Shad PharmD 11:10 03/11/20 ([email or web address])    Enterobacter cloacae complex DETECTED (A) NOT DETECTED Final    Comment: CRITICAL RESULT CALLED TO, READ BACK BY AND VERIFIED WITH: Andres Shad PharmD 11:10 03/11/20 ([email or web address])    Escherichia coli NOT DETECTED NOT DETECTED Final   Klebsiella oxytoca NOT DETECTED NOT DETECTED Final   Klebsiella pneumoniae NOT DETECTED NOT DETECTED Final   Proteus species NOT DETECTED NOT DETECTED Final   Serratia marcescens NOT DETECTED NOT DETECTED Final   Carbapenem resistance NOT DETECTED NOT  DETECTED Final   Haemophilus influenzae NOT DETECTED NOT DETECTED Final   Neisseria meningitidis NOT DETECTED NOT DETECTED Final   Pseudomonas aeruginosa NOT DETECTED NOT DETECTED Final   Candida albicans NOT DETECTED NOT DETECTED Final   Candida glabrata NOT DETECTED NOT DETECTED Final   Candida krusei NOT DETECTED NOT DETECTED Final   Candida parapsilosis NOT DETECTED NOT DETECTED Final   Candida tropicalis NOT DETECTED NOT DETECTED Final    Comment: Performed at [facility], [place] [address], [place], [place] [address]  Culture, blood (routine x 2)     Status: None (Preliminary result)   Collection Time: 03/13/20  4:27 AM   Specimen: BLOOD  Result Value Ref Range Status   Specimen Description BLOOD SITE NOT SPECIFIED  Final   Special Requests   Final    BOTTLES DRAWN AEROBIC AND ANAEROBIC Blood Culture adequate volume   Culture   Final    NO GROWTH 3 DAYS Performed at [facility], [address] [place], [place] [address]    Report Status PENDING  Incomplete  Culture, blood (routine x 2)     Status: None (Preliminary result)   Collection Time: 03/13/20  4:41 AM   Specimen: BLOOD  Result Value Ref Range Status   Specimen Description BLOOD SITE NOT SPECIFIED  Final   Special Requests   Final    BOTTLES DRAWN AEROBIC AND ANAEROBIC Blood Culture adequate volume   Culture   Final    NO GROWTH 3 DAYS Performed at [facility], [address] [place], [place] [address]    Report Status PENDING  Incomplete     Terri Piedra, [place] for Infectious Disease [facility]  03/16/2020  10:12 AM

## 2020-03-16 NOTE — Progress Notes (Signed)
Mobility Specialist: Progress Note    03/16/20 1326  Mobility  Activity Ambulated in hall  Level of Assistance Independent  Assistive Device None  Distance Ambulated (ft) 1500 ft  Mobility Response Tolerated well  Mobility performed by Mobility specialist  $Mobility charge 1 Mobility   Pre-Mobility: 83 HR, 116/105 BP Post-Mobility: 91 HR, 125/73 BP  Development worker, international aid

## 2020-03-16 NOTE — Progress Notes (Addendum)
      [address]       [place] [address]             [phone]      10 Days Post-Op Procedure(s) (LRB): MINIMALLY INVASIVE MITRAL VALVE (MV) REPLACEMENT using Carbomedics Optiform 33 MM Mitral Bioprosthetic Valve. (Right) TRANSESOPHAGEAL ECHOCARDIOGRAM (TEE) (N/A)   Subjective:  Melvin Burns states he feels well.  He really wants to go home.  He denies pain in his right hand.  Objective: Vital signs in last 24 hours: Temp:  [98 F (36.7 C)-99.2 F (37.3 C)] 98.4 F (36.9 C) (06/07 0547) Pulse Rate:  [82-97] 93 (06/07 0547) Cardiac Rhythm: Heart block (06/07 0300) Resp:  [18-20] 18 (06/07 0547) BP: (105-126)/(72-90) 126/80 (06/07 0547) SpO2:  [94 %-100 %] 97 % (06/07 0547) Weight:  [75.3 kg] 75.3 kg (06/07 0547)  Intake/Output from previous day: 06/06 0701 - 06/07 0700 In: 120 [P.O.:120] Out: -   General appearance: alert, cooperative and no distress Heart: regular rate and rhythm Lungs: clear to auscultation bilaterally Abdomen: soft, non-tender; bowel sounds normal; no masses,  no organomegaly Extremities: edema none appreicated Wound: clean and dry  Lab Results: Recent Labs    03/15/20 0411 03/16/20 0233  WBC 21.9* 28.7*  HGB 9.6* 9.4*  HCT 30.1* 30.2*  PLT 425* 437*   BMET:  Recent Labs    03/14/20 0526  NA 135  K 4.2  CL 100  CO2 26  GLUCOSE 116*  BUN 12  CREATININE 0.90  CALCIUM 9.4    PT/INR:  Recent Labs    03/16/20 0233  LABPROT 28.4*  INR 2.8*   ABG    Component Value Date/Time   PHART 7.329 (L) 03/06/2020 1537   HCO3 24.7 03/06/2020 1537   TCO2 26 03/06/2020 1537   ACIDBASEDEF 2.0 03/06/2020 1537   O2SAT 98.0 03/06/2020 1537   CBG (last 3)  Recent Labs    03/13/20 2114 03/14/20 0617 03/14/20 1104  GLUCAP 121* 114* 125*    Assessment/Plan: S/P Procedure(s) (LRB): MINIMALLY INVASIVE MITRAL VALVE (MV) REPLACEMENT using Carbomedics Optiform 33 MM Mitral Bioprosthetic Valve. (Right) TRANSESOPHAGEAL  ECHOCARDIOGRAM (TEE) (N/A)  1. 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 INR 2.8, continue coumadin at current regimen of 5 mg daily 3. Pulm- no acute issues, off oxygen 4. Renal- creatinine, lytes remains stable, weight is stable 5. ID-bacteremia, on Cipro per ID recommendations, WBC increased from 21K to 28K.. will discuss with Dr. [name] 6. Dispo- patient hemodynamically stable, increase in leukocytes today from 21-28K, patient in on Cipro BID per ID recommendations, will discuss discharge with Dr. [name], as patient wishes to go home, just want to ensure he doesn't want patient to say another day to make sure leukocytes dont increase further    LOS: 10 days    Lowella Dandy, PA-C 03/16/2020   I have seen and examined the patient and agree with the assessment and plan as outlined.  Although Mr. Berisha looks very good, I am concerned by the rising WBC and very worried about stopping intravenous antibiotics given the recently placed prosthetic heart valve.  Will hold patient discharge and re-consult the Infectious Disease team.  Purcell Nails, MD 03/16/2020 8:42 AM

## 2020-03-16 NOTE — Progress Notes (Signed)
Spoke to primary RN [facility] regarding PICC placement   will not be done today.

## 2020-03-16 NOTE — Progress Notes (Signed)
PHARMACY CONSULT NOTE FOR:  OUTPATIENT  PARENTERAL ANTIBIOTIC THERAPY (OPAT)  Indication: Gram-negative bacteremia  Regimen: cefepime 2g IV q8h  End date: 03/27/2020  IV antibiotic discharge orders are pended. To discharging provider:  please sign these orders via discharge navigator,  Select New Orders & click on the button choice - Manage This Unsigned Work.     Thank you for allowing pharmacy to be a part of this patient's care.  Jeannetta Nap 03/16/2020, 9:54 AM

## 2020-03-16 NOTE — Progress Notes (Addendum)
CARDIAC REHAB PHASE I   PRE:  Rate/Rhythm: 85 ?1st deg HB  MODE:  Ambulation: >1000 ft   POST:  Rate/Rhythm: 91 ?1st deg HB  BP:  Sitting: 133/101    SaO2: 98 RA  Pt ambulated >1043ft to [employer]. Pt denies CP or SOB. Anxious to go home but understands reason for staying. Pt c/o lack of sleep, RN made aware. Encouraged continued ambulation, rest, and IS use. Will continue to follow.  [id number] Reynold Bowen, RN BSN 03/16/2020 9:21 AM

## 2020-03-16 NOTE — Progress Notes (Signed)
Pt ambulated x 1000 feet independently in halls with no distress

## 2020-03-16 NOTE — Progress Notes (Signed)
PT hr 90-120's during the night afib, sustaining in 100's pt converted to sinus with a first degree heart block  At about 0220

## 2020-03-16 NOTE — Progress Notes (Signed)
Mobility Specialist: Progress Note   03/16/20 1535  Mobility  Activity Refused mobility  Mobility performed by Mobility specialist   Pt refused second walk. Pt wants to try to get some sleep.   [facility] Pauline Pegues Mobility Specialist

## 2020-03-17 LAB — CBC
HCT: 28.4 % — ABNORMAL LOW (ref 39.0–52.0)
HCT: 30 % — ABNORMAL LOW (ref 39.0–52.0)
Hemoglobin: 9 g/dL — ABNORMAL LOW (ref 13.0–17.0)
Hemoglobin: 9.4 g/dL — ABNORMAL LOW (ref 13.0–17.0)
MCH: 27.6 pg (ref 26.0–34.0)
MCH: 27.5 pg (ref 26.0–34.0)
MCHC: 31.7 g/dL (ref 30.0–36.0)
MCHC: 31.3 g/dL (ref 30.0–36.0)
MCV: 87.1 fL (ref 80.0–100.0)
MCV: 87.7 fL (ref 80.0–100.0)
Platelets: 470 10*3/uL — ABNORMAL HIGH (ref 150–400)
Platelets: 507 10*3/uL — ABNORMAL HIGH (ref 150–400)
RBC: 3.26 MIL/uL — ABNORMAL LOW (ref 4.22–5.81)
RBC: 3.42 MIL/uL — ABNORMAL LOW (ref 4.22–5.81)
RDW: 13.2 % (ref 11.5–15.5)
RDW: 13.2 % (ref 11.5–15.5)
WBC: 24.8 10*3/uL — ABNORMAL HIGH (ref 4.0–10.5)
WBC: 22.6 10*3/uL — ABNORMAL HIGH (ref 4.0–10.5)
nRBC: 0 % (ref 0.0–0.2)
nRBC: 0.1 % (ref 0.0–0.2)

## 2020-03-17 LAB — PROTIME-INR
INR: 2.2 — ABNORMAL HIGH (ref 0.8–1.2)
Prothrombin Time: 23.7 seconds — ABNORMAL HIGH (ref 11.4–15.2)

## 2020-03-17 LAB — GLUCOSE, CAPILLARY: Glucose-Capillary: 83 mg/dL (ref 70–99)

## 2020-03-17 MED ORDER — OXYCODONE HCL 5 MG PO TABS: 5.0000 mg | ORAL_TABLET | ORAL | 0 refills | Status: DC | PRN

## 2020-03-17 MED ORDER — FUROSEMIDE 40 MG PO TABS: 40.0000 mg | ORAL_TABLET | Freq: Every day | ORAL | 0 refills | Status: DC

## 2020-03-17 MED ORDER — POTASSIUM CHLORIDE CRYS ER 20 MEQ PO TBCR: 20.0000 meq | EXTENDED_RELEASE_TABLET | Freq: Every day | ORAL | 0 refills | Status: DC

## 2020-03-17 MED ORDER — METOPROLOL TARTRATE 25 MG PO TABS: 25.0000 mg | ORAL_TABLET | Freq: Two times a day (BID) | ORAL | 3 refills | Status: DC

## 2020-03-17 MED ORDER — WARFARIN SODIUM 5 MG PO TABS: 5.0000 mg | ORAL_TABLET | Freq: Every day | ORAL | 3 refills | Status: DC

## 2020-03-17 MED ORDER — AMIODARONE HCL 200 MG PO TABS: 200.0000 mg | ORAL_TABLET | Freq: Two times a day (BID) | ORAL | 1 refills | Status: DC

## 2020-03-17 NOTE — Progress Notes (Addendum)
      [address]       [place] [address]             [phone]      11 Days Post-Op Procedure(s) (LRB): MINIMALLY INVASIVE MITRAL VALVE (MV) REPLACEMENT using Carbomedics Optiform 33 MM Mitral Bioprosthetic Valve. (Right) TRANSESOPHAGEAL ECHOCARDIOGRAM (TEE) (N/A)   Subjective:  No new complaints.  Patient is hoping to go home today.  Objective: Vital signs in last 24 hours: Temp:  [98.3 F (36.8 C)-99 F (37.2 C)] 98.3 F (36.8 C) (06/08 0258) Pulse Rate:  [61-93] 61 (06/08 0258) Cardiac Rhythm: Heart block (06/07 1900) Resp:  [15-24] 20 (06/08 0258) BP: (110-127)/(72-85) 127/85 (06/08 0258) SpO2:  [94 %-100 %] 94 % (06/08 0258) Weight:  [75.6 kg] 75.6 kg (06/08 0258)  Intake/Output from previous day: 06/07 0701 - 06/08 0700 In: 580 [P.O.:480; IV Piggyback:100] Out: -   General appearance: alert, cooperative and no distress Heart: regular rate and rhythm Lungs: clear to auscultation bilaterally Abdomen: soft, non-tender; bowel sounds normal; no masses,  no organomegaly Extremities: extremities normal, atraumatic, no cyanosis or edema Wound: clean and dry  Lab Results: Recent Labs    03/16/20 0233 03/17/20 0403  WBC 28.7* 24.8*  HGB 9.4* 9.0*  HCT 30.2* 28.4*  PLT 437* 470*   BMET: No results for input(s): NA, K, CL, CO2, GLUCOSE, BUN, CREATININE, CALCIUM in the last 72 hours.  PT/INR:  Recent Labs    03/17/20 0403  LABPROT 23.7*  INR 2.2*   ABG    Component Value Date/Time   PHART 7.329 (L) 03/06/2020 1537   HCO3 24.7 03/06/2020 1537   TCO2 26 03/06/2020 1537   ACIDBASEDEF 2.0 03/06/2020 1537   O2SAT 98.0 03/06/2020 1537   CBG (last 3)  Recent Labs    03/14/20 1104  GLUCAP 125*    Assessment/Plan: S/P Procedure(s) (LRB): MINIMALLY INVASIVE MITRAL VALVE (MV) REPLACEMENT using Carbomedics Optiform 33 MM Mitral Bioprosthetic Valve. (Right) TRANSESOPHAGEAL ECHOCARDIOGRAM (TEE) (N/A)  1. CV- PAF, currently stable in NSR  with 1st degree block- will decrease Amiodarone to 200 mg BID today, Norvasc, Lopressor 2. INR 2.2, continue coumadin at 5 mg daily, will plan for PT/INR check later this week 3. Pulm- no acute issues, off oxygen 4. Renal- creatinine has been stable, weight has been stable, continue diuretics for now 5. ID- remains afebrile, leukocytosis, down to 24.8 this morning, ID has started Cefipime to continue at discharge, Picc line is in place 6. Dispo- patient stable, home IV ABX have been arranged, if okay with Dr. [name] will d/c home today   LOS: 11 days    Lowella Dandy, PA-C  03/17/2020   I have seen and examined the patient and agree with the assessment and plan as outlined.  D/C home today w/ home IV Cefipime.  Recheck WBC when 2 weeks of IV antibiotics have been completed.  Purcell Nails, MD 03/17/2020 10:33 AM

## 2020-03-17 NOTE — TOC Progression Note (Signed)
Transition of Care (TOC) - Progression Note  Donn Pierini RN, BSN Transitions of Care Unit 4E- RN Case Manager [phone]   Patient Details  Name: Melvin Burns MRN: [id number] Date of Birth: Feb 26, 1966  Transition of Care [facility]) CM/SW Contact  Zenda Alpers, Lenn Sink, RN Phone Number: 03/17/2020, 5:25 PM  Clinical Narrative:    Notified by Elita Quick with [facility] this AM that they were still trying to verify patient's insurance- upon further review pt does not have coverage for [facility] services. Have been working with Elita Quick throughout day to insure pt can afford home IV abx, per Pam patient is agreeable to out of pocket cost for IV abx- but still need HH PICC line care and lab draws. Have spoken with [facility] leadership regarding barriers to transition home, have reached out to [facility]- to see if pt would be eligible for charity services. Have also reached out to [facility] in Short stay to see if pt could come there for needed services- question was brought up about pt needing pre-cert for services. Order form was faxed to [facility] in case it is needed.  Per [facility] leadership pt has been approved for LOG services - 3 RN visits for PICC line care and lab draw needs- spoke with Tiffany with [facility]- however they are unable to take the referral under charity or LOG at this time.  Spoke with Vickey Huger at [facility]- they are agreeable to assist for [facility] needs under LOG- however they are stating that they are unable to do lab draws as they can not take the samples to have them processed without a "payor source" - working on these with leadership to problem solve the lab draw barrier.  Call made to [facility]- PA for TCTS- to inform pt would need to stay overnight while barriers are resolved for his home IV abx. Needs. Bedside RN also made aware.   TOC will f/u in am with [facility] and leadership   Expected Discharge Plan: Home w Home Health Services Barriers to Discharge: Other (comment)(PICC line placement)  Expected  Discharge Plan and Services Expected Discharge Plan: Home w Home Health Services   Discharge Planning Services: CM Consult Post Acute Care Choice: Home Health Living arrangements for the past 2 months: Apartment Expected Discharge Date: 03/17/20               DME Arranged: N/A DME Agency: NA       HH Arranged: RN, IV Antibiotics HH Agency: [facility] Date HH Agency Contacted: 03/16/20 Time HH Agency Contacted: 1200 Representative spoke with at [facility] Agency: Pam   Social Determinants of Health (SDOH) Interventions    Readmission Risk Interventions No flowsheet data found.

## 2020-03-17 NOTE — Progress Notes (Signed)
Mobility Specialist: Progress Note    03/17/20 1543  Mobility  Activity Ambulated in hall  Level of Assistance Independent  Assistive Device None  Distance Ambulated (ft) 1500 ft  Mobility Response Tolerated well  Mobility performed by Mobility specialist  $Mobility charge 1 Mobility   Pt seen ambulating in hallway independently.   [facility] Janavia Rottman Mobility Specialist

## 2020-03-17 NOTE — Anesthesia Postprocedure Evaluation (Signed)
Anesthesia Post Note  Patient: Melvin Burns. Bow  Procedure(s) Performed: MINIMALLY INVASIVE MITRAL VALVE (MV) REPLACEMENT using Carbomedics Optiform 33 MM Mitral Bioprosthetic Valve. (Right Chest) TRANSESOPHAGEAL ECHOCARDIOGRAM (TEE) (N/A )     Patient location during evaluation: Nursing Unit Anesthesia Type: General Level of consciousness: awake and alert Pain management: pain level controlled Vital Signs Assessment: post-procedure vital signs reviewed and stable Respiratory status: spontaneous breathing, nonlabored ventilation and respiratory function stable Cardiovascular status: blood pressure returned to baseline Anesthetic complications: no    Last Vitals:  Vitals:   03/17/20 1142 03/17/20 1530  BP: 111/76 125/83  Pulse: 71 71  Resp: 16 15  Temp: 36.6 C 37 C  SpO2: 96% 96%    Last Pain:  Vitals:   03/17/20 1530  TempSrc: Oral  PainSc:                  Lamark Schue COKER

## 2020-03-17 NOTE — Progress Notes (Signed)
CARDIAC REHAB PHASE I   Reinforced d/c ed with pt. Pt educated on importance of site care and monitoring incision daily. Encouraged continued IS use and ambulation. Reviewed restrictions and exercise guidelines. Pt referred to CRP [facility] [place].  [id number] Reynold Bowen, RN BSN 03/17/2020 11:09 AM

## 2020-03-18 LAB — PROTIME-INR
INR: 2.2 — ABNORMAL HIGH (ref 0.8–1.2)
Prothrombin Time: 23.6 seconds — ABNORMAL HIGH (ref 11.4–15.2)

## 2020-03-18 LAB — CULTURE, BLOOD (ROUTINE X 2)
Culture: NO GROWTH
Culture: NO GROWTH
Special Requests: ADEQUATE
Special Requests: ADEQUATE

## 2020-03-18 MED ORDER — HEPARIN SOD (PORK) LOCK FLUSH 100 UNIT/ML IV SOLN
250.0000 [IU] | INTRAVENOUS | Status: AC | PRN
  Administered 2020-03-18: 250 [IU]
  Filled 2020-03-18: qty 2.5

## 2020-03-18 NOTE — Progress Notes (Signed)
Mobility Specialist: Progress Note    03/18/20 1332  Mobility  Activity Ambulated in hall  Level of Assistance Independent  Assistive Device None  Distance Ambulated (ft) 1880 ft  Mobility Response Tolerated well  Mobility performed by Mobility specialist  Bed Position Chair  $Mobility charge 1 Mobility   Pre-Mobility: 79 HR Post-Mobility: 80 HR  Development worker, international aid

## 2020-03-18 NOTE — TOC Transition Note (Addendum)
Transition of Care (TOC) - CM/SW Discharge Note   Patient Details  Name: Melvin Burns. Schoene MRN: [id number] Date of Birth: 09/21/1966  Transition [facility]) CM/SW Contact:  Gala Lewandowsky, RN Phone Number: 03/18/2020, 1:10 PM   Clinical Narrative: Case Manager was able to secure the letter of guarantee (LOG) for RN services for four visits via [facility]. Case Manager spoke with Vickey Huger and she is agreeable to go forward with LOG and patient will have first visit from [place] on 03-19-20. Anastasia Fiedler will provide PICC line care and lab draws. Anastasia Fiedler will go through [employer] for lab draws and bill the patient's insurance; however, it will probably be an out of pocket fee for the patient. Staff RN to provide the patient with his 2:00 pm dose and patient is independent to provide the night dosage. Case Manager checking to see when IV antibiotics will be delivered to the home. Case Manager will continue to follow up with patient and staff RN to make them aware when patient is complete for transition home.    1426 03-18-20 Patient can transition home after dose of IV antibiotics. IV antibiotics to be delivered to the patient's home this evening around 7-8 pm for his night dosage. Patient has transportation home. No further needs from Case Manager at this time.    Final next level of care: Home w Home Health Services Barriers to Discharge: Barriers Resolved   Patient Goals and CMS Choice Patient states their goals for this hospitalization and ongoing recovery are:: to get home and get better CMS Medicare.gov Compare Post Acute Care list provided to:: Patient Choice offered to / list presented to : Patient   Discharge Plan and Services   Discharge Planning Services: CM Consult Post Acute Care Choice: Home Health          DME Arranged: N/A DME Agency: NA       HH Arranged: RN, IV Antibiotics HH Agency: [facility] Date HH Agency Contacted: 03/16/20 Time HH Agency Contacted:  1200 Representative spoke with at [facility] Agency: Pam   Readmission Risk Interventions No flowsheet data found.

## 2020-03-18 NOTE — Progress Notes (Signed)
D/C instructions given to patient. Medication and wound care instructions given. All questions answered. PICC line heparinized by IV team. Pt to be escorted home by friend.  Versie Starks, RN

## 2020-03-18 NOTE — Progress Notes (Addendum)
      [address]       [place] [address]             [phone]      12 Days Post-Op Procedure(s) (LRB): MINIMALLY INVASIVE MITRAL VALVE (MV) REPLACEMENT using Carbomedics Optiform 33 MM Mitral Bioprosthetic Valve. (Right) TRANSESOPHAGEAL ECHOCARDIOGRAM (TEE) (N/A)   Subjective:  No new complaints.  Wants to go home.  Objective: Vital signs in last 24 hours: Temp:  [97.7 F (36.5 C)-98.6 F (37 C)] 97.7 F (36.5 C) (06/09 0455) Pulse Rate:  [71-73] 71 (06/09 0032) Cardiac Rhythm: Heart block (06/08 1900) Resp:  [14-18] 14 (06/09 0455) BP: (108-131)/(71-93) 120/73 (06/09 0455) SpO2:  [96 %-100 %] 100 % (06/09 0455) Weight:  [75.3 kg] 75.3 kg (06/09 0505)  Intake/Output from previous day: 06/08 0701 - 06/09 0700 In: 800 [P.O.:600; IV Piggyback:200] Out: -   General appearance: alert, cooperative and no distress Heart: regular rate and rhythm Lungs: clear to auscultation bilaterally Abdomen: soft, non-tender; bowel sounds normal; no masses,  no organomegaly Extremities: edema trace Wound: clean and dry  Lab Results: Recent Labs    03/17/20 0403 03/17/20 1108  WBC 24.8* 22.6*  HGB 9.0* 9.4*  HCT 28.4* 30.0*  PLT 470* 507*   BMET: No results for input(s): NA, K, CL, CO2, GLUCOSE, BUN, CREATININE, CALCIUM in the last 72 hours.  PT/INR:  Recent Labs    03/18/20 0500  LABPROT 23.6*  INR 2.2*   ABG    Component Value Date/Time   PHART 7.329 (L) 03/06/2020 1537   HCO3 24.7 03/06/2020 1537   TCO2 26 03/06/2020 1537   ACIDBASEDEF 2.0 03/06/2020 1537   O2SAT 98.0 03/06/2020 1537   CBG (last 3)  Recent Labs    03/17/20 1138  GLUCAP 83    Assessment/Plan: S/P Procedure(s) (LRB): MINIMALLY INVASIVE MITRAL VALVE (MV) REPLACEMENT using Carbomedics Optiform 33 MM Mitral Bioprosthetic Valve. (Right) TRANSESOPHAGEAL ECHOCARDIOGRAM (TEE) (N/A)  1. CV- hemodynamically stable continue Amiodarone, Norvasc, Lopressor 2. INR 2.2, continue  coumadin 3. ID- remains afebrile, on IV ABX Cefipime.Marland Kitchen awaiting H/H services to be arranged.. patient's insurance wont cover home services, trying to find agency to accept charity. 4. Dispo- patient stable, ready for d/c once, patient's home health arrangements can be made due to insurance issues   LOS: 12 days    Lowella Dandy, PA-C 03/18/2020  I have seen and examined the patient and agree with the assessment and plan as outlined.  Awaiting d/c once home health arrangements in place.  Purcell Nails, MD 03/18/2020 9:19 AM

## 2020-03-20 ENCOUNTER — Encounter: Payer: Self-pay | Admitting: Pharmacist

## 2020-03-20 NOTE — Telephone Encounter (Signed)
This encounter was created in error - please disregard.

## 2020-03-25 ENCOUNTER — Ambulatory Visit (INDEPENDENT_AMBULATORY_CARE_PROVIDER_SITE_OTHER): Payer: No Typology Code available for payment source | Admitting: Pharmacist

## 2020-03-25 ENCOUNTER — Other Ambulatory Visit: Payer: Self-pay

## 2020-03-25 DIAGNOSIS — Z954 Presence of other heart-valve replacement: Secondary | ICD-10-CM | POA: Diagnosis not present

## 2020-03-25 DIAGNOSIS — Z7901 Long term (current) use of anticoagulants: Secondary | ICD-10-CM | POA: Diagnosis not present

## 2020-03-25 LAB — POCT INR: INR: 3.8 — AB (ref 2.0–3.0)

## 2020-03-25 NOTE — Progress Notes (Signed)
Confusion regarding which type of valve patient had placed: mechanical mitral valve vs bioprosthetic mitral valve.  In surgery notes, patient has mechanical valve.  INR goal should be 2.5-3.5.  Spoke with patient and explained.  Patient voiced understanding.

## 2020-03-25 NOTE — Patient Instructions (Addendum)
Description   Hold dose of warfarin tomorrow.  Then start taking warfarin 5 mg daily.  Recheck in 1 week.

## 2020-03-27 ENCOUNTER — Ambulatory Visit (INDEPENDENT_AMBULATORY_CARE_PROVIDER_SITE_OTHER): Payer: No Typology Code available for payment source | Admitting: Family

## 2020-03-27 ENCOUNTER — Telehealth: Payer: Self-pay

## 2020-03-27 ENCOUNTER — Other Ambulatory Visit: Payer: Self-pay

## 2020-03-27 ENCOUNTER — Encounter: Payer: Self-pay | Admitting: Family

## 2020-03-27 DIAGNOSIS — T827XXD Infection and inflammatory reaction due to other cardiac and vascular devices, implants and grafts, subsequent encounter: Secondary | ICD-10-CM | POA: Diagnosis not present

## 2020-03-27 MED ORDER — CIPROFLOXACIN HCL 500 MG PO TABS: 500.0000 mg | ORAL_TABLET | Freq: Two times a day (BID) | ORAL | 0 refills | Status: DC

## 2020-03-27 NOTE — Patient Instructions (Signed)
Nice to see you.  We will take your PICC line out today.  Start taking Ciprofloxacin twice daily for 14 days.   Plan for follow up in 1 month or sooner if needed.   Please be sure to inform you Coumadin provider you are starting Ciprofloxacin for 2 weeks.   Have a great day and stay safe!

## 2020-03-27 NOTE — Telephone Encounter (Signed)
Pharmacy called office today regarding drug- drug interaction between Warfarin and Cipro. Pharmacy will need to know if it is okay to fill or if alternative medication can be prescribed. Lorenso Courier, [place]

## 2020-03-27 NOTE — Telephone Encounter (Signed)
Coumadin providers made aware of ciprofloxacin addition and will adjust accordingly.

## 2020-03-27 NOTE — Assessment & Plan Note (Signed)
Melvin Burns is a 54 year old male status post mitral valve replacement with mechanical valve with hospitalization complicated by Enterobacter bacteremia from a peripheral site now status post 2 weeks of treatment with cefepime and significant improvements.  Has a mild increase in white blood cell count on most recent blood work and inflammatory markers remain elevated.  Will discontinue PICC line and start ciprofloxacin for 2 additional weeks.  Plan for follow-up in 1 month or sooner if needed.

## 2020-03-27 NOTE — Progress Notes (Signed)
Per verbal order from Marcos Eke, NP, 47 cm Single Lumen Peripherally Inserted Central Catheter removed from left basilic, tip intact. No sutures present. RN confirmed length per chart. Dressing was dingy, reinforced with tape, but dry. Petroleum dressing applied, reinforced with light ace wrap. Pt advised no heavy lifting with this arm, leave dressing for 24 hours and call the office or seek emergent care if dressing becomes soaked with blood or swelling or sharp pain presents. Patient verbalized understanding and agreement.  Patient's questions answered to their satisfaction. Patient tolerated procedure well, RN walked patient to check out. Pharmacy notified Nurse, adult, [facility]). Patient will be on 2 week course of oral cipro, notified coumadin clinic and CT surgery. Andree Coss, RN

## 2020-03-27 NOTE — Progress Notes (Signed)
Subjective:    Patient ID: Melvin Burns, male    DOB: [birth year], 54 y.o.   MRN: [id number]  Chief Complaint  Patient presents with  . Follow-up    No questions or concerns    HPI:  Melvin Burns is a 54 y.o. male with previous medical history of hypertension, mitral valve stenosis status post mitral valve replacement with mechanical valve and tobacco use who was recently admitted to the hospital for mitral valve replacement with mechanical valve complicated by development of Enterobacter cloacae bacteremia from a peripherally inserted IV site.  Treated with cefepime initially with recommendations for ciprofloxacin for 2 weeks, however developed increasing leukocytosis and was changed back to cefepime with goal of outpatient therapy of 2 weeks.  Here today for hospitalization follow-up.  [name] has been doing well since leaving the hospital.  He continues to take his cefepime 3 times daily as prescribed with no adverse side effects or missed doses since leaving the hospital.  His hand is significantly improved with decreased edema and pain.  Inflammatory markers elevated with mild elevation in white blood cell count although improved since leaving the hospital.  Denies systemic symptoms of fevers, chills, or sweats.  PICC line remains in place and functioning appropriately with no complications.   No Known Allergies    Outpatient Medications Prior to Visit  Medication Sig Dispense Refill  . amiodarone (PACERONE) 200 MG tablet Take 1 tablet (200 mg total) by mouth 2 (two) times daily. X 7 days, then decrease to 200 mg ( 1 tablet) daily 60 tablet 1  . aspirin EC 81 MG tablet Take 1 tablet (81 mg total) by mouth daily.    . furosemide (LASIX) 40 MG tablet Take 1 tablet (40 mg total) by mouth daily. 7 tablet 0  . lisinopril (ZESTRIL) 10 MG tablet TAKE 1 TABLET(10 MG) BY MOUTH DAILY (Patient taking differently: Take 10 mg by mouth daily. ) 90 tablet 1  . metoprolol tartrate  (LOPRESSOR) 25 MG tablet Take 1 tablet (25 mg total) by mouth 2 (two) times daily. 60 tablet 3  . potassium chloride SA (KLOR-CON) 20 MEQ tablet Take 1 tablet (20 mEq total) by mouth daily. 7 tablet 0  . warfarin (COUMADIN) 5 MG tablet Take 1 tablet (5 mg total) by mouth daily at 4 PM. 30 tablet 3  . ceFEPime (MAXIPIME) IVPB Inject 2 g into the vein every 8 (eight) hours for 11 days. Indication:  Gram-negative bacteremia  First Dose: No Last Day of Therapy:  03/27/2020 Labs - Once weekly:  CBC/D and BMP, Labs - Every other week:  ESR and CRP Method of administration: IV Push Method of administration may be changed at the discretion of home infusion pharmacist based upon assessment of the patient and/or caregiver's ability to self-administer the medication ordered. 33 Units 0  . oxyCODONE (OXY IR/ROXICODONE) 5 MG immediate release tablet Take 1-2 tablets (5-10 mg total) by mouth every 3 (three) hours as needed for severe pain. (Patient not taking: Reported on 03/27/2020) 30 tablet 0   No facility-administered medications prior to visit.     Past Medical History:  Diagnosis Date  . Hypertension   . Incidental pulmonary nodule 01/08/2020   Multiple in right lung - need f/u CT 1 year due to h/o smoking  . Mild aortic stenosis   . Mitral valve stenosis, severe   . Moderate mitral regurgitation   . Pneumonia    x 1  . Rheumatic mitral stenosis with  regurgitation    Echo 08/08/16:  Severe mitral stenosis (mean gradient 17 mmHg).  Moderate mitral regurgitation.  . 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 Wears glasses   . Wears partial dentures    upper      Past Surgical History:  Procedure Laterality Date  . BILATERAL KNEE ARTHROSCOPY    . BUBBLE STUDY  12/16/2019   Procedure: BUBBLE STUDY;  Surgeon: Elouise Munroe, MD;  Location: [place];   Service: Cardiology;;  . MITRAL VALVE REPLACEMENT Right 03/06/2020   Procedure: MINIMALLY INVASIVE MITRAL VALVE (MV) REPLACEMENT using Carbomedics Optiform 34 MM Mitral Bioprosthetic Valve.;  Surgeon: Rexene Alberts, MD;  Location: [place];  Service: Open Heart Surgery;  Laterality: Right;  . MULTIPLE EXTRACTIONS WITH ALVEOLOPLASTY N/A 12/31/2019   Procedure: Extraction of tooth #'s 16-17, 20-23, and 28-30 with alveoloplasty, Bilateral mandibular tori reductions, and lateral exostoses reductions.;  Surgeon: [name], DDS;  Location: [facility];  Service: Oral Surgery;  Laterality: N/A;  . RIGHT/LEFT HEART CATH AND CORONARY ANGIOGRAPHY N/A 12/16/2019   Procedure: RIGHT/LEFT HEART CATH AND CORONARY ANGIOGRAPHY;  Surgeon: Nelva Bush, MD;  Location: [place] CV [facility];  Service: Cardiovascular;  Laterality: N/A;  . TEE WITHOUT CARDIOVERSION N/A 12/16/2019   Procedure: TRANSESOPHAGEAL ECHOCARDIOGRAM (TEE);  Surgeon: Elouise Munroe, MD;  Location: [place];  Service: Cardiology;  Laterality: N/A;  . TEE WITHOUT CARDIOVERSION N/A 03/06/2020   Procedure: TRANSESOPHAGEAL ECHOCARDIOGRAM (TEE);  Surgeon: Rexene Alberts, MD;  Location: [place];  Service: Open Heart Surgery;  Laterality: N/A;      Family History  Problem Relation Age of Onset  . Hypertension Mother   . Hypertension Father   . Stroke Father       Social History   Socioeconomic History  . Marital status: Single    Spouse name: Not on file  . Number of children: Not on file  . Years of education: Not on file  . Highest education level: Not on file  Occupational History  . Occupation: [employer] manager  Tobacco Use  . Smoking status: Former Smoker    Packs/day: 1.00    Years: 35.00    Pack years: 35.00    Types: Cigarettes    Quit date: 12/09/2019    Years since quitting: 0.2  . Smokeless tobacco: Never Used  Vaping Use  . Vaping Use: Never used  Substance and Sexual Activity  . Alcohol use: No  . Drug use:  No  . Sexual activity: Yes    Partners: Female  Other Topics Concern  . Not on file  Social History Narrative   Pt is married.  He is a former Theme park manager.             Social Determinants of Health   Financial Resource Strain:   . Difficulty of Paying Living Expenses:   Food Insecurity:   . Worried About Charity fundraiser in the Last Year:   . Arboriculturist in the Last Year:   Transportation Needs:   . Film/video editor (Medical):   Marland Kitchen Lack of Transportation (Non-Medical):   Physical Activity:   . Days of Exercise per Week:   . Minutes of Exercise per Session:   Stress:   . Feeling of Stress :   Social Connections:   . Frequency of Communication with Friends and Family:   .  Frequency of Social Gatherings with Friends and Family:   . Attends Religious Services:   . Active Member of Clubs or Organizations:   . Attends Archivist Meetings:   Marland Kitchen Marital Status:   Intimate Partner Violence:   . Fear of Current or Ex-Partner:   . Emotionally Abused:   Marland Kitchen Physically Abused:   . Sexually Abused:       Review of Systems     Objective:    BP (!) 148/90   Pulse 69   Temp 97.6 F (36.4 C)   Wt 172 lb 3.2 oz (78.1 kg)   SpO2 100%   BMI 25.43 kg/m  Nursing note and vital signs reviewed.  Physical Exam Constitutional:      General: He is not in acute distress.    Appearance: He is well-developed.     Comments: Seated in the chair; pleasant  Cardiovascular:     Rate and Rhythm: Normal rate and regular rhythm.     Heart sounds: Normal heart sounds.     Comments: PICC line in left upper extremity appears with dressing that is clean and dry.  There is paper tape across it.  No evidence of infection. Pulmonary:     Effort: Pulmonary effort is normal.     Breath sounds: Normal breath sounds.  Musculoskeletal:     Comments: Right hand significantly improved since leaving the hospital.  No obvious swelling, deformity, or discoloration.  There remains a  small little lesion on the top of his hand at the site of the previous peripheral IV.  Skin:    General: Skin is warm and dry.  Neurological:     Mental Status: He is alert and oriented to person, place, and time.  Psychiatric:        Behavior: Behavior normal.        Thought Content: Thought content normal.        Judgment: Judgment normal.         Assessment & Plan:   Patient Active Problem List   Diagnosis Date Noted  . IV site infection ([place]) 03/13/2020    Priority: High  . Bacteremia due to Enterobacter species 03/13/2020  . 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 Incidental pulmonary nodule 01/08/2020  . Pre-operative clearance 06/11/2019  . Mitral valve stenosis, severe 08/15/2017  . Moderate mitral regurgitation 08/15/2017  . Abdominal pain, epigastric 01/02/2017  . Non-intractable vomiting with nausea 01/02/2017  . 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 Hypertension, benign 01/09/2014  . Undiagnosed cardiac murmurs 01/09/2014  . Accelerated hypertension 03/11/2013  . Family history of stroke 03/11/2013  . Essential hypertension 03/11/2013  . Smoker 03/11/2013  . Chronic periodontitis, unspecified 03/11/2013     Problem List Items Addressed This Visit      Other   IV site infection [facility])    Mr. Buckle is a 54 year old male status post mitral valve replacement with mechanical valve with hospitalization complicated by Enterobacter bacteremia from a peripheral site now status post 2 weeks of treatment with cefepime and significant improvements.  Has a mild increase in white blood cell count on most recent blood work and inflammatory markers remain elevated.  Will discontinue PICC line and start ciprofloxacin for 2 additional weeks.  Plan for follow-up in 1 month or sooner if needed.          I have discontinued Brenan M. Pergola's ceFEPime. I am also having him start on ciprofloxacin.  Additionally, I  am having him maintain his lisinopril, aspirin EC, oxyCODONE, amiodarone, furosemide, metoprolol tartrate, warfarin, and potassium chloride SA.   Meds ordered this encounter  Medications  . ciprofloxacin (CIPRO) 500 MG tablet    Sig: Take 1 tablet (500 mg total) by mouth 2 (two) times daily.    Dispense:  28 tablet    Refill:  0    Order Specific Question:   Supervising Provider    Answer:   Carlyle Basques [[id number]]     Follow-up: Return in about 1 month (around 04/26/2020), or if symptoms worsen or fail to improve.    Terri Piedra, MSN, FNP-C Nurse Practitioner [facility] for [facility] [place] number: [phone]

## 2020-03-30 ENCOUNTER — Ambulatory Visit (INDEPENDENT_AMBULATORY_CARE_PROVIDER_SITE_OTHER): Payer: No Typology Code available for payment source | Admitting: Cardiology

## 2020-03-30 ENCOUNTER — Encounter: Payer: Self-pay | Admitting: Cardiology

## 2020-03-30 ENCOUNTER — Other Ambulatory Visit: Payer: Self-pay

## 2020-03-30 VITALS — BP 132/80 | HR 70 | Ht 69.0 in | Wt 171.0 lb

## 2020-03-30 DIAGNOSIS — I10 Essential (primary) hypertension: Secondary | ICD-10-CM

## 2020-03-30 DIAGNOSIS — I480 Paroxysmal atrial fibrillation: Secondary | ICD-10-CM | POA: Insufficient documentation

## 2020-03-30 DIAGNOSIS — Z954 Presence of other heart-valve replacement: Secondary | ICD-10-CM

## 2020-03-30 DIAGNOSIS — T827XXD Infection and inflammatory reaction due to other cardiac and vascular devices, implants and grafts, subsequent encounter: Secondary | ICD-10-CM

## 2020-03-30 MED ORDER — AMIODARONE HCL 200 MG PO TABS: 200.0000 mg | ORAL_TABLET | Freq: Every day | ORAL | 1 refills | Status: DC

## 2020-03-30 NOTE — Assessment & Plan Note (Signed)
Controlled.  

## 2020-03-30 NOTE — Progress Notes (Signed)
Cardiology Office Note:    Date:  03/30/2020   ID:  Melvin Burns, DOB 1966/08/11, MRN [id number]  PCP:  Chilton Si, MD  Cardiologist:  Chilton Si, MD  Electrophysiologist:  None   Referring MD: Quentin Angst, MD   Chief Complaint  Patient presents with  . Follow-up    History of Present Illness:    Melvin Burns is a 54 y.o. male, originally from [place] with a hx of mitral stenosis.  He was noted to have progression of symptoms and progression of his mitral stenosis earlier this year.  He was admitted for elective minimally invasive mitral valve replacement May 2021.  The patient had his surgery 03/06/2020 and tolerated this well.  Post op he developed PAF and was placed on Amiodarone.  He also developed gram neg sepsis from an IV site and was seen by ID and Dr Merlyn Lot.  He was eventually discharged on home IV antibiotics, Coumadin, and Amiodaorne 03/18/2020.   The patient is seen in the office today for follow up.  He says he feels well, no unusual chest discomfort or SOB.  He says his Rt hand IV site is better- no pain or swelling.  He was prescribed Cipro on 03/27/2020 after an OV with ID but the pharmacy at [facility] would not fill the Rx- I assume secondary to the patient being on Coumadin.  He has a f/u echo ordered and a F/U with Dr Orvan July office scheduled.    Past Medical History:  Diagnosis Date  . Hypertension   . Incidental pulmonary nodule 01/08/2020   Multiple in right lung - need f/u CT 1 year due to h/o smoking  . Mild aortic stenosis   . Mitral valve stenosis, severe   . Moderate mitral regurgitation   . Pneumonia    x 1  . Rheumatic mitral stenosis with regurgitation    Echo 08/08/16:  Severe mitral stenosis (mean gradient 17 mmHg).  Moderate mitral regurgitation.  . S/P minimally-invasive mitral valve replacement with mechanical valve 03/06/2020   33 mm Sorin Carbomedics Optiform bileaflet mechanical valve via right mini thoracotomy  approach  . Wears glasses   . Wears partial dentures    upper    Past Surgical History:  Procedure Laterality Date  . BILATERAL KNEE ARTHROSCOPY    . BUBBLE STUDY  12/16/2019   Procedure: BUBBLE STUDY;  Surgeon: [name], MD;  Location: [facility] ENDOSCOPY;  Service: Cardiology;;  . MITRAL VALVE REPLACEMENT Right 03/06/2020   Procedure: MINIMALLY INVASIVE MITRAL VALVE (MV) REPLACEMENT using Carbomedics Optiform 33 MM Mitral Bioprosthetic Valve.;  Surgeon: Purcell Nails, MD;  Location: [facility] OR;  Service: Open Heart Surgery;  Laterality: Right;  . MULTIPLE EXTRACTIONS WITH ALVEOLOPLASTY N/A 12/31/2019   Procedure: Extraction of tooth #'s 16-17, 20-23, and 28-30 with alveoloplasty, Bilateral mandibular tori reductions, and lateral exostoses reductions.;  Surgeon: Charlynne Pander, DDS;  Location: [facility] OR;  Service: Oral Surgery;  Laterality: N/A;  . RIGHT/LEFT HEART CATH AND CORONARY ANGIOGRAPHY N/A 12/16/2019   Procedure: RIGHT/LEFT HEART CATH AND CORONARY ANGIOGRAPHY;  Surgeon: Yvonne Kendall, MD;  Location: [facility] INVASIVE CV LAB;  Service: Cardiovascular;  Laterality: N/A;  . TEE WITHOUT CARDIOVERSION N/A 12/16/2019   Procedure: TRANSESOPHAGEAL ECHOCARDIOGRAM (TEE);  Surgeon: [name], MD;  Location: [facility] ENDOSCOPY;  Service: Cardiology;  Laterality: N/A;  . TEE WITHOUT CARDIOVERSION N/A 03/06/2020   Procedure: TRANSESOPHAGEAL ECHOCARDIOGRAM (TEE);  Surgeon: Purcell Nails, MD;  Location: [facility] OR;  Service: Open  Heart Surgery;  Laterality: N/A;    Current Medications: Current Meds  Medication Sig  . amiodarone (PACERONE) 200 MG tablet Take 1 tablet (200 mg total) by mouth daily.  Marland Kitchen aspirin EC 81 MG tablet Take 1 tablet (81 mg total) by mouth daily.  . ciprofloxacin (CIPRO) 500 MG tablet Take 1 tablet (500 mg total) by mouth 2 (two) times daily.  . furosemide (LASIX) 40 MG tablet Take 1 tablet (40 mg total) by mouth daily.  Marland Kitchen lisinopril (ZESTRIL) 10 MG tablet TAKE 1 TABLET(10 MG) BY  MOUTH DAILY (Patient taking differently: Take 10 mg by mouth daily. )  . metoprolol tartrate (LOPRESSOR) 25 MG tablet Take 1 tablet (25 mg total) by mouth 2 (two) times daily.  Marland Kitchen oxyCODONE (OXY IR/ROXICODONE) 5 MG immediate release tablet Take 1-2 tablets (5-10 mg total) by mouth every 3 (three) hours as needed for severe pain.  . potassium chloride SA (KLOR-CON) 20 MEQ tablet Take 1 tablet (20 mEq total) by mouth daily.  Marland Kitchen warfarin (COUMADIN) 5 MG tablet Take 1 tablet (5 mg total) by mouth daily at 4 PM.  . [DISCONTINUED] amiodarone (PACERONE) 200 MG tablet Take 1 tablet (200 mg total) by mouth 2 (two) times daily. X 7 days, then decrease to 200 mg ( 1 tablet) daily     Allergies:   Patient has no known allergies.   Social History   Socioeconomic History  . Marital status: Single    Spouse name: Not on file  . Number of children: Not on file  . Years of education: Not on file  . Highest education level: Not on file  Occupational History  . Occupation: [employer] account executive  Tobacco Use  . Smoking status: Former Smoker    Packs/day: 1.00    Years: 35.00    Pack years: 35.00    Types: Cigarettes    Quit date: 12/09/2019    Years since quitting: 0.3  . Smokeless tobacco: Never Used  Vaping Use  . Vaping Use: Never used  Substance and Sexual Activity  . Alcohol use: No  . Drug use: No  . Sexual activity: Yes    Partners: Female  Other Topics Concern  . Not on file  Social History Narrative   Pt is married.  He is a former Database administrator.             Social Determinants of Health   Financial Resource Strain:   . Difficulty of Paying Living Expenses:   Food Insecurity:   . Worried About Programme researcher, broadcasting/film/video in the Last Year:   . Barista in the Last Year:   Transportation Needs:   . Freight forwarder (Medical):   Marland Kitchen Lack of Transportation (Non-Medical):   Physical Activity:   . Days of Exercise per Week:   . Minutes of Exercise per Session:   Stress:   .  Feeling of Stress :   Social Connections:   . Frequency of Communication with Friends and Family:   . Frequency of Social Gatherings with Friends and Family:   . Attends Religious Services:   . Active Member of Clubs or Organizations:   . Attends Banker Meetings:   Marland Kitchen Marital Status:      Family History: The patient's family history includes Hypertension in his father and mother; Stroke in his father.  ROS:   Please see the history of present illness.     All other systems reviewed and are negative.  EKGs/Labs/Other Studies Reviewed:    The following studies were reviewed today:   EKG:  EKG is ordered today.  The ekg ordered today demonstrates NSR, QTc 473, 1st degree AVB  Recent Labs: 07/28/2019: B Natriuretic Peptide 221.3 03/02/2020: ALT 25 03/10/2020: Magnesium 2.2 03/14/2020: BUN 12; Creatinine, Ser 0.90; Potassium 4.2; Sodium 135 03/17/2020: Hemoglobin 9.4; Platelets 507  Recent Lipid Panel    Component Value Date/Time   CHOL 157 06/01/2016 1756   TRIG 129 06/01/2016 1756   HDL 47 06/01/2016 1756   CHOLHDL 3.3 06/01/2016 1756   VLDL 26 06/01/2016 1756   LDLCALC 84 06/01/2016 1756    Physical Exam:    VS:  BP 132/80 (BP Location: Right Arm, Patient Position: Sitting, Cuff Size: Normal)   Pulse 70   Ht 5\' 9"  (1.753 m)   Wt 171 lb (77.6 kg)   SpO2 97%   BMI 25.25 kg/m     Wt Readings from Last 3 Encounters:  03/30/20 171 lb (77.6 kg)  03/27/20 172 lb 3.2 oz (78.1 kg)  03/18/20 166 lb 1.6 oz (75.3 kg)     GEN:  Well nourished, well developed in no acute distress HEENT: Normal NECK: No JVD; No carotid bruits CARDIAC: RRR, no murmurs, rubs, gallops RESPIRATORY:  Clear to auscultation without rales, wheezing or rhonchi  ABDOMEN: Soft, non-tender, non-distended MUSCULOSKELETAL:  No edema; No deformity  SKIN: Warm and dry-no redness or tenderness Rt hand IV site NEUROLOGIC:  Alert and oriented x 3 PSYCHIATRIC:  Normal affect   ASSESSMENT:     S/P minimally-invasive mitral valve replacement with mechanical valve S/P MVR 03/06/2020- course complicated by PAF post op and gram neg sepsis from an IV site.   PAF (paroxysmal atrial fibrillation) (HCC) Post MVR- Coumadin and Amiodarone  IV site infection (HCC) Followed by ID S/P MVR- treated with IV ABs as an OP  Hypertension, benign Controlled  PLAN:    Unfortunately Mr Deyoung did not bring all his medications from home.  I reviewed his list but he admits he is not really sure about his medications.  I instructed him to decrease his Amiodarone to 200 mg daily.  I will message the ID service to make sure they know he did not start Cipro. F/U with Coumadin clinic,[facility], and an echo is ordered- f/u with Dr Jillene Bucks to be arranged.  I asked him to bring all medications with him when he comes in for office visits.    Medication Adjustments/Labs and Tests Ordered: Current medicines are reviewed at length with the patient today.  Concerns regarding medicines are outlined above.  Orders Placed This Encounter  Procedures  . EKG 12-Lead   Meds ordered this encounter  Medications  . amiodarone (PACERONE) 200 MG tablet    Sig: Take 1 tablet (200 mg total) by mouth daily.    Dispense:  90 tablet    Refill:  1    Patient Instructions  Medication Instructions:   DECREASE Amiodarone to 200 mg once daily.  *If you need a refill on your cardiac medications before your next appointment, please call your pharmacy*   Follow-Up: At [facility], you and your health needs are our priority.  As part of our continuing mission to provide you with exceptional heart care, we have created designated Provider Care Teams.  These Care Teams include your primary Cardiologist (physician) and Advanced Practice Providers (APPs -  Physician Assistants and Nurse Practitioners) who all work together to provide you with the care you need, when  you need it.  We recommend signing up for the patient portal  called "MyChart".  Sign up information is provided on this After Visit Summary.  MyChart is used to connect with patients for Virtual Visits (Telemedicine).  Patients are able to view lab/test results, encounter notes, upcoming appointments, etc.  Non-urgent messages can be sent to your provider as well.   To learn more about what you can do with MyChart, go to [email or web address].    Your next appointment:   3 month(s)  The format for your next appointment:   In Person  Provider:   Skeet Latch, MD   Other Instructions We will call you to let you know about the Cipro antibiotic.     Angelena Form, PA-C  03/30/2020 9:50 AM    [facility]

## 2020-03-30 NOTE — Assessment & Plan Note (Signed)
Followed by ID S/P MVR- treated with IV ABs as an OP

## 2020-03-30 NOTE — Assessment & Plan Note (Addendum)
Post MVR- Coumadin and Amiodarone

## 2020-03-30 NOTE — Patient Instructions (Signed)
Medication Instructions:   DECREASE Amiodarone to 200 mg once daily.  *If you need a refill on your cardiac medications before your next appointment, please call your pharmacy*   Follow-Up: At [facility], you and your health needs are our priority.  As part of our continuing mission to provide you with exceptional heart care, we have created designated Provider Care Teams.  These Care Teams include your primary Cardiologist (physician) and Advanced Practice Providers (APPs -  Physician Assistants and Nurse Practitioners) who all work together to provide you with the care you need, when you need it.  We recommend signing up for the patient portal called "MyChart".  Sign up information is provided on this After Visit Summary.  MyChart is used to connect with patients for Virtual Visits (Telemedicine).  Patients are able to view lab/test results, encounter notes, upcoming appointments, etc.  Non-urgent messages can be sent to your provider as well.   To learn more about what you can do with MyChart, go to [email or web address].    Your next appointment:   3 month(s)  The format for your next appointment:   In Person  Provider:   Chilton Si, MD   Other Instructions We will call you to let you know about the Cipro antibiotic.

## 2020-03-30 NOTE — Assessment & Plan Note (Signed)
S/P MVR 03/06/2020- course complicated by PAF post op and gram neg sepsis from an IV site.

## 2020-04-01 ENCOUNTER — Ambulatory Visit (INDEPENDENT_AMBULATORY_CARE_PROVIDER_SITE_OTHER): Payer: No Typology Code available for payment source | Admitting: Pharmacist

## 2020-04-01 ENCOUNTER — Other Ambulatory Visit: Payer: Self-pay

## 2020-04-01 DIAGNOSIS — Z954 Presence of other heart-valve replacement: Secondary | ICD-10-CM | POA: Diagnosis not present

## 2020-04-01 LAB — POCT INR: INR: 1.8 — AB (ref 2.0–3.0)

## 2020-04-03 ENCOUNTER — Other Ambulatory Visit: Payer: Self-pay | Admitting: Thoracic Surgery (Cardiothoracic Vascular Surgery)

## 2020-04-03 DIAGNOSIS — Z954 Presence of other heart-valve replacement: Secondary | ICD-10-CM

## 2020-04-06 ENCOUNTER — Ambulatory Visit (INDEPENDENT_AMBULATORY_CARE_PROVIDER_SITE_OTHER): Payer: Self-pay | Admitting: Physician Assistant

## 2020-04-06 ENCOUNTER — Ambulatory Visit
Admission: RE | Admit: 2020-04-06 | Discharge: 2020-04-06 | Disposition: A | Discharge status: Home / Self Care | Payer: No Typology Code available for payment source | Source: Ambulatory Visit | Attending: Thoracic Surgery (Cardiothoracic Vascular Surgery) | Admitting: Thoracic Surgery (Cardiothoracic Vascular Surgery)

## 2020-04-06 ENCOUNTER — Other Ambulatory Visit: Payer: Self-pay

## 2020-04-06 VITALS — BP 140/80 | HR 70 | Temp 97.7°F | Resp 20 | Ht 69.0 in | Wt 170.0 lb

## 2020-04-06 DIAGNOSIS — Z954 Presence of other heart-valve replacement: Secondary | ICD-10-CM

## 2020-04-06 NOTE — Patient Instructions (Addendum)
Continue medications as prescribed. May resume work on 04/13/20 Repeat Echo scheduled for Friday 04/17/20 Follow up with Dr. [name] after the Echo.

## 2020-04-06 NOTE — Progress Notes (Signed)
HPI:  Patient returns for routine postoperative follow-up having undergone mitral valve replacement with a mechanical prosthesis by Dr. [name] on 03/06/2020.  His postoperative course was complicated by gram-negative bacteremia that was felt to be most likely related to an infected peripheral IV site.  He was treated in the hospital with IV antibiotics and discharged on cefepime IV daily by way of a PICC line.  This was managed by the infectious disease service.  He was seen for follow-up on 03/27/20 by Mr. Marcos Eke, FNP, and the cefepime was discontinued and he was started on Cipro.    Since hospital discharge the patient reports he has continued to progress.  He denies fever, pain, or shortness of breath. His appetite is good and he has not noticed any new swelling.  His last INR was 1.8 on 6/23. Since then his Coumadin dose was increased from 5mg  QOD to 5mg  daily.  Medications wee reviewed and he is taking them as prescribed. He has completed the Lasix and KCL.     Current Outpatient Medications  Medication Sig Dispense Refill  . amiodarone (PACERONE) 200 MG tablet Take 1 tablet (200 mg total) by mouth daily. 90 tablet 1  . aspirin EC 81 MG tablet Take 1 tablet (81 mg total) by mouth daily.    . ciprofloxacin (CIPRO) 500 MG tablet Take 1 tablet (500 mg total) by mouth 2 (two) times daily. 28 tablet 0  . furosemide (LASIX) 40 MG tablet Take 1 tablet (40 mg total) by mouth daily. (Patient not taking: Reported on 04/01/2020) 7 tablet 0  . lisinopril (ZESTRIL) 10 MG tablet TAKE 1 TABLET(10 MG) BY MOUTH DAILY (Patient not taking: Reported on 04/01/2020) 90 tablet 1  . metoprolol tartrate (LOPRESSOR) 25 MG tablet Take 1 tablet (25 mg total) by mouth 2 (two) times daily. 60 tablet 3  . potassium chloride SA (KLOR-CON) 20 MEQ tablet Take 1 tablet (20 mEq total) by mouth daily. 7 tablet 0  . warfarin (COUMADIN) 5 MG tablet Take 1 tablet (5 mg total) by mouth daily at 4 PM. 30 tablet 3   No current  facility-administered medications for this visit.    Physical Exam   VS: Blood pressure  140/80 Pulse    70 Respirations   20 Temperature   97.7 SpO2    95% none Today for 1 year   General:  Well appearing 54yo male. No concerns except for recurring bursitis in his left elbow.   Heart: RRR, crisp click of his mechanical mitral valve.   Chest: Breath sounds are clear, full, and equal.  Ext''s: no peripheral edema. The olak IV site on the dorsum of his right hand  Remains indurated but is wothout tenderness or erythema.    Diagnostic Tests:   EXAM: CHEST - 2 VIEW  COMPARISON:  03/11/2020  FINDINGS: There is no focal consolidation. There is no pleural effusion or pneumothorax. The heart and mediastinal contours are unremarkable. There is evidence of prior mitral valve replacement.  There is no acute osseous abnormality.  IMPRESSION: No active cardiopulmonary disease.   Electronically Signed   By: 54yo   On: 04/06/2020 13:16   Impression / Plan: Satisfactory progress 1 month status post mini mitral valve replacement for rheumatic valve disease manifested by severe mitral valve stenosis with regurgitation.  His postoperative course was complicated by gram-negative bacteremia.  He has completed course of IV antibiotics as prescribed by the infectious disease service.  He is now on oral Cipro.  His Coumadin is being managed by the Coumadin clinic.  Last INR was 1.8 04/01/2020.  His Coumadin dose was increased at that time.  His next INR is on 03/22/2020.  Mr. Xiang is anxious to return to work.  He would like to begin on April 13, 2020.  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  His follow-up echocardiogram is scheduled for 04/17/2020.  We will plan to schedule him to see Dr.Owen after that study.  I asked him to continue with his medications as prescribed.  I also counseled him regarding careful life-long management  of his Coumadin and the importance of  prophylactic antibiotics prior to any invasive procedure. He remains in a sinus rhythm-suspect his amiodarone can be discontinued in the near future.  Antony Odea, PA-C [facility] [phone]

## 2020-04-07 ENCOUNTER — Encounter (HOSPITAL_COMMUNITY): Payer: Self-pay

## 2020-04-07 NOTE — Progress Notes (Signed)
Cardiac Rehab Note:  Clinical review of pt follow up appt on 6/28/21with [facility] Jillyn Hidden office note. Also reviewed ID office notes and picc line removal note from 03/27/20. Patient placed on oral cipro for 2 weeks and IV antibiotics discontinued. Per CV[facility] patient can resume work 04/17/20. Follow up Echo and [facility] provider 04/17/20.  Pt is making the expected progress in recovery.  Pt appropriate for scheduling for on site cardiac rehab and/or enrollment in. Virtual Cardiac Rehab. Pt Covid score is 3. Will forward to staff for follow up.    Draiden Mirsky E. Suzie Portela RN, BSN [place]. [facility]  Cardiac and Pulmonary Rehabilitation Phone: [phone] Fax: [phone]

## 2020-04-08 ENCOUNTER — Ambulatory Visit (INDEPENDENT_AMBULATORY_CARE_PROVIDER_SITE_OTHER): Payer: No Typology Code available for payment source | Admitting: Pharmacist

## 2020-04-08 ENCOUNTER — Other Ambulatory Visit: Payer: Self-pay

## 2020-04-08 DIAGNOSIS — Z954 Presence of other heart-valve replacement: Secondary | ICD-10-CM | POA: Diagnosis not present

## 2020-04-08 LAB — POCT INR: INR: 3.1 — AB (ref 2.0–3.0)

## 2020-04-09 ENCOUNTER — Telehealth (HOSPITAL_COMMUNITY): Payer: Self-pay

## 2020-04-09 NOTE — Telephone Encounter (Signed)
Called and spoke with pt in regards to CR, pt stated he is not able to attend due to his work schedule at this time.  Closed referral

## 2020-04-15 ENCOUNTER — Ambulatory Visit (INDEPENDENT_AMBULATORY_CARE_PROVIDER_SITE_OTHER): Payer: No Typology Code available for payment source

## 2020-04-15 ENCOUNTER — Other Ambulatory Visit: Payer: Self-pay

## 2020-04-15 DIAGNOSIS — Z5181 Encounter for therapeutic drug level monitoring: Secondary | ICD-10-CM

## 2020-04-15 DIAGNOSIS — Z954 Presence of other heart-valve replacement: Secondary | ICD-10-CM | POA: Diagnosis not present

## 2020-04-15 LAB — POCT INR: INR: 4.5 — AB (ref 2.0–3.0)

## 2020-04-15 NOTE — Patient Instructions (Signed)
Hold Today and then continue taking 1 tablet (5mg ) every day, except 0.5 tablet Saturday. Next INR in 1 week.

## 2020-04-17 ENCOUNTER — Encounter (HOSPITAL_COMMUNITY): Payer: Self-pay | Admitting: Cardiology

## 2020-04-17 ENCOUNTER — Other Ambulatory Visit (HOSPITAL_COMMUNITY): Payer: No Typology Code available for payment source

## 2020-04-17 NOTE — Progress Notes (Unsigned)
Patient ID: Melvin Burns, male   DOB: 11-29-1965, 54 y.o.   MRN: [id number]   Verified appointment "no show" status with Registration Desk 5 at 11:42am.

## 2020-04-22 ENCOUNTER — Ambulatory Visit (INDEPENDENT_AMBULATORY_CARE_PROVIDER_SITE_OTHER): Payer: No Typology Code available for payment source

## 2020-04-22 ENCOUNTER — Other Ambulatory Visit: Payer: Self-pay

## 2020-04-22 DIAGNOSIS — Z5181 Encounter for therapeutic drug level monitoring: Secondary | ICD-10-CM

## 2020-04-22 DIAGNOSIS — Z954 Presence of other heart-valve replacement: Secondary | ICD-10-CM

## 2020-04-22 LAB — POCT INR: INR: 3.3 — AB (ref 2.0–3.0)

## 2020-04-22 NOTE — Patient Instructions (Signed)
continue taking 1 tablet (5mg ) every day, except 0.5 tablet Saturday. Next INR in 2 weeks.

## 2020-04-23 ENCOUNTER — Other Ambulatory Visit: Payer: Self-pay | Admitting: Cardiology

## 2020-04-27 ENCOUNTER — Encounter: Payer: Self-pay | Admitting: Thoracic Surgery (Cardiothoracic Vascular Surgery)

## 2020-04-27 ENCOUNTER — Other Ambulatory Visit: Payer: Self-pay

## 2020-04-27 ENCOUNTER — Ambulatory Visit (INDEPENDENT_AMBULATORY_CARE_PROVIDER_SITE_OTHER): Payer: Self-pay | Admitting: Thoracic Surgery (Cardiothoracic Vascular Surgery)

## 2020-04-27 VITALS — BP 179/105 | HR 73 | Temp 98.4°F | Resp 20 | Ht 69.0 in | Wt 180.0 lb

## 2020-04-27 DIAGNOSIS — Z954 Presence of other heart-valve replacement: Secondary | ICD-10-CM

## 2020-04-27 MED ORDER — LISINOPRIL 10 MG PO TABS: 20.0000 mg | ORAL_TABLET | Freq: Every day | ORAL | 1 refills | Status: DC

## 2020-04-27 NOTE — Progress Notes (Signed)
[address]       [name] [address]             [phone]     CARDIOTHORACIC SURGERY OFFICE NOTE  Primary Cardiologist is Chilton Si, MD PCP is Chilton Si, MD   HPI:  Patient is a 54 year old male originally from [place] in [place] with history of rheumatic mitral valve disease and hypertension who returns the office today for routine follow-up status post minimally invasive mitral valve replacement using a bileaflet mechanical prosthetic valve on Mar 06, 2020.  The patient's early postoperative recovery was notable for the development of severe cellulitis and bacteremia related to infection of the peripheral IV site.  Blood cultures grew Enterobacter cloacae.  The patient was treated with a 2-week course of intravenous cefepime.  He was seen in follow-up in the infectious disease clinic March 27, 2020 at which time his PICC line removed, IV antibiotics stopped, and oral ciprofloxacin started for an additional 2 weeks.  He has not had follow-up blood cultures drawn since he completed his course of antibiotics.  He was last seen here in our office on April 06, 2020 at which time he was recovering uneventfully.  His prothrombin time has been followed in the Coumadin clinic, most recently 3.3 on April 22, 2020.  Patient returns to the office today and reports he is doing exceptionally well.  He no longer has any pain in his chest related to his surgery.  He reports his breathing is very good, notably much better than it was prior to surgery.  He is back at work and overall getting along quite well.  He has not had fevers or chills.  His right arm healed completely.  He was surprised to find out how high his blood pressure was when it was checked today.  He does not routinely check it at home.  He has not had chest pain or dizziness.   Current Outpatient Medications  Medication Sig Dispense Refill  . amiodarone (PACERONE) 200 MG tablet Take 1 tablet (200 mg  total) by mouth daily. 90 tablet 1  . aspirin EC 81 MG tablet Take 1 tablet (81 mg total) by mouth daily.    Marland Kitchen lisinopril (ZESTRIL) 10 MG tablet TAKE 1 TABLET(10 MG) BY MOUTH DAILY 90 tablet 1  . metoprolol tartrate (LOPRESSOR) 25 MG tablet Take 1 tablet (25 mg total) by mouth 2 (two) times daily. 60 tablet 3  . warfarin (COUMADIN) 5 MG tablet Take 1 tablet (5 mg total) by mouth daily at 4 PM. 30 tablet 3   No current facility-administered medications for this visit.      Physical Exam:   BP (!) 179/105 (BP Location: Right Arm, Patient Position: Sitting, Cuff Size: Normal)   Pulse 73   Temp 98.4 F (36.9 C) (Temporal)   Resp 20   Ht 5\' 9"  (1.753 m)   Wt 180 lb (81.6 kg)   SpO2 97% Comment: RA  BMI 26.58 kg/m   General:  Well-appearing  Chest:   Clear to auscultation  CV:   Regular rate and rhythm with mechanical heart valve sounds  Incisions:  Completely healed  Abdomen:  Soft nontender  Extremities:  Warm well perfused  Diagnostic Tests:  CHEST - 2 VIEW  COMPARISON:  03/11/2020  FINDINGS: There is no focal consolidation. There is no pleural effusion or pneumothorax. The heart and mediastinal contours are unremarkable. There is evidence of prior mitral valve replacement.  There  is no acute osseous abnormality.  IMPRESSION: No active cardiopulmonary disease.   Electronically Signed   By: Elige Ko   On: 04/06/2020 13:16   Impression:  Patient is doing very well approximately 2 months status post minimally invasive mitral valve replacement using a mechanical prosthetic valve for rheumatic mitral valve disease with severe mitral stenosis.  The patient has not had recurrent fevers chills or other systemic signs of infection since he completed his course of antibiotics for postoperative cellulitis and sepsis related to infected peripheral IV.  His blood pressure is elevated in our office today.  Plan:  Patient may resume unrestricted physical activity at  this time.  I have instructed the patient to stop taking amiodarone since he has had no recurrence of atrial fibrillation since hospital discharge.  I have instructed the patient to notify the pharmacist at the Coumadin clinic that amiodarone has been discontinued.  I have also instructed the patient to increase his dose of lisinopril to 20 mg daily and to start checking his blood pressure on a regular basis.  Prior to surgery he was also on amlodipine.  This may need to be resumed if his blood pressure remains elevated.  The patient is scheduled for routine follow-up with Dr. Duke Salvia in September.  He will contact Dr. Leonides Sake sooner if his blood pressure remains elevated.  He will return to our office for routine follow-up next May, approximately 1 year following his surgery.  He will call and return sooner should specific problems or questions arise.    Salvatore Decent. [name], MD 04/27/2020 4:17 PM

## 2020-04-27 NOTE — Patient Instructions (Addendum)
Stop taking amiodarone and let the pharmacist know at the Coumadin clinic  Increase your dose of lisinopril to 20 mg/day  Check your blood pressure on a regular basis and keep a log for your records.  Discussed with your cardiologist and/or primary care physician whether or not your blood pressure medications should be adjusted.  You may resume unrestricted physical activity without any particular limitations at this time.  Endocarditis is a potentially serious infection of heart valves or inside lining of the heart.  It occurs more commonly in patients with diseased heart valves (such as patient's with aortic or mitral valve disease) and in patients who have undergone heart valve repair or replacement.  Certain surgical and dental procedures may put you at risk, such as dental cleaning, other dental procedures, or any surgery involving the respiratory, urinary, gastrointestinal tract, gallbladder or prostate gland.   To minimize your chances for develooping endocarditis, maintain good oral health and seek prompt medical attention for any infections involving the mouth, teeth, gums, skin or urinary tract.    Always notify your doctor or dentist about your underlying heart valve condition before having any invasive procedures. You will need to take antibiotics before certain procedures, including all routine dental cleanings or other dental procedures.  Your cardiologist or dentist should prescribe these antibiotics for you to be taken ahead of time.

## 2020-05-06 ENCOUNTER — Other Ambulatory Visit: Payer: Self-pay

## 2020-05-06 ENCOUNTER — Ambulatory Visit (INDEPENDENT_AMBULATORY_CARE_PROVIDER_SITE_OTHER): Payer: No Typology Code available for payment source

## 2020-05-06 DIAGNOSIS — Z954 Presence of other heart-valve replacement: Secondary | ICD-10-CM

## 2020-05-06 DIAGNOSIS — Z5181 Encounter for therapeutic drug level monitoring: Secondary | ICD-10-CM

## 2020-05-06 LAB — POCT INR: INR: 3.2 — AB (ref 2.0–3.0)

## 2020-05-06 NOTE — Patient Instructions (Signed)
continue taking 1 tablet (5mg ) every day, except 0.5 tablet Saturday. Next INR in 4 weeks

## 2020-05-08 ENCOUNTER — Ambulatory Visit (HOSPITAL_COMMUNITY): Payer: No Typology Code available for payment source | Attending: Cardiovascular Disease

## 2020-05-08 ENCOUNTER — Other Ambulatory Visit: Payer: Self-pay

## 2020-05-08 DIAGNOSIS — Z952 Presence of prosthetic heart valve: Secondary | ICD-10-CM | POA: Insufficient documentation

## 2020-05-08 DIAGNOSIS — I340 Nonrheumatic mitral (valve) insufficiency: Secondary | ICD-10-CM | POA: Insufficient documentation

## 2020-05-08 DIAGNOSIS — I050 Rheumatic mitral stenosis: Secondary | ICD-10-CM | POA: Diagnosis present

## 2020-05-08 LAB — ECHOCARDIOGRAM COMPLETE
AR max vel: 1.67 cm2
AV Area VTI: 1.6 cm2
AV Area mean vel: 1.58 cm2
AV Mean grad: 9.7 mmHg
AV Peak grad: 20.3 mmHg
Ao pk vel: 2.25 m/s
Area-P 1/2: 2.91 cm2
P 1/2 time: 609 msec
S' Lateral: 3.3 cm

## 2020-06-03 ENCOUNTER — Ambulatory Visit (INDEPENDENT_AMBULATORY_CARE_PROVIDER_SITE_OTHER): Payer: No Typology Code available for payment source | Admitting: Pharmacist

## 2020-06-03 ENCOUNTER — Other Ambulatory Visit: Payer: Self-pay

## 2020-06-03 DIAGNOSIS — Z954 Presence of other heart-valve replacement: Secondary | ICD-10-CM | POA: Diagnosis not present

## 2020-06-03 LAB — POCT INR: INR: 2.6 (ref 2.0–3.0)

## 2020-07-01 ENCOUNTER — Encounter: Payer: Self-pay | Admitting: Cardiovascular Disease

## 2020-07-01 ENCOUNTER — Ambulatory Visit (INDEPENDENT_AMBULATORY_CARE_PROVIDER_SITE_OTHER): Payer: No Typology Code available for payment source

## 2020-07-01 ENCOUNTER — Ambulatory Visit (INDEPENDENT_AMBULATORY_CARE_PROVIDER_SITE_OTHER): Payer: No Typology Code available for payment source | Admitting: Cardiovascular Disease

## 2020-07-01 ENCOUNTER — Other Ambulatory Visit: Payer: Self-pay

## 2020-07-01 VITALS — BP 176/100 | HR 75 | Ht 69.0 in | Wt 181.2 lb

## 2020-07-01 DIAGNOSIS — I10 Essential (primary) hypertension: Secondary | ICD-10-CM | POA: Diagnosis not present

## 2020-07-01 DIAGNOSIS — Z954 Presence of other heart-valve replacement: Secondary | ICD-10-CM

## 2020-07-01 DIAGNOSIS — I480 Paroxysmal atrial fibrillation: Secondary | ICD-10-CM

## 2020-07-01 DIAGNOSIS — I052 Rheumatic mitral stenosis with insufficiency: Secondary | ICD-10-CM

## 2020-07-01 DIAGNOSIS — Z5181 Encounter for therapeutic drug level monitoring: Secondary | ICD-10-CM

## 2020-07-01 LAB — POCT INR: INR: 2.3 (ref 2.0–3.0)

## 2020-07-01 MED ORDER — NEBIVOLOL HCL 5 MG PO TABS
5.0000 mg | ORAL_TABLET | Freq: Every day | ORAL | 1 refills | Status: DC
Start: 2020-07-01 — End: 2021-01-08

## 2020-07-01 MED ORDER — AMLODIPINE BESYLATE 10 MG PO TABS
10.0000 mg | ORAL_TABLET | Freq: Every day | ORAL | 3 refills | Status: DC
Start: 2020-07-01 — End: 2020-12-21

## 2020-07-01 NOTE — Progress Notes (Signed)
Cardiology Office Note   Date:  07/01/2020   ID:  [place], DOB November 01, 1965, MRN [id number]  PCP:  Chilton Si, MD  Cardiologist:   Chilton Si, MD   No chief complaint on file.   History of Present Illness: [place] is a 54 y.o. 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  [place] was first seen 07/05/16 due to an abnormal EKG.  He saw his PCP on 8/23 and was noted to have PACs as well as a PVC. He was also noted to have LVH with repolarization abnormality.  He was referred to cardiology for further evaluation.  At that appointment he was noted to have a murmur on exam.  He had an echo 08/08/16 that showed LVEF 60-65%.  He had severe mitral stenosis with a mean gradient of 17 mmHg and moderate mitral regurgitation.  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  His blood presure was elevated and lisinopril was added to his regimen.  He did well clinically and exercise at the gym 5 to 7 days/week without any chest pain or shortness of breath.  He was treated for pneumonia 07/2019.  Echo at that time revealed a mean mitral valve gradient of 8 mmHg.  After that he stopped going to the gym as regularly.  His last appointment he reported some increased shortness of breath.  He had no edema, orthopnea, or PND.  It was unclear whether his symptoms were due to progressive valvular disease or residual dyspnea from pneumonia.  He was referred for an exercise stress echo to see if his gradient increased with exercise.  Unfortunately, the gradient was not measured during the stress test.  It was negative for ischemia.  He did develop chest pain and short runs of VT.  Therefore it was felt that he has symptomatic mitral stenosis and we discussed referring him for TEE and  cath in preparation for mitral valve replacement.  He presents today for preprocedural assessment.  [name] was referred for minimally invasive mechanical mitral valve replacement 02/2020.  He developed cellulitis and bacteremia post operatively.  He was treated with 2 weeks of cefepime.  He also had postoperative atrial fibrillation and was started on metoprolol.  He last followed up with Dr. Priscille Kluver on 04/2020 and was doing well.  His blood pressure was elevated at 179/105 at that appointment.  Prior to surgery he was on amlodipine 10 mg, chlorthalidone 25 mg, and lisinopril 10 mg.  He was discharged on lisinopril 10 mg and metoprolol 25mg  bid.   Dr. increased lisinopril to 20mg .  He bought a blood pressure cuff and has been checking his blood pressure at home.  It has been running as high as the 170s systolic.  Since his surgeries had some numbness at the surgical incision site.  He has occasional sharp pain at the incision site as well.  He is back to exercising and rides an exercise bike.  He feels good with exercise and has no exertional chest pain or shortness of breath.  He does sometimes get tired when he tries to jog.  He has no lower extremity edema, orthopnea, or PND.  He has noticed that he has been more sleepy since surgery.  He is planning to go home to [place] for a month and will be getting  married while there.   Past Medical History:  Diagnosis Date  . Hypertension   . Incidental pulmonary nodule 01/08/2020   Multiple in right lung - need f/u CT 1 year due to h/o smoking  . Mild aortic stenosis   . Mitral valve stenosis, severe   . Moderate mitral regurgitation   . Pneumonia    x 1  . Rheumatic mitral stenosis with regurgitation    Echo 08/08/16:  Severe mitral stenosis (mean gradient 17 mmHg).  Moderate mitral regurgitation.  . S/P minimally-invasive mitral valve replacement with mechanical valve 03/06/2020   33 mm Sorin Carbomedics Optiform bileaflet mechanical valve via right  mini thoracotomy approach  . Wears glasses   . Wears partial dentures    upper    Past Surgical History:  Procedure Laterality Date  . BILATERAL KNEE ARTHROSCOPY    . BUBBLE STUDY  12/16/2019   Procedure: BUBBLE STUDY;  Surgeon: [name], MD;  Location: [facility] ENDOSCOPY;  Service: Cardiology;;  . MITRAL VALVE REPLACEMENT Right 03/06/2020   Procedure: MINIMALLY INVASIVE MITRAL VALVE (MV) REPLACEMENT using Carbomedics Optiform 33 MM Mitral Bioprosthetic Valve.;  Surgeon: Purcell Nails, MD;  Location: [facility] OR;  Service: Open Heart Surgery;  Laterality: Right;  . MULTIPLE EXTRACTIONS WITH ALVEOLOPLASTY N/A 12/31/2019   Procedure: Extraction of tooth #'s 16-17, 20-23, and 28-30 with alveoloplasty, Bilateral mandibular tori reductions, and lateral exostoses reductions.;  Surgeon: Charlynne Pander, DDS;  Location: [facility] OR;  Service: Oral Surgery;  Laterality: N/A;  . RIGHT/LEFT HEART CATH AND CORONARY ANGIOGRAPHY N/A 12/16/2019   Procedure: RIGHT/LEFT HEART CATH AND CORONARY ANGIOGRAPHY;  Surgeon: Yvonne Kendall, MD;  Location: [facility] INVASIVE CV LAB;  Service: Cardiovascular;  Laterality: N/A;  . TEE WITHOUT CARDIOVERSION N/A 12/16/2019   Procedure: TRANSESOPHAGEAL ECHOCARDIOGRAM (TEE);  Surgeon: [name], MD;  Location: [facility] ENDOSCOPY;  Service: Cardiology;  Laterality: N/A;  . TEE WITHOUT CARDIOVERSION N/A 03/06/2020   Procedure: TRANSESOPHAGEAL ECHOCARDIOGRAM (TEE);  Surgeon: Purcell Nails, MD;  Location: [facility] OR;  Service: Open Heart Surgery;  Laterality: N/A;     Current Outpatient Medications  Medication Sig Dispense Refill  . lisinopril (ZESTRIL) 10 MG tablet Take 10 mg by mouth daily.    Marland Kitchen amLODipine (NORVASC) 10 MG tablet Take 1 tablet (10 mg total) by mouth daily. 180 tablet 3  . aspirin EC 81 MG tablet Take 1 tablet (81 mg total) by mouth daily.    . nebivolol (BYSTOLIC) 5 MG tablet Take 1 tablet (5 mg total) by mouth daily. 90 tablet 1  . warfarin (COUMADIN) 5 MG tablet Take  1 tablet (5 mg total) by mouth daily at 4 PM. 30 tablet 3   No current facility-administered medications for this visit.    Allergies:   Patient has no known allergies.    Social History:  The patient  reports that he quit smoking about 6 months ago. His smoking use included cigarettes. He has a 35.00 pack-year smoking history. He has never used smokeless tobacco. He reports that he does not drink alcohol and does not use drugs.   Family History:  The patient's family history includes Hypertension in his father and mother; Stroke in his father.    ROS:  Please see the history of present illness.   Otherwise, review of systems are positive for none.   All other systems are reviewed and negative.    PHYSICAL EXAM: VS:  BP (!) 176/100   Pulse 75   Ht 5\' 9"  (1.753  m)   Wt 181 lb 3.2 oz (82.2 kg)   SpO2 98%   BMI 26.76 kg/m  , BMI Body mass index is 26.76 kg/m. GENERAL:  Well appearing.  No acute distress HEENT: Pupils equal round and reactive, fundi not visualized, oral mucosa unremarkable NECK:  No jugular venous distention, waveform within normal limits, carotid upstroke brisk and symmetric, no bruits LUNGS:  Clear to auscultation bilaterally HEART:  RRR.  PMI not displaced or sustained, mechanical S1 and normal S2 within normal limits, no S3, no S4, no clicks, no rubs, II/VI holosystolic murmur at the apex.   ABD:  Flat, positive bowel sounds normal in frequency in pitch, no bruits, no rebound, no guarding, no midline pulsatile mass, no hepatomegaly, no splenomegaly EXT:  2 plus pulses throughout, no edema, no cyanosis no clubbing SKIN:  No rashes no nodules NEURO:  Cranial nerves II through XII grossly intact, motor grossly intact throughout PSYCH:  Cognitively intact, oriented to person place and time   EKG:  EKG is not ordered today. The ekg ordered 12/15/16 demonstrates sinus rhythm.  Rate 81 bpm.  PACs.  LVH with repolariazation abnormality.  08/15/17: Sinus rhythm.  Rate 76  bpm.  LVH with secondary repolarization abnormality. 06/06/18: Sinus rhythm.  Rate 73 bpm.  LVH. 08/29/2019: Sinus rhythm.  PAC.  Rate 75 bpm.  Left atrial enlargement.  Inferior T wave flattening. 12/10/19: Sinus rhythm.  PACs.  Rate 85 bpm  LVH with repolarization abnormality  Echo 08/08/16: Study Conclusions  - Left ventricle: The cavity size was normal. There was mild focal   basal hypertrophy of the septum. Systolic function was normal.   The estimated ejection fraction was in the range of 60% to 65%.   Wall motion was normal; there were no regional wall motion   abnormalities. Features are consistent with a pseudonormal left   ventricular filling pattern, with concomitant abnormal relaxation   and increased filling pressure (grade 2 diastolic dysfunction).   Doppler parameters are consistent with elevated ventricular   end-diastolic filling pressure. - Aortic valve: Transvalvular velocity was minimally increased.   There was mild stenosis. There was mild regurgitation. Peak   gradient (S): 25 mm Hg. - Aortic root: The aortic root was normal in size. - Mitral valve: Severely thickened and mildly calcified mitral   valve that is rheumatic in apearance. There is limited leaflet   separation with severe mitral stenosis and moderate   regurgitation. The findings are consistent with severe stenosis.   There was moderate regurgitation. Mean gradient (D): 17 mm Hg.   Peak gradient (D): 40 mm Hg. - Left atrium: The atrium was severely dilated. - Right ventricle: Systolic function was normal. - Tricuspid valve: There was mild regurgitation. - Pulmonic valve: There was no regurgitation. - Pulmonary arteries: Systolic pressure was within the normal   range. - Inferior vena cava: The vessel was normal in size. - Pericardium, extracardiac: There was no pericardial effusion.  Impressions:  - Severely thickened and mildly calcified mitral valve that is   rheumatic in apearance. There is  limited leaflet eparation with   severe mitral stenosis and moderate regurgitation.  Echo 08/14/19: IMPRESSIONS   1. Left ventricular ejection fraction, by visual estimation, is 60 to 65%. The left ventricle has normal function. There is mildly increased left ventricular hypertrophy.  2. Left ventricular diastolic parameters are indeterminate.  3. Global right ventricle has normal systolic function.The right ventricular size is normal. No increase in right ventricular wall thickness.  4. Left atrial size was normal.  5. Right atrial size was normal.  6. Severe calcification of the mitral valve leaflet(s).  7. Severe thickening of the mitral valve leaflet(s).  8. Severely decreased mobility of the mitral valve leaflets.  9. The mitral valve is rheumatic. Trace mitral valve regurgitation. Moderate-severe mitral stenosis. 10. The tricuspid valve is normal in structure. Tricuspid valve regurgitation is trivial. 11. The aortic valve is tricuspid. Aortic valve regurgitation is not visualized. moderate sclerosis. 12. Small gradient across aortic valve without stenosis. 13. The pulmonic valve was grossly normal. Pulmonic valve regurgitation is trivial. 14. The inferior vena cava is normal in size with greater than 50% respiratory variability, suggesting right atrial pressure of 3 mmHg.  Echo 05/08/20: 1. No significant change from echo done June 2021.  2. Low normal GLS -14. Left ventricular ejection fraction, by estimation,  is 60 to 65%. The left ventricle has normal function. The left ventricle  has no regional wall motion abnormalities. There is mild left ventricular  hypertrophy. Left ventricular  diastolic parameters are indeterminate.  3. Right ventricular systolic function is normal. The right ventricular  size is normal.  4. Left atrial size was severely dilated.  5. Mechanical MV replacement with stable diastolic gradients and no  residual MR or PVL. The mitral valve has been  repaired/replaced. No  evidence of mitral valve regurgitation. No evidence of mitral stenosis.  There is a Medtronic mechanical valve present  in the mitral position. Procedure Date: 03/06/20.  6. The aortic valve is tricuspid. Aortic valve regurgitation is mild.  Mild aortic valve stenosis.  7. The inferior vena cava is normal in size with greater than 50%  respiratory variability, suggesting right atrial pressure of 3 mmHg.   Comparison(s): 03/11/20 EF 60-65%. MV 8mmHg mean.    Recent Labs: 07/28/2019: B Natriuretic Peptide 221.3 03/02/2020: ALT 25 03/10/2020: Magnesium 2.2 03/14/2020: BUN 12; Creatinine, Ser 0.90; Potassium 4.2; Sodium 135 03/17/2020: Hemoglobin 9.4; Platelets 507    Lipid Panel    Component Value Date/Time   CHOL 157 06/01/2016 1756   TRIG 129 06/01/2016 1756   HDL 47 06/01/2016 1756   CHOLHDL 3.3 06/01/2016 1756   VLDL 26 06/01/2016 1756   LDLCALC 84 06/01/2016 1756      Wt Readings from Last 3 Encounters:  07/01/20 181 lb 3.2 oz (82.2 kg)  04/27/20 180 lb (81.6 kg)  04/06/20 170 lb (77.1 kg)      ASSESSMENT AND PLAN:  # Rheumatic valve disease: # s/p mechanical mitral valve: # Moderate mitral regurgitation: # Mild aortic stenosis: [name] had severe mitral stenosis and moderate mitral regurgitation.  He underwent mitral valve replacement and is doing well.  He is euvolemic.  Mean gradient was 5 mmHg and he had no mitral regurgitation. INR goal 2.5-3.5.  Continue aspirin.  He needs antibiotics before dental procedures.  # Hypertension: Blood pressure is elevated.  There were several changes made to his antihypertensive regimen in the hospital due to his postsurgical state and atrial fibrillation.  He has a lot of fatigue which I think is likely due to the metoprolol.  We will stop metoprolol and start nebivolol 5 mg daily.  He will go back to lisinopril 10 mg a day and we will add back his amlodipine 10 mg daily.  Continue to track blood pressures  daily.  He was previously on chlorthalidone.  We will see if this needs to be added back as well.  # Tobacco abuse: He was  congratulated on smoking cessation.  # Paroxysmal atrial fibrillation:  This was postoperative.  He is currently in sinus rhythm.  Switching metoprolol to nebivolol as above and continue warfarin  Time spent: 20 minutes-Greater than 50% of this time was spent in counseling, explanation of diagnosis, planning of further management, and coordination of care.  Complex decision making.    Current medicines are reviewed at length with the patient today.  The patient does not have concerns regarding medicines.  The following changes have been made:    Labs/ tests ordered today include:   No orders of the defined types were placed in this encounter.    Disposition:   FU with Inman Fettig C. Duke Salvia, MD, [facility] in 6 months.     Signed, Lorrena Goranson C. Duke Salvia, MD, [facility]  07/01/2020 5:17 PM    [facility]

## 2020-07-01 NOTE — Patient Instructions (Addendum)
Medication Instructions:  RESTART AMLODIPINE 10 MG DAILY   START BYSTOLIC 5 MG DAILY   STOP METOPROLOL   DECREASE YOUR LISINOPRIL TO 10 MG DAILY   *If you need a refill on your cardiac medications before your next appointment, please call your pharmacy*  Lab Work: NONE  Testing/Procedures: NONE   Follow-Up: At [facility], you and your health needs are our priority.  As part of our continuing mission to provide you with exceptional heart care, we have created designated Provider Care Teams.  These Care Teams include your primary Cardiologist (physician) and Advanced Practice Providers (APPs -  Physician Assistants and Nurse Practitioners) who all work together to provide you with the care you need, when you need it.  We recommend signing up for the patient portal called "MyChart".  Sign up information is provided on this After Visit Summary.  MyChart is used to connect with patients for Virtual Visits (Telemedicine).  Patients are able to view lab/test results, encounter notes, upcoming appointments, etc.  Non-urgent messages can be sent to your provider as well.   To learn more about what you can do with MyChart, go to [email or web address].    Your next appointment:   07/29/20 AT 3:00 PM WITH PHARM D FOR INR AND BLOOD PRESSURE FOLLOW UP   12/24/2020 AT 3:00 WITH DR Aber[place]  MONITOR AND LOG  YOUR BLOOD PRESSURE 1 TO 2 TIMES A DAY, BRING YOUR READINGS AND MACHINE TO FOLLOW UP

## 2020-07-01 NOTE — Patient Instructions (Signed)
Take 2 tablets tonight and then Continue taking 1 tablet (5mg ) every day, except 1/2 tablet Saturday. Next INR in 4 weeks.

## 2020-07-06 ENCOUNTER — Other Ambulatory Visit: Payer: Self-pay | Admitting: Cardiovascular Disease

## 2020-07-06 NOTE — Telephone Encounter (Signed)
New Message:     Pt wants to know if his Bystolic can be replaced, he said it is so expensive.

## 2020-07-08 NOTE — Telephone Encounter (Signed)
Did he get the drug card?  Also should be generic starting tomorrow.  Do we know where he can get the generic or the cost?

## 2020-07-08 NOTE — Telephone Encounter (Signed)
I spoke with patient.  He reports bystolic will be $014 for a 3 month supply.  He did not pick up prescription due to cost and is asking if there is something cheaper he could take.  He does have samples and will contact us before running out.  I told him we would check into this and then follow up with him.

## 2020-07-09 NOTE — Telephone Encounter (Signed)
He can go online to [email or web address].[email or web address] and click on the "need a card" button.  Should cost him "as little as" $15 for 3 month supply.  Unfortunately when drugs go generic, it takes a few months for the insurance companies to recognize them as generic and lower the price.  So it may be for a few months that the generic is as expensive as the brand.

## 2020-07-09 NOTE — Telephone Encounter (Signed)
Advised patient, verbalized understanding  Patient will call back if this does not help him

## 2020-07-13 NOTE — Telephone Encounter (Signed)
Thank you :)

## 2020-07-14 NOTE — Telephone Encounter (Signed)
Follow up   Pt is calling back, he says he called a couple of days ago regarding the cost of his medication.  He says he was told to go onto a website  He is now asking to speak with the nurse in regards to this   Please call

## 2020-07-14 NOTE — Telephone Encounter (Signed)
Patient states that he downloaded the coupon as advised, however it was still going to cost him $200 for a  3 month supply. He is wondering if there is anything additional he can do to get the price lower.  He states that he has been feeling good since starting on this medication and would really like to continue it but he cannot afford it at that price.

## 2020-07-17 NOTE — Telephone Encounter (Signed)
Card should bring co-pay down to $15 dollar, NOT $200.  After contacting pharmacy, looks like he his insurance is co-pay if $403.95 and co-pay card max at $200.

## 2020-07-22 ENCOUNTER — Other Ambulatory Visit: Payer: Self-pay | Admitting: Physician Assistant

## 2020-07-24 ENCOUNTER — Other Ambulatory Visit: Payer: Self-pay | Admitting: Physician Assistant

## 2020-07-24 ENCOUNTER — Other Ambulatory Visit: Payer: Self-pay

## 2020-07-24 ENCOUNTER — Telehealth: Payer: Self-pay | Admitting: Cardiovascular Disease

## 2020-07-24 MED ORDER — WARFARIN SODIUM 5 MG PO TABS: 5.0000 mg | ORAL_TABLET | Freq: Every day | ORAL | 3 refills | Status: DC

## 2020-07-24 NOTE — Telephone Encounter (Signed)
*  STAT* If patient is at the pharmacy, call can be transferred to refill team.   1. Which medications need to be refilled? (please list name of each medication and dose if known) Warfarin  2. Which pharmacy/location (including street and city if local pharmacy) is medication to be sent to? [facility]   3. Do they need a 30 day or 90 day supply? 90 not sure

## 2020-07-28 ENCOUNTER — Other Ambulatory Visit: Payer: Self-pay

## 2020-07-28 MED ORDER — WARFARIN SODIUM 5 MG PO TABS: 5.0000 mg | ORAL_TABLET | Freq: Every day | ORAL | 1 refills | Status: DC

## 2020-07-29 ENCOUNTER — Ambulatory Visit: Payer: No Typology Code available for payment source

## 2020-07-30 ENCOUNTER — Other Ambulatory Visit: Payer: Self-pay

## 2020-07-30 ENCOUNTER — Ambulatory Visit (INDEPENDENT_AMBULATORY_CARE_PROVIDER_SITE_OTHER): Payer: No Typology Code available for payment source | Admitting: Pharmacist

## 2020-07-30 VITALS — BP 134/78 | HR 78 | Resp 16 | Ht 69.0 in | Wt 182.2 lb

## 2020-07-30 DIAGNOSIS — Z954 Presence of other heart-valve replacement: Secondary | ICD-10-CM | POA: Diagnosis not present

## 2020-07-30 DIAGNOSIS — I10 Essential (primary) hypertension: Secondary | ICD-10-CM | POA: Diagnosis not present

## 2020-07-30 LAB — POCT INR: INR: 2.2 (ref 2.0–3.0)

## 2020-07-30 MED ORDER — LISINOPRIL 10 MG PO TABS: ORAL_TABLET | ORAL | 1 refills | Status: DC

## 2020-07-30 NOTE — Progress Notes (Signed)
Patient ID: [place]                 DOB: 24-Sep-1966                      MRN: [id number]     HPI: [place] is a 54 y.o. male referred by Dr. Duke Salvia to HTN clinic.  Mitral stenosis, mitral regurgitation, TAVR, hypertension, NSVT, PAC/PVC,and tobacco abuse.  Current HTN meds:  Amlodipine 10mg  daily (6am) Lisinopril 10mg  daily(6am) nebivolol 5mg  daily(6am)  BP goal: <130/80  Family History: The patient's family history includes Hypertension in his father and mother; Stroke in his father.   Social History: The patient  reports that he quit smoking about 6 months ago. His smoking use included cigarettes. He has a 35.00 pack-year smoking history. He has never used smokeless tobacco. He reports that he does not drink alcohol and does not use drugs.   Diet: home cooked food, 1-2 cups of coffee per day and tea every day  Exercise:ride bicycle 1-2 per week (~ 1 hour)  Home BP readings:  12 morning readings: average 146/92 (HR range 73-81bpm) 8 evening readings:average 143/86 (HR range 73-80bpm)  Wt Readings from Last 3 Encounters:  07/30/20 182 lb 3.2 oz (82.6 kg)  07/01/20 181 lb 3.2 oz (82.2 kg)  04/27/20 180 lb (81.6 kg)   BP Readings from Last 3 Encounters:  07/30/20 134/78  07/01/20 (!) 176/100  04/27/20 (!) 179/105   Pulse Readings from Last 3 Encounters:  07/30/20 78  07/01/20 75  04/27/20 73    Past Medical History:  Diagnosis Date  . Hypertension   . Incidental pulmonary nodule 01/08/2020   Multiple in right lung - need f/u CT 1 year due to h/o smoking  . Mild aortic stenosis   . Mitral valve stenosis, severe   . Moderate mitral regurgitation   . Pneumonia    x 1  . Rheumatic mitral stenosis with regurgitation    Echo 08/08/16:  Severe mitral stenosis (mean gradient 17 mmHg).  Moderate mitral regurgitation.  . S/P minimally-invasive mitral valve replacement with mechanical valve 03/06/2020   33 mm Sorin Carbomedics Optiform bileaflet  mechanical valve via right mini thoracotomy approach  . Wears glasses   . Wears partial dentures    upper    Current Outpatient Medications on File Prior to Visit  Medication Sig Dispense Refill  . amLODipine (NORVASC) 10 MG tablet Take 1 tablet (10 mg total) by mouth daily. 180 tablet 3  . aspirin EC 81 MG tablet Take 1 tablet (81 mg total) by mouth daily.    . nebivolol (BYSTOLIC) 5 MG tablet Take 1 tablet (5 mg total) by mouth daily. 90 tablet 1  . warfarin (COUMADIN) 5 MG tablet Take 1 tablet (5 mg total) by mouth daily at 4 PM. 30 tablet 1   No current facility-administered medications on file prior to visit.    No Known Allergies  Blood pressure 134/78, pulse 78, resp. rate 16, height 5\' 9"  (1.753 m), weight 182 lb 3.2 oz (82.6 kg), SpO2 97 %.  Essential hypertension Blood pressure remains above goal and patient is tolerating current therapy. Patient is to decrease sodium in diet .  Will increase lisinopril dose to 10mg  in AM and 5mg  in PM (plan increase to 20mg  daily , but patient will prefer slow titration). Follow up BMET in 2 weeks, and increase to 10mg  BID if additional BP control needed during next OV.  Merritt Mccravy Rodriguez-Guzman PharmD, BCPS, CPP [facility] [address] [place] [id number] 08/09/2020 7:28 PM

## 2020-07-30 NOTE — Patient Instructions (Addendum)
Return for a  follow up appointment in 2 weeks (will check BP during coumadin clinic appointment, then 6 weeks for HTN follow up)  Check your blood pressure at home daily (if able) and keep record of the readings.  Take your BP meds as follows: *INCREASE lisinopril dose to 10mg  in the morning and 5mg  in the evening*  Bring all of your meds, your BP cuff and your record of home blood pressures to your next appointment.  Exercise as you're able, try to walk approximately 30 minutes per day.  Keep salt intake to a minimum, especially watch canned and prepared boxed foods.  Eat more fresh fruits and vegetables and fewer canned items.  Avoid eating in fast food restaurants.    HOW TO TAKE YOUR BLOOD PRESSURE: . Rest 5 minutes before taking your blood pressure. .  Don't smoke or drink caffeinated beverages for at least 30 minutes before. . Take your blood pressure before (not after) you eat. . Sit comfortably with your back supported and both feet on the floor (don't cross your legs). . Elevate your arm to heart level on a table or a desk. . Use the proper sized cuff. It should fit smoothly and snugly around your bare upper arm. There should be enough room to slip a fingertip under the cuff. The bottom edge of the cuff should be 1 inch above the crease of the elbow. . Ideally, take 3 measurements at one sitting and record the average.

## 2020-08-09 ENCOUNTER — Encounter: Payer: Self-pay | Admitting: Pharmacist

## 2020-08-09 NOTE — Assessment & Plan Note (Signed)
Blood pressure remains above goal and patient is tolerating current therapy. Patient is to decrease sodium in diet .  Will increase lisinopril dose to 10mg  in AM and 5mg  in PM (plan increase to 20mg  daily , but patient will prefer slow titration). Follow up BMET in 2 weeks, and increase to 10mg  BID if additional BP control needed during next OV.

## 2020-08-14 ENCOUNTER — Ambulatory Visit (INDEPENDENT_AMBULATORY_CARE_PROVIDER_SITE_OTHER): Payer: No Typology Code available for payment source

## 2020-08-14 ENCOUNTER — Other Ambulatory Visit: Payer: Self-pay

## 2020-08-14 DIAGNOSIS — Z5181 Encounter for therapeutic drug level monitoring: Secondary | ICD-10-CM

## 2020-08-14 DIAGNOSIS — Z954 Presence of other heart-valve replacement: Secondary | ICD-10-CM

## 2020-08-14 LAB — POCT INR: INR: 2.1 (ref 2.0–3.0)

## 2020-08-14 NOTE — Patient Instructions (Signed)
Take 2 tablets today and then continue taking 1 tablet (5mg ) every day.  Next INR in 3 weeks. BP 124/70

## 2020-08-15 LAB — BASIC METABOLIC PANEL
BUN/Creatinine Ratio: 13 (ref 9–20)
BUN: 14 mg/dL (ref 6–24)
CO2: 26 mmol/L (ref 20–29)
Calcium: 9.8 mg/dL (ref 8.7–10.2)
Chloride: 101 mmol/L (ref 96–106)
Creatinine, Ser: 1.11 mg/dL (ref 0.76–1.27)
GFR calc Af Amer: 85 mL/min/{1.73_m2} (ref >59)
GFR calc non Af Amer: 74 mL/min/{1.73_m2} (ref >59)
Glucose: 72 mg/dL (ref 65–99)
Potassium: 3.6 mmol/L (ref 3.5–5.2)
Sodium: 137 mmol/L (ref 134–144)

## 2020-08-31 ENCOUNTER — Ambulatory Visit: Payer: No Typology Code available for payment source

## 2020-09-02 ENCOUNTER — Other Ambulatory Visit: Payer: Self-pay

## 2020-09-02 ENCOUNTER — Ambulatory Visit (INDEPENDENT_AMBULATORY_CARE_PROVIDER_SITE_OTHER): Payer: No Typology Code available for payment source

## 2020-09-02 DIAGNOSIS — Z5181 Encounter for therapeutic drug level monitoring: Secondary | ICD-10-CM | POA: Diagnosis not present

## 2020-09-02 DIAGNOSIS — Z954 Presence of other heart-valve replacement: Secondary | ICD-10-CM | POA: Diagnosis not present

## 2020-09-02 LAB — POCT INR: INR: 1.8 — AB (ref 2.0–3.0)

## 2020-09-02 NOTE — Patient Instructions (Signed)
Take 2 tablets today and then increase to 1 tablet every day except 1.5 tablets every Monday and Friday.  Next INR in 4 weeks.

## 2020-09-15 ENCOUNTER — Telehealth: Payer: Self-pay

## 2020-09-15 NOTE — Telephone Encounter (Signed)
-----   Message from Purcell Nails, MD sent at 09/03/2020  8:42 AM EST ----- Regarding: RE: Question from patient Please tell him it is quite common but it has nothing to do with his mitral valve disease nor his surgery.  As you stated he should discuss with his PCP and/or request to be referred to a urologist ----- Message ----- From: Joycelyn Schmid, LPN Sent: [id number]   3:43 PM EST To: Purcell Nails, MD Subject: Question from patient                          Mr Gulyas is calling about a new problem . "ED"  he states that he is getting married next year and now has this issue.  He was questioning that the cause might be from his mini MVR he had done back on 03/06/20. I did tell hm this was a medical issue and he would need to discuss with his PCP. But he was still wanting your opinion. Please advise SW

## 2020-09-16 ENCOUNTER — Ambulatory Visit: Payer: No Typology Code available for payment source

## 2020-10-08 ENCOUNTER — Other Ambulatory Visit: Payer: Self-pay

## 2020-10-08 ENCOUNTER — Ambulatory Visit (INDEPENDENT_AMBULATORY_CARE_PROVIDER_SITE_OTHER): Payer: 59 | Admitting: Pharmacist Clinician (PhC)/ Clinical Pharmacy Specialist

## 2020-10-08 DIAGNOSIS — Z954 Presence of other heart-valve replacement: Secondary | ICD-10-CM

## 2020-10-08 DIAGNOSIS — I10 Essential (primary) hypertension: Secondary | ICD-10-CM | POA: Diagnosis not present

## 2020-10-08 LAB — POCT INR: INR: 1.9 — AB (ref 2.0–3.0)

## 2020-10-08 MED ORDER — REPATHA SURECLICK 140 MG/ML ~~LOC~~ SOAJ: 140.0000 mg | SUBCUTANEOUS | 0 refills | Status: DC

## 2020-10-08 NOTE — Progress Notes (Signed)
Patient ID: Melvin Burns                 DOB: [birth year]                      MRN: [id number]     HPI: Melvin Burns is a 54 y.o. male referred by Dr. Duke Salvia to HTN clinic.  Mitral stenosis, mitral regurgitation, TAVR, hypertension, NSVT, PAC/PVC,and tobacco abuse.  At his last visit in October his office pressure was 134/78 and home readings averaged in the 145/90 range.  His lisinopril was increased to 10 mg each morning and 5 mg each night.  A metabolic panel drawn after 2 weeks showed about a 20% increase in serum creatinine, from 0.9 to 1.1.    He returns today for follow up.  At his last visit he was told to add 5 mg of lisinopril in the evenings.  Because he takes all other medications except warfarin in the mornings, he admits he forgot to do this and has continued with just the previous 10 mg daily dose.  He also recently spent 2 weeks in [place].  He did not check his blood pressure while travelling, but did note that for the first day or two after getting home his pressure was <120 systolic.    He attributes this to more natural and fresh foods, as well as a lack of sodium and preservatives.     Current HTN meds:  Amlodipine 10mg  daily (6am) Lisinopril 10mg  daily(6am)  nebivolol 5mg  daily(6am)  BP goal: <130/80  Family History: The patient's family history includes Hypertension in his father and mother; Stroke in his father.   Social History: The patient  reports that he quit smoking about 6 months ago. His smoking use included cigarettes. He has a 35.00 pack-year smoking history. He has never used smokeless tobacco. He reports that he does not drink alcohol and does not use drugs.   Diet: home cooked food, 1-2 cups of coffee per day and tea every day  Exercise: ride bicycle 1-2 per week (~ 1 hour)  Home BP readings: did not check home BP much in the past month - was in for several weeks; only 2 readings since return - 115/73 and 135/79   Wt  Readings from Last 3 Encounters:  10/08/20 184 lb 8 oz (83.7 kg)  07/30/20 182 lb 3.2 oz (82.6 kg)  07/01/20 181 lb 3.2 oz (82.2 kg)   BP Readings from Last 3 Encounters:  10/08/20 130/68  07/30/20 134/78  07/01/20 (!) 176/100   Pulse Readings from Last 3 Encounters:  10/08/20 72  07/30/20 78  07/01/20 75    Past Medical History:  Diagnosis Date  . Hypertension   . Incidental pulmonary nodule 01/08/2020   Multiple in right lung - need f/u CT 1 year due to h/o smoking  . Mild aortic stenosis   . Mitral valve stenosis, severe   . Moderate mitral regurgitation   . Pneumonia    x 1  . Rheumatic mitral stenosis with regurgitation    Echo 08/08/16:  Severe mitral stenosis (mean gradient 17 mmHg).  Moderate mitral regurgitation.  . 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 Wears glasses   . Wears partial dentures    upper    Current Outpatient Medications on File Prior to Visit  Medication Sig  Dispense Refill  . amLODipine (NORVASC) 10 MG tablet Take 1 tablet (10 mg total) by mouth daily. 180 tablet 3  . aspirin EC 81 MG tablet Take 1 tablet (81 mg total) by mouth daily.    Marland Kitchen lisinopril (ZESTRIL) 10 MG tablet Take 1 tablet (10 mg total) by mouth in the morning AND 0.5 tablets (5 mg total) every evening. 45 tablet 1  . nebivolol (BYSTOLIC) 5 MG tablet Take 1 tablet (5 mg total) by mouth daily. 90 tablet 1  . warfarin (COUMADIN) 5 MG tablet Take 1 tablet (5 mg total) by mouth daily at 4 PM. 30 tablet 1   Burns current facility-administered medications on file prior to visit.    Burns Known Allergies  Blood pressure 130/68, pulse 72, height 5\' 9"  (1.753 m), weight 184 lb 8 oz (83.7 kg).  Essential hypertension Patient with essential hypertension, currently taking all meds in the mornings.  Will have him move lisinopril dose to evenings, when he  takes his warfarin.  Discussed how dividing medications can often help lower pressure by 3-5 points systolic.  Patient agreeable to this - will work harder to make sure he is taking meds correctly.  We will see him back in 4-6 weeks for follow up.  He has an INR check in 2 weeks, and at that time will make his BP f/u for the same day as his return INR.  Patient agreeable to plan.   PharmD CPP [facility] [address] [place] 10/08/2020 11:12 AM

## 2020-10-08 NOTE — Assessment & Plan Note (Signed)
Patient with essential hypertension, currently taking all meds in the mornings.  Will have him move lisinopril dose to evenings, when he takes his warfarin.  Discussed how dividing medications can often help lower pressure by 3-5 points systolic.  Patient agreeable to this - will work harder to make sure he is taking meds correctly.  We will see him back in 4-6 weeks for follow up.  He has an INR check in 2 weeks, and at that time will make his BP f/u for the same day as his return INR.  Patient agreeable to plan.

## 2020-10-08 NOTE — Patient Instructions (Signed)
Return for a a follow up appointment in 6 weeks  Your blood pressure today is 130/68  Check your blood pressure at home daily and keep record of the readings.  Take your BP meds as follows:  AM: aspirin, amlodipine and nebivolol (Bystolic) - take before 1st shift or when home after 3rd shift  PM: lisinopril and warfarin - take when home after 1st shift or before 3rd shift  Bring all of your meds, your BP cuff and your record of home blood pressures to your next appointment.  Exercise as you're able, try to walk approximately 30 minutes per day.  Keep salt intake to a minimum, especially watch canned and prepared boxed foods.  Eat more fresh fruits and vegetables and fewer canned items.  Avoid eating in fast food restaurants.    HOW TO TAKE YOUR BLOOD PRESSURE: . Rest 5 minutes before taking your blood pressure. .  Don't smoke or drink caffeinated beverages for at least 30 minutes before. . Take your blood pressure before (not after) you eat. . Sit comfortably with your back supported and both feet on the floor (don't cross your legs). . Elevate your arm to heart level on a table or a desk. . Use the proper sized cuff. It should fit smoothly and snugly around your bare upper arm. There should be enough room to slip a fingertip under the cuff. The bottom edge of the cuff should be 1 inch above the crease of the elbow. . Ideally, take 3 measurements at one sitting and record the average.

## 2020-10-23 ENCOUNTER — Ambulatory Visit (INDEPENDENT_AMBULATORY_CARE_PROVIDER_SITE_OTHER): Payer: 59

## 2020-10-23 ENCOUNTER — Other Ambulatory Visit: Payer: Self-pay

## 2020-10-23 ENCOUNTER — Telehealth: Payer: Self-pay | Admitting: Pharmacist Clinician (PhC)/ Clinical Pharmacy Specialist

## 2020-10-23 DIAGNOSIS — Z954 Presence of other heart-valve replacement: Secondary | ICD-10-CM | POA: Diagnosis not present

## 2020-10-23 DIAGNOSIS — Z5181 Encounter for therapeutic drug level monitoring: Secondary | ICD-10-CM

## 2020-10-23 LAB — POCT INR: INR: 3.3 — AB (ref 2.0–3.0)

## 2020-10-23 NOTE — Patient Instructions (Signed)
Continue taking 1.5 tablets every day except 1 tablet every Tuesday, Thursday and Saturday  Next INR in 6 weeks.

## 2020-10-23 NOTE — Telephone Encounter (Signed)
Patient brought in list of home BP readings.  Averaging now at 138/82.  He notes he is feeling well, and has some readings in the 140's systolic mostly on days he is not very active.  He will continue to monitor at home

## 2020-12-02 ENCOUNTER — Other Ambulatory Visit: Payer: Self-pay

## 2020-12-02 ENCOUNTER — Ambulatory Visit (INDEPENDENT_AMBULATORY_CARE_PROVIDER_SITE_OTHER): Payer: 59

## 2020-12-02 DIAGNOSIS — Z5181 Encounter for therapeutic drug level monitoring: Secondary | ICD-10-CM

## 2020-12-02 DIAGNOSIS — Z954 Presence of other heart-valve replacement: Secondary | ICD-10-CM

## 2020-12-02 LAB — POCT INR: INR: 3.9 — AB (ref 2.0–3.0)

## 2020-12-02 NOTE — Patient Instructions (Signed)
Hold tonight only and then Continue taking 1.5 tablets every day except 1 tablet every Tuesday, Thursday and Saturday  Next INR in 3 weeks.

## 2020-12-04 ENCOUNTER — Encounter (HOSPITAL_COMMUNITY): Payer: Self-pay | Admitting: Emergency Medicine

## 2020-12-04 ENCOUNTER — Emergency Department (HOSPITAL_COMMUNITY): Payer: 59

## 2020-12-04 ENCOUNTER — Inpatient Hospital Stay (HOSPITAL_COMMUNITY)
Admission: EM | Admit: 2020-12-04 | Discharge: 2020-12-07 | DRG: 177 | Disposition: A | Discharge status: Home / Self Care | Payer: 59 | Attending: Internal Medicine | Admitting: Internal Medicine

## 2020-12-04 ENCOUNTER — Other Ambulatory Visit: Payer: Self-pay

## 2020-12-04 ENCOUNTER — Inpatient Hospital Stay (HOSPITAL_COMMUNITY): Payer: 59

## 2020-12-04 DIAGNOSIS — Z7901 Long term (current) use of anticoagulants: Secondary | ICD-10-CM

## 2020-12-04 DIAGNOSIS — Z952 Presence of prosthetic heart valve: Secondary | ICD-10-CM

## 2020-12-04 DIAGNOSIS — I480 Paroxysmal atrial fibrillation: Secondary | ICD-10-CM | POA: Diagnosis present

## 2020-12-04 DIAGNOSIS — I110 Hypertensive heart disease with heart failure: Secondary | ICD-10-CM | POA: Diagnosis present

## 2020-12-04 DIAGNOSIS — J811 Chronic pulmonary edema: Secondary | ICD-10-CM

## 2020-12-04 DIAGNOSIS — Z7982 Long term (current) use of aspirin: Secondary | ICD-10-CM | POA: Diagnosis not present

## 2020-12-04 DIAGNOSIS — I5022 Chronic systolic (congestive) heart failure: Secondary | ICD-10-CM | POA: Diagnosis present

## 2020-12-04 DIAGNOSIS — Z87891 Personal history of nicotine dependence: Secondary | ICD-10-CM

## 2020-12-04 DIAGNOSIS — Z954 Presence of other heart-valve replacement: Secondary | ICD-10-CM | POA: Diagnosis not present

## 2020-12-04 DIAGNOSIS — U071 COVID-19: Principal | ICD-10-CM

## 2020-12-04 DIAGNOSIS — I10 Essential (primary) hypertension: Secondary | ICD-10-CM | POA: Diagnosis not present

## 2020-12-04 DIAGNOSIS — Z8249 Family history of ischemic heart disease and other diseases of the circulatory system: Secondary | ICD-10-CM

## 2020-12-04 DIAGNOSIS — Z79899 Other long term (current) drug therapy: Secondary | ICD-10-CM | POA: Diagnosis not present

## 2020-12-04 DIAGNOSIS — D649 Anemia, unspecified: Secondary | ICD-10-CM | POA: Diagnosis present

## 2020-12-04 DIAGNOSIS — I509 Heart failure, unspecified: Secondary | ICD-10-CM

## 2020-12-04 DIAGNOSIS — J9601 Acute respiratory failure with hypoxia: Secondary | ICD-10-CM | POA: Diagnosis present

## 2020-12-04 DIAGNOSIS — I5023 Acute on chronic systolic (congestive) heart failure: Secondary | ICD-10-CM | POA: Diagnosis not present

## 2020-12-04 DIAGNOSIS — D72829 Elevated white blood cell count, unspecified: Secondary | ICD-10-CM | POA: Diagnosis present

## 2020-12-04 LAB — LACTIC ACID, PLASMA
Lactic Acid, Venous: 2.5 mmol/L (ref 0.5–1.9)
Lactic Acid, Venous: 1.6 mmol/L (ref 0.5–1.9)
Lactic Acid, Venous: 2.9 mmol/L (ref 0.5–1.9)
Lactic Acid, Venous: 2.7 mmol/L (ref 0.5–1.9)

## 2020-12-04 LAB — COMPREHENSIVE METABOLIC PANEL
ALT: 12 U/L (ref 0–44)
AST: 32 U/L (ref 15–41)
Albumin: 3.9 g/dL (ref 3.5–5.0)
Alkaline Phosphatase: 77 U/L (ref 38–126)
Anion gap: 10 (ref 5–15)
BUN: 9 mg/dL (ref 6–20)
CO2: 23 mmol/L (ref 22–32)
Calcium: 9.1 mg/dL (ref 8.9–10.3)
Chloride: 104 mmol/L (ref 98–111)
Creatinine, Ser: 0.88 mg/dL (ref 0.61–1.24)
GFR, Estimated: >60 mL/min (ref >60)
Glucose, Bld: 118 mg/dL — ABNORMAL HIGH (ref 70–99)
Potassium: 4 mmol/L (ref 3.5–5.1)
Sodium: 137 mmol/L (ref 135–145)
Total Bilirubin: 0.9 mg/dL (ref 0.3–1.2)
Total Protein: 8.7 g/dL — ABNORMAL HIGH (ref 6.5–8.1)

## 2020-12-04 LAB — CBC WITH DIFFERENTIAL/PLATELET
Abs Immature Granulocytes: 0.15 10*3/uL — ABNORMAL HIGH (ref 0.00–0.07)
Basophils Absolute: 0.2 10*3/uL — ABNORMAL HIGH (ref 0.0–0.1)
Basophils Relative: 1 %
Eosinophils Absolute: 0.4 10*3/uL (ref 0.0–0.5)
Eosinophils Relative: 2 %
HCT: 33 % — ABNORMAL LOW (ref 39.0–52.0)
Hemoglobin: 10.2 g/dL — ABNORMAL LOW (ref 13.0–17.0)
Immature Granulocytes: 1 %
Lymphocytes Relative: 10 %
Lymphs Abs: 2.2 10*3/uL (ref 0.7–4.0)
MCH: 27.3 pg (ref 26.0–34.0)
MCHC: 30.9 g/dL (ref 30.0–36.0)
MCV: 88.5 fL (ref 80.0–100.0)
Monocytes Absolute: 1.6 10*3/uL — ABNORMAL HIGH (ref 0.1–1.0)
Monocytes Relative: 8 %
Neutro Abs: 17.2 10*3/uL — ABNORMAL HIGH (ref 1.7–7.7)
Neutrophils Relative %: 78 %
Platelets: 356 10*3/uL (ref 150–400)
RBC: 3.73 MIL/uL — ABNORMAL LOW (ref 4.22–5.81)
RDW: 14.6 % (ref 11.5–15.5)
WBC: 21.7 10*3/uL — ABNORMAL HIGH (ref 4.0–10.5)
nRBC: 0 % (ref 0.0–0.2)

## 2020-12-04 LAB — ECHOCARDIOGRAM COMPLETE
AR max vel: 1.2 cm2
AV Area VTI: 1.27 cm2
AV Area mean vel: 1.26 cm2
AV Mean grad: 11 mmHg
AV Peak grad: 20.5 mmHg
Ao pk vel: 2.27 m/s
Area-P 1/2: 3.17 cm2
Height: 69 in
MV VTI: 1.12 cm2
P 1/2 time: 359 msec
S' Lateral: 3.8 cm
Weight: 2880 oz

## 2020-12-04 LAB — URINALYSIS, ROUTINE W REFLEX MICROSCOPIC
Bacteria, UA: NONE SEEN
Bilirubin Urine: NEGATIVE
Glucose, UA: NEGATIVE mg/dL
Ketones, ur: NEGATIVE mg/dL
Leukocytes,Ua: NEGATIVE
Nitrite: NEGATIVE
Protein, ur: 30 mg/dL — AB
Specific Gravity, Urine: 1.012 (ref 1.005–1.030)
pH: 5 (ref 5.0–8.0)

## 2020-12-04 LAB — BRAIN NATRIURETIC PEPTIDE: B Natriuretic Peptide: 180.9 pg/mL — ABNORMAL HIGH (ref 0.0–100.0)

## 2020-12-04 LAB — LACTATE DEHYDROGENASE: LDH: 535 U/L — ABNORMAL HIGH (ref 98–192)

## 2020-12-04 LAB — FERRITIN: Ferritin: 110 ng/mL (ref 24–336)

## 2020-12-04 LAB — C-REACTIVE PROTEIN: CRP: 6.5 mg/dL — ABNORMAL HIGH (ref <1.0)

## 2020-12-04 LAB — PROTIME-INR
INR: 2.3 — ABNORMAL HIGH (ref 0.8–1.2)
Prothrombin Time: 24.6 seconds — ABNORMAL HIGH (ref 11.4–15.2)

## 2020-12-04 LAB — APTT: aPTT: 48 seconds — ABNORMAL HIGH (ref 24–36)

## 2020-12-04 LAB — HIV ANTIBODY (ROUTINE TESTING W REFLEX): HIV Screen 4th Generation wRfx: NONREACTIVE

## 2020-12-04 LAB — FIBRINOGEN: Fibrinogen: 490 mg/dL — ABNORMAL HIGH (ref 210–475)

## 2020-12-04 LAB — D-DIMER, QUANTITATIVE: D-Dimer, Quant: 1.67 ug/mL-FEU — ABNORMAL HIGH (ref 0.00–0.50)

## 2020-12-04 LAB — TRIGLYCERIDES: Triglycerides: 130 mg/dL (ref <150)

## 2020-12-04 LAB — PROCALCITONIN: Procalcitonin: <0.1 ng/mL

## 2020-12-04 LAB — RESP PANEL BY RT-PCR (FLU A&B, COVID) ARPGX2
Influenza A by PCR: NEGATIVE
Influenza B by PCR: NEGATIVE
SARS Coronavirus 2 by RT PCR: POSITIVE — AB

## 2020-12-04 LAB — TROPONIN I (HIGH SENSITIVITY)
Troponin I (High Sensitivity): 13 ng/L (ref <18)
Troponin I (High Sensitivity): 14 ng/L (ref <18)

## 2020-12-04 MED ORDER — WARFARIN - PHARMACIST DOSING INPATIENT: Freq: Every day | Status: DC

## 2020-12-04 MED ORDER — SODIUM CHLORIDE 0.9 % IV SOLN
200.0000 mg | Freq: Once | INTRAVENOUS | Status: AC
  Administered 2020-12-04: 200 mg via INTRAVENOUS
  Filled 2020-12-04: qty 200

## 2020-12-04 MED ORDER — ACETAMINOPHEN 325 MG PO TABS
650.0000 mg | ORAL_TABLET | Freq: Four times a day (QID) | ORAL | Status: DC | PRN
  Administered 2020-12-04: 650 mg via ORAL
  Filled 2020-12-04: qty 2

## 2020-12-04 MED ORDER — SODIUM CHLORIDE 0.9% FLUSH
3.0000 mL | Freq: Two times a day (BID) | INTRAVENOUS | Status: DC
  Administered 2020-12-04 – 2020-12-07 (×7): 3 mL via INTRAVENOUS

## 2020-12-04 MED ORDER — ASPIRIN EC 81 MG PO TBEC
81.0000 mg | DELAYED_RELEASE_TABLET | Freq: Every day | ORAL | Status: DC
  Administered 2020-12-04 – 2020-12-07 (×4): 81 mg via ORAL
  Filled 2020-12-04 (×4): qty 1

## 2020-12-04 MED ORDER — AMLODIPINE BESYLATE 10 MG PO TABS
10.0000 mg | ORAL_TABLET | Freq: Every day | ORAL | Status: DC
  Administered 2020-12-04 – 2020-12-07 (×3): 10 mg via ORAL
  Filled 2020-12-04: qty 1
  Filled 2020-12-04: qty 2
  Filled 2020-12-04 (×2): qty 1

## 2020-12-04 MED ORDER — HYDRALAZINE HCL 20 MG/ML IJ SOLN: 10.0000 mg | Freq: Three times a day (TID) | INTRAMUSCULAR | Status: DC | PRN

## 2020-12-04 MED ORDER — IPRATROPIUM-ALBUTEROL 20-100 MCG/ACT IN AERS
1.0000 | INHALATION_SPRAY | Freq: Four times a day (QID) | RESPIRATORY_TRACT | Status: DC | PRN
  Filled 2020-12-04: qty 4

## 2020-12-04 MED ORDER — NEBIVOLOL HCL 5 MG PO TABS
5.0000 mg | ORAL_TABLET | Freq: Every day | ORAL | Status: DC
  Administered 2020-12-04 – 2020-12-07 (×3): 5 mg via ORAL
  Filled 2020-12-04 (×4): qty 1

## 2020-12-04 MED ORDER — PREDNISONE 20 MG PO TABS
50.0000 mg | ORAL_TABLET | Freq: Every day | ORAL | Status: DC
  Administered 2020-12-07: 50 mg via ORAL
  Filled 2020-12-04: qty 2

## 2020-12-04 MED ORDER — FUROSEMIDE 10 MG/ML IJ SOLN
20.0000 mg | Freq: Two times a day (BID) | INTRAMUSCULAR | Status: DC
  Administered 2020-12-04 – 2020-12-07 (×7): 20 mg via INTRAVENOUS
  Filled 2020-12-04 (×7): qty 2
  Filled 2020-12-04: qty 4

## 2020-12-04 MED ORDER — WARFARIN SODIUM 7.5 MG PO TABS
7.5000 mg | ORAL_TABLET | Freq: Once | ORAL | Status: AC
  Administered 2020-12-04: 7.5 mg via ORAL
  Filled 2020-12-04: qty 1

## 2020-12-04 MED ORDER — ACETAMINOPHEN 650 MG RE SUPP: 650.0000 mg | Freq: Four times a day (QID) | RECTAL | Status: DC | PRN

## 2020-12-04 MED ORDER — METHYLPREDNISOLONE SODIUM SUCC 125 MG IJ SOLR
0.5000 mg/kg | Freq: Two times a day (BID) | INTRAMUSCULAR | Status: AC
  Administered 2020-12-04 – 2020-12-06 (×6): 40.625 mg via INTRAVENOUS
  Filled 2020-12-04 (×6): qty 2

## 2020-12-04 MED ORDER — SODIUM CHLORIDE 0.9 % IV SOLN
100.0000 mg | Freq: Every day | INTRAVENOUS | Status: DC
  Administered 2020-12-05 – 2020-12-07 (×3): 100 mg via INTRAVENOUS
  Filled 2020-12-04 (×3): qty 20

## 2020-12-04 MED ORDER — ALBUTEROL SULFATE HFA 108 (90 BASE) MCG/ACT IN AERS
4.0000 | INHALATION_SPRAY | Freq: Once | RESPIRATORY_TRACT | Status: AC
  Administered 2020-12-04: 4 via RESPIRATORY_TRACT
  Filled 2020-12-04: qty 6.7

## 2020-12-04 MED ORDER — POLYETHYLENE GLYCOL 3350 17 G PO PACK: 17.0000 g | PACK | Freq: Every day | ORAL | Status: DC | PRN

## 2020-12-04 MED ORDER — FUROSEMIDE 10 MG/ML IJ SOLN
40.0000 mg | Freq: Once | INTRAMUSCULAR | Status: AC
  Administered 2020-12-04: 40 mg via INTRAVENOUS
  Filled 2020-12-04: qty 4

## 2020-12-04 MED ORDER — AEROCHAMBER Z-STAT PLUS/MEDIUM MISC
Status: AC
  Filled 2020-12-04: qty 1

## 2020-12-04 NOTE — ED Notes (Signed)
Pt given breakfast tray

## 2020-12-04 NOTE — Progress Notes (Signed)
Pt arrived to 5 west  room 1534 around 2pm Vitals WDL. He was oriented to the unit.

## 2020-12-04 NOTE — ED Notes (Signed)
Resoiratory Kathlene November called for RT eval and wheeze protocol

## 2020-12-04 NOTE — ED Notes (Addendum)
Disregard blank note

## 2020-12-04 NOTE — Progress Notes (Signed)
ANTICOAGULATION CONSULT NOTE - Initial Consult  Pharmacy Consult for warfarin Indication: MVR INR goal 2.5-3.5  No Known Allergies  Patient Measurements: Height: 5\' 9"  (175.3 cm) Weight: 81.6 kg (180 lb) IBW/kg (Calculated) : 70.7  Vital Signs: Temp: 98.7 F (37.1 C) (02/25 0222) Temp Source: Oral (02/25 0222) BP: 132/81 (02/25 0630) Pulse Rate: 96 (02/25 0615)  Labs: Recent Labs    12/02/20 1319 12/04/20 0247 12/04/20 0617  HGB  --  10.2*  --   HCT  --  33.0*  --   PLT  --  356  --   APTT  --  48*  --   LABPROT  --  24.6*  --   INR 3.9* 2.3*  --   CREATININE  --  0.88  --   TROPONINIHS  --  13 14    Estimated Creatinine Clearance: 93.7 mL/min (by C-G formula based on SCr of 0.88 mg/dL).   Medical History: Past Medical History:  Diagnosis Date  . Hypertension   . Incidental pulmonary nodule 01/08/2020   Multiple in right lung - need f/u CT 1 year due to h/o smoking  . Mild aortic stenosis   . Mitral valve stenosis, severe   . Moderate mitral regurgitation   . Pneumonia    x 1  . Rheumatic mitral stenosis with regurgitation    Echo 08/08/16:  Severe mitral stenosis (mean gradient 17 mmHg).  Moderate mitral regurgitation.  . 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 Wears glasses   . Wears partial dentures    upper    Medications:  Scheduled:  . amLODipine  10 mg Oral Daily  . aspirin EC  81 mg Oral Daily  . furosemide  20 mg Intravenous BID  . methylPREDNISolone (SOLU-MEDROL) injection  0.5 mg/kg Intravenous Q12H   Followed by  . [START ON 12/07/2020] predniSONE  50 mg Oral Daily  . nebivolol  5 mg Oral Daily  . sodium chloride flush  3 mL Intravenous Q12H   Infusions:  . remdesivir 200 mg in sodium chloride 0.9% 250 mL IVPB     Followed by  . [START ON 12/05/2020] remdesivir 100 mg in NS 100 mL       Assessment: 55 yo male admitted with COVID to continue warfarin inpatient for hx MVR followed by anticoag clinic as outpatient. Dosing per clinic note on 2/23 should be 7.5mg  daily except 5mg  on TuThSat.   INR just below goal at 2.3  Hgb 10.2, Plts 356  Goal of Therapy:  INR 2.5-3.5   Plan:   Will give 7.5mg  dose due to INR below goal today  Daily INR and CBC  3/23 12/04/2020,8:11 AM

## 2020-12-04 NOTE — ED Notes (Signed)
Pt to X-Ray via stretcher 

## 2020-12-04 NOTE — H&P (Addendum)
History and Physical        Hospital Admission Note Date: 12/04/2020  Patient name: Melvin Burns. [place] Medical record number: [id number] Date of birth: [birth year] Age: 55 y.o. Gender: male  PCP: Chilton Si, MD   Chief Complaint    Chief Complaint  Patient presents with  . Shortness of Breath      HPI:   This is 55 year old male who has been vaccinated x 3 against COVID 19 with a history of hypertension, rheumatic heart disease s/p mechanical mitral valve, paroxysmal atrial fibrillation on coumadin and aspirin, tobacco abuse, moderate mitral regurgitation, mild aortic stenosis who presented to the ED with complaints of shortness of breath. Was in his usual state of health until he noticed he had dyspnea on exertion at work. Last night he had sudden onset orthopnea when he went to bed which is unusual for him and prompted him to come to the ED. Admits to change in taste over the past week and decreased appetite. Denies chest pain, palpitations, weight gain, lower extremity edema, myalgias, fever. No known sick contacts. Quit tobacco use 1 year ago. Feeling better since getting lasix in the ED.   ED Course: Afebrile, tachycardic, tachypneic,  hypoxic (SpO2 81% on room air) placed on 4 L/min. Notable Labs: Na 137, K 4.0, BUN 9, Cr 0.88, BNP 180, Lactic acid 2.5, WBC 21 (chronically elevated), INR 2.3, D dimer 1.67, LDH 535, COVID 19 positive. Notable Imaging: CXR - consistent with CHF with interstitial and alveolar edema. Patient received albuterol, lasix.    Vitals:   12/04/20 0615 12/04/20 0630  BP: 120/79 132/81  Pulse: 96   Resp: 20 (!) 22  Temp:    SpO2: 100%      Review of Systems:  Review of Systems  All other systems reviewed and are negative.   Medical/Social/Family History   Past Medical History: Past Medical History:  Diagnosis Date  . Hypertension    . Incidental pulmonary nodule 01/08/2020   Multiple in right lung - need f/u CT 1 year due to h/o smoking  . Mild aortic stenosis   . Mitral valve stenosis, severe   . Moderate mitral regurgitation   . Pneumonia    x 1  . Rheumatic mitral stenosis with regurgitation    Echo 08/08/16:  Severe mitral stenosis (mean gradient 17 mmHg).  Moderate mitral regurgitation.  . 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 Wears glasses   . Wears partial dentures    upper    Past Surgical History:  Procedure Laterality Date  . BILATERAL KNEE ARTHROSCOPY    . BUBBLE STUDY  12/16/2019   Procedure: BUBBLE STUDY;  Surgeon: [name], MD;  Location: [facility] ENDOSCOPY;  Service: Cardiology;;  . MITRAL VALVE REPLACEMENT Right 03/06/2020   Procedure: MINIMALLY INVASIVE MITRAL VALVE (MV) REPLACEMENT using Carbomedics Optiform 33 MM Mitral Bioprosthetic Valve.;  Surgeon: Purcell Nails, MD;  Location: [facility] OR;  Service: Open Heart Surgery;  Laterality: Right;  . MULTIPLE EXTRACTIONS WITH ALVEOLOPLASTY N/A 12/31/2019   Procedure: Extraction of tooth #'s 16-17, 20-23, and 28-30 with alveoloplasty,  Bilateral mandibular tori reductions, and lateral exostoses reductions.;  Surgeon: Charlynne Pander, DDS;  Location: [facility] OR;  Service: Oral Surgery;  Laterality: N/A;  . RIGHT/LEFT HEART CATH AND CORONARY ANGIOGRAPHY N/A 12/16/2019   Procedure: RIGHT/LEFT HEART CATH AND CORONARY ANGIOGRAPHY;  Surgeon: Yvonne Kendall, MD;  Location: [facility] INVASIVE CV LAB;  Service: Cardiovascular;  Laterality: N/A;  . TEE WITHOUT CARDIOVERSION N/A 12/16/2019   Procedure: TRANSESOPHAGEAL ECHOCARDIOGRAM (TEE);  Surgeon: [name], MD;  Location: [facility] ENDOSCOPY;  Service: Cardiology;  Laterality: N/A;  . TEE WITHOUT CARDIOVERSION N/A 03/06/2020   Procedure: TRANSESOPHAGEAL ECHOCARDIOGRAM (TEE);   Surgeon: Purcell Nails, MD;  Location: [facility] OR;  Service: Open Heart Surgery;  Laterality: N/A;    Medications: Prior to Admission medications   Medication Sig Start Date End Date Taking? Authorizing Provider  amLODipine (NORVASC) 10 MG tablet Take 1 tablet (10 mg total) by mouth daily. 07/01/20 09/29/20  Chilton Si, MD  aspirin EC 81 MG tablet Take 1 tablet (81 mg total) by mouth daily. 12/16/19   End, Cristal Deer, MD  Evolocumab (REPATHA SURECLICK) 140 MG/ML SOAJ Inject 140 mg into the skin every 14 (fourteen) days. 10/08/20   Chilton Si, MD  lisinopril (ZESTRIL) 10 MG tablet Take 1 tablet (10 mg total) by mouth in the morning AND 0.5 tablets (5 mg total) every evening. Patient taking differently: Take 1 tablet (10 mg total) by mouth daily - in the evenings 07/30/20   Chilton Si, MD  nebivolol (BYSTOLIC) 5 MG tablet Take 1 tablet (5 mg total) by mouth daily. 07/01/20   Chilton Si, MD  warfarin (COUMADIN) 5 MG tablet Take 1 tablet (5 mg total) by mouth daily at 4 PM. 07/28/20   Chilton Si, MD    Allergies:  No Known Allergies  Social History:  reports that he quit smoking about a year ago. His smoking use included cigarettes. He has a 35.00 pack-year smoking history. He has never used smokeless tobacco. He reports that he does not drink alcohol and does not use drugs.  Family History: Family History  Problem Relation Age of Onset  . Hypertension Mother   . Hypertension Father   . Stroke Father      Objective   Physical Exam: Blood pressure 132/81, pulse 96, temperature 98.7 F (37.1 C), temperature source Oral, resp. rate (!) 22, height 5\' 9"  (1.753 m), weight 81.6 kg, SpO2 100 %.  Physical Exam Vitals and nursing note reviewed.  Constitutional:      Appearance: Normal appearance.  HENT:     Head: Normocephalic and atraumatic.  Eyes:     Conjunctiva/sclera: Conjunctivae normal.  Neck:     Comments: Negative JVD Cardiovascular:     Rate  and Rhythm: Normal rate.     Pulses: Normal pulses.  Pulmonary:     Effort: Pulmonary effort is normal. No tachypnea, accessory muscle usage or respiratory distress.     Comments: Negative orthopnea SpO2 dropped from 100% to 93% when going from 4 L/min to room air. Placed back on 2 L/min with improved O2 sat. Abdominal:     General: Abdomen is flat.     Palpations: Abdomen is soft.  Musculoskeletal:        General: No swelling or tenderness.     Right lower leg: No edema.     Left lower leg: No edema.  Skin:    Coloration: Skin is not jaundiced or pale.  Neurological:     Mental Status: He is alert. Mental status  is at baseline.  Psychiatric:        Mood and Affect: Mood normal.        Behavior: Behavior normal.     LABS on Admission: I have personally reviewed all the labs and imaging below    Basic Metabolic Panel: Recent Labs  Lab 12/04/20 0247  NA 137  K 4.0  CL 104  CO2 23  GLUCOSE 118*  BUN 9  CREATININE 0.88  CALCIUM 9.1   Liver Function Tests: Recent Labs  Lab 12/04/20 0247  AST 32  ALT 12  ALKPHOS 77  BILITOT 0.9  PROT 8.7*  ALBUMIN 3.9   No results for input(s): LIPASE, AMYLASE in the last 168 hours. No results for input(s): AMMONIA in the last 168 hours. CBC: Recent Labs  Lab 12/04/20 0247  WBC 21.7*  NEUTROABS 17.2*  HGB 10.2*  HCT 33.0*  MCV 88.5  PLT 356   Cardiac Enzymes: No results for input(s): CKTOTAL, CKMB, CKMBINDEX, TROPONINI in the last 168 hours. BNP: Invalid input(s): POCBNP CBG: No results for input(s): GLUCAP in the last 168 hours.  Radiological Exams on Admission:  DG Chest 2 View  Result Date: 12/04/2020 CLINICAL DATA:  Shortness of breath, worsening over the last week. EXAM: CHEST - 2 VIEW COMPARISON:  04/06/2020 FINDINGS: Heart size is normal. Previous mitral valve replacement. Interstitial and alveolar pulmonary edema is present. Fluid in the fissures. Findings consistent with acute congestive heart failure. No  consolidation or collapse. No acute bone finding. IMPRESSION: Congestive heart failure with interstitial and alveolar edema. Electronically Signed   By: Paulina FusiMark  Shogry M.D.   On: 12/04/2020 02:39      EKG: normal sinus rhythm, nonspecific ST and T waves changes, similar to prior but now in sinus.    A & P   Principal Problem:   Acute respiratory failure with hypoxia (HCC) Active Problems:   Essential hypertension   S/P minimally-invasive mitral valve replacement with mechanical valve   PAF (paroxysmal atrial fibrillation) (HCC)   COVID-19 virus infection   1. Acute hypoxic respiratory failure, likely secondary to both COVID 19 and suspected new onset CHF  a. Hypoxic on arrival to 81% on room air, now 93% after Lasix 40 mg IV x 1 b. BNP 180 (minimally elevated) but with orthopnea and CXR consistent with CHF and improvement after lasix c. COVID 19 positive with slightly elevated inflammatory markers in vaccinated/boosted patient d. Check an Echo e. Monitor Daily weights, intake/output f. Continue lasix 20 mg IV BID as he is lasix naive g. Start Remdesivir and steroids h. hold baricitinib as CRP is 6.5  i. PRN inhalers j. Incentive spirometry and flutter valve k. Cardiology consult pending echo findings  2. Hypertension a. Continue home Nebivolol, amlodipine and holding lisinopril  3. Rheumatic heart disease s/p mechanical mitral valve and paroxysmal atrial fibrillation a. Continue telemetry b. Continue aspirin c. Warfarin per pharmacy  4. Chronic leukocytosis a. May need heme/onc referral at discharge    DVT prophylaxis: warfarin   Code Status: Full Code  Diet: heart healthy/carb modified Family Communication: Admission, patients condition and plan of care including tests being ordered have been discussed with the patient who indicates understanding and agrees with the plan and Code Status. Disposition Plan: The appropriate patient status for this patient is INPATIENT.  Inpatient status is judged to be reasonable and necessary in order to provide the required intensity of service to ensure the patient's safety. The patient's presenting symptoms, physical exam findings, and initial  radiographic and laboratory data in the context of their chronic comorbidities is felt to place them at high risk for further clinical deterioration. Furthermore, it is not anticipated that the patient will be medically stable for discharge from the hospital within 2 midnights of admission. The following factors support the patient status of inpatient.   " The patient's presenting symptoms include shortness of breath. " The worrisome physical exam findings include drop in O2 sat. " The initial radiographic and laboratory data are worrisome because of COVID 19+ and CHF on CXR. " The chronic co-morbidities include mechanical mitral valve.   * I certify that at the point of admission it is my clinical judgment that the patient will require inpatient hospital care spanning beyond 2 midnights from the point of admission due to high intensity of service, high risk for further deterioration and high frequency of surveillance required.*   Status is: Inpatient  Remains inpatient appropriate because:IV treatments appropriate due to intensity of illness or inability to take PO and Inpatient level of care appropriate due to severity of illness   Dispo: The patient is from: Home              Anticipated d/c is to: Home              Patient currently is not medically stable to d/c.   Difficult to place patient No   Consultants  . none  Procedures  . none  Time Spent on Admission: 66 minutes    Jae Dire, DO [facility]  12/04/2020, 8:15 AM

## 2020-12-04 NOTE — Progress Notes (Signed)
Echocardiogram 2D Echocardiogram has been performed.  Melvin Burns 12/04/2020, 3:50 PM

## 2020-12-04 NOTE — ED Provider Notes (Addendum)
[facility]-EMERGENCY DEPT Provider Note   CSN: [id number] Arrival date & time: 12/04/20  0214     History Chief Complaint  Patient presents with  . Shortness of Breath    Melvin Burns is a 55 y.o. male.  Patient with history of mechanical mitral valve secondary to rheumatic heart disease presents to the emergency department for evaluation of shortness of breath.  Patient reports that he has been unable to walk around his house because of severe shortness of breath.  He has not been experiencing any chest pain.        Past Medical History:  Diagnosis Date  . Hypertension   . Incidental pulmonary nodule 01/08/2020   Multiple in right lung - need f/u CT 1 year due to h/o smoking  . Mild aortic stenosis   . Mitral valve stenosis, severe   . Moderate mitral regurgitation   . Pneumonia    x 1  . Rheumatic mitral stenosis with regurgitation    Echo 08/08/16:  Severe mitral stenosis (mean gradient 17 mmHg).  Moderate mitral regurgitation.  . 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 Wears glasses   . Wears partial dentures    upper    Patient Active Problem List   Diagnosis Date Noted  . PAF (paroxysmal atrial fibrillation) (HCC) 03/30/2020  . Bacteremia due to Enterobacter species 03/13/2020  . IV site infection (HCC) 03/13/2020  . 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 Incidental pulmonary nodule 01/08/2020  . Pre-operative clearance 06/11/2019  . Mitral valve stenosis, severe 08/15/2017  . Moderate mitral regurgitation 08/15/2017  . Abdominal pain, epigastric 01/02/2017  . Non-intractable vomiting with nausea 01/02/2017  . 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 Family history of stroke 03/11/2013  . Essential hypertension 03/11/2013  .  Smoker 03/11/2013  . Chronic periodontitis, unspecified 03/11/2013    Past Surgical History:  Procedure Laterality Date  . BILATERAL KNEE ARTHROSCOPY    . BUBBLE STUDY  12/16/2019   Procedure: BUBBLE STUDY;  Surgeon: [name], MD;  Location: [facility] ENDOSCOPY;  Service: Cardiology;;  . MITRAL VALVE REPLACEMENT Right 03/06/2020   Procedure: MINIMALLY INVASIVE MITRAL VALVE (MV) REPLACEMENT using Carbomedics Optiform 33 MM Mitral Bioprosthetic Valve.;  Surgeon: Purcell Nails, MD;  Location: [facility] OR;  Service: Open Heart Surgery;  Laterality: Right;  . MULTIPLE EXTRACTIONS WITH ALVEOLOPLASTY N/A 12/31/2019   Procedure: Extraction of tooth #'s 16-17, 20-23, and 28-30 with alveoloplasty, Bilateral mandibular tori reductions, and lateral exostoses reductions.;  Surgeon: Charlynne Pander, DDS;  Location: [facility] OR;  Service: Oral Surgery;  Laterality: N/A;  . RIGHT/LEFT HEART CATH AND CORONARY ANGIOGRAPHY N/A 12/16/2019   Procedure: RIGHT/LEFT HEART CATH AND CORONARY ANGIOGRAPHY;  Surgeon: Yvonne Kendall, MD;  Location: [facility] INVASIVE CV LAB;  Service: Cardiovascular;  Laterality: N/A;  . TEE WITHOUT CARDIOVERSION N/A 12/16/2019   Procedure: TRANSESOPHAGEAL ECHOCARDIOGRAM (TEE);  Surgeon: [name], MD;  Location: [facility] ENDOSCOPY;  Service: Cardiology;  Laterality: N/A;  . TEE WITHOUT CARDIOVERSION N/A 03/06/2020   Procedure: TRANSESOPHAGEAL ECHOCARDIOGRAM (TEE);  Surgeon: Purcell Nails, MD;  Location: [facility] OR;  Service: Open Heart Surgery;  Laterality: N/A;       Family History  Problem Relation Age of Onset  . Hypertension Mother   . Hypertension Father   . Stroke Father     Social History  Tobacco Use  . Smoking status: Former Smoker    Packs/day: 1.00    Years: 35.00    Pack years: 35.00    Types: Cigarettes    Quit date: 12/09/2019    Years since quitting: 0.9  . Smokeless tobacco: Never Used  Vaping Use  . Vaping Use: Never used  Substance Use Topics  . Alcohol use: No  .  Drug use: No    Home Medications Prior to Admission medications   Medication Sig Start Date End Date Taking? Authorizing Provider  amLODipine (NORVASC) 10 MG tablet Take 1 tablet (10 mg total) by mouth daily. 07/01/20 09/29/20  Chilton Si, MD  aspirin EC 81 MG tablet Take 1 tablet (81 mg total) by mouth daily. 12/16/19   End, Cristal Deer, MD  Evolocumab (REPATHA SURECLICK) 140 MG/ML SOAJ Inject 140 mg into the skin every 14 (fourteen) days. 10/08/20   Chilton Si, MD  lisinopril (ZESTRIL) 10 MG tablet Take 1 tablet (10 mg total) by mouth in the morning AND 0.5 tablets (5 mg total) every evening. Patient taking differently: Take 1 tablet (10 mg total) by mouth daily - in the evenings 07/30/20   Chilton Si, MD  nebivolol (BYSTOLIC) 5 MG tablet Take 1 tablet (5 mg total) by mouth daily. 07/01/20   Chilton Si, MD  warfarin (COUMADIN) 5 MG tablet Take 1 tablet (5 mg total) by mouth daily at 4 PM. 07/28/20   Chilton Si, MD    Allergies    Patient has no known allergies.  Review of Systems   Review of Systems  Respiratory: Positive for shortness of breath.   Cardiovascular: Negative for chest pain.  All other systems reviewed and are negative.   Physical Exam Updated Vital Signs BP 129/83   Pulse 98   Temp 98.7 F (37.1 C) (Oral)   Resp (!) 23   Ht 5\' 9"  (1.753 m)   Wt 81.6 kg   SpO2 100%   BMI 26.58 kg/m   Physical Exam Vitals and nursing note reviewed.  Constitutional:      General: He is not in acute distress.    Appearance: Normal appearance. He is well-developed and well-nourished.  HENT:     Head: Normocephalic and atraumatic.     Right Ear: Hearing normal.     Left Ear: Hearing normal.     Nose: Nose normal.     Mouth/Throat:     Mouth: Oropharynx is clear and moist and mucous membranes are normal.  Eyes:     Extraocular Movements: EOM normal.     Conjunctiva/sclera: Conjunctivae normal.     Pupils: Pupils are equal, round, and  reactive to light.  Neck:     Vascular: No JVD.  Cardiovascular:     Rate and Rhythm: Regular rhythm.     Heart sounds: No friction rub. No gallop.      Comments: Mechanical click Pulmonary:     Effort: Pulmonary effort is normal. No respiratory distress.     Breath sounds: Rales present.  Chest:     Chest wall: No tenderness.  Abdominal:     General: Bowel sounds are normal.     Palpations: Abdomen is soft. There is no hepatosplenomegaly.     Tenderness: There is no abdominal tenderness. There is no guarding or rebound. Negative signs include Murphy's sign and McBurney's sign.     Hernia: No hernia is present.  Musculoskeletal:        General: Normal range of motion.  Cervical back: Normal range of motion and neck supple.     Right lower leg: No edema.     Left lower leg: No edema.  Skin:    General: Skin is warm, dry and intact.     Findings: No rash.     Nails: There is no cyanosis.  Neurological:     Mental Status: He is alert and oriented to person, place, and time.     GCS: GCS eye subscore is 4. GCS verbal subscore is 5. GCS motor subscore is 6.     Cranial Nerves: No cranial nerve deficit.     Sensory: No sensory deficit.     Coordination: Coordination normal.     Deep Tendon Reflexes: Strength normal.  Psychiatric:        Mood and Affect: Mood and affect normal.        Speech: Speech normal.        Behavior: Behavior normal.        Thought Content: Thought content normal.     ED Results / Procedures / Treatments   Labs (all labs ordered are listed, but only abnormal results are displayed) Labs Reviewed  RESP PANEL BY RT-PCR (FLU A&B, COVID) ARPGX2 - Abnormal; Notable for the following components:      Result Value   SARS Coronavirus 2 by RT PCR POSITIVE (*)    All other components within normal limits  LACTIC ACID, PLASMA - Abnormal; Notable for the following components:   Lactic Acid, Venous 2.5 (*)    All other components within normal limits   COMPREHENSIVE METABOLIC PANEL - Abnormal; Notable for the following components:   Glucose, Bld 118 (*)    Total Protein 8.7 (*)    All other components within normal limits  CBC WITH DIFFERENTIAL/PLATELET - Abnormal; Notable for the following components:   WBC 21.7 (*)    RBC 3.73 (*)    Hemoglobin 10.2 (*)    HCT 33.0 (*)    Neutro Abs 17.2 (*)    Monocytes Absolute 1.6 (*)    Basophils Absolute 0.2 (*)    Abs Immature Granulocytes 0.15 (*)    All other components within normal limits  PROTIME-INR - Abnormal; Notable for the following components:   Prothrombin Time 24.6 (*)    INR 2.3 (*)    All other components within normal limits  APTT - Abnormal; Notable for the following components:   aPTT 48 (*)    All other components within normal limits  URINALYSIS, ROUTINE W REFLEX MICROSCOPIC - Abnormal; Notable for the following components:   Hgb urine dipstick MODERATE (*)    Protein, ur 30 (*)    All other components within normal limits  BRAIN NATRIURETIC PEPTIDE - Abnormal; Notable for the following components:   B Natriuretic Peptide 180.9 (*)    All other components within normal limits  CULTURE, BLOOD (SINGLE)  URINE CULTURE  D-DIMER, QUANTITATIVE  PROCALCITONIN  LACTATE DEHYDROGENASE  FERRITIN  TRIGLYCERIDES  FIBRINOGEN  C-REACTIVE PROTEIN  TROPONIN I (HIGH SENSITIVITY)    EKG EKG Interpretation  Date/Time:  Friday December 04 2020 02:21:52 EST Ventricular Rate:  96 PR Interval:    QRS Duration: 85 QT Interval:  360 QTC Calculation: 455 R Axis:   53 Text Interpretation: Sinus rhythm Prolonged PR interval Consider left atrial enlargement LVH with secondary repolarization abnormality 12 Lead; Mason-Likar No significant change since last tracing Confirmed by Gilda Crease [id number]) on 12/04/2020 3:16:02 AM   Radiology DG Chest  2 View  Result Date: 12/04/2020 CLINICAL DATA:  Shortness of breath, worsening over the last week. EXAM: CHEST - 2 VIEW  COMPARISON:  04/06/2020 FINDINGS: Heart size is normal. Previous mitral valve replacement. Interstitial and alveolar pulmonary edema is present. Fluid in the fissures. Findings consistent with acute congestive heart failure. No consolidation or collapse. No acute bone finding. IMPRESSION: Congestive heart failure with interstitial and alveolar edema. Electronically Signed   By: Paulina FusiMark  Shogry M.D.   On: 12/04/2020 02:39    Procedures Procedures   Medications Ordered in ED Medications  albuterol (VENTOLIN HFA) 108 (90 Base) MCG/ACT inhaler 4 puff (4 puffs Inhalation Given 12/04/20 0329)  aerochamber Z-Stat Plus/medium (  Given 12/04/20 0329)  furosemide (LASIX) injection 40 mg (40 mg Intravenous Given 12/04/20 0355)    ED Course  I have reviewed the triage vital signs and the nursing notes.  Pertinent labs & imaging results that were available during my care of the patient were reviewed by me and considered in my medical decision making (see chart for details).    MDM Rules/Calculators/A&P                          Patient presents to the emergency department for evaluation of difficulty breathing.  Patient reports severe shortness of breath and significant dyspnea on exertion.  Patient with room air oxygen saturation of 81% at arrival.  He was tachypneic and hypertensive.  Chest x-ray consistent with congestive heart failure.  Patient not currently on diuretics, not treated for congestive heart failure.  Reviewing records reveals echo from July 2021 with normal ejection fraction.  BNP is not significantly elevated.  Perhaps edema secondary to elevated blood pressure noted at arrival.  True heart failure would be concerning in this patient with mechanical mitral valve.  Improved with oxygen, blood pressure improving with diuresis secondary to Lasix administration.  Will need an echo.  COVID test is positive.  Patient has had full vaccine series including booster.  X-ray does not look classic for  COVID findings.  Will add Covid labs.  CRITICAL CARE Performed by: Gilda Creasehristopher J Maxim Bedel   Total critical care time: 30 minutes  Critical care time was exclusive of separately billable procedures and treating other patients.  Critical care was necessary to treat or prevent imminent or life-threatening deterioration.  Critical care was time spent personally by me on the following activities: development of treatment plan with patient and/or surrogate as well as nursing, discussions with consultants, evaluation of patient's response to treatment, examination of patient, obtaining history from patient or surrogate, ordering and performing treatments and interventions, ordering and review of laboratory studies, ordering and review of radiographic studies, pulse oximetry and re-evaluation of patient's condition.   Final Clinical Impression(s) / ED Diagnoses Final diagnoses:  Acute congestive heart failure, unspecified heart failure type [facility](HCC)  COVID-19    Rx / DC Orders ED Discharge Orders    None       Bascom Biel, Canary Brimhristopher J, MD 12/04/20 0533    Gilda CreasePollina, Inaaya Vellucci J, MD 12/04/20 [phone]

## 2020-12-05 LAB — CBC WITH DIFFERENTIAL/PLATELET
Abs Immature Granulocytes: 0.16 10*3/uL — ABNORMAL HIGH (ref 0.00–0.07)
Basophils Absolute: 0 10*3/uL (ref 0.0–0.1)
Basophils Relative: 0 %
Eosinophils Absolute: 0 10*3/uL (ref 0.0–0.5)
Eosinophils Relative: 0 %
HCT: 31.3 % — ABNORMAL LOW (ref 39.0–52.0)
Hemoglobin: 9.6 g/dL — ABNORMAL LOW (ref 13.0–17.0)
Immature Granulocytes: 1 %
Lymphocytes Relative: 4 %
Lymphs Abs: 0.8 10*3/uL (ref 0.7–4.0)
MCH: 26.6 pg (ref 26.0–34.0)
MCHC: 30.7 g/dL (ref 30.0–36.0)
MCV: 86.7 fL (ref 80.0–100.0)
Monocytes Absolute: 0.6 10*3/uL (ref 0.1–1.0)
Monocytes Relative: 3 %
Neutro Abs: 18 10*3/uL — ABNORMAL HIGH (ref 1.7–7.7)
Neutrophils Relative %: 92 %
Platelets: 313 10*3/uL (ref 150–400)
RBC: 3.61 MIL/uL — ABNORMAL LOW (ref 4.22–5.81)
RDW: 14.5 % (ref 11.5–15.5)
WBC: 19.6 10*3/uL — ABNORMAL HIGH (ref 4.0–10.5)
nRBC: 0 % (ref 0.0–0.2)

## 2020-12-05 LAB — COMPREHENSIVE METABOLIC PANEL
ALT: 12 U/L (ref 0–44)
AST: 21 U/L (ref 15–41)
Albumin: 3.5 g/dL (ref 3.5–5.0)
Alkaline Phosphatase: 62 U/L (ref 38–126)
Anion gap: 9 (ref 5–15)
BUN: 17 mg/dL (ref 6–20)
CO2: 25 mmol/L (ref 22–32)
Calcium: 9.8 mg/dL (ref 8.9–10.3)
Chloride: 104 mmol/L (ref 98–111)
Creatinine, Ser: 0.76 mg/dL (ref 0.61–1.24)
GFR, Estimated: >60 mL/min (ref >60)
Glucose, Bld: 149 mg/dL — ABNORMAL HIGH (ref 70–99)
Potassium: 4.2 mmol/L (ref 3.5–5.1)
Sodium: 138 mmol/L (ref 135–145)
Total Bilirubin: 1.2 mg/dL (ref 0.3–1.2)
Total Protein: 8.2 g/dL — ABNORMAL HIGH (ref 6.5–8.1)

## 2020-12-05 LAB — PROTIME-INR
INR: 2.2 — ABNORMAL HIGH (ref 0.8–1.2)
Prothrombin Time: 23.9 seconds — ABNORMAL HIGH (ref 11.4–15.2)

## 2020-12-05 LAB — URINE CULTURE: Culture: NO GROWTH

## 2020-12-05 LAB — C-REACTIVE PROTEIN: CRP: 9 mg/dL — ABNORMAL HIGH (ref <1.0)

## 2020-12-05 LAB — FERRITIN: Ferritin: 112 ng/mL (ref 24–336)

## 2020-12-05 LAB — D-DIMER, QUANTITATIVE: D-Dimer, Quant: 1.08 ug/mL-FEU — ABNORMAL HIGH (ref 0.00–0.50)

## 2020-12-05 LAB — MAGNESIUM: Magnesium: 2.2 mg/dL (ref 1.7–2.4)

## 2020-12-05 MED ORDER — WARFARIN SODIUM 5 MG PO TABS
10.0000 mg | ORAL_TABLET | Freq: Once | ORAL | Status: AC
  Administered 2020-12-05: 10 mg via ORAL
  Filled 2020-12-05: qty 2

## 2020-12-05 NOTE — Progress Notes (Signed)
PROGRESS NOTE  Melvin Burns PYP:[id number] DOB: [birth year] DOA: 12/04/2020 PCP: Chilton Si, MD  HPI/Recap of past 24 hours: HPI from Dr Dairl Ponder  55 year old male who has been vaccinated x 3 against COVID 19 with a history of hypertension, rheumatic heart disease s/p mechanical mitral valve, paroxysmal atrial fibrillation on coumadin and aspirin, tobacco abuse, who presented to the ED with complaints of SOB. Was in his usual state of health until he noticed he had dyspnea on exertion at work. Last night he had sudden onset orthopnea when he went to bed which is unusual for him and prompted him to come to the ED. Admits to change in taste over the past week and decreased appetite. Denies chest pain, palpitations, weight gain, lower extremity edema, myalgias, fever. No known sick contacts. Quit tobacco use 1 year ago. Feeling better since getting lasix in the ED. In the ED, Afebrile, tachycardic, tachypneic,  hypoxic (SpO2 81% on room air) placed on 4 L/min. Notable Labs: BNP 180, Lactic acid 2.5, WBC 21 (chronically elevated), INR 2.3, D dimer 1.67, LDH 535, COVID 19 positive. Notable Imaging: CXR - consistent with CHF with interstitial and alveolar edema. Patient received albuterol, lasix.  Patient admitted for further management     Today, patient denies any new complaints, denies any worsening shortness of breath, chest pain, cough, fever/chills, abdominal pain, nausea/vomiting/diarrhea.    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   PAF (paroxysmal atrial fibrillation) (HCC)   COVID-19 virus infection   Acute hypoxic respiratory failure likely 2/2 ?flash pulmonary edema Vs acute on chronic diastolic HF On presentation, noted to be hypoxic to about 81% on room air, noted uncontrolled BP Currently on 2 L of O2, saturating  well, improvement with diuresis BNP 180 Troponin WNL, EKG NSR Chest x-ray with pulmonary edema Echo showed EF of 60 to 65%, no regional wall motion abnormalities, left ventricular diastolic parameters are indeterminate Continue Lasix Strict I's and O's, daily weights Telemetry, supplemental oxygen as needed Monitor closely  COVID-19 infection Currently afebrile Trend inflammatory markers Continue remdesivir Continue steroids Continue inhalers, I-S, flutter valve Continue supplemental oxygen as needed  Hypertension BP stable Continue amlodipine, nebivolol, hold lisinopril (adjust pending BP readings)  Paroxysmal A. Fib Noted to be in sinus rhythm on admission, rate controlled Continue warfarin, nebivolol Pharmacy to dose warfarin  Rheumatic heart disease s/p mechanical mitral valve Repeat echo showed mechanical valve in position and functioning well Continue warfarin  Normocytic anemia Baseline hemoglobin between 9-10 Anemia panel pending Daily CBC  Chronic leukocytosis Pathologist to review smear Outpatient follow-up with heme-onc      Estimated body mass index is 25.52 kg/m as calculated from the following:   Height as of this encounter: 5\' 9"  (1.753 m).   Weight as of this encounter: 78.4 kg.     Code Status: Full  Family Communication: None at bedside  Disposition Plan: Status is: Inpatient  Remains inpatient appropriate because:Inpatient level of care appropriate due to severity of illness   Dispo: The patient is from: Home              Anticipated d/c is to: Home              Patient currently is not medically stable to d/c.   Difficult to place patient No    Consultants:  None  Procedures: None  Antimicrobials:  None  DVT prophylaxis: Warfarin   Objective: Vitals:   12/04/20 2133 12/05/20 0138 12/05/20 0500 12/05/20 0922  BP: 115/75 128/83  107/78  Pulse: 82 (!) 44  68  Resp: 18 18    Temp: 97.8 F (36.6 C) 97.9 F (36.6 C)     TempSrc: Oral Oral    SpO2: 100% 100%    Weight:   78.4 kg   Height:        Intake/Output Summary (Last 24 hours) at 12/05/2020 0958 Last data filed at 12/04/2020 1800 Gross per 24 hour  Intake 240 ml  Output 700 ml  Net -460 ml   Filed Weights   12/04/20 0221 12/05/20 0500  Weight: 81.6 kg 78.4 kg    Exam:  General: NAD   Cardiovascular: S1, S2 present  Respiratory: CTAB  Abdomen: Soft, nontender, nondistended, bowel sounds present  Musculoskeletal: No bilateral pedal edema noted  Skin: Normal  Psychiatry: Normal mood    Data Reviewed: CBC: Recent Labs  Lab 12/04/20 0247 12/05/20 0313  WBC 21.7* 19.6*  NEUTROABS 17.2* 18.0*  HGB 10.2* 9.6*  HCT 33.0* 31.3*  MCV 88.5 86.7  PLT 356 313   Basic Metabolic Panel: Recent Labs  Lab 12/04/20 0247 12/05/20 0313  NA 137 138  K 4.0 4.2  CL 104 104  CO2 23 25  GLUCOSE 118* 149*  BUN 9 17  CREATININE 0.88 0.76  CALCIUM 9.1 9.8  MG  --  2.2   GFR: Estimated Creatinine Clearance: 103.1 mL/min (by C-G formula based on SCr of 0.76 mg/dL). Liver Function Tests: Recent Labs  Lab 12/04/20 0247 12/05/20 0313  AST 32 21  ALT 12 12  ALKPHOS 77 62  BILITOT 0.9 1.2  PROT 8.7* 8.2*  ALBUMIN 3.9 3.5   No results for input(s): LIPASE, AMYLASE in the last 168 hours. No results for input(s): AMMONIA in the last 168 hours. Coagulation Profile: Recent Labs  Lab 12/02/20 1319 12/04/20 0247 12/05/20 0313  INR 3.9* 2.3* 2.2*   Cardiac Enzymes: No results for input(s): CKTOTAL, CKMB, CKMBINDEX, TROPONINI in the last 168 hours. BNP (last 3 results) No results for input(s): PROBNP in the last 8760 hours. HbA1C: No results for input(s): HGBA1C in the last 72 hours. CBG: No results for input(s): GLUCAP in the last 168 hours. Lipid Profile: Recent Labs    12/04/20 0617  TRIG 130   Thyroid Function Tests: No results for input(s): TSH, T4TOTAL, FREET4, T3FREE, THYROIDAB in the last 72 hours. Anemia  Panel: Recent Labs    12/04/20 0617 12/05/20 0313  FERRITIN 110 112   Urine analysis:    Component Value Date/Time   COLORURINE YELLOW 12/04/2020 0247   APPEARANCEUR CLEAR 12/04/2020 0247   LABSPEC 1.012 12/04/2020 0247   PHURINE 5.0 12/04/2020 0247   GLUCOSEU NEGATIVE 12/04/2020 0247   HGBUR MODERATE (A) 12/04/2020 0247   BILIRUBINUR NEGATIVE 12/04/2020 0247   KETONESUR NEGATIVE 12/04/2020 0247   PROTEINUR 30 (A) 12/04/2020 0247   UROBILINOGEN 0.2 03/11/2013 1135   NITRITE NEGATIVE 12/04/2020 0247   LEUKOCYTESUR NEGATIVE 12/04/2020 0247   Sepsis Labs: @LABRCNTIP (procalcitonin:4,lacticidven:4)  ) Recent Results (from the past 240 hour(s))  Resp Panel by RT-PCR (Flu A&B, Covid) Nasopharyngeal Swab     Status: Abnormal   Collection Time: 12/04/20  2:47 AM   Specimen: Nasopharyngeal Swab; Nasopharyngeal(NP) swabs in vial transport medium  Result Value Ref Range Status   SARS Coronavirus 2 by RT PCR POSITIVE (A) NEGATIVE Final    Comment: RESULT  CALLED TO, READ BACK BY AND VERIFIED WITH: HAILEY, RN @ 0522 ON 12/04/20 C VARNER (NOTE) SARS-CoV-2 target nucleic acids are DETECTED.  The SARS-CoV-2 RNA is generally detectable in upper respiratory specimens during the acute phase of infection. Positive results are indicative of the presence of the identified virus, but do not rule out bacterial infection or co-infection with other pathogens not detected by the test. Clinical correlation with patient history and other diagnostic information is necessary to determine patient infection status. The expected result is Negative.  Fact Sheet for Patients: BloggerCourse.com  Fact Sheet for Healthcare Providers: SeriousBroker.it  This test is not yet approved or cleared by the [place] FDA and  has been authorized for detection and/or diagnosis of SARS-CoV-2 by FDA under an Emergency Use Authorization (EUA).  This EUA  will remain in effect (meaning this test ca n be used) for the duration of  the COVID-19 declaration under Section 564(b)(1) of the Act, 21 U.S.C. section 360bbb-3(b)(1), unless the authorization is terminated or revoked sooner.     Influenza A by PCR NEGATIVE NEGATIVE Final   Influenza B by PCR NEGATIVE NEGATIVE Final    Comment: (NOTE) The Xpert Xpress SARS-CoV-2/FLU/RSV plus assay is intended as an aid in the diagnosis of influenza from Nasopharyngeal swab specimens and should not be used as a sole basis for treatment. Nasal washings and aspirates are unacceptable for Xpert Xpress SARS-CoV-2/FLU/RSV testing.  Fact Sheet for Patients: BloggerCourse.com  Fact Sheet for Healthcare Providers: SeriousBroker.it  This test is not yet approved or cleared by the [place] FDA and has been authorized for detection and/or diagnosis of SARS-CoV-2 by FDA under an Emergency Use Authorization (EUA). This EUA will remain in effect (meaning this test can be used) for the duration of the COVID-19 declaration under Section 564(b)(1) of the Act, 21 U.S.C. section 360bbb-3(b)(1), unless the authorization is terminated or revoked.  Performed at [facility], [address], [place], [place] [address]   Urine culture     Status: None   Collection Time: 12/04/20  2:47 AM   Specimen: In/Out Cath Urine  Result Value Ref Range Status   Specimen Description   Final    IN/OUT CATH URINE Performed at [facility], [address], [place], [place] [address]    Special Requests   Final    NONE Performed at [facility], [address] [place], [place] [address]    Culture   Final    NO GROWTH Performed at [facility], [address], [place], [place] [address]    Report Status 12/05/2020 FINAL  Final      Studies: ECHOCARDIOGRAM COMPLETE  Result Date: 12/04/2020     ECHOCARDIOGRAM REPORT   Patient Name:   Melvin Burns Date of Exam: 12/04/2020 Medical Rec #:  [id number]           Height:       69.0 in Accession #:    [id number]          Weight:       180.0 lb Date of Birth:  06-Dec-1965          BSA:          1.976 m Patient Age:    54 years            BP:           105/66 mmHg Patient Gender: M  HR:           77 bpm. Exam Location:  Inpatient Procedure: 2D Echo, 3D Echo, Cardiac Doppler and Color Doppler Indications:    I50.23 Acute on chronic systolic (congestive) heart failure  History:        Patient has prior history of Echocardiogram examinations, most                 recent 05/08/2020. Mitral Valve Disease and Aortic Valve Disease;                 Risk Factors:Hypertension.                  Mitral Valve: 33 mm [employer]in [employer] mechanical valve valve is                 present in the mitral position. Procedure Date: 03/06/2020.  Sonographer:    Elmarie Shiley Dance Referring Phys: [id number] JARED E SEGAL IMPRESSIONS  1. Left ventricular ejection fraction, by estimation, is 60 to 65%. The left ventricle has normal function. The left ventricle has no regional wall motion abnormalities. There is mild left ventricular hypertrophy. Left ventricular diastolic parameters are indeterminate.  2. Right ventricular systolic function is normal. The right ventricular size is normal.  3. Left atrial size was mildly dilated.  4. The mitral valve has been repaired/replaced. No evidence of mitral valve regurgitation. No evidence of mitral stenosis. There is a 33 mm [employer] mechanical valve present in the mitral position. Procedure Date: 03/06/2020. Echo findings are consistent with normal structure and function of the mitral valve prosthesis.  5. The aortic valve is normal in structure. Aortic valve regurgitation is moderate. No aortic stenosis is present.  6. The inferior vena cava is normal in size with greater than 50% respiratory variability, suggesting right  atrial pressure of 3 mmHg. FINDINGS  Left Ventricle: Left ventricular ejection fraction, by estimation, is 60 to 65%. The left ventricle has normal function. The left ventricle has no regional wall motion abnormalities. The left ventricular internal cavity size was normal in size. There is  mild left ventricular hypertrophy. Left ventricular diastolic parameters are indeterminate. Right Ventricle: The right ventricular size is normal. No increase in right ventricular wall thickness. Right ventricular systolic function is normal. Left Atrium: Left atrial size was mildly dilated. Right Atrium: Right atrial size was normal in size. Pericardium: There is no evidence of pericardial effusion. Mitral Valve: The mitral valve has been repaired/replaced. No evidence of mitral valve regurgitation. There is a 33 mm Sorin [employer] mechanical valve present in the mitral position. Procedure Date: 03/06/2020. Echo findings are consistent with normal  structure and function of the mitral valve prosthesis. No evidence of mitral valve stenosis. MV peak gradient, 18.2 mmHg. The mean mitral valve gradient is 6.5 mmHg. Tricuspid Valve: The tricuspid valve is normal in structure. Tricuspid valve regurgitation is not demonstrated. No evidence of tricuspid stenosis. Aortic Valve: The aortic valve is normal in structure. Aortic valve regurgitation is moderate. Aortic regurgitation PHT measures 359 msec. No aortic stenosis is present. Aortic valve mean gradient measures 11.0 mmHg. Aortic valve peak gradient measures 20.5 mmHg. Aortic valve area, by VTI measures 1.27 cm. Pulmonic Valve: The pulmonic valve was normal in structure. Pulmonic valve regurgitation is not visualized. No evidence of pulmonic stenosis. Aorta: The aortic root is normal in size and structure. Venous: The inferior vena cava is normal in size with greater than 50% respiratory variability, suggesting right atrial pressure of 3 mmHg.  IAS/Shunts: No atrial level shunt  detected by color flow Doppler.  LEFT VENTRICLE PLAX 2D LVIDd:         5.30 cm LVIDs:         3.80 cm LV PW:         1.20 cm LV IVS:        1.00 cm LVOT diam:     1.90 cm LV SV:         54 LV SV Index:   27 LVOT Area:     2.84 cm  RIGHT VENTRICLE             IVC RV Mid diam:    1.70 cm     IVC diam: 1.10 cm RV S prime:     11.70 cm/s TAPSE (M-mode): 2.0 cm LEFT ATRIUM             Index       RIGHT ATRIUM           Index LA diam:        5.30 cm 2.68 cm/m  RA Area:     13.60 cm LA Vol (A2C):   61.0 ml 30.88 ml/m RA Volume:   26.50 ml  13.41 ml/m LA Vol (A4C):   79.2 ml 40.09 ml/m LA Biplane Vol: 70.9 ml 35.89 ml/m  AORTIC VALVE AV Area (Vmax):    1.20 cm AV Area (Vmean):   1.26 cm AV Area (VTI):     1.27 cm AV Vmax:           226.50 cm/s AV Vmean:          154.000 cm/s AV VTI:            0.420 m AV Peak Grad:      20.5 mmHg AV Mean Grad:      11.0 mmHg LVOT Vmax:         95.60 cm/s LVOT Vmean:        68.200 cm/s LVOT VTI:          0.189 m LVOT/AV VTI ratio: 0.45 AI PHT:            359 msec  AORTA Ao Root diam: 2.90 cm Ao Asc diam:  3.30 cm MITRAL VALVE MV Area (PHT): 3.17 cm     SHUNTS MV Area VTI:   1.12 cm     Systemic VTI:  0.19 m MV Peak grad:  18.2 mmHg    Systemic Diam: 1.90 cm MV Mean grad:  6.5 mmHg MV Vmax:       2.13 m/s MV Vmean:      118.0 cm/s MV Decel Time: 239 msec MV E velocity: 203.00 cm/s MV A velocity: 93.80 cm/s MV E/A ratio:  2.16 Donato Schultz MD Electronically signed by Donato Schultz MD Signature Date/Time: 12/04/2020/3:55:20 PM    Final     Scheduled Meds: . amLODipine  10 mg Oral Daily  . aspirin EC  81 mg Oral Daily  . furosemide  20 mg Intravenous BID  . methylPREDNISolone (SOLU-MEDROL) injection  0.5 mg/kg Intravenous Q12H   Followed by  . [START ON 12/07/2020] predniSONE  50 mg Oral Daily  . nebivolol  5 mg Oral Daily  . sodium chloride flush  3 mL Intravenous Q12H  . Warfarin - Pharmacist Dosing Inpatient   Does not apply q1600    Continuous Infusions: . remdesivir  100 mg in NS 100 mL 100 mg (12/05/20 0930)     LOS: 1 day  Briant CedarNkeiruka J Clare Casto, MD [facility]  If 7PM-7AM, please contact night-coverage [email or web address] 12/05/2020, 9:58 AM

## 2020-12-05 NOTE — Progress Notes (Signed)
ANTICOAGULATION CONSULT NOTE Pharmacy Consult for warfarin Indication: MVR INR goal 2.5-3.5  No Known Allergies  Patient Measurements: Height: 5\' 9"  (175.3 cm) Weight: 78.4 kg (172 lb 13.5 oz) IBW/kg (Calculated) : 70.7  Vital Signs: Temp: 97.9 F (36.6 C) (02/26 0138) Temp Source: Oral (02/26 0138) BP: 107/78 (02/26 0922) Pulse Rate: 68 (02/26 0922)  Labs: Recent Labs    12/02/20 1319 12/04/20 0247 12/04/20 0617 12/05/20 0313  HGB  --  10.2*  --  9.6*  HCT  --  33.0*  --  31.3*  PLT  --  356  --  313  APTT  --  48*  --   --   LABPROT  --  24.6*  --  23.9*  INR 3.9* 2.3*  --  2.2*  CREATININE  --  0.88  --  0.76  TROPONINIHS  --  13 14  --     Estimated Creatinine Clearance: 103.1 mL/min (by C-G formula based on SCr of 0.76 mg/dL).  Assessment: 55 yo male admitted with COVID to continue warfarin inpatient for hx MVR followed by anticoag clinic as outpatient. Dosing per clinic note on 2/23 should be 7.5mg  daily except 5mg  on TuThSat.   INR remains slightly below goal at 2.2  Hgb 9.6, Plts 313, no bleeding reported  Goal of Therapy:  INR 2.5-3.5   Plan:   Coumadin 10 mg po x 1 dose today  Daily INR  3/23, Pharm.D 12/05/2020 11:14 AM

## 2020-12-06 LAB — CBC WITH DIFFERENTIAL/PLATELET
Abs Immature Granulocytes: 0.55 10*3/uL — ABNORMAL HIGH (ref 0.00–0.07)
Basophils Absolute: 0.1 10*3/uL (ref 0.0–0.1)
Basophils Relative: 0 %
Eosinophils Absolute: 0 10*3/uL (ref 0.0–0.5)
Eosinophils Relative: 0 %
HCT: 32.8 % — ABNORMAL LOW (ref 39.0–52.0)
Hemoglobin: 10.1 g/dL — ABNORMAL LOW (ref 13.0–17.0)
Immature Granulocytes: 2 %
Lymphocytes Relative: 4 %
Lymphs Abs: 1.4 10*3/uL (ref 0.7–4.0)
MCH: 27.2 pg (ref 26.0–34.0)
MCHC: 30.8 g/dL (ref 30.0–36.0)
MCV: 88.2 fL (ref 80.0–100.0)
Monocytes Absolute: 1.3 10*3/uL — ABNORMAL HIGH (ref 0.1–1.0)
Monocytes Relative: 4 %
Neutro Abs: 31.5 10*3/uL — ABNORMAL HIGH (ref 1.7–7.7)
Neutrophils Relative %: 90 %
Platelets: 342 10*3/uL (ref 150–400)
RBC: 3.72 MIL/uL — ABNORMAL LOW (ref 4.22–5.81)
RDW: 14.8 % (ref 11.5–15.5)
WBC: 34.7 10*3/uL — ABNORMAL HIGH (ref 4.0–10.5)
nRBC: 0 % (ref 0.0–0.2)

## 2020-12-06 LAB — COMPREHENSIVE METABOLIC PANEL
ALT: 13 U/L (ref 0–44)
AST: 24 U/L (ref 15–41)
Albumin: 3.5 g/dL (ref 3.5–5.0)
Alkaline Phosphatase: 62 U/L (ref 38–126)
Anion gap: 10 (ref 5–15)
BUN: 20 mg/dL (ref 6–20)
CO2: 26 mmol/L (ref 22–32)
Calcium: 9.8 mg/dL (ref 8.9–10.3)
Chloride: 101 mmol/L (ref 98–111)
Creatinine, Ser: 0.89 mg/dL (ref 0.61–1.24)
GFR, Estimated: >60 mL/min (ref >60)
Glucose, Bld: 139 mg/dL — ABNORMAL HIGH (ref 70–99)
Potassium: 4.1 mmol/L (ref 3.5–5.1)
Sodium: 137 mmol/L (ref 135–145)
Total Bilirubin: 1 mg/dL (ref 0.3–1.2)
Total Protein: 7.9 g/dL (ref 6.5–8.1)

## 2020-12-06 LAB — PROTIME-INR
INR: 3.1 — ABNORMAL HIGH (ref 0.8–1.2)
Prothrombin Time: 31.3 seconds — ABNORMAL HIGH (ref 11.4–15.2)

## 2020-12-06 LAB — IRON AND TIBC
Iron: 73 ug/dL (ref 45–182)
Saturation Ratios: 24 % (ref 17.9–39.5)
TIBC: 298 ug/dL (ref 250–450)
UIBC: 225 ug/dL

## 2020-12-06 LAB — D-DIMER, QUANTITATIVE: D-Dimer, Quant: 0.96 ug/mL-FEU — ABNORMAL HIGH (ref 0.00–0.50)

## 2020-12-06 LAB — VITAMIN B12: Vitamin B-12: 440 pg/mL (ref 180–914)

## 2020-12-06 LAB — FERRITIN: Ferritin: 115 ng/mL (ref 24–336)

## 2020-12-06 LAB — MAGNESIUM: Magnesium: 2.3 mg/dL (ref 1.7–2.4)

## 2020-12-06 LAB — LACTIC ACID, PLASMA: Lactic Acid, Venous: 1.8 mmol/L (ref 0.5–1.9)

## 2020-12-06 LAB — FOLATE: Folate: 6.2 ng/mL (ref >5.9)

## 2020-12-06 LAB — C-REACTIVE PROTEIN: CRP: 4.7 mg/dL — ABNORMAL HIGH (ref <1.0)

## 2020-12-06 MED ORDER — WARFARIN SODIUM 5 MG PO TABS: 5.0000 mg | ORAL_TABLET | ORAL | Status: DC

## 2020-12-06 MED ORDER — WARFARIN SODIUM 7.5 MG PO TABS
7.5000 mg | ORAL_TABLET | ORAL | Status: DC
  Administered 2020-12-06: 7.5 mg via ORAL
  Filled 2020-12-06 (×2): qty 1

## 2020-12-06 NOTE — Progress Notes (Signed)
ANTICOAGULATION CONSULT NOTE Pharmacy Consult for warfarin Indication: MVR INR goal 2.5-3.5  No Known Allergies  Patient Measurements: Height: 5\' 9"  (175.3 cm) Weight: 78.1 kg (172 lb 2.9 oz) IBW/kg (Calculated) : 70.7  Vital Signs: Temp: 98.2 F (36.8 C) (02/27 0527) Temp Source: Oral (02/27 0527) BP: 134/79 (02/27 1014) Pulse Rate: 95 (02/27 0527)  Labs: Recent Labs    12/04/20 0247 12/04/20 0617 12/05/20 0313 12/06/20 0348  HGB 10.2*  --  9.6* 10.1*  HCT 33.0*  --  31.3* 32.8*  PLT 356  --  313 342  APTT 48*  --   --   --   LABPROT 24.6*  --  23.9* 31.3*  INR 2.3*  --  2.2* 3.1*  CREATININE 0.88  --  0.76 0.89  TROPONINIHS 13 14  --   --     Estimated Creatinine Clearance: 92.7 mL/min (by C-G formula based on SCr of 0.89 mg/dL).  Assessment: 55 yo male admitted with COVID to continue warfarin inpatient for hx MVR followed by anticoag clinic as outpatient. Dosing per clinic note on 2/23 should be 7.5mg  daily except 5mg  on TuThSat.    INR therapeutic at 3.1  Hgb 10.1, Plts 342, no bleeding reported  Goal of Therapy:  INR 2.5-3.5   Plan:   Resume home dose 7.5 daily x 5 mg T/Th/Sat  Daily INR  , Pharm.D 12/06/2020 1:10 PM

## 2020-12-06 NOTE — Progress Notes (Signed)
PROGRESS NOTE  Melvin Burns [name]:[id number] DOB: 1966/07/16 DOA: 12/04/2020 PCP: Chilton Siandolph, Tiffany, MD  HPI/Recap of past 24 hours: HPI from Dr Dairl PonderSegal  55 year old male who has been vaccinated x 3 against COVID 19 with a history of hypertension, rheumatic heart disease s/p mechanical mitral valve, paroxysmal atrial fibrillation on coumadin and aspirin, tobacco abuse, who presented to the ED with complaints of SOB. Was in his usual state of health until he noticed he had dyspnea on exertion at work. Last night he had sudden onset orthopnea when he went to bed which is unusual for him and prompted him to come to the ED. Admits to change in taste over the past week and decreased appetite. Denies chest pain, palpitations, weight gain, lower extremity edema, myalgias, fever. No known sick contacts. Quit tobacco use 1 year ago. Feeling better since getting lasix in the ED. In the ED, Afebrile, tachycardic, tachypneic,  hypoxic (SpO2 81% on room air) placed on 4 L/min. Notable Labs: BNP 180, Lactic acid 2.5, WBC 21 (chronically elevated), INR 2.3, D dimer 1.67, LDH 535, COVID 19 positive. Notable Imaging: CXR - consistent with CHF with interstitial and alveolar edema. Patient received albuterol, lasix.  Patient admitted for further management     Today, patient denies any new complaints.    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   PAF (paroxysmal atrial fibrillation) (HCC)   COVID-19 virus infection   Acute hypoxic respiratory failure likely 2/2 ?flash pulmonary edema Vs acute on chronic diastolic HF On presentation, noted to be hypoxic to about 81% on room air, noted uncontrolled BP Currently saturating well on room air BNP 180 Troponin WNL, EKG NSR Chest x-ray with pulmonary edema, repeat in am Echo showed EF of 60 to 65%, no  regional wall motion abnormalities, left ventricular diastolic parameters are indeterminate Continue Lasix Strict I's and O's, daily weights Telemetry, supplemental oxygen as needed Monitor closely  COVID-19 infection Currently afebrile Trend inflammatory markers Continue remdesivir X 3 days Continue steroids Continue inhalers, I-S, flutter valve Continue supplemental oxygen as needed  Hypertension BP stable Continue amlodipine, nebivolol, hold lisinopril (adjust pending BP readings)  Paroxysmal A. Fib Noted to be in sinus rhythm on admission, rate controlled Continue warfarin, nebivolol Pharmacy to dose warfarin  Rheumatic heart disease s/p mechanical mitral valve Repeat echo showed mechanical valve in position and functioning well Continue warfarin  Normocytic anemia Baseline hemoglobin between 9-10 Anemia panel WNL Daily CBC  Chronic leukocytosis Pathologist to review smear pending Outpatient follow-up with heme-onc      Estimated body mass index is 25.43 kg/m as calculated from the following:   Height as of this encounter: 5\' 9"  (1.753 m).   Weight as of this encounter: 78.1 kg.     Code Status: Full  Family Communication: None at bedside  Disposition Plan: Status is: Inpatient  Remains inpatient appropriate because:Inpatient level of care appropriate due to severity of illness   Dispo: The patient is from: Home              Anticipated d/c is to: Home              Patient currently is not medically stable to d/c.   Difficult to place patient No    Consultants:  None  Procedures: None  Antimicrobials:  None  DVT prophylaxis: Warfarin   Objective: Vitals:  12/05/20 2003 12/06/20 0527 12/06/20 1014 12/06/20 1354  BP: 136/71 121/87 134/79 (!) 116/91  Pulse: 90 95  64  Resp: 20   16  Temp: 98.3 F (36.8 C) 98.2 F (36.8 C)  98.7 F (37.1 C)  TempSrc: Oral Oral    SpO2: 98% 99%  100%  Weight:  78.1 kg    Height:         Intake/Output Summary (Last 24 hours) at 12/06/2020 1435 Last data filed at 12/06/2020 1300 Gross per 24 hour  Intake 580 ml  Output 1250 ml  Net -670 ml   Filed Weights   12/04/20 0221 12/05/20 0500 12/06/20 0527  Weight: 81.6 kg 78.4 kg 78.1 kg    Exam:  General: NAD   Cardiovascular: S1, S2 present  Respiratory: CTAB  Abdomen: Soft, nontender, nondistended, bowel sounds present  Musculoskeletal: No bilateral pedal edema noted  Skin: Normal  Psychiatry: Normal mood    Data Reviewed: CBC: Recent Labs  Lab 12/04/20 0247 12/05/20 0313 12/06/20 0348  WBC 21.7* 19.6* 34.7*  NEUTROABS 17.2* 18.0* 31.5*  HGB 10.2* 9.6* 10.1*  HCT 33.0* 31.3* 32.8*  MCV 88.5 86.7 88.2  PLT 356 313 342   Basic Metabolic Panel: Recent Labs  Lab 12/04/20 0247 12/05/20 0313 12/06/20 0348  NA 137 138 137  K 4.0 4.2 4.1  CL 104 104 101  CO2 23 25 26   GLUCOSE 118* 149* 139*  BUN 9 17 20   CREATININE 0.88 0.76 0.89  CALCIUM 9.1 9.8 9.8  MG  --  2.2 2.3   GFR: Estimated Creatinine Clearance: 92.7 mL/min (by C-G formula based on SCr of 0.89 mg/dL). Liver Function Tests: Recent Labs  Lab 12/04/20 0247 12/05/20 0313 12/06/20 0348  AST 32 21 24  ALT 12 12 13   ALKPHOS 77 62 62  BILITOT 0.9 1.2 1.0  PROT 8.7* 8.2* 7.9  ALBUMIN 3.9 3.5 3.5   No results for input(s): LIPASE, AMYLASE in the last 168 hours. No results for input(s): AMMONIA in the last 168 hours. Coagulation Profile: Recent Labs  Lab 12/02/20 1319 12/04/20 0247 12/05/20 0313 12/06/20 0348  INR 3.9* 2.3* 2.2* 3.1*   Cardiac Enzymes: No results for input(s): CKTOTAL, CKMB, CKMBINDEX, TROPONINI in the last 168 hours. BNP (last 3 results) No results for input(s): PROBNP in the last 8760 hours. HbA1C: No results for input(s): HGBA1C in the last 72 hours. CBG: No results for input(s): GLUCAP in the last 168 hours. Lipid Profile: Recent Labs    12/04/20 0617  TRIG 130   Thyroid Function Tests: No  results for input(s): TSH, T4TOTAL, FREET4, T3FREE, THYROIDAB in the last 72 hours. Anemia Panel: Recent Labs    12/05/20 0313 12/06/20 0348  VITAMINB12  --  440  FOLATE  --  6.2  FERRITIN 112 115  TIBC  --  298  IRON  --  73   Urine analysis:    Component Value Date/Time   COLORURINE YELLOW 12/04/2020 0247   APPEARANCEUR CLEAR 12/04/2020 0247   LABSPEC 1.012 12/04/2020 0247   PHURINE 5.0 12/04/2020 0247   GLUCOSEU NEGATIVE 12/04/2020 0247   HGBUR MODERATE (A) 12/04/2020 0247   BILIRUBINUR NEGATIVE 12/04/2020 0247   KETONESUR NEGATIVE 12/04/2020 0247   PROTEINUR 30 (A) 12/04/2020 0247   UROBILINOGEN 0.2 03/11/2013 1135   NITRITE NEGATIVE 12/04/2020 0247   LEUKOCYTESUR NEGATIVE 12/04/2020 0247   Sepsis Labs: @LABRCNTIP (procalcitonin:4,lacticidven:4)  ) Recent Results (from the past 240 hour(s))  Resp Panel by RT-PCR (Flu A&B, Covid)  Nasopharyngeal Swab     Status: Abnormal   Collection Time: 12/04/20  2:47 AM   Specimen: Nasopharyngeal Swab; Nasopharyngeal(NP) swabs in vial transport medium  Result Value Ref Range Status   SARS Coronavirus 2 by RT PCR POSITIVE (A) NEGATIVE Final    Comment: RESULT CALLED TO, READ BACK BY AND VERIFIED WITH: HAILEY, Melvin Burns @ 0522 ON 12/04/20 C VARNER (NOTE) SARS-CoV-2 target nucleic acids are DETECTED.  The SARS-CoV-2 RNA is generally detectable in upper respiratory specimens during the acute phase of infection. Positive results are indicative of the presence of the identified virus, but do not rule out bacterial infection or co-infection with other pathogens not detected by the test. Clinical correlation with patient history and other diagnostic information is necessary to determine patient infection status. The expected result is Negative.  Fact Sheet for Patients: BloggerCourse.com  Fact Sheet for Healthcare Providers: SeriousBroker.it  This test is not yet approved or cleared by the  [place] FDA and  has been authorized for detection and/or diagnosis of SARS-CoV-2 by FDA under an Emergency Use Authorization (EUA).  This EUA will remain in effect (meaning this test ca n be used) for the duration of  the COVID-19 declaration under Section 564(b)(1) of the Act, 21 U.S.C. section 360bbb-3(b)(1), unless the authorization is terminated or revoked sooner.     Influenza A by PCR NEGATIVE NEGATIVE Final   Influenza B by PCR NEGATIVE NEGATIVE Final    Comment: (NOTE) The Xpert Xpress SARS-CoV-2/FLU/RSV plus assay is intended as an aid in the diagnosis of influenza from Nasopharyngeal swab specimens and should not be used as a sole basis for treatment. Nasal washings and aspirates are unacceptable for Xpert Xpress SARS-CoV-2/FLU/RSV testing.  Fact Sheet for Patients: BloggerCourse.com  Fact Sheet for Healthcare Providers: SeriousBroker.it  This test is not yet approved or cleared by the [place] FDA and has been authorized for detection and/or diagnosis of SARS-CoV-2 by FDA under an Emergency Use Authorization (EUA). This EUA will remain in effect (meaning this test can be used) for the duration of the COVID-19 declaration under Section 564(b)(1) of the Act, 21 U.S.C. section 360bbb-3(b)(1), unless the authorization is terminated or revoked.  Performed at [facility], [address], [place], [place] [address]   Blood culture (routine single)     Status: None (Preliminary result)   Collection Time: 12/04/20  2:47 AM   Specimen: BLOOD  Result Value Ref Range Status   Specimen Description   Final    BLOOD LEFT ANTECUBITAL Performed at [facility], [address], [place], [place] [address]    Special Requests   Final    BOTTLES DRAWN AEROBIC AND ANAEROBIC Blood Culture results may not be optimal due to an inadequate volume of blood received in culture  bottles Performed at [facility], [address] [place], [place] [address]    Culture   Final    NO GROWTH 2 DAYS Performed at [facility], [address], [place], [place] [address]    Report Status PENDING  Incomplete  Urine culture     Status: None   Collection Time: 12/04/20  2:47 AM   Specimen: In/Out Cath Urine  Result Value Ref Range Status   Specimen Description   Final    IN/OUT CATH URINE Performed at [facility], [address] [place], [place] [address]    Special Requests   Final    NONE Performed at [facility], [address], [place], [place] [address]    Culture   Final    NO GROWTH Performed at [facility], [address] [place], [place] [address]    Report Status 12/05/2020 FINAL  Final      Studies: No results found.  Scheduled Meds: . amLODipine  10 mg Oral Daily  . aspirin EC  81 mg Oral Daily  . furosemide  20 mg Intravenous BID  . methylPREDNISolone (SOLU-MEDROL) injection  0.5 mg/kg Intravenous Q12H   Followed by  . [START ON 12/07/2020] predniSONE  50 mg Oral Daily  . nebivolol  5 mg Oral Daily  . sodium chloride flush  3 mL Intravenous Q12H  . warfarin  7.5 mg Oral Once per day on Sun Mon Wed Fri   And  . [START ON 12/10/2020] warfarin  5 mg Oral Once per day on Thu Sat  . Warfarin - Pharmacist Dosing Inpatient   Does not apply q1600    Continuous Infusions: . remdesivir 100 mg in NS 100 mL 100 mg (12/06/20 1030)     LOS: 2 days     Briant Cedar, MD [facility]  If 7PM-7AM, please contact night-coverage [email or web address] 12/06/2020, 2:35 PM

## 2020-12-07 ENCOUNTER — Telehealth: Payer: Self-pay | Admitting: Cardiovascular Disease

## 2020-12-07 ENCOUNTER — Inpatient Hospital Stay (HOSPITAL_COMMUNITY): Payer: 59

## 2020-12-07 LAB — CBC WITH DIFFERENTIAL/PLATELET
Abs Immature Granulocytes: 0.59 10*3/uL — ABNORMAL HIGH (ref 0.00–0.07)
Basophils Absolute: 0.1 10*3/uL (ref 0.0–0.1)
Basophils Relative: 0 %
Eosinophils Absolute: 0 10*3/uL (ref 0.0–0.5)
Eosinophils Relative: 0 %
HCT: 33.5 % — ABNORMAL LOW (ref 39.0–52.0)
Hemoglobin: 10.4 g/dL — ABNORMAL LOW (ref 13.0–17.0)
Immature Granulocytes: 2 %
Lymphocytes Relative: 9 %
Lymphs Abs: 2.8 10*3/uL (ref 0.7–4.0)
MCH: 27 pg (ref 26.0–34.0)
MCHC: 31 g/dL (ref 30.0–36.0)
MCV: 87 fL (ref 80.0–100.0)
Monocytes Absolute: 2.5 10*3/uL — ABNORMAL HIGH (ref 0.1–1.0)
Monocytes Relative: 8 %
Neutro Abs: 26.2 10*3/uL — ABNORMAL HIGH (ref 1.7–7.7)
Neutrophils Relative %: 81 %
Platelets: 376 10*3/uL (ref 150–400)
RBC: 3.85 MIL/uL — ABNORMAL LOW (ref 4.22–5.81)
RDW: 15.1 % (ref 11.5–15.5)
WBC: 32.2 10*3/uL — ABNORMAL HIGH (ref 4.0–10.5)
nRBC: 0.1 % (ref 0.0–0.2)

## 2020-12-07 LAB — COMPREHENSIVE METABOLIC PANEL
ALT: 12 U/L (ref 0–44)
AST: 23 U/L (ref 15–41)
Albumin: 3.4 g/dL — ABNORMAL LOW (ref 3.5–5.0)
Alkaline Phosphatase: 67 U/L (ref 38–126)
Anion gap: 9 (ref 5–15)
BUN: 26 mg/dL — ABNORMAL HIGH (ref 6–20)
CO2: 27 mmol/L (ref 22–32)
Calcium: 9.3 mg/dL (ref 8.9–10.3)
Chloride: 103 mmol/L (ref 98–111)
Creatinine, Ser: 0.86 mg/dL (ref 0.61–1.24)
GFR, Estimated: >60 mL/min (ref >60)
Glucose, Bld: 120 mg/dL — ABNORMAL HIGH (ref 70–99)
Potassium: 4.2 mmol/L (ref 3.5–5.1)
Sodium: 139 mmol/L (ref 135–145)
Total Bilirubin: 0.7 mg/dL (ref 0.3–1.2)
Total Protein: 7.6 g/dL (ref 6.5–8.1)

## 2020-12-07 LAB — PROTIME-INR
INR: 4 — ABNORMAL HIGH (ref 0.8–1.2)
Prothrombin Time: 37.5 seconds — ABNORMAL HIGH (ref 11.4–15.2)

## 2020-12-07 LAB — PATHOLOGIST SMEAR REVIEW

## 2020-12-07 LAB — MAGNESIUM: Magnesium: 2.3 mg/dL (ref 1.7–2.4)

## 2020-12-07 LAB — C-REACTIVE PROTEIN: CRP: 2.4 mg/dL — ABNORMAL HIGH (ref <1.0)

## 2020-12-07 LAB — D-DIMER, QUANTITATIVE: D-Dimer, Quant: 0.61 ug/mL-FEU — ABNORMAL HIGH (ref 0.00–0.50)

## 2020-12-07 MED ORDER — PREDNISONE 50 MG PO TABS: 50.0000 mg | ORAL_TABLET | Freq: Every day | ORAL | 0 refills | Status: AC

## 2020-12-07 MED ORDER — LISINOPRIL 10 MG PO TABS: ORAL_TABLET | ORAL | 1 refills | Status: DC

## 2020-12-07 MED ORDER — FUROSEMIDE 20 MG PO TABS: 40.0000 mg | ORAL_TABLET | Freq: Every day | ORAL | 0 refills | Status: DC

## 2020-12-07 NOTE — Discharge Summary (Signed)
Discharge Summary  Melvin Burns ZOX:[id number] DOB: 1966-06-09  PCP: Chilton Si, MD  Admit date: 12/04/2020 Discharge date: 12/07/2020  Time spent: 40 mins   Recommendations for Outpatient Follow-up:  1. PCP/cardiology in 1 week 2. Coumadin clinic to repeat INR    Discharge Diagnoses:  Active Hospital Problems   Diagnosis Date Noted  . Acute respiratory failure with hypoxia (HCC) 12/04/2020  . COVID-19 virus infection 12/04/2020  . PAF (paroxysmal atrial fibrillation) (HCC) 03/30/2020  . 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 Essential hypertension 03/11/2013    Resolved Hospital Problems  No resolved problems to display.    Discharge Condition: Stable  Diet recommendation: Heart healthy  Vitals:   12/06/20 2110 12/07/20 0603  BP: 118/86 122/83  Pulse: 74 88  Resp: 20   Temp: 98.1 F (36.7 C) 97.9 F (36.6 C)  SpO2: 99% 97%    History of present illness:  55 year old malewho has been vaccinated x 3 against COVID 19with a history of hypertension, rheumatic heart disease s/p mechanical mitral valve, paroxysmal atrial fibrillation on coumadin and aspirin, tobacco abuse, who presented to the ED with complaints of SOB.Was in his usual state of health until he noticed he had dyspnea on exertion at work. Last night he had sudden onset orthopnea when he went to bed which is unusual for him and prompted him to come to the ED. Admits to change in taste over the past week and decreased appetite. Denies chest pain, palpitations, weight gain, lower extremity edema, myalgias, fever. No known sick contacts. Quit tobacco use 1 year ago. Feeling better since getting lasix in the ED. In the ED, Afebrile, tachycardic, tachypneic, hypoxic (SpO2 81% on room air) placed on4L/min. Notable Labs: BNP 180, Lactic acid 2.5, WBC 21 (chronically elevated), INR 2.3, D dimer 1.67, LDH 535, COVID 19 positive. Notable Imaging:CXR -  consistent with CHF with interstitial and alveolar edema. Patient receivedalbuterol, lasix.  Patient admitted for further management     Today, patient denies any new complaints, denies any worsening shortness of breath, chest pain, denies any cough, fever/chills.  Discussed extensively with patient about discharge plans, to follow-up with Coumadin clinic in a day or 2 to repeat INR, follow-up with PCP/cardiology within 1 week.  Patient advised to be compliant with his medications.  Patient to isolate for a total of 10-21 days from positive test.    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   PAF (paroxysmal atrial fibrillation) (HCC)   COVID-19 virus infection   Acute hypoxic respiratory failure likely 2/2 ?flash pulmonary edema Vs acute on chronic diastolic HF On presentation, noted to be hypoxic to about 81% on room air, noted uncontrolled BP On room air, weaned off O2 BNP 180 Troponin WNL, EKG NSR Chest x-ray with pulmonary edema, repeat with marked improvement Echo showed EF of 60 to 65%, no regional wall motion abnormalities, left ventricular diastolic parameters are indeterminate Continue Lasix Follow up with PCP/cardiology  COVID-19 infection Currently afebrile Completed remdesivir X 3 days Continue steroids for a total of 7 days Follow up with PCP  Hypertension BP stable Continue amlodipine, nebivolol, lisinopril   Paroxysmal A. Fib Noted to be in sinus rhythm on admission, rate controlled Continue warfarin, nebivolol INR 4 upon discharge, advised patient to follow-up with Coumadin clinic on hold tonight's dose as per pharmacy  Rheumatic heart disease s/p mechanical mitral valve Repeat echo showed mechanical valve in position and functioning well Continue warfarin  Normocytic anemia Baseline  hemoglobin between 9-10 Anemia panel WNL Daily CBC  Chronic leukocytosis (worsened as pt on steroids) Pathologist to review smear pending result May need Outpatient follow-up with heme-onc      Estimated body mass index is 25.78 kg/m as calculated from the following:   Height as of this encounter:  (1.753 m).   Weight as of this encounter: 79.2 kg.    Procedures:  None  Consultations:  None  Discharge Exam: BP 122/83 (BP Location: Left Arm)   Pulse 88   Temp 97.9 F (36.6 C) (Oral)   Resp 20   Ht  (1.753 m)   Wt 79.2 kg   SpO2 97%   BMI 25.78 kg/m   General: NAD Cardiovascular: S1, S2 present Respiratory: CTA B      Discharge Instructions You were cared for by a hospitalist during your hospital stay. If you have any questions about your discharge medications or the care you received while you were in the hospital after you are discharged, you can call the unit and asked to speak with the hospitalist on call if the hospitalist that took care of you is not available. Once you are discharged, your primary care physician will handle any further medical issues. Please note that NO REFILLS for any discharge medications will be authorized once you are discharged, as it is imperative that you return to your primary care physician (or establish a relationship with a primary care physician if you do not have one) for your aftercare needs so that they can reassess your need for medications and monitor your lab values.  Discharge Instructions    Diet - low sodium heart healthy   Complete by: As directed    Increase activity slowly   Complete by: As directed    Increase activity slowly   Complete by: As directed      Allergies as of 12/07/2020   No Known Allergies     Medication List    TAKE these medications   amLODipine 10 MG tablet Commonly known as: NORVASC Take 1 tablet (10 mg total) by mouth daily.   aspirin EC 81 MG tablet Take 1 tablet (81 mg  total) by mouth daily.   furosemide 20 MG tablet Commonly known as: Lasix Take 2 tablets (40 mg total) by mouth daily.   lisinopril 10 MG tablet Commonly known as: ZESTRIL Take 1 tablet (10 mg total) by mouth daily - in the evenings   nebivolol 5 MG tablet Commonly known as: Bystolic Take 1 tablet (5 mg total) by mouth daily.   predniSONE 50 MG tablet Commonly known as: DELTASONE Take 1 tablet (50 mg total) by mouth daily for 3 days. Start taking on: December 08, 2020   Repatha SureClick 140 MG/ML Soaj Generic drug: Evolocumab Inject 140 mg into the skin every 14 (fourteen) days.   warfarin 5 MG tablet Commonly known as: COUMADIN Take as directed. If you are unsure how to take this medication, talk to your nurse or doctor. Original instructions: Take 1 tablet (5 mg total) by mouth daily at 4 PM. What changed:   how much to take  when to take this  additional instructions      No Known Allergies  Follow-up Information    Chilton Si, MD. Schedule an appointment as soon as possible for a visit in 1 week(s).  Specialty: Cardiology Contact information: [address] [place] [address] [phone]                The results of significant diagnostics from this hospitalization (including imaging, microbiology, ancillary and laboratory) are listed below for reference.    Significant Diagnostic Studies: DG Chest 2 View  Result Date: 12/04/2020 CLINICAL DATA:  Shortness of breath, worsening over the last week. EXAM: CHEST - 2 VIEW COMPARISON:  04/06/2020 FINDINGS: Heart size is normal. Previous mitral valve replacement. Interstitial and alveolar pulmonary edema is present. Fluid in the fissures. Findings consistent with acute congestive heart failure. No consolidation or collapse. No acute bone finding. IMPRESSION: Congestive heart failure with interstitial and alveolar edema. Electronically Signed   By: Paulina Fusi M.D.   On: 12/04/2020 02:39    DG Chest Port 1 View  Addendum Date: 12/07/2020   ADDENDUM REPORT: 12/07/2020 11:12 ADDENDUM: Voice recognition error: The third sentence of the Findings section should read "previous interstitial pattern is markedly decreased. " First impression should stay marked decrease in interstitial pattern, consistent with resolving congestive heart failure. Electronically Signed   By: Marin Roberts M.D.   On: 12/07/2020 11:12   Result Date: 12/07/2020 CLINICAL DATA:  Pulmonary edema EXAM: PORTABLE CHEST 1 VIEW COMPARISON:  Two-view chest x-ray 12/04/2020. FINDINGS: Heart size is normal. Aortic valve noted. Previous interstitial pattern is markedly increased. Minimal bibasilar atelectasis remains. Lung volumes are low. IMPRESSION: 1. Marked increase in interstitial pattern, consistent with congestive heart failure. 2. Minimal bibasilar atelectasis. Electronically Signed: By: Marin Roberts M.D. On: 12/07/2020 07:58   ECHOCARDIOGRAM COMPLETE  Result Date: 12/04/2020    ECHOCARDIOGRAM REPORT   Patient Name:   Melvin Burns Date of Exam: 12/04/2020 Medical Rec #:  [id number]           Height:       69.0 in Accession #:    [id number]          Weight:       180.0 lb Date of Birth:  08-Jul-1966          BSA:          1.976 m Patient Age:    54 years            BP:           105/66 mmHg Patient Gender: M                   HR:           77 bpm. Exam Location:  Inpatient Procedure: 2D Echo, 3D Echo, Cardiac Doppler and Color Doppler Indications:    I50.23 Acute on chronic systolic (congestive) heart failure  History:        Patient has prior history of Echocardiogram examinations, most                 recent 05/08/2020. Mitral Valve Disease and Aortic Valve Disease;                 Risk Factors:Hypertension.                  Mitral Valve: 33 mm Sorin [employer] mechanical valve valve is                 present in the mitral position. Procedure Date: 03/06/2020.  Sonographer:    Elmarie Shiley Dance Referring  Phys: [id number] JARED E SEGAL IMPRESSIONS  1. Left ventricular ejection fraction, by estimation, is  60 to 65%. The left ventricle has normal function. The left ventricle has no regional wall motion abnormalities. There is mild left ventricular hypertrophy. Left ventricular diastolic parameters are indeterminate.  2. Right ventricular systolic function is normal. The right ventricular size is normal.  3. Left atrial size was mildly dilated.  4. The mitral valve has been repaired/replaced. No evidence of mitral valve regurgitation. No evidence of mitral stenosis. There is a 33 mm [employer] mechanical valve present in the mitral position. Procedure Date: 03/06/2020. Echo findings are consistent with normal structure and function of the mitral valve prosthesis.  5. The aortic valve is normal in structure. Aortic valve regurgitation is moderate. No aortic stenosis is present.  6. The inferior vena cava is normal in size with greater than 50% respiratory variability, suggesting right atrial pressure of 3 mmHg. FINDINGS  Left Ventricle: Left ventricular ejection fraction, by estimation, is 60 to 65%. The left ventricle has normal function. The left ventricle has no regional wall motion abnormalities. The left ventricular internal cavity size was normal in size. There is  mild left ventricular hypertrophy. Left ventricular diastolic parameters are indeterminate. Right Ventricle: The right ventricular size is normal. No increase in right ventricular wall thickness. Right ventricular systolic function is normal. Left Atrium: Left atrial size was mildly dilated. Right Atrium: Right atrial size was normal in size. Pericardium: There is no evidence of pericardial effusion. Mitral Valve: The mitral valve has been repaired/replaced. No evidence of mitral valve regurgitation. There is a 33 mm [employer]in [employer] mechanical valve present in the mitral position. Procedure Date: 03/06/2020. Echo findings are consistent with normal   structure and function of the mitral valve prosthesis. No evidence of mitral valve stenosis. MV peak gradient, 18.2 mmHg. The mean mitral valve gradient is 6.5 mmHg. Tricuspid Valve: The tricuspid valve is normal in structure. Tricuspid valve regurgitation is not demonstrated. No evidence of tricuspid stenosis. Aortic Valve: The aortic valve is normal in structure. Aortic valve regurgitation is moderate. Aortic regurgitation PHT measures 359 msec. No aortic stenosis is present. Aortic valve mean gradient measures 11.0 mmHg. Aortic valve peak gradient measures 20.5 mmHg. Aortic valve area, by VTI measures 1.27 cm. Pulmonic Valve: The pulmonic valve was normal in structure. Pulmonic valve regurgitation is not visualized. No evidence of pulmonic stenosis. Aorta: The aortic root is normal in size and structure. Venous: The inferior vena cava is normal in size with greater than 50% respiratory variability, suggesting right atrial pressure of 3 mmHg. IAS/Shunts: No atrial level shunt detected by color flow Doppler.  LEFT VENTRICLE PLAX 2D LVIDd:         5.30 cm LVIDs:         3.80 cm LV PW:         1.20 cm LV IVS:        1.00 cm LVOT diam:     1.90 cm LV SV:         54 LV SV Index:   27 LVOT Area:     2.84 cm  RIGHT VENTRICLE             IVC RV Mid diam:    1.70 cm     IVC diam: 1.10 cm RV S prime:     11.70 cm/s TAPSE (M-mode): 2.0 cm LEFT ATRIUM             Index       RIGHT ATRIUM           Index LA  diam:        5.30 cm 2.68 cm/m  RA Area:     13.60 cm LA Vol (A2C):   61.0 ml 30.88 ml/m RA Volume:   26.50 ml  13.41 ml/m LA Vol (A4C):   79.2 ml 40.09 ml/m LA Biplane Vol: 70.9 ml 35.89 ml/m  AORTIC VALVE AV Area (Vmax):    1.20 cm AV Area (Vmean):   1.26 cm AV Area (VTI):     1.27 cm AV Vmax:           226.50 cm/s AV Vmean:          154.000 cm/s AV VTI:            0.420 m AV Peak Grad:      20.5 mmHg AV Mean Grad:      11.0 mmHg LVOT Vmax:         95.60 cm/s LVOT Vmean:        68.200 cm/s LVOT VTI:           0.189 m LVOT/AV VTI ratio: 0.45 AI PHT:            359 msec  AORTA Ao Root diam: 2.90 cm Ao Asc diam:  3.30 cm MITRAL VALVE MV Area (PHT): 3.17 cm     SHUNTS MV Area VTI:   1.12 cm     Systemic VTI:  0.19 m MV Peak grad:  18.2 mmHg    Systemic Diam: 1.90 cm MV Mean grad:  6.5 mmHg MV Vmax:       2.13 m/s MV Vmean:      118.0 cm/s MV Decel Time: 239 msec MV E velocity: 203.00 cm/s MV A velocity: 93.80 cm/s MV E/A ratio:  2.16 Donato Schultz MD Electronically signed by Donato Schultz MD Signature Date/Time: 12/04/2020/3:55:20 PM    Final     Microbiology: Recent Results (from the past 240 hour(s))  Resp Panel by RT-PCR (Flu A&B, Covid) Nasopharyngeal Swab     Status: Abnormal   Collection Time: 12/04/20  2:47 AM   Specimen: Nasopharyngeal Swab; Nasopharyngeal(NP) swabs in vial transport medium  Result Value Ref Range Status   SARS Coronavirus 2 by RT PCR POSITIVE (A) NEGATIVE Final    Comment: RESULT CALLED TO, READ BACK BY AND VERIFIED WITH: HAILEY, RN @ 0522 ON 12/04/20 C VARNER (NOTE) SARS-CoV-2 target nucleic acids are DETECTED.  The SARS-CoV-2 RNA is generally detectable in upper respiratory specimens during the acute phase of infection. Positive results are indicative of the presence of the identified virus, but do not rule out bacterial infection or co-infection with other pathogens not detected by the test. Clinical correlation with patient history and other diagnostic information is necessary to determine patient infection status. The expected result is Negative.  Fact Sheet for Patients: BloggerCourse.com  Fact Sheet for Healthcare Providers: SeriousBroker.it  This test is not yet approved or cleared by the [place] FDA and  has been authorized for detection and/or diagnosis of SARS-CoV-2 by FDA under an Emergency Use Authorization (EUA).  This EUA will remain in effect (meaning this test ca n be used) for the duration of  the  COVID-19 declaration under Section 564(b)(1) of the Act, 21 U.S.C. section 360bbb-3(b)(1), unless the authorization is terminated or revoked sooner.     Influenza A by PCR NEGATIVE NEGATIVE Final   Influenza B by PCR NEGATIVE NEGATIVE Final    Comment: (NOTE) The Xpert Xpress SARS-CoV-2/FLU/RSV plus assay is intended as an aid in the  diagnosis of influenza from Nasopharyngeal swab specimens and should not be used as a sole basis for treatment. Nasal washings and aspirates are unacceptable for Xpert Xpress SARS-CoV-2/FLU/RSV testing.  Fact Sheet for Patients: BloggerCourse.com  Fact Sheet for Healthcare Providers: SeriousBroker.it  This test is not yet approved or cleared by the [place] FDA and has been authorized for detection and/or diagnosis of SARS-CoV-2 by FDA under an Emergency Use Authorization (EUA). This EUA will remain in effect (meaning this test can be used) for the duration of the COVID-19 declaration under Section 564(b)(1) of the Act, 21 U.S.C. section 360bbb-3(b)(1), unless the authorization is terminated or revoked.  Performed at [facility], [address], [place], [place] [address]   Blood culture (routine single)     Status: None (Preliminary result)   Collection Time: 12/04/20  2:47 AM   Specimen: BLOOD  Result Value Ref Range Status   Specimen Description   Final    BLOOD LEFT ANTECUBITAL Performed at [facility], [address], [place], [place] [address]    Special Requests   Final    BOTTLES DRAWN AEROBIC AND ANAEROBIC Blood Culture results may not be optimal due to an inadequate volume of blood received in culture bottles Performed at [facility], [address] [place], [place] [address]    Culture   Final    NO GROWTH 2 DAYS Performed at [facility], [address], [place], [place] [address]    Report Status PENDING   Incomplete  Urine culture     Status: None   Collection Time: 12/04/20  2:47 AM   Specimen: In/Out Cath Urine  Result Value Ref Range Status   Specimen Description   Final    IN/OUT CATH URINE Performed at [facility], [address] [place], [place] [address]    Special Requests   Final    NONE Performed at [facility], [address], [place], [place] [address]    Culture   Final    NO GROWTH Performed at [facility], [address] [place], [place] [address]    Report Status 12/05/2020 FINAL  Final     Labs: Basic Metabolic Panel: Recent Labs  Lab 12/04/20 0247 12/05/20 0313 12/06/20 0348 12/07/20 0325  NA 137 138 137 139  K 4.0 4.2 4.1 4.2  CL 104 104 101 103  CO2 23 25 26 27   GLUCOSE 118* 149* 139* 120*  BUN 9 17 20  26*  CREATININE 0.88 0.76 0.89 0.86  CALCIUM 9.1 9.8 9.8 9.3  MG  --  2.2 2.3 2.3   Liver Function Tests: Recent Labs  Lab 12/04/20 0247 12/05/20 0313 12/06/20 0348 12/07/20 0325  AST 32 21 24 23   ALT 12 12 13 12   ALKPHOS 77 62 62 67  BILITOT 0.9 1.2 1.0 0.7  PROT 8.7* 8.2* 7.9 7.6  ALBUMIN 3.9 3.5 3.5 3.4*   No results for input(s): LIPASE, AMYLASE in the last 168 hours. No results for input(s): AMMONIA in the last 168 hours. CBC: Recent Labs  Lab 12/04/20 0247 12/05/20 0313 12/06/20 0348 12/07/20 0325  WBC 21.7* 19.6* 34.7* 32.2*  NEUTROABS 17.2* 18.0* 31.5* 26.2*  HGB 10.2* 9.6* 10.1* 10.4*  HCT 33.0* 31.3* 32.8* 33.5*  MCV 88.5 86.7 88.2 87.0  PLT 356 313 342 376   Cardiac Enzymes: No results for input(s): CKTOTAL, CKMB, CKMBINDEX, TROPONINI in the last 168 hours. BNP: BNP (last  3 results) Recent Labs    12/04/20 0247  BNP 180.9*    ProBNP (last 3 results) No results for input(s): PROBNP in the last 8760 hours.  CBG: No results for input(s): GLUCAP in the last 168 hours.     Signed:  Briant Cedar, MD [facility] 12/07/2020, 12:27 PM

## 2020-12-07 NOTE — Telephone Encounter (Signed)
   Pt c/o medication issue:  1. Name of Medication:   Warfarin    2. How are you currently taking this medication (dosage and times per day)?   3. Are you having a reaction (difficulty breathing--STAT)?   4. What is your medication issue? Pt said he was recently in the hospital and his INR is at 2.5-3 he was told not to take his coumadin today. He would like for Dr. Duke Salvia and coumadin clinic know

## 2020-12-07 NOTE — Progress Notes (Signed)
Discharge instructions given with stated understandng

## 2020-12-07 NOTE — Plan of Care (Signed)
  Problem: Respiratory: Goal: Will maintain a patent airway Outcome: Adequate for Discharge Goal: Complications related to the disease process, condition or treatment will be avoided or minimized Outcome: Adequate for Discharge   Problem: Clinical Measurements: Goal: Will remain free from infection Outcome: Adequate for Discharge Goal: Diagnostic test results will improve Outcome: Adequate for Discharge Goal: Respiratory complications will improve Outcome: Adequate for Discharge   Problem: Activity: Goal: Risk for activity intolerance will decrease Outcome: Adequate for Discharge

## 2020-12-07 NOTE — Progress Notes (Addendum)
ANTICOAGULATION CONSULT NOTE Pharmacy Consult for warfarin Indication: MVR INR goal 2.5-3.5  No Known Allergies  Patient Measurements: Height: 5\' 9"  (175.3 cm) Weight: 79.2 kg (174 lb 9.7 oz) IBW/kg (Calculated) : 70.7  Vital Signs: Temp: 97.9 F (36.6 C) (02/28 0603) Temp Source: Oral (02/28 0603) BP: 122/83 (02/28 0603) Pulse Rate: 88 (02/28 0603)  Labs: Recent Labs    12/05/20 0313 12/06/20 0348 12/07/20 0325  HGB 9.6* 10.1* 10.4*  HCT 31.3* 32.8* 33.5*  PLT 313 342 376  LABPROT 23.9* 31.3* 37.5*  INR 2.2* 3.1* 4.0*  CREATININE 0.76 0.89 0.86    Estimated Creatinine Clearance: 95.9 mL/min (by C-G formula based on SCr of 0.86 mg/dL).  Assessment: 55 yo male admitted with COVID to continue warfarin inpatient for hx MVR followed by anticoag clinic as outpatient. Dosing per clinic note on 2/23 should be 7.5mg  daily except 5mg  on TuThSat.    INR supra-therapeutic today at 4.0  Hgb 10.4, Plts 376, no bleeding reported  Eating 100% meals  Goal of Therapy:  INR 2.5-3.5   Plan:   Hold warfarin today  Daily INR  3/23 D  12/07/2020 9:30 AM

## 2020-12-07 NOTE — Discharge Instructions (Signed)
10 Things You Can Do to Manage Your COVID-19 Symptoms at Home If you have possible or confirmed COVID-19: 1. Stay home except to get medical care. 2. Monitor your symptoms carefully. If your symptoms get worse, call your healthcare provider immediately. 3. Get rest and stay hydrated. 4. If you have a medical appointment, call the healthcare provider ahead of time and tell them that you have or may have COVID-19. 5. For medical emergencies, call 911 and notify the dispatch personnel that you have or may have COVID-19. 6. Cover your cough and sneezes with a tissue or use the inside of your elbow. 7. Wash your hands often with soap and water for at least 20 seconds or clean your hands with an alcohol-based hand sanitizer that contains at least 60% alcohol. 8. As much as possible, stay in a specific room and away from other people in your home. Also, you should use a separate bathroom, if available. If you need to be around other people in or outside of the home, wear a mask. 9. Avoid sharing personal items with other people in your household, like dishes, towels, and bedding. 10. Clean all surfaces that are touched often, like counters, tabletops, and doorknobs. Use household cleaning sprays or wipes according to the label instructions. [email or web address] 04/24/2020 This information is not intended to replace advice given to you by your health care provider. Make sure you discuss any questions you have with your health care provider. Document Revised: 08/10/2020 Document Reviewed: 08/10/2020 Elsevier Patient Education  2021 Elsevier Inc.   Heart Failure, Diagnosis  Heart failure means that your heart is not able to pump blood in the right way. This makes it hard for your body to work well. Heart failure is usually a long-term (chronic) condition. You must take good care of yourself and follow your treatment plan from your doctor. What are the causes?  High blood pressure.  Buildup of  cholesterol and fat in the arteries.  Heart attack. This injures the heart muscle.  Heart valves that do not open and close properly.  Damage of the heart muscle. This is also called cardiomyopathy.  Infection of the heart muscle. This is also called myocarditis.  Lung disease. What increases the risk?  Getting older. The risk of heart failure goes up as a person ages.  Being overweight.  Being male.  Use tobacco or nicotine products.  Abusing alcohol or drugs.  Having taken medicines that can damage the heart.  Having any of these conditions: ? Diabetes. ? Abnormal heart rhythms. ? Thyroid problems. ? Low blood counts (anemia).  Having a family history of heart failure. What are the signs or symptoms?  Shortness of breath.  Coughing.  Swelling of the feet, ankles, legs, or belly.  Losing or gaining weight for no reason.  Trouble breathing.  Waking from sleep because of the need to sit up and get more air.  Fast heartbeat.  Being very tired.  Feeling dizzy, or feeling like you may pass out (faint).  Having no desire to eat.  Feeling like you may vomit (nauseous).  Peeing (urinating) more at night.  Feeling confused. How is this treated? This condition may be treated with:  Medicines. These can be given to treat blood pressure and to make the heart muscles stronger.  Changes in your daily life. These may include: ? Eating a healthy diet. ? Staying at a healthy body weight. ? Quitting tobacco, alcohol, and drug use. ? Doing exercises. ? Participating in a  cardiac rehabilitation program. This program helps you improve your health through exercise, education, and counseling.  Surgery. Surgery can be done to open blocked valves, or to put devices in the heart, such as pacemakers.  A donor heart (heart transplant). You will receive a healthy heart from a donor. Follow these instructions at home:  Treat other conditions as told by your doctor.  These may include high blood pressure, diabetes, thyroid disease, or abnormal heart rhythms.  Learn as much as you can about heart failure.  Get support as you need it.  Keep all follow-up visits. Summary  Heart failure means that your heart is not able to pump blood in the right way.  This condition is often caused by high blood pressure, heart attack, or damage of the heart muscle.  Symptoms of this condition include shortness of breath and swelling of the feet, ankles, legs, or belly. You may also feel very tired or feel like you may vomit.  You may be treated with medicines, surgery, or changes in your daily life.  Treat other health conditions as told by your doctor. This information is not intended to replace advice given to you by your health care provider. Make sure you discuss any questions you have with your health care provider. Document Revised: 04/18/2020 Document Reviewed: 04/18/2020 Elsevier Patient Education  2021 [employer].

## 2020-12-08 NOTE — Telephone Encounter (Signed)
Noted.  Patient discharged from hospital with COVID diagnosis and prednisone 50 mg qd x 3.  Next INR scheduled for March 16

## 2020-12-09 LAB — CULTURE, BLOOD (SINGLE): Culture: NO GROWTH

## 2020-12-17 ENCOUNTER — Other Ambulatory Visit: Payer: Self-pay

## 2020-12-17 ENCOUNTER — Encounter (HOSPITAL_COMMUNITY): Payer: Self-pay

## 2020-12-17 ENCOUNTER — Emergency Department (HOSPITAL_COMMUNITY): Payer: 59

## 2020-12-17 ENCOUNTER — Emergency Department (HOSPITAL_COMMUNITY)
Admission: EM | Admit: 2020-12-17 | Discharge: 2020-12-17 | Disposition: A | Discharge status: Home / Self Care | Payer: 59 | Source: Home / Self Care | Attending: Emergency Medicine | Admitting: Emergency Medicine

## 2020-12-17 DIAGNOSIS — I10 Essential (primary) hypertension: Secondary | ICD-10-CM | POA: Insufficient documentation

## 2020-12-17 DIAGNOSIS — I110 Hypertensive heart disease with heart failure: Secondary | ICD-10-CM | POA: Diagnosis not present

## 2020-12-17 DIAGNOSIS — Z7982 Long term (current) use of aspirin: Secondary | ICD-10-CM | POA: Insufficient documentation

## 2020-12-17 DIAGNOSIS — Z7901 Long term (current) use of anticoagulants: Secondary | ICD-10-CM | POA: Insufficient documentation

## 2020-12-17 DIAGNOSIS — Z87891 Personal history of nicotine dependence: Secondary | ICD-10-CM | POA: Insufficient documentation

## 2020-12-17 DIAGNOSIS — R0602 Shortness of breath: Secondary | ICD-10-CM | POA: Insufficient documentation

## 2020-12-17 DIAGNOSIS — Z79899 Other long term (current) drug therapy: Secondary | ICD-10-CM | POA: Insufficient documentation

## 2020-12-17 DIAGNOSIS — Z8616 Personal history of COVID-19: Secondary | ICD-10-CM | POA: Insufficient documentation

## 2020-12-17 LAB — BASIC METABOLIC PANEL
Anion gap: 8 (ref 5–15)
BUN: 12 mg/dL (ref 6–20)
CO2: 25 mmol/L (ref 22–32)
Calcium: 8.9 mg/dL (ref 8.9–10.3)
Chloride: 104 mmol/L (ref 98–111)
Creatinine, Ser: 0.84 mg/dL (ref 0.61–1.24)
GFR, Estimated: >60 mL/min (ref >60)
Glucose, Bld: 109 mg/dL — ABNORMAL HIGH (ref 70–99)
Potassium: 3.9 mmol/L (ref 3.5–5.1)
Sodium: 137 mmol/L (ref 135–145)

## 2020-12-17 LAB — CBC WITH DIFFERENTIAL/PLATELET
Abs Immature Granulocytes: 0.11 10*3/uL — ABNORMAL HIGH (ref 0.00–0.07)
Basophils Absolute: 0.1 10*3/uL (ref 0.0–0.1)
Basophils Relative: 1 %
Eosinophils Absolute: 0.3 10*3/uL (ref 0.0–0.5)
Eosinophils Relative: 2 %
HCT: 29.3 % — ABNORMAL LOW (ref 39.0–52.0)
Hemoglobin: 9 g/dL — ABNORMAL LOW (ref 13.0–17.0)
Immature Granulocytes: 1 %
Lymphocytes Relative: 14 %
Lymphs Abs: 2.4 10*3/uL (ref 0.7–4.0)
MCH: 27.5 pg (ref 26.0–34.0)
MCHC: 30.7 g/dL (ref 30.0–36.0)
MCV: 89.6 fL (ref 80.0–100.0)
Monocytes Absolute: 1.6 10*3/uL — ABNORMAL HIGH (ref 0.1–1.0)
Monocytes Relative: 9 %
Neutro Abs: 13.1 10*3/uL — ABNORMAL HIGH (ref 1.7–7.7)
Neutrophils Relative %: 73 %
Platelets: 271 10*3/uL (ref 150–400)
RBC: 3.27 MIL/uL — ABNORMAL LOW (ref 4.22–5.81)
RDW: 17.6 % — ABNORMAL HIGH (ref 11.5–15.5)
WBC: 17.5 10*3/uL — ABNORMAL HIGH (ref 4.0–10.5)
nRBC: 0 % (ref 0.0–0.2)

## 2020-12-17 LAB — PROTIME-INR
INR: 3.2 — ABNORMAL HIGH (ref 0.8–1.2)
Prothrombin Time: 31.6 seconds — ABNORMAL HIGH (ref 11.4–15.2)

## 2020-12-17 NOTE — ED Triage Notes (Signed)
Pt reports waking up short of breath. Pt tested positive for covid 13 days ago and admitted. Started on Furosemide and sts he has been compliant.

## 2020-12-17 NOTE — Discharge Instructions (Addendum)
Follow-up by calling the below phone number to set an appointment with cardiology.       Follow-up Information         Chilton Si, MD. Schedule an appointment as soon as possible for a visit in 1 week(s).   Specialty: Cardiology Contact information: [address] [place] [address] [phone]

## 2020-12-17 NOTE — ED Triage Notes (Signed)
Hands very cold. 02 sensor placed on forehead.

## 2020-12-17 NOTE — ED Provider Notes (Signed)
[facility] Provider Note   CSN: [id number] Arrival date & time: 12/17/20  7035     History Chief Complaint  Patient presents with  . Shortness of Breath    [facility] is a 55 y.o. male.   Shortness of Breath Severity:  Mild Onset quality:  Gradual Timing:  Intermittent Progression:  Waxing and waning Chronicity:  Recurrent Context: URI (recent covid)   Relieved by:  Nothing Worsened by:  Nothing Ineffective treatments:  None tried Associated symptoms: PND   Associated symptoms: no chest pain, no cough, no fever, no headaches, no rash, no sputum production, no vomiting and no wheezing        Past Medical History:  Diagnosis Date  . Hypertension   . Incidental pulmonary nodule 01/08/2020   Multiple in right lung - need f/u CT 1 year due to h/o smoking  . Mild aortic stenosis   . Mitral valve stenosis, severe   . Moderate mitral regurgitation   . Pneumonia    x 1  . Rheumatic mitral stenosis with regurgitation    Echo 08/08/16:  Severe mitral stenosis (mean gradient 17 mmHg).  Moderate mitral regurgitation.  . 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 Wears glasses   . Wears partial dentures    upper    Patient Active Problem List   Diagnosis Date Noted  . Acute respiratory failure with hypoxia (HCC) 12/04/2020  . COVID-19 virus infection 12/04/2020  . PAF (paroxysmal atrial fibrillation) (HCC) 03/30/2020  . Bacteremia due to Enterobacter species 03/13/2020  . IV site infection (HCC) 03/13/2020  . 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 Incidental pulmonary nodule 01/08/2020  . Pre-operative clearance 06/11/2019  . Mitral valve stenosis, severe 08/15/2017  . Moderate mitral regurgitation 08/15/2017  . Abdominal pain, epigastric  01/02/2017  . Non-intractable vomiting with nausea 01/02/2017  . 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 Family history of stroke 03/11/2013  . Essential hypertension 03/11/2013  . Smoker 03/11/2013  . Chronic periodontitis, unspecified 03/11/2013    Past Surgical History:  Procedure Laterality Date  . BILATERAL KNEE ARTHROSCOPY    . BUBBLE STUDY  12/16/2019   Procedure: BUBBLE STUDY;  Surgeon: [name], MD;  Location: [facility] ENDOSCOPY;  Service: Cardiology;;  . MITRAL VALVE REPLACEMENT Right 03/06/2020   Procedure: MINIMALLY INVASIVE MITRAL VALVE (MV) REPLACEMENT using Carbomedics Optiform 33 MM Mitral Bioprosthetic Valve.;  Surgeon: Purcell Nails, MD;  Location: [facility] OR;  Service: Open Heart Surgery;  Laterality: Right;  . MULTIPLE EXTRACTIONS WITH ALVEOLOPLASTY N/A 12/31/2019   Procedure: Extraction of tooth #'s 16-17, 20-23, and 28-30 with alveoloplasty, Bilateral mandibular tori reductions, and lateral exostoses reductions.;  Surgeon: Charlynne Pander, DDS;  Location: [facility] OR;  Service: Oral Surgery;  Laterality: N/A;  . RIGHT/LEFT HEART CATH AND CORONARY ANGIOGRAPHY N/A 12/16/2019   Procedure: RIGHT/LEFT HEART CATH AND CORONARY ANGIOGRAPHY;  Surgeon: Yvonne Kendall, MD;  Location: [facility] INVASIVE CV LAB;  Service: Cardiovascular;  Laterality: N/A;  . TEE WITHOUT CARDIOVERSION N/A 12/16/2019   Procedure: TRANSESOPHAGEAL ECHOCARDIOGRAM (TEE);  Surgeon: [name], MD;  Location: [facility] ENDOSCOPY;  Service: Cardiology;  Laterality: N/A;  . TEE WITHOUT CARDIOVERSION N/A 03/06/2020   Procedure: TRANSESOPHAGEAL ECHOCARDIOGRAM (TEE);  Surgeon: Purcell Nails, MD;  Location: [facility] OR;  Service: Open Heart Surgery;  Laterality: N/A;  Family History  Problem Relation Age of Onset  . Hypertension Mother   . Hypertension Father   . Stroke Father     Social History   Tobacco Use  . Smoking status: Former Smoker    Packs/day: 1.00    Years: 35.00    Pack years:  35.00    Types: Cigarettes    Quit date: 12/09/2019    Years since quitting: 1.0  . Smokeless tobacco: Never Used  Vaping Use  . Vaping Use: Never used  Substance Use Topics  . Alcohol use: No  . Drug use: No    Home Medications Prior to Admission medications   Medication Sig Start Date End Date Taking? Authorizing Provider  amLODipine (NORVASC) 10 MG tablet Take 1 tablet (10 mg total) by mouth daily. 07/01/20 09/29/20  Chilton Siandolph, Tiffany, MD  aspirin EC 81 MG tablet Take 1 tablet (81 mg total) by mouth daily. 12/16/19   End, Cristal Deerhristopher, MD  Evolocumab (REPATHA SURECLICK) 140 MG/ML SOAJ Inject 140 mg into the skin every 14 (fourteen) days. Patient not taking: No sig reported 10/08/20   Chilton Siandolph, Tiffany, MD  furosemide (LASIX) 20 MG tablet Take 2 tablets (40 mg total) by mouth daily. 12/07/20 01/06/21  Briant CedarEzenduka, Nkeiruka J, MD  lisinopril (ZESTRIL) 10 MG tablet Take 1 tablet (10 mg total) by mouth daily - in the evenings 12/07/20   Briant CedarEzenduka, Nkeiruka J, MD  nebivolol (BYSTOLIC) 5 MG tablet Take 1 tablet (5 mg total) by mouth daily. 07/01/20   Chilton Siandolph, Tiffany, MD  warfarin (COUMADIN) 5 MG tablet Take 1 tablet (5 mg total) by mouth daily at 4 PM. Patient taking differently: Take 5-7.5 mg by mouth See admin instructions. Take 1 tablet by mouth on Tues, Thurs, and Sat. All other days take 1 & 1/2 tablet. 07/28/20   Chilton Siandolph, Tiffany, MD    Allergies    Patient has no known allergies.  Review of Systems   Review of Systems  Constitutional: Negative for chills and fever.  HENT: Negative for congestion and rhinorrhea.   Respiratory: Positive for shortness of breath. Negative for cough, sputum production and wheezing.   Cardiovascular: Positive for PND. Negative for chest pain and palpitations.  Gastrointestinal: Negative for diarrhea, nausea and vomiting.  Genitourinary: Negative for difficulty urinating and dysuria.  Musculoskeletal: Negative for arthralgias and back pain.  Skin: Negative  for color change and rash.  Neurological: Negative for light-headedness and headaches.    Physical Exam Updated Vital Signs BP 103/75   Pulse 77   Temp 98.3 F (36.8 C) (Oral)   Resp 14   Ht 5\' 9"  (1.753 m)   Wt 81.6 kg   SpO2 96%   BMI 26.58 kg/m   Physical Exam Vitals and nursing note reviewed.  Constitutional:      General: He is not in acute distress.    Appearance: Normal appearance.  HENT:     Head: Normocephalic and atraumatic.     Nose: No rhinorrhea.  Eyes:     General:        Right eye: No discharge.        Left eye: No discharge.     Conjunctiva/sclera: Conjunctivae normal.  Cardiovascular:     Rate and Rhythm: Normal rate and regular rhythm.  Pulmonary:     Effort: Pulmonary effort is normal.     Breath sounds: No stridor. No decreased breath sounds, wheezing or rhonchi.  Abdominal:     General: Abdomen is flat. There is no distension.  Palpations: Abdomen is soft.  Musculoskeletal:        General: No deformity or signs of injury.     Right lower leg: No edema.     Left lower leg: No edema.  Skin:    General: Skin is warm and dry.  Neurological:     General: No focal deficit present.     Mental Status: He is alert. Mental status is at baseline.     Motor: No weakness.  Psychiatric:        Mood and Affect: Mood normal.        Behavior: Behavior normal.        Thought Content: Thought content normal.     ED Results / Procedures / Treatments   Labs (all labs ordered are listed, but only abnormal results are displayed) Labs Reviewed  CBC WITH DIFFERENTIAL/PLATELET - Abnormal; Notable for the following components:      Result Value   WBC 17.5 (*)    RBC 3.27 (*)    Hemoglobin 9.0 (*)    HCT 29.3 (*)    RDW 17.6 (*)    Neutro Abs 13.1 (*)    Monocytes Absolute 1.6 (*)    Abs Immature Granulocytes 0.11 (*)    All other components within normal limits  BASIC METABOLIC PANEL - Abnormal; Notable for the following components:   Glucose, Bld  109 (*)    All other components within normal limits  PROTIME-INR - Abnormal; Notable for the following components:   Prothrombin Time 31.6 (*)    INR 3.2 (*)    All other components within normal limits    EKG EKG Interpretation  Date/Time:  Thursday December 17 2020 08:59:55 EST Ventricular Rate:  78 PR Interval:    QRS Duration: 100 QT Interval:  407 QTC Calculation: 464 R Axis:   38 Text Interpretation: Sinus rhythm Atrial premature complex Prolonged PR interval Probable left atrial enlargement Abnormal R-wave progression, early transition Left ventricular hypertrophy Confirmed by Cherlynn Perches ([id number]) on 12/17/2020 9:17:47 AM   Radiology DG Chest 2 View  Result Date: 12/17/2020 CLINICAL DATA:  55 year old male with COVID-19 diagnosed 13 days ago. Shortness of breath. EXAM: CHEST - 2 VIEW COMPARISON:  Portable chest 12/07/2020 and earlier. FINDINGS: PA and lateral views. Stable lung volumes and mediastinal contours. Prior cardiac valve replacement. Visualized tracheal air column is within normal limits. No pneumothorax or layering pleural effusion. Trace fluid in the pulmonary fissures is decreased compared to last month. Perihilar and diffuse coarse pulmonary interstitial opacity has recurred and closely resembles that on 12/04/2020. These changes are new since 2021. No consolidation. No osseous abnormality identified.  Negative visible bowel gas. IMPRESSION: Recurrent perihilar and widespread coarse pulmonary interstitial opacity, closely resembling that on 12/04/2020, which was improved on 12/07/2020. Main differential considerations are Recurrent Pulmonary Edema, progressive sequelae of COVID-19. Electronically Signed   By: Odessa Fleming M.D.   On: 12/17/2020 07:18    Procedures Procedures   Medications Ordered in ED Medications - No data to display  ED Course  I have reviewed the triage vital signs and the nursing notes.  Pertinent labs & imaging results that were available during my  care of the patient were reviewed by me and considered in my medical decision making (see chart for details).    MDM Rules/Calculators/A&P                          Patient with recent admission for  Covid comes with shortness of breath that wakes him up at night.  He is able to exert himself during the day he has no difficulty getting around.  He says the symptoms are minimal.  The symptoms were present last time he was seen where he was admitted for COVID with acute hypoxic respiratory failure.  He was prescribed Lasix for fluid management told to follow-up with cardiology.  He has yet to follow-up.  Is overall well with no other concerns or complaints.  His EKG shows sinus rhythm with no acute ischemic change significant interval abnormalities or arrhythmia.  He is overall well-appearing wants to make sure he does not need to be doing anything else.  We will screen him for laboratory abnormalities related to Lasix use.  Chest x-ray was reviewed by radiology myself and shows interstitial disease however improved from previous.  We will check an INR as he is on Coumadin for artificial heart valve.  Patient's laboratory studies appear to be at his baseline.  Hemoglobin 9 per documentation his baseline is 9-10.  His INR is 3.2.  With his artificial valve seems therapeutic he needs follow-up for that routine checks.  He told me he did not know he was close to follow-up afterwards for a checkup.  He is given information to follow-up with his doctors and cardiology.  Strict return precautions discussed vital signs stable time of discharge  Final Clinical Impression(s) / ED Diagnoses Final diagnoses:  Shortness of breath    Rx / DC Orders ED Discharge Orders    None       Sabino Donovan, MD 12/17/20 1041

## 2020-12-18 ENCOUNTER — Encounter (HOSPITAL_COMMUNITY): Payer: Self-pay

## 2020-12-18 ENCOUNTER — Telehealth: Payer: Self-pay | Admitting: Hematology and Oncology

## 2020-12-18 ENCOUNTER — Other Ambulatory Visit: Payer: Self-pay

## 2020-12-18 ENCOUNTER — Inpatient Hospital Stay (HOSPITAL_COMMUNITY)
Admission: EM | Admit: 2020-12-18 | Discharge: 2020-12-21 | DRG: 291 | Disposition: A | Discharge status: Home / Self Care | Payer: 59 | Attending: Internal Medicine | Admitting: Internal Medicine

## 2020-12-18 ENCOUNTER — Emergency Department (HOSPITAL_COMMUNITY): Payer: 59

## 2020-12-18 DIAGNOSIS — Z79899 Other long term (current) drug therapy: Secondary | ICD-10-CM

## 2020-12-18 DIAGNOSIS — J9601 Acute respiratory failure with hypoxia: Secondary | ICD-10-CM | POA: Diagnosis not present

## 2020-12-18 DIAGNOSIS — I10 Essential (primary) hypertension: Secondary | ICD-10-CM

## 2020-12-18 DIAGNOSIS — I160 Hypertensive urgency: Secondary | ICD-10-CM | POA: Diagnosis present

## 2020-12-18 DIAGNOSIS — Z7982 Long term (current) use of aspirin: Secondary | ICD-10-CM

## 2020-12-18 DIAGNOSIS — Z20822 Contact with and (suspected) exposure to covid-19: Secondary | ICD-10-CM | POA: Diagnosis present

## 2020-12-18 DIAGNOSIS — D72829 Elevated white blood cell count, unspecified: Secondary | ICD-10-CM

## 2020-12-18 DIAGNOSIS — I480 Paroxysmal atrial fibrillation: Secondary | ICD-10-CM

## 2020-12-18 DIAGNOSIS — I5033 Acute on chronic diastolic (congestive) heart failure: Secondary | ICD-10-CM

## 2020-12-18 DIAGNOSIS — I5043 Acute on chronic combined systolic (congestive) and diastolic (congestive) heart failure: Secondary | ICD-10-CM | POA: Diagnosis present

## 2020-12-18 DIAGNOSIS — Z954 Presence of other heart-valve replacement: Secondary | ICD-10-CM | POA: Diagnosis not present

## 2020-12-18 DIAGNOSIS — I509 Heart failure, unspecified: Secondary | ICD-10-CM

## 2020-12-18 DIAGNOSIS — I351 Nonrheumatic aortic (valve) insufficiency: Secondary | ICD-10-CM | POA: Diagnosis present

## 2020-12-18 DIAGNOSIS — I110 Hypertensive heart disease with heart failure: Principal | ICD-10-CM | POA: Diagnosis present

## 2020-12-18 DIAGNOSIS — Z7901 Long term (current) use of anticoagulants: Secondary | ICD-10-CM

## 2020-12-18 DIAGNOSIS — Z8616 Personal history of COVID-19: Secondary | ICD-10-CM

## 2020-12-18 DIAGNOSIS — Z8249 Family history of ischemic heart disease and other diseases of the circulatory system: Secondary | ICD-10-CM

## 2020-12-18 DIAGNOSIS — Z823 Family history of stroke: Secondary | ICD-10-CM

## 2020-12-18 DIAGNOSIS — Z87891 Personal history of nicotine dependence: Secondary | ICD-10-CM

## 2020-12-18 DIAGNOSIS — Z952 Presence of prosthetic heart valve: Secondary | ICD-10-CM

## 2020-12-18 LAB — COMPREHENSIVE METABOLIC PANEL
ALT: 14 U/L (ref 0–44)
AST: 36 U/L (ref 15–41)
Albumin: 3.5 g/dL (ref 3.5–5.0)
Alkaline Phosphatase: 72 U/L (ref 38–126)
Anion gap: 10 (ref 5–15)
BUN: 15 mg/dL (ref 6–20)
CO2: 23 mmol/L (ref 22–32)
Calcium: 9.2 mg/dL (ref 8.9–10.3)
Chloride: 104 mmol/L (ref 98–111)
Creatinine, Ser: 1.06 mg/dL (ref 0.61–1.24)
GFR, Estimated: >60 mL/min (ref >60)
Glucose, Bld: 150 mg/dL — ABNORMAL HIGH (ref 70–99)
Potassium: 3.7 mmol/L (ref 3.5–5.1)
Sodium: 137 mmol/L (ref 135–145)
Total Bilirubin: 1 mg/dL (ref 0.3–1.2)
Total Protein: 7.9 g/dL (ref 6.5–8.1)

## 2020-12-18 LAB — CBC WITH DIFFERENTIAL/PLATELET
Abs Immature Granulocytes: 0.15 10*3/uL — ABNORMAL HIGH (ref 0.00–0.07)
Basophils Absolute: 0.1 10*3/uL (ref 0.0–0.1)
Basophils Relative: 0 %
Eosinophils Absolute: 0.3 10*3/uL (ref 0.0–0.5)
Eosinophils Relative: 2 %
HCT: 32.2 % — ABNORMAL LOW (ref 39.0–52.0)
Hemoglobin: 9.8 g/dL — ABNORMAL LOW (ref 13.0–17.0)
Immature Granulocytes: 1 %
Lymphocytes Relative: 8 %
Lymphs Abs: 1.4 10*3/uL (ref 0.7–4.0)
MCH: 27.4 pg (ref 26.0–34.0)
MCHC: 30.4 g/dL (ref 30.0–36.0)
MCV: 89.9 fL (ref 80.0–100.0)
Monocytes Absolute: 1.5 10*3/uL — ABNORMAL HIGH (ref 0.1–1.0)
Monocytes Relative: 8 %
Neutro Abs: 14.5 10*3/uL — ABNORMAL HIGH (ref 1.7–7.7)
Neutrophils Relative %: 81 %
Platelets: 321 10*3/uL (ref 150–400)
RBC: 3.58 MIL/uL — ABNORMAL LOW (ref 4.22–5.81)
RDW: 18 % — ABNORMAL HIGH (ref 11.5–15.5)
WBC: 17.9 10*3/uL — ABNORMAL HIGH (ref 4.0–10.5)
nRBC: 0 % (ref 0.0–0.2)

## 2020-12-18 LAB — SURGICAL PATHOLOGY

## 2020-12-18 LAB — PROTIME-INR
INR: 3.5 — ABNORMAL HIGH (ref 0.8–1.2)
Prothrombin Time: 34 seconds — ABNORMAL HIGH (ref 11.4–15.2)

## 2020-12-18 LAB — TROPONIN I (HIGH SENSITIVITY)
Troponin I (High Sensitivity): 14 ng/L (ref <18)
Troponin I (High Sensitivity): 15 ng/L (ref <18)

## 2020-12-18 LAB — RESP PANEL BY RT-PCR (FLU A&B, COVID) ARPGX2
Influenza A by PCR: NEGATIVE
Influenza B by PCR: NEGATIVE
SARS Coronavirus 2 by RT PCR: NEGATIVE

## 2020-12-18 LAB — MAGNESIUM: Magnesium: 1.9 mg/dL (ref 1.7–2.4)

## 2020-12-18 LAB — BRAIN NATRIURETIC PEPTIDE: B Natriuretic Peptide: 128.3 pg/mL — ABNORMAL HIGH (ref 0.0–100.0)

## 2020-12-18 LAB — MRSA PCR SCREENING: MRSA by PCR: NEGATIVE

## 2020-12-18 MED ORDER — WARFARIN SODIUM 5 MG PO TABS
5.0000 mg | ORAL_TABLET | Freq: Once | ORAL | Status: AC
  Administered 2020-12-18: 5 mg via ORAL
  Filled 2020-12-18: qty 1

## 2020-12-18 MED ORDER — ORAL CARE MOUTH RINSE
15.0000 mL | Freq: Two times a day (BID) | OROMUCOSAL | Status: DC
  Administered 2020-12-18 – 2020-12-20 (×5): 15 mL via OROMUCOSAL

## 2020-12-18 MED ORDER — ACETAMINOPHEN 325 MG PO TABS: 650.0000 mg | ORAL_TABLET | Freq: Four times a day (QID) | ORAL | Status: DC | PRN

## 2020-12-18 MED ORDER — POLYETHYLENE GLYCOL 3350 17 G PO PACK: 17.0000 g | PACK | Freq: Every day | ORAL | Status: DC | PRN

## 2020-12-18 MED ORDER — CHLORHEXIDINE GLUCONATE CLOTH 2 % EX PADS
6.0000 | MEDICATED_PAD | Freq: Every day | CUTANEOUS | Status: DC
  Administered 2020-12-18 – 2020-12-20 (×3): 6 via TOPICAL

## 2020-12-18 MED ORDER — CHLORHEXIDINE GLUCONATE 0.12 % MT SOLN
15.0000 mL | Freq: Two times a day (BID) | OROMUCOSAL | Status: DC
  Administered 2020-12-18 – 2020-12-21 (×7): 15 mL via OROMUCOSAL
  Filled 2020-12-18 (×7): qty 15

## 2020-12-18 MED ORDER — NEBIVOLOL HCL 5 MG PO TABS
5.0000 mg | ORAL_TABLET | Freq: Every day | ORAL | Status: DC
  Administered 2020-12-18 – 2020-12-21 (×4): 5 mg via ORAL
  Filled 2020-12-18 (×4): qty 1

## 2020-12-18 MED ORDER — SODIUM CHLORIDE 0.9% FLUSH
3.0000 mL | Freq: Two times a day (BID) | INTRAVENOUS | Status: DC
  Administered 2020-12-18 – 2020-12-21 (×7): 3 mL via INTRAVENOUS

## 2020-12-18 MED ORDER — NITROGLYCERIN IN D5W 200-5 MCG/ML-% IV SOLN
0.0000 ug/min | INTRAVENOUS | Status: DC
  Administered 2020-12-18: 5 ug/min via INTRAVENOUS
  Filled 2020-12-18: qty 250

## 2020-12-18 MED ORDER — ASPIRIN EC 81 MG PO TBEC
81.0000 mg | DELAYED_RELEASE_TABLET | Freq: Every day | ORAL | Status: DC
  Administered 2020-12-18 – 2020-12-21 (×4): 81 mg via ORAL
  Filled 2020-12-18 (×4): qty 1

## 2020-12-18 MED ORDER — WARFARIN - PHARMACIST DOSING INPATIENT: Freq: Every day | Status: DC

## 2020-12-18 MED ORDER — FUROSEMIDE 10 MG/ML IJ SOLN
40.0000 mg | Freq: Two times a day (BID) | INTRAMUSCULAR | Status: DC
  Administered 2020-12-18 – 2020-12-20 (×5): 40 mg via INTRAVENOUS
  Filled 2020-12-18 (×5): qty 4

## 2020-12-18 MED ORDER — ASPIRIN 81 MG PO CHEW
324.0000 mg | CHEWABLE_TABLET | Freq: Once | ORAL | Status: AC
  Administered 2020-12-18: 324 mg via ORAL
  Filled 2020-12-18: qty 4

## 2020-12-18 MED ORDER — FUROSEMIDE 10 MG/ML IJ SOLN
40.0000 mg | Freq: Once | INTRAMUSCULAR | Status: AC
  Administered 2020-12-18: 40 mg via INTRAVENOUS
  Filled 2020-12-18: qty 4

## 2020-12-18 MED ORDER — ACETAMINOPHEN 650 MG RE SUPP: 650.0000 mg | Freq: Four times a day (QID) | RECTAL | Status: DC | PRN

## 2020-12-18 NOTE — Progress Notes (Signed)
ANTICOAGULATION CONSULT NOTE - Initial Consult  Pharmacy Consult for warfarin Indication: Mechanical mitral valve, afib  No Known Allergies  Patient Measurements:   Heparin Dosing Weight: n/a  Vital Signs: Temp: 99.5 F (37.5 C) (03/11 0504) Temp Source: Oral (03/11 0504) BP: 115/75 (03/11 0727) Pulse Rate: 96 (03/11 0700)  Labs: Recent Labs    12/17/20 0915 12/18/20 0525 12/18/20 0645  HGB 9.0* 9.8*  --   HCT 29.3* 32.2*  --   PLT 271 321  --   LABPROT 31.6* 34.0*  --   INR 3.2* 3.5*  --   CREATININE 0.84 1.06  --   TROPONINIHS  --  14 15    Estimated Creatinine Clearance: 77.8 mL/min (by C-G formula based on SCr of 1.06 mg/dL).   Medical History: Past Medical History:  Diagnosis Date  . Hypertension   . Incidental pulmonary nodule 01/08/2020   Multiple in right lung - need f/u CT 1 year due to h/o smoking  . Mild aortic stenosis   . Mitral valve stenosis, severe   . Moderate mitral regurgitation   . Pneumonia    x 1  . Rheumatic mitral stenosis with regurgitation    Echo 08/08/16:  Severe mitral stenosis (mean gradient 17 mmHg).  Moderate mitral regurgitation.  . 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 Wears glasses   . Wears partial dentures    upper    Medications: Warfarin chronically PTA Home dose: warfarin 5 mg TTS, 7.5 mg all other days of the week. (See anticoag clinic notes from 2/23) Last dose: 3/10  Assessment: Pt is a 54 yoM chronically on warfarin for history of mechanical mitral valve as well as atrial fibrillation. Pt admitted with SOB likely 2/2 HF exacerbation. Pharmacy consulted to manage warfarin inpatient.   Today, 12/18/20  INR = 3.5 is therapeutic on upper end of therapeutic range  CBC: Hgb (9.8) low but stable; Plt WNL  Albumin WNL  Diet: Sodium restricted; meal intake not  charted  DDI: No significant drug interactions. HF exacerbation may enhance effects of warfarin  Goal of Therapy:  INR 2.5 - 3.5 Monitor platelets by anticoagulation protocol: Yes   Plan:   Warfarin 5 mg PO once (slightly reduced dose for INR upper end of therapeutic range and HF exacerbation)  INR daily  CBC with AM labs tomorrow    Cindi Carbon, PharmD 12/18/2020,10:30 AM

## 2020-12-18 NOTE — Progress Notes (Signed)
Patient appears much more comfortable in bed on supplemental nasal O2. VSS. NAD at this time. Shortness of breath continues but he is able to speak clearly in sentences and conveys that he already feels better since being placed in a room. Spoke with EDP who agrees to hold BiPAP for now. BiPAP remains covered, at bedside per MD request. RN aware.

## 2020-12-18 NOTE — H&P (Addendum)
History and Physical        Hospital Admission Note Date: 12/18/2020  Patient name: Melvin Burns Medical record number: [id number] Date of birth: 11/02/1965 Age: 55 y.o. Gender: male  PCP: Chilton Si, MD   Chief Complaint    Chief Complaint  Patient presents with  . Shortness of Breath      HPI:   This is a 55 year old male who has been vaccinated against COVID-19 x3 with history of hypertension, rheumatic heart disease s/p mechanical valve, paroxysmal atrial fibrillation on Coumadin and aspirin, tobacco abuse, moderate mitral regurgitation, mild AS, recent hospitalization from 2/25-2/28 for acute hypoxic respiratory failure secondary to HFpEF exacerbation and also COVID 19 positive at the time who presented to the ED with shortness of breath. Patient was initially seen in the ED on 3/10 but was deemed stable for discharge and advised to follow up with his cardiologist. However, he returned to the ED on 3/11 AM with increased SOB with SpO2 84% on RA and RR 48 in triage.   Currently, the patient is feeling better but still requiring O2. States he has been unable to follow up with his cardiologist since his last discharge. He states he is compliant with his medications and take his lasix during the day, however his symptoms occur typically at night. Denies any chest pain, palpitations, fever, leg swelling or any other complaints.   ED Course: Afebrile, tachycardic, tachypneic,  hypertensive, hypoxic (SpO2 84% on room air) placed on 6 L/min. Notable Labs: Glucose 150 otherwise CMP unremarkable, BNP 128, Troponin 14->15, WBC 18, Hb 9.8, platelets 321, INR 3.5. Notable Imaging: CXR - mild atypical/viral infection or interstitial edema unchanged. Patient received Lasix 40 mg IV x 1, Aspirin 324 mg, nitro drip.    Vitals:   12/18/20 0724 12/18/20 0727  BP: 117/72 115/75   Pulse:    Resp: 19 18  Temp:    SpO2:       Review of Systems:  Review of Systems  All other systems reviewed and are negative.   Medical/Social/Family History   Past Medical History: Past Medical History:  Diagnosis Date  . Hypertension   . Incidental pulmonary nodule 01/08/2020   Multiple in right lung - need f/u CT 1 year due to h/o smoking  . Mild aortic stenosis   . Mitral valve stenosis, severe   . Moderate mitral regurgitation   . Pneumonia    x 1  . Rheumatic mitral stenosis with regurgitation    Echo 08/08/16:  Severe mitral stenosis (mean gradient 17 mmHg).  Moderate mitral regurgitation.  . 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 Wears glasses   . Wears partial dentures    upper    Past Surgical History:  Procedure Laterality Date  . BILATERAL KNEE ARTHROSCOPY    . BUBBLE STUDY  12/16/2019   Procedure: BUBBLE STUDY;  Surgeon: [name], MD;  Location: [facility] ENDOSCOPY;  Service: Cardiology;;  . MITRAL VALVE REPLACEMENT Right 03/06/2020   Procedure: MINIMALLY INVASIVE MITRAL VALVE (MV) REPLACEMENT using Carbomedics Optiform 33 MM Mitral Bioprosthetic Valve.;  Surgeon: Purcell Nails,  MD;  Location: [facility] OR;  Service: Open Heart Surgery;  Laterality: Right;  . MULTIPLE EXTRACTIONS WITH ALVEOLOPLASTY N/A 12/31/2019   Procedure: Extraction of tooth #'s 16-17, 20-23, and 28-30 with alveoloplasty, Bilateral mandibular tori reductions, and lateral exostoses reductions.;  Surgeon: Charlynne PanderKulinski, Ronald F, DDS;  Location: [facility] OR;  Service: Oral Surgery;  Laterality: N/A;  . RIGHT/LEFT HEART CATH AND CORONARY ANGIOGRAPHY N/A 12/16/2019   Procedure: RIGHT/LEFT HEART CATH AND CORONARY ANGIOGRAPHY;  Surgeon: Yvonne KendallEnd, Christopher, MD;  Location: [facility] INVASIVE CV LAB;  Service: Cardiovascular;  Laterality: N/A;  . TEE WITHOUT CARDIOVERSION N/A  12/16/2019   Procedure: TRANSESOPHAGEAL ECHOCARDIOGRAM (TEE);  Surgeon: Parke PoissonAcharya, Gayatri A, MD;  [facility];  Service: Cardiology;  Laterality: N/A;  . TEE WITHOUT CARDIOVERSION N/A 03/06/2020   Procedure: TRANSESOPHAGEAL ECHOCARDIOGRAM (TEE);  Surgeon: Purcell Nailswen, Clarence H, MD;  [facility];  Service: Open Heart Surgery;  Laterality: N/A;    Medications: Prior to Admission medications   Medication Sig Start Date End Date Taking? Authorizing Provider  amLODipine (NORVASC) 10 MG tablet Take 1 tablet (10 mg total) by mouth daily. 07/01/20 09/29/20 Yes Chilton Siandolph, Tiffany, MD  aspirin EC 81 MG tablet Take 1 tablet (81 mg total) by mouth daily. 12/16/19  Yes End, Cristal Deerhristopher, MD  furosemide (LASIX) 20 MG tablet Take 2 tablets (40 mg total) by mouth daily. 12/07/20 01/06/21 Yes Briant CedarEzenduka, Nkeiruka J, MD  lisinopril (ZESTRIL) 10 MG tablet Take 1 tablet (10 mg total) by mouth daily - in the evenings 12/07/20  Yes Briant CedarEzenduka, Nkeiruka J, MD  warfarin (COUMADIN) 5 MG tablet Take 1 tablet (5 mg total) by mouth daily at 4 PM. Patient taking differently: Take 5-7.5 mg by mouth See admin instructions. Take 1 tablet by mouth on Tues, Thurs, and Sat. All other days take 1 & 1/2 tablet. 07/28/20  Yes Chilton Siandolph, Tiffany, MD  Evolocumab (REPATHA SURECLICK) 140 MG/ML SOAJ Inject 140 mg into the skin every 14 (fourteen) days. Patient not taking: No sig reported 10/08/20   Chilton Siandolph, Tiffany, MD  nebivolol (BYSTOLIC) 5 MG tablet Take 1 tablet (5 mg total) by mouth daily. Patient not taking: Reported on 12/18/2020 07/01/20   Chilton Siandolph, Tiffany, MD    Allergies:  No Known Allergies  Social History:  reports that he quit smoking about a year ago. His smoking use included cigarettes. He has a 35.00 pack-year smoking history. He has never used smokeless tobacco. He reports that he does not drink alcohol and does not use drugs.  Family History: Family History  Problem Relation Age of Onset  . Hypertension Mother   .  Hypertension Father   . Stroke Father      Objective   Physical Exam: Blood pressure 115/75, pulse 96, temperature 99.5 F (37.5 C), temperature source Oral, resp. rate 18, SpO2 95 %.  Physical Exam Vitals and nursing note reviewed.  Constitutional:      Appearance: Normal appearance.  HENT:     Head: Normocephalic and atraumatic.  Eyes:     Conjunctiva/sclera: Conjunctivae normal.  Cardiovascular:     Rate and Rhythm: Normal rate and regular rhythm.     Heart sounds: Murmur heard.    Pulmonary:     Effort: Pulmonary effort is normal.     Breath sounds: Normal breath sounds.  Abdominal:     General: Abdomen is flat.     Palpations: Abdomen is soft.  Musculoskeletal:        General: No swelling or tenderness.  Skin:    Coloration:  Skin is not jaundiced or pale.  Neurological:     Mental Status: He is alert. Mental status is at baseline.  Psychiatric:        Mood and Affect: Mood normal.        Behavior: Behavior normal.     LABS on Admission: I have personally reviewed all the labs and imaging below    Basic Metabolic Panel: Recent Labs  Lab 12/17/20 0915 12/18/20 0525  NA 137 137  K 3.9 3.7  CL 104 104  CO2 25 23  GLUCOSE 109* 150*  BUN 12 15  CREATININE 0.84 1.06  CALCIUM 8.9 9.2   Liver Function Tests: Recent Labs  Lab 12/18/20 0525  AST 36  ALT 14  ALKPHOS 72  BILITOT 1.0  PROT 7.9  ALBUMIN 3.5   No results for input(s): LIPASE, AMYLASE in the last 168 hours. No results for input(s): AMMONIA in the last 168 hours. CBC: Recent Labs  Lab 12/17/20 0915 12/18/20 0525  WBC 17.5* 17.9*  NEUTROABS 13.1* 14.5*  HGB 9.0* 9.8*  HCT 29.3* 32.2*  MCV 89.6 89.9  PLT 271 321   Cardiac Enzymes: No results for input(s): CKTOTAL, CKMB, CKMBINDEX, TROPONINI in the last 168 hours. BNP: Invalid input(s): POCBNP CBG: No results for input(s): GLUCAP in the last 168 hours.  Radiological Exams on Admission:  DG Chest 2 View  Result Date:  12/17/2020 CLINICAL DATA:  55 year old male with COVID-19 diagnosed 13 days ago. Shortness of breath. EXAM: CHEST - 2 VIEW COMPARISON:  Portable chest 12/07/2020 and earlier. FINDINGS: PA and lateral views. Stable lung volumes and mediastinal contours. Prior cardiac valve replacement. Visualized tracheal air column is within normal limits. No pneumothorax or layering pleural effusion. Trace fluid in the pulmonary fissures is decreased compared to last month. Perihilar and diffuse coarse pulmonary interstitial opacity has recurred and closely resembles that on 12/04/2020. These changes are new since 2021. No consolidation. No osseous abnormality identified.  Negative visible bowel gas. IMPRESSION: Recurrent perihilar and widespread coarse pulmonary interstitial opacity, closely resembling that on 12/04/2020, which was improved on 12/07/2020. Main differential considerations are Recurrent Pulmonary Edema, progressive sequelae of COVID-19. Electronically Signed   By: Odessa Fleming M.D.   On: 12/17/2020 07:18   DG Chest Portable 1 View  Result Date: 12/18/2020 CLINICAL DATA:  Shortness of breath EXAM: PORTABLE CHEST 1 VIEW COMPARISON:  12/17/2020 FINDINGS: Increased interstitial markings with mild patchy opacities in the lungs bilaterally, favoring with mild atypical/viral infection or mild interstitial edema, grossly unchanged. No definite pleural effusions.  No pneumothorax. Heart is normal in size.  Prosthetic valve. IMPRESSION: Mild atypical/viral infection or interstitial edema, grossly unchanged. No definite pleural effusions. Electronically Signed   By: Charline Bills M.D.   On: 12/18/2020 06:14      EKG: Sinus rhythm, prolonged PR interval, ST depressions in V4-6   A & P   Principal Problem:   Acute respiratory failure with hypoxia (HCC) Active Problems:   Essential hypertension   S/P minimally-invasive mitral valve replacement with mechanical valve   PAF (paroxysmal atrial fibrillation) (HCC)    CHF exacerbation (HCC)   Leukocytosis   1. Acute hypoxic respiratory failure, likely secondary to diastolic heart failure exacerbation a. Symptoms similar to prior hospitalization with improved respiratory status after lasix today b. 12/04/20 EF 60-65% c. BNP minimally elevated d. Continue Lasix 40 mg IV BID e. Continue home nebivolol f. Monitor daily weights and intake/output g. Low sodium diet  h. Discussed with Cardiology NP  who will get the patient set up for an appointment in the office next week  2. ST depressions in V4-6 a. Troponin flat x 2 b. Chest pain free and tele appears improved c. Repeat EKG d. Discontinue nitro drip that was started in the ED e. Continue beta blocker  3. Hypertension a. Continue nebivolol b. Hold lisinopril for now c. Discontinue nitro drip  4. Chronic leukocytosis a. Was on steroids recently for COVID but this appears to be going on for years b. Will discuss with hematology/oncology i. Addendum: discussed with Dr. Pamelia Hoit who recommended Flow Cytometry to evaluate for CLL and will set patient up for an appointment next week.  5. Rheumatic heart disease s/p mechanical valve  paroxysmal atrial fibrillation  a. Continue Coumadin (per pharmacy) and aspirin  6. Recent COVID 19 infection a. Completed remdesivir x 3 days and steroids x 7 days    DVT prophylaxis: coumadin   Code Status: Full Code  Diet: low salt Family Communication: Admission, patients condition and plan of care including tests being ordered have been discussed with the patient who indicates understanding and agrees with the plan and Code Status.  Disposition Plan: The appropriate patient status for this patient is OBSERVATION. Observation status is judged to be reasonable and necessary in order to provide the required intensity of service to ensure the patient's safety. The patient's presenting symptoms, physical exam findings, and initial radiographic and laboratory data in the  context of their medical condition is felt to place them at decreased risk for further clinical deterioration. Furthermore, it is anticipated that the patient will be medically stable for discharge from the hospital within 2 midnights of admission. The following factors support the patient status of observation.   " The patient's presenting symptoms include shortness of breath. " The physical exam findings include hypoxia. " The initial radiographic and laboratory data are elevated BNP.    Status is: Observation  The patient remains OBS appropriate and will d/c before 2 midnights.  Dispo: The patient is from: Home              Anticipated d/c is to: Home              Patient currently is not medically stable to d/c.   Difficult to place patient No        The medical decision making on this patient was of high complexity and the patient is at high risk for clinical deterioration, therefore this is a level 3 admission.  Consultants  . Discussed with cardio . Will discuss with oncology  Procedures  . none  Time Spent on Admission: 70 minutes    Jae Dire, DO [facility]  12/18/2020, 9:42 AM

## 2020-12-18 NOTE — Progress Notes (Addendum)
EKG computer readout: A flutter with 4:1 AV conduction though on personal read this does not look like Atrial flutter and more like sinus rhythm with split P wave (P mitrale?). Discussed with Dr. Anne Fu, cardiology, who agrees this is not A flutter.

## 2020-12-18 NOTE — Telephone Encounter (Signed)
Received a staff msg to schedule a new hem appt w/dr. Pamelia Hoit. Pt has been scheduled to see Dr. Pamelia Hoit on 3/21 at 1pm.

## 2020-12-18 NOTE — ED Provider Notes (Signed)
[facility]-EMERGENCY DEPT Provider Note   CSN: [id number] Arrival date & time: 12/18/20  0449     History Chief Complaint  Patient presents with  . Shortness of Breath    Melvin Burns is a 55 y.o. male.  Level 5 caveat for respiratory distress patient vaccinated x 3 against COVID 19 with a history of hypertension, rheumatic heart disease s/p mechanical mitral valve, paroxysmal atrial fibrillation on coumadin and aspirin, tobacco abuse, who presented to the ED with complaints of SOB. He was here yesterday for similar complaint but feels much worse today.  States he woke up short of breath this morning gasping for breath and unable to catch his breath.  He was found to be breathing 48 times a minute on arrival saturating 84% on room air with a heart rate of 150.  Was Covid + February 28.  He denies any chest pain, cough or fever. No abdominal pain.  No leg pain or leg swelling. States compliance with his medications including his Coumadin and Lasix.  He denies any chest pain currently.  No cough or fever.  The history is provided by the patient.  Shortness of Breath Associated symptoms: no abdominal pain, no chest pain, no cough, no fever, no headaches, no neck pain and no vomiting        Past Medical History:  Diagnosis Date  . Hypertension   . Incidental pulmonary nodule 01/08/2020   Multiple in right lung - need f/u CT 1 year due to h/o smoking  . Mild aortic stenosis   . Mitral valve stenosis, severe   . Moderate mitral regurgitation   . Pneumonia    x 1  . Rheumatic mitral stenosis with regurgitation    Echo 08/08/16:  Severe mitral stenosis (mean gradient 17 mmHg).  Moderate mitral regurgitation.  . 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 Wears glasses   . Wears partial dentures    upper    Patient  Active Problem List   Diagnosis Date Noted  . Acute respiratory failure with hypoxia (HCC) 12/04/2020  . COVID-19 virus infection 12/04/2020  . PAF (paroxysmal atrial fibrillation) (HCC) 03/30/2020  . Bacteremia due to Enterobacter species 03/13/2020  . IV site infection (HCC) 03/13/2020  . 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 Incidental pulmonary nodule 01/08/2020  . Pre-operative clearance 06/11/2019  . Mitral valve stenosis, severe 08/15/2017  . Moderate mitral regurgitation 08/15/2017  . Abdominal pain, epigastric 01/02/2017  . Non-intractable vomiting with nausea 01/02/2017  . 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 Family history of stroke 03/11/2013  . Essential hypertension 03/11/2013  . Smoker 03/11/2013  . Chronic periodontitis, unspecified 03/11/2013    Past Surgical History:  Procedure Laterality Date  . BILATERAL KNEE ARTHROSCOPY    . BUBBLE STUDY  12/16/2019   Procedure: BUBBLE STUDY;  Surgeon: [name], MD;  Location: [facility] ENDOSCOPY;  Service: Cardiology;;  . MITRAL VALVE REPLACEMENT Right 03/06/2020   Procedure: MINIMALLY INVASIVE MITRAL VALVE (MV) REPLACEMENT using Carbomedics Optiform 33 MM Mitral Bioprosthetic Valve.;  Surgeon: Purcell Nails, MD;  Location: [facility] OR;  Service: Open Heart Surgery;  Laterality: Right;  . MULTIPLE EXTRACTIONS WITH ALVEOLOPLASTY N/A 12/31/2019   Procedure: Extraction of tooth #'s 16-17, 20-23, and 28-30 with alveoloplasty, Bilateral mandibular tori reductions, and lateral exostoses reductions.;  Surgeon: Charlynne Pander, DDS;  Location: [facility] OR;  Service: Oral Surgery;  Laterality: N/A;  . RIGHT/LEFT HEART CATH AND CORONARY ANGIOGRAPHY N/A 12/16/2019   Procedure: RIGHT/LEFT HEART CATH AND CORONARY ANGIOGRAPHY;  Surgeon: Yvonne KendallEnd, Christopher, MD;  Location: [facility] INVASIVE CV LAB;  Service: Cardiovascular;  Laterality: N/A;  . TEE WITHOUT CARDIOVERSION N/A 12/16/2019    Procedure: TRANSESOPHAGEAL ECHOCARDIOGRAM (TEE);  Surgeon: Parke PoissonAcharya, Gayatri A, MD;  [facility];  Service: Cardiology;  Laterality: N/A;  . TEE WITHOUT CARDIOVERSION N/A 03/06/2020   Procedure: TRANSESOPHAGEAL ECHOCARDIOGRAM (TEE);  Surgeon: Purcell Nailswen, Clarence H, MD;  [facility];  Service: Open Heart Surgery;  Laterality: N/A;       Family History  Problem Relation Age of Onset  . Hypertension Mother   . Hypertension Father   . Stroke Father     Social History   Tobacco Use  . Smoking status: Former Smoker    Packs/day: 1.00    Years: 35.00    Pack years: 35.00    Types: Cigarettes    Quit date: 12/09/2019    Years since quitting: 1.0  . Smokeless tobacco: Never Used  Vaping Use  . Vaping Use: Never used  Substance Use Topics  . Alcohol use: No  . Drug use: No    Home Medications Prior to Admission medications   Medication Sig Start Date End Date Taking? Authorizing Provider  amLODipine (NORVASC) 10 MG tablet Take 1 tablet (10 mg total) by mouth daily. 07/01/20 09/29/20  Chilton Siandolph, Tiffany, MD  aspirin EC 81 MG tablet Take 1 tablet (81 mg total) by mouth daily. 12/16/19   End, Cristal Deerhristopher, MD  Evolocumab (REPATHA SURECLICK) 140 MG/ML SOAJ Inject 140 mg into the skin every 14 (fourteen) days. Patient not taking: No sig reported 10/08/20   Chilton Siandolph, Tiffany, MD  furosemide (LASIX) 20 MG tablet Take 2 tablets (40 mg total) by mouth daily. 12/07/20 01/06/21  Briant CedarEzenduka, Nkeiruka J, MD  lisinopril (ZESTRIL) 10 MG tablet Take 1 tablet (10 mg total) by mouth daily - in the evenings 12/07/20   Briant CedarEzenduka, Nkeiruka J, MD  nebivolol (BYSTOLIC) 5 MG tablet Take 1 tablet (5 mg total) by mouth daily. 07/01/20   Chilton Siandolph, Tiffany, MD  warfarin (COUMADIN) 5 MG tablet Take 1 tablet (5 mg total) by mouth daily at 4 PM. Patient taking differently: Take 5-7.5 mg by mouth See admin instructions. Take 1 tablet by mouth on Tues, Thurs, and Sat. All other days take 1 & 1/2 tablet. 07/28/20    Chilton Siandolph, Tiffany, MD    Allergies    Patient has no known allergies.  Review of Systems   Review of Systems  Constitutional: Negative for activity change, appetite change and fever.  HENT: Negative.  Negative for congestion and rhinorrhea.   Eyes: Negative for visual disturbance.  Respiratory: Positive for shortness of breath. Negative for cough and chest tightness.   Cardiovascular: Positive for leg swelling. Negative for chest pain.  Gastrointestinal: Negative for abdominal pain, nausea and vomiting.  Genitourinary: Negative for dysuria and hematuria.  Musculoskeletal: Negative for arthralgias, joint swelling and neck pain.  Neurological: Negative for dizziness, weakness and headaches.   all other systems are negative except as noted in the HPI and PMH.    Physical Exam Updated Vital Signs BP (!) 157/108 (BP Location: Right Arm)   Pulse (!) 119   Temp 99.5 F (37.5 C) (Oral)   Resp (!) 25   SpO2 90%   Physical Exam Vitals and nursing note reviewed.  Constitutional:  General: He is in acute distress.     Appearance: He is well-developed. He is ill-appearing.     Comments: Gasping for breath, short phrases  HENT:     Head: Normocephalic and atraumatic.     Mouth/Throat:     Pharynx: No oropharyngeal exudate.  Eyes:     Conjunctiva/sclera: Conjunctivae normal.     Pupils: Pupils are equal, round, and reactive to light.  Neck:     Comments: No meningismus. Cardiovascular:     Rate and Rhythm: Regular rhythm. Tachycardia present.     Heart sounds: Normal heart sounds. No murmur heard.   Pulmonary:     Effort: Respiratory distress present.     Breath sounds: Rales present.     Comments:  Crackles bilateral Tachypnea to the 40s Abdominal:     Palpations: Abdomen is soft.     Tenderness: There is no abdominal tenderness. There is no guarding or rebound.  Musculoskeletal:        General: No tenderness. Normal range of motion.     Cervical back: Normal range  of motion and neck supple.     Right lower leg: No edema.     Left lower leg: No edema.  Skin:    General: Skin is warm.     Capillary Refill: Capillary refill takes less than 2 seconds.  Neurological:     General: No focal deficit present.     Mental Status: He is alert and oriented to person, place, and time. Mental status is at baseline.     Cranial Nerves: No cranial nerve deficit.     Motor: No abnormal muscle tone.     Coordination: Coordination normal.     Comments:  5/5 strength throughout. CN 2-12 intact.Equal grip strength.   Psychiatric:        Behavior: Behavior normal.     ED Results / Procedures / Treatments   Labs (all labs ordered are listed, but only abnormal results are displayed) Labs Reviewed  CBC WITH DIFFERENTIAL/PLATELET - Abnormal; Notable for the following components:      Result Value   WBC 17.9 (*)    RBC 3.58 (*)    Hemoglobin 9.8 (*)    HCT 32.2 (*)    RDW 18.0 (*)    Neutro Abs 14.5 (*)    Monocytes Absolute 1.5 (*)    Abs Immature Granulocytes 0.15 (*)    All other components within normal limits  COMPREHENSIVE METABOLIC PANEL - Abnormal; Notable for the following components:   Glucose, Bld 150 (*)    All other components within normal limits  BRAIN NATRIURETIC PEPTIDE - Abnormal; Notable for the following components:   B Natriuretic Peptide 128.3 (*)    All other components within normal limits  PROTIME-INR - Abnormal; Notable for the following components:   Prothrombin Time 34.0 (*)    INR 3.5 (*)    All other components within normal limits  RESP PANEL BY RT-PCR (FLU A&B, COVID) ARPGX2  TROPONIN I (HIGH SENSITIVITY)    EKG EKG Interpretation  Date/Time:  Friday December 18 2020 05:07:28 EST Ventricular Rate:  115 PR Interval:    QRS Duration: 83 QT Interval:  320 QTC Calculation: 443 R Axis:   55 Text Interpretation: Sinus tachycardia LVH with secondary repolarization abnormality lateral ST depressions Confirmed by Glynn Octave [phone]) on 12/18/2020 6:04:27 AM   Radiology DG Chest 2 View  Result Date: 12/17/2020 CLINICAL DATA:  55 year old male with COVID-19 diagnosed 13 days ago.  Shortness of breath. EXAM: CHEST - 2 VIEW COMPARISON:  Portable chest 12/07/2020 and earlier. FINDINGS: PA and lateral views. Stable lung volumes and mediastinal contours. Prior cardiac valve replacement. Visualized tracheal air column is within normal limits. No pneumothorax or layering pleural effusion. Trace fluid in the pulmonary fissures is decreased compared to last month. Perihilar and diffuse coarse pulmonary interstitial opacity has recurred and closely resembles that on 12/04/2020. These changes are new since 2021. No consolidation. No osseous abnormality identified.  Negative visible bowel gas. IMPRESSION: Recurrent perihilar and widespread coarse pulmonary interstitial opacity, closely resembling that on 12/04/2020, which was improved on 12/07/2020. Main differential considerations are Recurrent Pulmonary Edema, progressive sequelae of COVID-19. Electronically Signed   By: Odessa Fleming M.D.   On: 12/17/2020 07:18   DG Chest Portable 1 View  Result Date: 12/18/2020 CLINICAL DATA:  Shortness of breath EXAM: PORTABLE CHEST 1 VIEW COMPARISON:  12/17/2020 FINDINGS: Increased interstitial markings with mild patchy opacities in the lungs bilaterally, favoring with mild atypical/viral infection or mild interstitial edema, grossly unchanged. No definite pleural effusions.  No pneumothorax. Heart is normal in size.  Prosthetic valve. IMPRESSION: Mild atypical/viral infection or interstitial edema, grossly unchanged. No definite pleural effusions. Electronically Signed   By: Charline Bills M.D.   On: 12/18/2020 06:14    Procedures .Critical Care Performed by: Glynn Octave, MD Authorized by: Glynn Octave, MD   Critical care provider statement:    Critical care time (minutes):  45   Critical care was necessary to treat or prevent  imminent or life-threatening deterioration of the following conditions:  Respiratory failure and cardiac failure   Critical care was time spent personally by me on the following activities:  Discussions with consultants, evaluation of patient's response to treatment, examination of patient, ordering and performing treatments and interventions, ordering and review of laboratory studies, ordering and review of radiographic studies, pulse oximetry, re-evaluation of patient's condition, obtaining history from patient or surrogate and review of old charts     Medications Ordered in ED Medications  furosemide (LASIX) injection 40 mg (has no administration in time range)    ED Course  I have reviewed the triage vital signs and the nursing notes.  Pertinent labs & imaging results that were available during my care of the patient were reviewed by me and considered in my medical decision making (see chart for details).    MDM Rules/Calculators/A&P                         Respiratory distress with hypoxia, tachycardia, tachypnea.  Concern for CHF exacerbation.  Patient given IV Lasix and placed on BiPAP.  EKG shows new ST depressions laterally.  Denies chest pain.  EKG does show new ST depressions laterally.  Troponin is negative.  Chest x-ray shows stable interstitial edema. May be post Covid related.  Patient given IV Lasix.  BiPAP at bedside the respiratory rate improving and heart rate improving.  Denies chest pain. INR therapeutic at 3.5.  Low suspicion for pulmonary embolism.  Patient with new hypoxic respiratory failure likely secondary to CHF exacerbation. Low-dose IV nitroglycerin for flash pulmonary edema and likely hypertensive urgency  Respiratory rate improving.  Not hypoxic on 6 L nasal cannula.  Will hold on BiPAP at this time but it is available.  Admission discussed with Dr. Selinda Orion M. Barrie was evaluated in Emergency Department on 12/18/2020 for the symptoms  described in the history of present illness. He was  evaluated in the context of the global COVID-19 pandemic, which necessitated consideration that the patient might be at risk for infection with the SARS-CoV-2 virus that causes COVID-19. Institutional protocols and algorithms that pertain to the evaluation of patients at risk for COVID-19 are in a state of rapid change based on information released by regulatory bodies including the CDC and federal and state organizations. These policies and algorithms were followed during the patient's care in the ED.  Final Clinical Impression(s) / ED Diagnoses Final diagnoses:  Acute respiratory failure with hypoxia (HCC)  Congestive heart failure, unspecified HF chronicity, unspecified heart failure type [facility])    Rx / DC Orders ED Discharge Orders    None       Danielle Mink, Jeannett Senior, MD 12/18/20 [phone]

## 2020-12-18 NOTE — ED Triage Notes (Addendum)
Pt arrives with c/o increased shob. Much worse than yesterday when seen in ER. Pt sts waking up from sleep gasping and unable to catch breath. 20 at 84% ra. Hr 150, rr 48. 14 days post positive covid test.

## 2020-12-18 NOTE — ED Notes (Signed)
Pt voided urine

## 2020-12-19 DIAGNOSIS — Z952 Presence of prosthetic heart valve: Secondary | ICD-10-CM | POA: Diagnosis not present

## 2020-12-19 DIAGNOSIS — Z823 Family history of stroke: Secondary | ICD-10-CM | POA: Diagnosis not present

## 2020-12-19 DIAGNOSIS — Z7982 Long term (current) use of aspirin: Secondary | ICD-10-CM | POA: Diagnosis not present

## 2020-12-19 DIAGNOSIS — Z8616 Personal history of COVID-19: Secondary | ICD-10-CM | POA: Diagnosis not present

## 2020-12-19 DIAGNOSIS — I5043 Acute on chronic combined systolic (congestive) and diastolic (congestive) heart failure: Secondary | ICD-10-CM | POA: Diagnosis present

## 2020-12-19 DIAGNOSIS — D72829 Elevated white blood cell count, unspecified: Secondary | ICD-10-CM | POA: Diagnosis present

## 2020-12-19 DIAGNOSIS — Z7901 Long term (current) use of anticoagulants: Secondary | ICD-10-CM | POA: Diagnosis not present

## 2020-12-19 DIAGNOSIS — I351 Nonrheumatic aortic (valve) insufficiency: Secondary | ICD-10-CM | POA: Diagnosis present

## 2020-12-19 DIAGNOSIS — R0602 Shortness of breath: Secondary | ICD-10-CM | POA: Diagnosis present

## 2020-12-19 DIAGNOSIS — Z87891 Personal history of nicotine dependence: Secondary | ICD-10-CM | POA: Diagnosis not present

## 2020-12-19 DIAGNOSIS — Z20822 Contact with and (suspected) exposure to covid-19: Secondary | ICD-10-CM | POA: Diagnosis present

## 2020-12-19 DIAGNOSIS — I480 Paroxysmal atrial fibrillation: Secondary | ICD-10-CM | POA: Diagnosis present

## 2020-12-19 DIAGNOSIS — I110 Hypertensive heart disease with heart failure: Secondary | ICD-10-CM | POA: Diagnosis present

## 2020-12-19 DIAGNOSIS — Z8249 Family history of ischemic heart disease and other diseases of the circulatory system: Secondary | ICD-10-CM | POA: Diagnosis not present

## 2020-12-19 DIAGNOSIS — I160 Hypertensive urgency: Secondary | ICD-10-CM | POA: Diagnosis present

## 2020-12-19 DIAGNOSIS — J9601 Acute respiratory failure with hypoxia: Secondary | ICD-10-CM | POA: Diagnosis present

## 2020-12-19 DIAGNOSIS — Z79899 Other long term (current) drug therapy: Secondary | ICD-10-CM | POA: Diagnosis not present

## 2020-12-19 LAB — CBC
HCT: 29.1 % — ABNORMAL LOW (ref 39.0–52.0)
Hemoglobin: 8.8 g/dL — ABNORMAL LOW (ref 13.0–17.0)
MCH: 26.7 pg (ref 26.0–34.0)
MCHC: 30.2 g/dL (ref 30.0–36.0)
MCV: 88.4 fL (ref 80.0–100.0)
Platelets: 269 10*3/uL (ref 150–400)
RBC: 3.29 MIL/uL — ABNORMAL LOW (ref 4.22–5.81)
RDW: 18 % — ABNORMAL HIGH (ref 11.5–15.5)
WBC: 14.7 10*3/uL — ABNORMAL HIGH (ref 4.0–10.5)
nRBC: 0 % (ref 0.0–0.2)

## 2020-12-19 LAB — BASIC METABOLIC PANEL
Anion gap: 9 (ref 5–15)
BUN: 14 mg/dL (ref 6–20)
CO2: 27 mmol/L (ref 22–32)
Calcium: 9.1 mg/dL (ref 8.9–10.3)
Chloride: 102 mmol/L (ref 98–111)
Creatinine, Ser: 0.84 mg/dL (ref 0.61–1.24)
GFR, Estimated: >60 mL/min (ref >60)
Glucose, Bld: 118 mg/dL — ABNORMAL HIGH (ref 70–99)
Potassium: 3.5 mmol/L (ref 3.5–5.1)
Sodium: 138 mmol/L (ref 135–145)

## 2020-12-19 LAB — PROTIME-INR
INR: 3.1 — ABNORMAL HIGH (ref 0.8–1.2)
Prothrombin Time: 30.7 seconds — ABNORMAL HIGH (ref 11.4–15.2)

## 2020-12-19 MED ORDER — WARFARIN SODIUM 5 MG PO TABS
5.0000 mg | ORAL_TABLET | Freq: Once | ORAL | Status: AC
  Administered 2020-12-19: 5 mg via ORAL
  Filled 2020-12-19: qty 1

## 2020-12-19 NOTE — Progress Notes (Signed)
PROGRESS NOTE    Melvin Burns  HUD:[id number] DOB: Mar 23, 1966 DOA: 12/18/2020 PCP: Chilton Si, MD   Chief Complaint  Patient presents with  . Shortness of Breath   Brief Narrative: 55 year old male with multiple medical history hypertension rheumatic heart disease status post mechanical valve/PAF on chronic Coumadin and aspirin, tobacco abuse, recent hospitalization 2/25-2/28 for hypoxic respiratory failure heart failure exacerbation and COVID-19 positivity managed with Lasix, 3 days of remdesivir and short course of steroid presents to the ER with worsening shortness of breath hypoxia.  Recently seen in the ED since discharge on 3/10 for similar presentation but deemed stable for discharge with instruction to follow-up with his cardiologist however return to ED 3/11 AM with worsening shortness of breath was hypoxic 84% on room air and tachypneic at 48.  In the ED,afebrile,tachycardic,tachypneic,hypertensive, hypoxic (SpO2 84% on room air) placed on 6 L/min. Notable Labs: Glucose 150 otherwise CMP unremarkable, BNP 128, Troponin 14->15, WBC 18, Hb 9.8, platelets 321, INR 3.5. Notable Imaging: CXR - mild atypical/viral infection or interstitial edema unchanged. Patient received Lasix 40 mg IV x 1, Aspirin 324 mg, nitro drip Patient was admitted on IV Lasix.  Subjective: Afebrile overnight.Oxygen weaned down to 2 L [place],rest of the vitals are stable. Leukocytosis downtrending. INR 3.1. Patient feels significantly better this morning. Reports he had hamburger and got worse w/ shortness of breath  Assessment & Plan:  Acute respiratory failure with hypoxia  Acute exacerbation of CHF with preserved EF Respiratory distress shortness of breath Improving with IV Lasix.SOB is significantly improved-continue on , IV Lasix 40 mg BID.  Suspect this is in the setting of dietary indiscretion.  Discussed about low-salt diet.Monitor intake output as below.Recent echo 2/25 with preserved EF  60 to 60%. Net IO Since Admission: -2,760 mL [12/19/20 0944]   Intake/Output Summary (Last 24 hours) at 12/19/2020 0944 Last data filed at 12/19/2020 0500 Gross per 24 hour  Intake 240 ml  Output 1600 ml  Net -1360 ml   Essential HTN:BP well controlled on Bystolic. Holding lisinopril for now.  Rheumatic heart disease S/P minimally-invasive MV replacement with mechanical valve PAF Rate controlled, recent echo unremarkable/stable, on Coumadin pharmacy dosing INR above 3. Recent Labs  Lab 12/17/20 0915 12/18/20 0525 12/19/20 0231  INR 3.2* 3.5* 3.1*   Leukocytosis chronic has been on recent steroids but appears to be chronic in nature.Dr.Gudena was consulted follow cytometry ordered to evaluate CLL and will need outpatient follow-up next week.  Recent Labs  Lab 12/17/20 0915 12/18/20 0525 12/19/20 0231  WBC 17.5* 17.9* 14.7*   Recent COVID-19 positive completed remdesivir 3 days a steroid 7 days. Diet Order            Diet 2 gram sodium Room service appropriate? Yes; Fluid consistency: Thin  Diet effective now               Patient's Body mass index is 23.93 kg/m. DVT prophylaxis: Coumadin with INR >2 Code Status:   Code Status: Full Code  Family Communication: plan of care discussed with patient at bedside.  Status is: admitted as Observation Remains hospitalized for continued management of CHF respiratory failure and continued need of IV Lasix  Dispo: The patient is from: Home              Anticipated d/c is to: Home next 1 to 2 days.              Patient currently is not medically stable to d/c.  Difficult to place patient No Unresulted Labs (From admission, onward)          Start     Ordered   12/19/20 0500  Basic metabolic panel  Daily,   R      12/18/20 0911   12/19/20 0500  CBC  Daily,   R      12/18/20 0911   12/19/20 0500  Protime-INR  Daily,   R      12/18/20 1050   12/18/20 0955  Flow Cytometry, Peripheral Blood (Oncology)  Once,   R        Comments: Eval for CLL    12/18/20 0955          Medications reviewed:  Scheduled Meds: . aspirin EC  81 mg Oral Daily  . chlorhexidine  15 mL Mouth Rinse BID  . Chlorhexidine Gluconate Cloth  6 each Topical Daily  . furosemide  40 mg Intravenous BID  . mouth rinse  15 mL Mouth Rinse q12n4p  . nebivolol  5 mg Oral Daily  . sodium chloride flush  3 mL Intravenous Q12H  . warfarin  5 mg Oral ONCE-1600  . Warfarin - Pharmacist Dosing Inpatient   Does not apply q1600   Continuous Infusions:  Consultants:see note  Procedures:see note  Antimicrobials: Anti-infectives (From admission, onward)   None     Culture/Microbiology    Component Value Date/Time   SDES  12/04/2020 0247    BLOOD LEFT ANTECUBITAL Performed at [facility], [address] [place], [place] [address]    SDES  12/04/2020 0247    IN/OUT CATH URINE Performed at [facility], [address] [place], [place] [address]    SPECREQUEST  12/04/2020 0247    BOTTLES DRAWN AEROBIC AND ANAEROBIC Blood Culture results may not be optimal due to an inadequate volume of blood received in culture bottles Performed at [facility], [address] [place], [place] [address]    SPECREQUEST  12/04/2020 0247    NONE Performed at [facility], [address] [place], [place] [address]    CULT  12/04/2020 0247    NO GROWTH 5 DAYS Performed at [facility], [address] [place], [place] [address]    CULT  12/04/2020 0247    NO GROWTH Performed at [facility], [address] [place], [place] [address]    REPTSTATUS 12/09/2020 FINAL 12/04/2020 0247   REPTSTATUS 12/05/2020 FINAL 12/04/2020 0247    Other culture-see note  Objective: Vitals: Today's Vitals   12/19/20 0500 12/19/20 0600 12/19/20 0700 12/19/20 0818  BP: 98/71 99/71 100/69   Pulse: 82 80 77   Resp: 14 16 12    Temp:    98.2 F (36.8 C)  TempSrc:    Oral   SpO2: 96% 98% 98%   Weight: 73.5 kg     PainSc:        Intake/Output Summary (Last 24 hours) at 12/19/2020 0944 Last data filed at 12/19/2020 0500 Gross per 24 hour  Intake 240 ml  Output 1600 ml  Net -1360 ml   Filed Weights   12/19/20 0500  Weight: 73.5 kg   Weight change:   Intake/Output from previous day: 03/11 0701 - 03/12 0700 In: 240 [P.O.:240] Out: 2200 [Urine:2200] Intake/Output this shift: No intake/output data recorded. Filed Weights   12/19/20 0500  Weight: 73.5 kg    Examination: General exam:AAOx3,NAD,weak appearing. HEENT:Oral mucosa moist,Ear/Nose WNL grossly,dentition normal. Respiratory system:Bilaterally  diminished at base,no use of accessory muscle, non tender. Cardiovascular system:S1 & S2 +,regular,No JVD. Gastrointestinal system:Abdomen soft, NT,ND, BS+. Nervous System:Alert, awake, moving extremities and grossly nonfocal. Extremities:No edema,distal peripheral pulses palpable.  Skin:No rashes,no icterus. [name]:WJXBJY muscle bulk,tone, power.  Data Reviewed: I have personally reviewed following labs and imaging studies CBC: Recent Labs  Lab 12/17/20 0915 12/18/20 0525 12/19/20 0231  WBC 17.5* 17.9* 14.7*  NEUTROABS 13.1* 14.5*  --   HGB 9.0* 9.8* 8.8*  HCT 29.3* 32.2* 29.1*  MCV 89.6 89.9 88.4  PLT 271 321 269   Basic Metabolic Panel: Recent Labs  Lab 12/17/20 0915 12/18/20 0525 12/18/20 0645 12/19/20 0231  NA 137 137  --  138  K 3.9 3.7  --  3.5  CL 104 104  --  102  CO2 25 23  --  27  GLUCOSE 109* 150*  --  118*  BUN 12 15  --  14  CREATININE 0.84 1.06  --  0.84  CALCIUM 8.9 9.2  --  9.1  MG  --   --  1.9  --    GFR: Estimated Creatinine Clearance: 98.2 mL/min (by C-G formula based on SCr of 0.84 mg/dL). Liver Function Tests: Recent Labs  Lab 12/18/20 0525  AST 36  ALT 14  ALKPHOS 72  BILITOT 1.0  PROT 7.9  ALBUMIN 3.5   No results for input(s): LIPASE, AMYLASE in the last 168 hours. No results for input(s):  AMMONIA in the last 168 hours. Coagulation Profile: Recent Labs  Lab 12/17/20 0915 12/18/20 0525 12/19/20 0231  INR 3.2* 3.5* 3.1*   Cardiac Enzymes: No results for input(s): CKTOTAL, CKMB, CKMBINDEX, TROPONINI in the last 168 hours. BNP (last 3 results) No results for input(s): PROBNP in the last 8760 hours. HbA1C: No results for input(s): HGBA1C in the last 72 hours. CBG: No results for input(s): GLUCAP in the last 168 hours. Lipid Profile: No results for input(s): CHOL, HDL, LDLCALC, TRIG, CHOLHDL, LDLDIRECT in the last 72 hours. Thyroid Function Tests: No results for input(s): TSH, T4TOTAL, FREET4, T3FREE, THYROIDAB in the last 72 hours. Anemia Panel: No results for input(s): VITAMINB12, FOLATE, FERRITIN, TIBC, IRON, RETICCTPCT in the last 72 hours. Sepsis Labs: No results for input(s): PROCALCITON, LATICACIDVEN in the last 168 hours.  Recent Results (from the past 240 hour(s))  Resp Panel by RT-PCR (Flu A&B, Covid) Nasopharyngeal Swab     Status: None   Collection Time: 12/18/20  5:25 AM   Specimen: Nasopharyngeal Swab; Nasopharyngeal(NP) swabs in vial transport medium  Result Value Ref Range Status   SARS Coronavirus 2 by RT PCR NEGATIVE NEGATIVE Final    Comment: (NOTE) SARS-CoV-2 target nucleic acids are NOT DETECTED.  The SARS-CoV-2 RNA is generally detectable in upper respiratory specimens during the acute phase of infection. The lowest concentration of SARS-CoV-2 viral copies this assay can detect is 138 copies/mL. A negative result does not preclude SARS-Cov-2 infection and should not be used as the sole basis for treatment or other patient management decisions. A negative result may occur with  improper specimen collection/handling, submission of specimen other than nasopharyngeal swab, presence of viral mutation(s) within the areas targeted by this assay, and inadequate number of viral copies(<138 copies/mL). A negative result must be combined with clinical  observations, patient history, and epidemiological information. The expected result is Negative.  Fact Sheet for Patients:  BloggerCourse.com  Fact Sheet for Healthcare Providers:  SeriousBroker.it  This test is no t yet approved or cleared by the  [place] [employer] and  has been authorized for detection and/or diagnosis of SARS-CoV-2 by FDA under an TEFL teacher (EUA). This EUA will remain  in effect (meaning this test can be used) for the duration of the COVID-19 declaration under Section 564(b)(1) of the Act, 21 U.S.C.section 360bbb-3(b)(1), unless the authorization is terminated  or revoked sooner.       Influenza A by PCR NEGATIVE NEGATIVE Final   Influenza B by PCR NEGATIVE NEGATIVE Final    Comment: (NOTE) The Xpert Xpress SARS-CoV-2/FLU/RSV plus assay is intended as an aid in the diagnosis of influenza from Nasopharyngeal swab specimens and should not be used as a sole basis for treatment. Nasal washings and aspirates are unacceptable for Xpert Xpress SARS-CoV-2/FLU/RSV testing.  Fact Sheet for Patients: BloggerCourse.com  Fact Sheet for Healthcare Providers: SeriousBroker.it  This test is not yet approved or cleared by the [place] FDA and has been authorized for detection and/or diagnosis of SARS-CoV-2 by FDA under an Emergency Use Authorization (EUA). This EUA will remain in effect (meaning this test can be used) for the duration of the COVID-19 declaration under Section 564(b)(1) of the Act, 21 U.S.C. section 360bbb-3(b)(1), unless the authorization is terminated or revoked.  Performed at [facility], [address] [place], [place] [address]   MRSA PCR Screening     Status: None   Collection Time: 12/18/20  8:21 AM   Specimen: Nasopharyngeal  Result Value Ref Range Status   MRSA by PCR NEGATIVE NEGATIVE Final     Comment:        The GeneXpert MRSA Assay (FDA approved for NASAL specimens only), is one component of a comprehensive MRSA colonization surveillance program. It is not intended to diagnose MRSA infection nor to guide or monitor treatment for MRSA infections. Performed at [facility], [address], [place], [place] [address]      Radiology Studies: DG Chest Portable 1 View  Result Date: 12/18/2020 CLINICAL DATA:  Shortness of breath EXAM: PORTABLE CHEST 1 VIEW COMPARISON:  12/17/2020 FINDINGS: Increased interstitial markings with mild patchy opacities in the lungs bilaterally, favoring with mild atypical/viral infection or mild interstitial edema, grossly unchanged. No definite pleural effusions.  No pneumothorax. Heart is normal in size.  Prosthetic valve. IMPRESSION: Mild atypical/viral infection or interstitial edema, grossly unchanged. No definite pleural effusions. Electronically Signed   By: Charline Bills M.D.   On: 12/18/2020 06:14     LOS: 0 days   Lanae Boast, MD [facility]  12/19/2020, 9:44 AM

## 2020-12-19 NOTE — Progress Notes (Signed)
ANTICOAGULATION CONSULT NOTE - Initial Consult  Pharmacy Consult for warfarin Indication: Mechanical mitral valve, afib  No Known Allergies  Patient Measurements: Weight: 73.5 kg (162 lb 0.6 oz) Heparin Dosing Weight: n/a  Vital Signs: Temp: 98.1 F (36.7 C) (03/12 0400) Temp Source: Oral (03/12 0400) BP: 100/69 (03/12 0700) Pulse Rate: 77 (03/12 0700)  Labs: Recent Labs    12/17/20 0915 12/18/20 0525 12/18/20 0645 12/19/20 0231  HGB 9.0* 9.8*  --  8.8*  HCT 29.3* 32.2*  --  29.1*  PLT 271 321  --  269  LABPROT 31.6* 34.0*  --  30.7*  INR 3.2* 3.5*  --  3.1*  CREATININE 0.84 1.06  --  0.84  TROPONINIHS  --  14 15  --     Estimated Creatinine Clearance: 98.2 mL/min (by C-G formula based on SCr of 0.84 mg/dL).   Medical History: Past Medical History:  Diagnosis Date  . Hypertension   . Incidental pulmonary nodule 01/08/2020   Multiple in right lung - need f/u CT 1 year due to h/o smoking  . Mild aortic stenosis   . Mitral valve stenosis, severe   . Moderate mitral regurgitation   . Pneumonia    x 1  . Rheumatic mitral stenosis with regurgitation    Echo 08/08/16:  Severe mitral stenosis (mean gradient 17 mmHg).  Moderate mitral regurgitation.  . 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 Wears glasses   . Wears partial dentures    upper    Medications: Warfarin chronically PTA Home dose: warfarin 5 mg TTS, 7.5 mg all other days of the week. (See anticoag clinic notes from 2/23) Last dose: 3/10  Assessment: Pt is a 54 yoM chronically on warfarin for history of mechanical mitral valve as well as atrial fibrillation. Pt admitted with SOB likely 2/2 HF exacerbation. Pharmacy consulted to manage warfarin inpatient.   Today, 12/19/20  INR 3.1, therapeutic  CBC: Hgb 8.8 down slightly from admission. No reports of bleeding  per charting.  Pltc wnl.  Albumin WNL  Diet: Sodium restricted; meal intake not charted  DDI: No significant drug interactions. HF exacerbation may enhance effects of warfarin  Goal of Therapy:  INR 2.5 - 3.5    Plan:   Warfarin 5 mg PO today (as per home regimen)  INR daily while inpatient  Elie Goody, PharmD, BCPS 12/19/2020  7:43 AM Please see AMION for all Pharmacists' Contact Phone Numbers

## 2020-12-20 LAB — CBC
HCT: 30.6 % — ABNORMAL LOW (ref 39.0–52.0)
Hemoglobin: 9.5 g/dL — ABNORMAL LOW (ref 13.0–17.0)
MCH: 27.4 pg (ref 26.0–34.0)
MCHC: 31 g/dL (ref 30.0–36.0)
MCV: 88.2 fL (ref 80.0–100.0)
Platelets: 322 10*3/uL (ref 150–400)
RBC: 3.47 MIL/uL — ABNORMAL LOW (ref 4.22–5.81)
RDW: 17.9 % — ABNORMAL HIGH (ref 11.5–15.5)
WBC: 15.7 10*3/uL — ABNORMAL HIGH (ref 4.0–10.5)
nRBC: 0 % (ref 0.0–0.2)

## 2020-12-20 LAB — BASIC METABOLIC PANEL
Anion gap: 10 (ref 5–15)
BUN: 16 mg/dL (ref 6–20)
CO2: 25 mmol/L (ref 22–32)
Calcium: 9.3 mg/dL (ref 8.9–10.3)
Chloride: 100 mmol/L (ref 98–111)
Creatinine, Ser: 0.87 mg/dL (ref 0.61–1.24)
GFR, Estimated: >60 mL/min (ref >60)
Glucose, Bld: 120 mg/dL — ABNORMAL HIGH (ref 70–99)
Potassium: 3.7 mmol/L (ref 3.5–5.1)
Sodium: 135 mmol/L (ref 135–145)

## 2020-12-20 LAB — PROTIME-INR
INR: 2.7 — ABNORMAL HIGH (ref 0.8–1.2)
Prothrombin Time: 27.5 seconds — ABNORMAL HIGH (ref 11.4–15.2)

## 2020-12-20 MED ORDER — WARFARIN SODIUM 5 MG PO TABS
7.5000 mg | ORAL_TABLET | Freq: Every day | ORAL | Status: DC
  Administered 2020-12-20: 7.5 mg via ORAL
  Filled 2020-12-20: qty 1

## 2020-12-20 MED ORDER — FUROSEMIDE 40 MG PO TABS
40.0000 mg | ORAL_TABLET | Freq: Two times a day (BID) | ORAL | Status: DC
  Administered 2020-12-20 – 2020-12-21 (×2): 40 mg via ORAL
  Filled 2020-12-20 (×2): qty 1

## 2020-12-20 NOTE — Progress Notes (Addendum)
PROGRESS NOTE    Melvin Burns  JKD:[id number] DOB: December 21, 1965 DOA: 12/18/2020 PCP: Chilton Si, MD   Chief Complaint  Patient presents with  . Shortness of Breath   Brief Narrative: 55 year old male with multiple medical history hypertension rheumatic heart disease status post mechanical valve/PAF on chronic Coumadin and aspirin, tobacco abuse, recent hospitalization 2/25-2/28 for hypoxic respiratory failure heart failure exacerbation and COVID-19 positivity managed with Lasix, 3 days of remdesivir and short course of steroid presents to the ER with worsening shortness of breath hypoxia.  Recently seen in the ED since discharge on 3/10 for similar presentation but deemed stable for discharge with instruction to follow-up with his cardiologist however return to ED 3/11 AM with worsening shortness of breath was hypoxic 84% on room air and tachypneic at 48.  In the ED,afebrile,tachycardic,tachypneic,hypertensive, hypoxic (SpO2 84% on room air) placed on 6 L/min. Notable Labs: Glucose 150 otherwise CMP unremarkable, BNP 128, Troponin 14->15, WBC 18, Hb 9.8, platelets 321, INR 3.5. Notable Imaging: CXR - mild atypical/viral infection or interstitial edema unchanged. Patient received Lasix 40 mg IV x 1, Aspirin 324 mg, nitro drip Patient was admitted on IV Lasix, supplemental oxygen.  Patient was feeling significantly better after Lasix.  Subjective: Afebrile overnight, WBC counts remain up. O2 down to 1 L AND NOW OFF, doing well on room air. Has not been ambulating yet.  Assessment & Plan:  Acute respiratory failure with hypoxia  Acute exacerbation of CHF with preserved EF Respiratory distress shortness of breath Overall clinically improving, net -2.8 L.  Diuresed well with IV Lasix -change to oral Lasix will likely go home on 40 mg twice daily- off oxygen.Suspect this is in the setting of dietary indiscretion.  Discussed about low-salt diet. Monitor intake output as below .Recent  echo 2/25 with preserved EF 60 to 60%.  Encourage ambulation Net IO Since Admission: -2,820 mL [12/20/20 0742]   Intake/Output Summary (Last 24 hours) at 12/20/2020 0742 Last data filed at 12/19/2020 1838 Gross per 24 hour  Intake 240 ml  Output 300 ml  Net -60 ml   Essential HTN: Well-controlled on Nevibolol.Holding lisinopril for now.  Rheumatic heart disease S/P minimally-invasive MV replacement with mechanical valve PAF A. fib is rate controlled.  Recent echo unremarkable/stable.  INR therapeutic And pharmacy dosing.Check daily INR.   Recent Labs  Lab 12/17/20 0915 12/18/20 0525 12/19/20 0231 12/20/20 0604  INR 3.2* 3.5* 3.1* 2.7*   Leukocytosis chronic has been on recent steroids but appears to be chronic in nature.Dr.Gudena was consulted follow cytometry ordered -and will be done tomorrow as per lab, to evaluate CLL and will need outpatient follow-up next week.  Recent Labs  Lab 12/17/20 0915 12/18/20 0525 12/19/20 0231 12/20/20 0604  WBC 17.5* 17.9* 14.7* 15.7*   Recent COVID-19 positive completed remdesivir 3 days a steroid 7 days.  Currently not active. Diet Order            Diet 2 gram sodium Room service appropriate? Yes; Fluid consistency: Thin  Diet effective now               Patient's Body mass index is 23.93 kg/m. DVT prophylaxis: Coumadin with INR >2 Code Status:   Code Status: Full Code  Family Communication: plan of care discussed with patient at bedside.  Status is: Inpatient Remains hospitalized for continued management of CHF  Dispo: The patient is from: Home              Anticipated d/c is to:  Home tomorrow.  Encourage ambulation today              Patient currently is not medically stable to d/c.   Difficult to place patient No Unresulted Labs (From admission, onward)          Start     Ordered   12/20/20 0742  Flow Cytometry, Peripheral Blood (Oncology)  Once,   R       Question:  Specimen collection method  Answer:  Lab=Lab collect    12/20/20 0742   12/19/20 0500  Basic metabolic panel  Daily,   R      12/18/20 0911   12/19/20 0500  CBC  Daily,   R      12/18/20 0911   12/19/20 0500  Protime-INR  Daily,   R      12/18/20 1050   12/18/20 0955  Flow Cytometry, Peripheral Blood (Oncology)  Once,   R       Comments: Eval for CLL    12/18/20 0955          Medications reviewed:  Scheduled Meds: . aspirin EC  81 mg Oral Daily  . chlorhexidine  15 mL Mouth Rinse BID  . Chlorhexidine Gluconate Cloth  6 each Topical Daily  . furosemide  40 mg Intravenous BID  . mouth rinse  15 mL Mouth Rinse q12n4p  . nebivolol  5 mg Oral Daily  . sodium chloride flush  3 mL Intravenous Q12H  . warfarin  7.5 mg Oral q1600  . Warfarin - Pharmacist Dosing Inpatient   Does not apply q1600   Continuous Infusions:  Consultants:see note  Procedures:see note  Antimicrobials: Anti-infectives (From admission, onward)   None     Culture/Microbiology    Component Value Date/Time   SDES  12/04/2020 0247    BLOOD LEFT ANTECUBITAL Performed at [facility], [address] [place], [place] [address]    SDES  12/04/2020 0247    IN/OUT CATH URINE Performed at [facility], [address], [place], [place] [address]    SPECREQUEST  12/04/2020 0247    BOTTLES DRAWN AEROBIC AND ANAEROBIC Blood Culture results may not be optimal due to an inadequate volume of blood received in culture bottles Performed at [facility], [address], [place], [place] [address]    SPECREQUEST  12/04/2020 0247    NONE Performed at [facility], [address] [place], [place] [address]    CULT  12/04/2020 0247    NO GROWTH 5 DAYS Performed at [facility], [address], [place], [place] [address]    CULT  12/04/2020 0247    NO GROWTH Performed at [facility], [address], [place], [place] [address]    REPTSTATUS 12/09/2020 FINAL 12/04/2020 0247    REPTSTATUS 12/05/2020 FINAL 12/04/2020 0247    Other culture-see note  Objective: Vitals: Today's Vitals   12/19/20 2207 12/20/20 0102 12/20/20 0137 12/20/20 0538  BP: 109/70  108/71 128/83  Pulse: 85  85 83  Resp: 18  14 14   Temp: 98.9 F (37.2 C)  98.7 F (37.1 C) 98.3 F (36.8 C)  TempSrc:   Oral Oral  SpO2: 97%  99% 97%  Weight:      Height:  5\' 9"  (1.753 m)    PainSc:        Intake/Output Summary (Last 24 hours) at 12/20/2020 0742 Last data filed at 12/19/2020 1838 Gross per 24  hour  Intake 240 ml  Output 300 ml  Net -60 ml   Filed Weights   12/19/20 0500  Weight: 73.5 kg   Weight change:   Intake/Output from previous day: 03/12 0701 - 03/13 0700 In: 240 [P.O.:240] Out: 300 [Urine:300] Intake/Output this shift: No intake/output data recorded. Filed Weights   12/19/20 0500  Weight: 73.5 kg    Examination: General exam: AAOx3,NAD, weak appearing. HEENT:Oral mucosa moist, Ear/Nose WNL grossly, dentition normal. Respiratory system: bilaterally diminishedd,no wheezing or crackles,no use of accessory muscle Cardiovascular system: S1 & S2 +, No JVD,. Gastrointestinal system: Abdomen soft, NT,ND, BS+ Nervous System:Alert, awake, moving extremities and grossly nonfocal Extremities: No edema, distal peripheral pulses palpable.  Skin: No rashes,no icterus. MSK: Normal muscle bulk,tone, power  Data Reviewed: I have personally reviewed following labs and imaging studies CBC: Recent Labs  Lab 12/17/20 0915 12/18/20 0525 12/19/20 0231 12/20/20 0604  WBC 17.5* 17.9* 14.7* 15.7*  NEUTROABS 13.1* 14.5*  --   --   HGB 9.0* 9.8* 8.8* 9.5*  HCT 29.3* 32.2* 29.1* 30.6*  MCV 89.6 89.9 88.4 88.2  PLT 271 321 269 322   Basic Metabolic Panel: Recent Labs  Lab 12/17/20 0915 12/18/20 0525 12/18/20 0645 12/19/20 0231  NA 137 137  --  138  K 3.9 3.7  --  3.5  CL 104 104  --  102  CO2 25 23  --  27  GLUCOSE 109* 150*  --  118*  BUN 12 15  --  14  CREATININE  0.84 1.06  --  0.84  CALCIUM 8.9 9.2  --  9.1  MG  --   --  1.9  --    GFR: Estimated Creatinine Clearance: 98.2 mL/min (by C-G formula based on SCr of 0.84 mg/dL). Liver Function Tests: Recent Labs  Lab 12/18/20 0525  AST 36  ALT 14  ALKPHOS 72  BILITOT 1.0  PROT 7.9  ALBUMIN 3.5   No results for input(s): LIPASE, AMYLASE in the last 168 hours. No results for input(s): AMMONIA in the last 168 hours. Coagulation Profile: Recent Labs  Lab 12/17/20 0915 12/18/20 0525 12/19/20 0231 12/20/20 0604  INR 3.2* 3.5* 3.1* 2.7*   Cardiac Enzymes: No results for input(s): CKTOTAL, CKMB, CKMBINDEX, TROPONINI in the last 168 hours. BNP (last 3 results) No results for input(s): PROBNP in the last 8760 hours. HbA1C: No results for input(s): HGBA1C in the last 72 hours. CBG: No results for input(s): GLUCAP in the last 168 hours. Lipid Profile: No results for input(s): CHOL, HDL, LDLCALC, TRIG, CHOLHDL, LDLDIRECT in the last 72 hours. Thyroid Function Tests: No results for input(s): TSH, T4TOTAL, FREET4, T3FREE, THYROIDAB in the last 72 hours. Anemia Panel: No results for input(s): VITAMINB12, FOLATE, FERRITIN, TIBC, IRON, RETICCTPCT in the last 72 hours. Sepsis Labs: No results for input(s): PROCALCITON, LATICACIDVEN in the last 168 hours.  Recent Results (from the past 240 hour(s))  Resp Panel by RT-PCR (Flu A&B, Covid) Nasopharyngeal Swab     Status: None   Collection Time: 12/18/20  5:25 AM   Specimen: Nasopharyngeal Swab; Nasopharyngeal(NP) swabs in vial transport medium  Result Value Ref Range Status   SARS Coronavirus 2 by RT PCR NEGATIVE NEGATIVE Final    Comment: (NOTE) SARS-CoV-2 target nucleic acids are NOT DETECTED.  The SARS-CoV-2 RNA is generally detectable in upper respiratory specimens during the acute phase of infection. The lowest concentration of SARS-CoV-2 viral copies this assay can detect is 138 copies/mL. A negative result does  not preclude  SARS-Cov-2 infection and should not be used as the sole basis for treatment or other patient management decisions. A negative result may occur with  improper specimen collection/handling, submission of specimen other than nasopharyngeal swab, presence of viral mutation(s) within the areas targeted by this assay, and inadequate number of viral copies(<138 copies/mL). A negative result must be combined with clinical observations, patient history, and epidemiological information. The expected result is Negative.  Fact Sheet for Patients:  BloggerCourse.com  Fact Sheet for Healthcare Providers:  SeriousBroker.it  This test is no t yet approved or cleared by the [place] FDA and  has been authorized for detection and/or diagnosis of SARS-CoV-2 by FDA under an Emergency Use Authorization (EUA). This EUA will remain  in effect (meaning this test can be used) for the duration of the COVID-19 declaration under Section 564(b)(1) of the Act, 21 U.S.C.section 360bbb-3(b)(1), unless the authorization is terminated  or revoked sooner.       Influenza A by PCR NEGATIVE NEGATIVE Final   Influenza B by PCR NEGATIVE NEGATIVE Final    Comment: (NOTE) The Xpert Xpress SARS-CoV-2/FLU/RSV plus assay is intended as an aid in the diagnosis of influenza from Nasopharyngeal swab specimens and should not be used as a sole basis for treatment. Nasal washings and aspirates are unacceptable for Xpert Xpress SARS-CoV-2/FLU/RSV testing.  Fact Sheet for Patients: BloggerCourse.com  Fact Sheet for Healthcare Providers: SeriousBroker.it  This test is not yet approved or cleared by the [place] FDA and has been authorized for detection and/or diagnosis of SARS-CoV-2 by FDA under an Emergency Use Authorization (EUA). This EUA will remain in effect (meaning this test can be used) for the duration of  the COVID-19 declaration under Section 564(b)(1) of the Act, 21 U.S.C. section 360bbb-3(b)(1), unless the authorization is terminated or revoked.  Performed at [facility], [address], [place], [place] [address]   MRSA PCR Screening     Status: None   Collection Time: 12/18/20  8:21 AM   Specimen: Nasopharyngeal  Result Value Ref Range Status   MRSA by PCR NEGATIVE NEGATIVE Final    Comment:        The GeneXpert MRSA Assay (FDA approved for NASAL specimens only), is one component of a comprehensive MRSA colonization surveillance program. It is not intended to diagnose MRSA infection nor to guide or monitor treatment for MRSA infections. Performed at [facility], [address], [place], [place] [address]      Radiology Studies: No results found.   LOS: 1 day   Lanae Boast, MD [facility]  12/20/2020, 7:42 AM

## 2020-12-20 NOTE — Progress Notes (Signed)
ANTICOAGULATION CONSULT NOTE - Initial Consult  Pharmacy Consult for warfarin Indication: Mechanical mitral valve, afib  No Known Allergies  Patient Measurements: Height: 5\' 9"  (175.3 cm) (per previous recording) Weight: 73.5 kg (162 lb 0.6 oz) IBW/kg (Calculated) : 70.7 Heparin Dosing Weight: n/a  Vital Signs: Temp: 98.3 F (36.8 C) (03/13 0538) Temp Source: Oral (03/13 0538) BP: 128/83 (03/13 0538) Pulse Rate: 83 (03/13 0538)  Labs: Recent Labs    12/17/20 0915 12/18/20 0525 12/18/20 0645 12/19/20 0231 12/20/20 0604  HGB 9.0* 9.8*  --  8.8* 9.5*  HCT 29.3* 32.2*  --  29.1* 30.6*  PLT 271 321  --  269 322  LABPROT 31.6* 34.0*  --  30.7* 27.5*  INR 3.2* 3.5*  --  3.1* 2.7*  CREATININE 0.84 1.06  --  0.84  --   TROPONINIHS  --  14 15  --   --     Estimated Creatinine Clearance: 98.2 mL/min (by C-G formula based on SCr of 0.84 mg/dL).   Medical History: Past Medical History:  Diagnosis Date  . Hypertension   . Incidental pulmonary nodule 01/08/2020   Multiple in right lung - need f/u CT 1 year due to h/o smoking  . Mild aortic stenosis   . Mitral valve stenosis, severe   . Moderate mitral regurgitation   . Pneumonia    x 1  . Rheumatic mitral stenosis with regurgitation    Echo 08/08/16:  Severe mitral stenosis (mean gradient 17 mmHg).  Moderate mitral regurgitation.  . 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 Wears glasses   . Wears partial dentures    upper    Medications: Warfarin chronically PTA Home dose: warfarin 5 mg TTS, 7.5 mg all other days of the week. (See anticoag clinic notes from 2/23)  Assessment: Pt is a 54 yoM chronically on warfarin for history of mechanical mitral valve as well as atrial fibrillation. Pt admitted with SOB likely 2/2 HF exacerbation. Pharmacy consulted to manage warfarin inpatient.    Today, 12/20/20  INR remains therapeutic but down to 2.7  CBC: Hgb stable at 9.5. No reports of bleeding per charting.  Pltc wnl.  Albumin WNL  Diet: Sodium restricted; meal intake not charted  DDI: No significant drug interactions. HF exacerbation may enhance effects of warfarin  Goal of Therapy:  INR 2.5 - 3.5    Plan:   Warfarin 7.5 mg PO today (as per home regimen)  INR daily while inpatient  12/22/20, PharmD, BCPS, BCIDP Clinical Pharmacist 12/20/2020 7:23 AM

## 2020-12-21 LAB — BASIC METABOLIC PANEL
Anion gap: 14 (ref 5–15)
BUN: 16 mg/dL (ref 6–20)
CO2: 23 mmol/L (ref 22–32)
Calcium: 9.4 mg/dL (ref 8.9–10.3)
Chloride: 101 mmol/L (ref 98–111)
Creatinine, Ser: 1 mg/dL (ref 0.61–1.24)
GFR, Estimated: >60 mL/min (ref >60)
Glucose, Bld: 115 mg/dL — ABNORMAL HIGH (ref 70–99)
Potassium: 3.6 mmol/L (ref 3.5–5.1)
Sodium: 138 mmol/L (ref 135–145)

## 2020-12-21 LAB — CBC
HCT: 30.9 % — ABNORMAL LOW (ref 39.0–52.0)
Hemoglobin: 9.4 g/dL — ABNORMAL LOW (ref 13.0–17.0)
MCH: 26.8 pg (ref 26.0–34.0)
MCHC: 30.4 g/dL (ref 30.0–36.0)
MCV: 88 fL (ref 80.0–100.0)
Platelets: 318 10*3/uL (ref 150–400)
RBC: 3.51 MIL/uL — ABNORMAL LOW (ref 4.22–5.81)
RDW: 18.1 % — ABNORMAL HIGH (ref 11.5–15.5)
WBC: 13.6 10*3/uL — ABNORMAL HIGH (ref 4.0–10.5)
nRBC: 0 % (ref 0.0–0.2)

## 2020-12-21 LAB — PROTIME-INR
INR: 2.7 — ABNORMAL HIGH (ref 0.8–1.2)
Prothrombin Time: 27.8 seconds — ABNORMAL HIGH (ref 11.4–15.2)

## 2020-12-21 LAB — FLOW CYTOMETRY

## 2020-12-21 MED ORDER — WARFARIN SODIUM 5 MG PO TABS: 7.5000 mg | ORAL_TABLET | Freq: Once | ORAL | Status: DC

## 2020-12-21 MED ORDER — FUROSEMIDE 20 MG PO TABS: 40.0000 mg | ORAL_TABLET | Freq: Every day | ORAL | 0 refills | Status: DC

## 2020-12-21 NOTE — Progress Notes (Signed)
ANTICOAGULATION CONSULT NOTE - Initial Consult  Pharmacy Consult for warfarin Indication: Mechanical mitral valve, afib  No Known Allergies  Patient Measurements: Height: 5\' 9"  (175.3 cm) (per previous recording) Weight: 74 kg (163 lb 2.3 oz) IBW/kg (Calculated) : 70.7 Heparin Dosing Weight: n/a  Vital Signs: Temp: 98.2 F (36.8 C) (03/14 0810) Temp Source: Oral (03/14 0810) BP: 108/74 (03/14 0810) Pulse Rate: 83 (03/14 0810)  Labs: Recent Labs    12/19/20 0231 12/20/20 0604 12/21/20 0505  HGB 8.8* 9.5* 9.4*  HCT 29.1* 30.6* 30.9*  PLT 269 322 318  LABPROT 30.7* 27.5* 27.8*  INR 3.1* 2.7* 2.7*  CREATININE 0.84 0.87 1.00   Estimated Creatinine Clearance: 82.5 mL/min (by C-G formula based on SCr of 1 mg/dL).  Medical History: Past Medical History:  Diagnosis Date  . Hypertension   . Incidental pulmonary nodule 01/08/2020   Multiple in right lung - need f/u CT 1 year due to h/o smoking  . Mild aortic stenosis   . Mitral valve stenosis, severe   . Moderate mitral regurgitation   . Pneumonia    x 1  . Rheumatic mitral stenosis with regurgitation    Echo 08/08/16:  Severe mitral stenosis (mean gradient 17 mmHg).  Moderate mitral regurgitation.  . 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 Wears glasses   . Wears partial dentures    upper    Medications: Warfarin chronically PTA Home dose: warfarin 5 mg TTS, 7.5 mg all other days of the week. (See anticoag clinic notes from 2/23)  Assessment: Pt is a 54 yoM chronically on Warfarin for history of mechanical mitral valve as well as atrial fibrillation. Pt admitted with SOB likely 2/2 HF exacerbation. Pharmacy consulted to manage warfarin inpatient.   Today, 12/21/20  INR remains therapeutic but down to 2.7  CBC: Hgb stable at 9.5. No reports of bleeding per charting.  Pltc  wnl.  Albumin WNL  Diet: Sodium restricted; meal intake not charted  DDI: No significant drug interactions. HF exacerbation may enhance effects of warfarin  Goal of Therapy:  INR 2.5 - 3.5    Plan:   Warfarin 7.5 mg PO today   INR daily while inpatient  12/23/20 PharmD 12/21/2020 9:36 AM

## 2020-12-21 NOTE — Plan of Care (Signed)
  Problem: Education: Goal: Knowledge of General Education information will improve Description: Including pain rating scale, medication(s)/side effects and non-pharmacologic comfort measures Outcome: Adequate for Discharge   Problem: Health Behavior/Discharge Planning: Goal: Ability to manage health-related needs will improve Outcome: Adequate for Discharge   Problem: Clinical Measurements: Goal: Ability to maintain clinical measurements within normal limits will improve Outcome: Adequate for Discharge Goal: Will remain free from infection Outcome: Adequate for Discharge Goal: Diagnostic test results will improve Outcome: Adequate for Discharge Goal: Respiratory complications will improve Outcome: Adequate for Discharge Goal: Cardiovascular complication will be avoided Outcome: Adequate for Discharge   Problem: Clinical Measurements: Goal: Ability to maintain clinical measurements within normal limits will improve Outcome: Adequate for Discharge Goal: Will remain free from infection Outcome: Adequate for Discharge Goal: Diagnostic test results will improve Outcome: Adequate for Discharge Goal: Respiratory complications will improve Outcome: Adequate for Discharge Goal: Cardiovascular complication will be avoided Outcome: Adequate for Discharge   Problem: Activity: Goal: Risk for activity intolerance will decrease Outcome: Adequate for Discharge   Problem: Nutrition: Goal: Adequate nutrition will be maintained Outcome: Adequate for Discharge   Problem: Elimination: Goal: Will not experience complications related to bowel motility Outcome: Adequate for Discharge Goal: Will not experience complications related to urinary retention Outcome: Adequate for Discharge   Problem: Safety: Goal: Ability to remain free from injury will improve Outcome: Adequate for Discharge   Problem: Skin Integrity: Goal: Risk for impaired skin integrity will decrease Outcome: Adequate for  Discharge

## 2020-12-21 NOTE — Discharge Instructions (Signed)
Acute Respiratory Failure, Adult Acute respiratory failure is a condition that is a medical emergency. It can develop quickly, and it should be treated right away. There are two types of acute respiratory failure:  Type I respiratory failure is when the lungs are not able to get enough oxygen into the blood. This causes the blood oxygen level to drop.  Type II respiratory failure is when carbon dioxide is not passing from the lungs out of the body. This causes carbon dioxide to build up in the blood. A person may have one type of acute respiratory failure or have both types at the same time. What are the causes? Common causes of type I respiratory failure include:  Trauma to the lung, chest, ribs, or tissues around the lung.  Pneumonia.  Lung diseases, such as pulmonary fibrosis or asthma.  Smoke, chemical, or water inhalation.  A blood clot in the lungs (pulmonary embolism).  A blood infection (sepsis).  Heart attack. Common causes of type II respiratory failure include:  Stroke.  A spinal cord injury.  A drug or alcohol overdose.  A blood infection (sepsis).  Cardiac arrest. What increases the risk? This condition is more likely to develop in people who have:  Lung diseases such as asthma or chronic obstructive pulmonary disease (COPD).  A condition that damages or weakens the muscles, nerves, bones, or tissues that are involved in breathing, such as myasthenia gravis or Guillain-Barr syndrome.  A serious infection.  A health problem that blocks the unconscious reflex that is involved in breathing, such as hypothyroidism or sleep apnea. What are the signs or symptoms? Trouble breathing is the main symptom of acute respiratory failure. Symptoms may also include:  Fast breathing.  Restlessness or anxiety.  Breathing loudly (wheezing) and grunting.  Fast or irregular heartbeats (palpitations).  Confusion or changes in behavior.  Feeling tired (fatigue),  sleeping more than normal, or being hard to wake.  Skin, lips, or fingernails that appear blue (cyanosis). How is this diagnosed? This condition may be diagnosed based on:  Your medical history and a physical exam. Your health care provider will listen to your heart and lungs to check for abnormal sounds.  Tests to confirm the diagnosis and determine the cause of respiratory failure. These tests may include: ? Measuring the amount of oxygen in your blood (pulse oximetry). The measurement comes from a small device that is placed on your finger, earlobe, or toe. ? Blood tests to measure blood oxygen and carbon dioxide and to look for signs of infection. ? Tests on a sample of the fluid that surrounds the spinal cord (cerebrospinal fluid) or a sample of fluid that is drawn from the windpipe (trachea) to check for infections. ? Chest X-ray. ? Electrocardiogram (ECG) to look at the heart's electrical activity.   How is this treated? Treatment for this condition usually takes place in a hospital intensive care unit (ICU). Treatment depends on what is causing the condition. It may include one or more of these treatments:  Oxygen may be given through your nose or a face mask.  A device such as a continuous positive airway pressure (CPAP) machine or bi-level positive airway pressure (BPAP) machine may be used to help you breathe. The device gives you oxygen and pressure.  Breathing treatments, fluids, and other medicines may be given.  A ventilator may be used to help you breathe. The machine gives you oxygen and pressure. A tube is put into your mouth and trachea to connect the   ventilator. ? If this treatment is needed longer term, a tracheostomy may be placed. A tracheostomy is a breathing tube put through your neck into your trachea.  In extreme cases, extracorporeal life support (ECLS) may be used. This treatment temporarily takes over the function of the heart and lungs, supplying oxygen and  removing carbon dioxide. ECLS gives the lungs a chance to recover. Follow these instructions at home: Medicines  Take over-the-counter and prescription medicines only as told by your health care provider.  If you were prescribed an antibiotic medicine, take it as told by your health care provider. Do not stop using the antibiotic even if you start to feel better.  If you are taking blood thinners: ? Talk with your health care provider before you take any medicines that contain aspirin or NSAIDs, such as ibuprofen. These medicines increase your risk for dangerous bleeding. ? Take your medicine exactly as told, at the same time every day. ? Avoid activities that could cause injury or bruising, and follow instructions about how to prevent falls. ? Wear a medical alert bracelet or carry a card that lists what medicines you take. General instructions  Return to your normal activities as told by your health care provider. Ask your health care provider what activities are safe for you.  Do not use any products that contain nicotine or tobacco, such as cigarettes, e-cigarettes, and chewing tobacco. If you need help quitting, ask your health care provider.  Do not drink alcohol if: ? Your health care provider tells you not to drink. ? You are pregnant, may be pregnant, or are planning to become pregnant.  Wear compression stockings as told by your health care provider. These stockings help to prevent blood clots and reduce swelling in your legs.  Attend any physical therapy and pulmonary rehabilitation as told by your health care provider.  Keep all follow-up visits as told by your health care provider. This is important. How is this prevented?  If you have an infection or a medical condition that may lead to acute respiratory failure, make sure you get proper treatment. Contact a health care provider if:  You have a fever.  Your symptoms do not improve or they get worse. Get help right  away if:  You are having trouble breathing.  You lose consciousness.  You develop a fast heart rate.  Your fingers, lips, or other areas turn blue.  You are confused. These symptoms may represent a serious problem that is an emergency. Do not wait to see if the symptoms will go away. Get medical help right away. Call your local emergency services (911 in the [place] Do not drive yourself to the hospital. Summary  Acute respiratory failure is a medical emergency. It can develop quickly, and it should be treated right away.  Treatment for this condition usually takes place in a hospital intensive care unit (ICU). Treatment may include oxygen, fluids, and medicines. A device may be used to help you breathe, such as a ventilator.  Take over-the-counter and prescription medicines only as told by your health care provider.  Contact a health care provider if your symptoms do not improve or if they get worse. This information is not intended to replace advice given to you by your health care provider. Make sure you discuss any questions you have with your health care provider. Document Revised: 09/13/2019 Document Reviewed: 09/13/2019 [employer] Patient Education  2021 [employer]   Heart Failure, Diagnosis  Heart failure means that your heart  is not able to pump blood in the right way. This makes it hard for your body to work well. Heart failure is usually a long-term (chronic) condition. You must take good care of yourself and follow your treatment plan from your doctor. What are the causes?  High blood pressure.  Buildup of cholesterol and fat in the arteries.  Heart attack. This injures the heart muscle.  Heart valves that do not open and close properly.  Damage of the heart muscle. This is also called cardiomyopathy.  Infection of the heart muscle. This is also called myocarditis.  Lung disease. What increases the risk?  Getting older. The risk of heart failure goes up as a  person ages.  Being overweight.  Being male.  Use tobacco or nicotine products.  Abusing alcohol or drugs.  Having taken medicines that can damage the heart.  Having any of these conditions: ? Diabetes. ? Abnormal heart rhythms. ? Thyroid problems. ? Low blood counts (anemia).  Having a family history of heart failure. What are the signs or symptoms?  Shortness of breath.  Coughing.  Swelling of the feet, ankles, legs, or belly.  Losing or gaining weight for no reason.  Trouble breathing.  Waking from sleep because of the need to sit up and get more air.  Fast heartbeat.  Being very tired.  Feeling dizzy, or feeling like you may pass out (faint).  Having no desire to eat.  Feeling like you may vomit (nauseous).  Peeing (urinating) more at night.  Feeling confused. How is this treated? This condition may be treated with:  Medicines. These can be given to treat blood pressure and to make the heart muscles stronger.  Changes in your daily life. These may include: ? Eating a healthy diet. ? Staying at a healthy body weight. ? Quitting tobacco, alcohol, and drug use. ? Doing exercises. ? Participating in a cardiac rehabilitation program. This program helps you improve your health through exercise, education, and counseling.  Surgery. Surgery can be done to open blocked valves, or to put devices in the heart, such as pacemakers.  A donor heart (heart transplant). You will receive a healthy heart from a donor. Follow these instructions at home:  Treat other conditions as told by your doctor. These may include high blood pressure, diabetes, thyroid disease, or abnormal heart rhythms.  Learn as much as you can about heart failure.  Get support as you need it.  Keep all follow-up visits. Summary  Heart failure means that your heart is not able to pump blood in the right way.  This condition is often caused by high blood pressure, heart attack, or damage  of the heart muscle.  Symptoms of this condition include shortness of breath and swelling of the feet, ankles, legs, or belly. You may also feel very tired or feel like you may vomit.  You may be treated with medicines, surgery, or changes in your daily life.  Treat other health conditions as told by your doctor. This information is not intended to replace advice given to you by your health care provider. Make sure you discuss any questions you have with your health care provider. Document Revised: 04/18/2020 Document Reviewed: 04/18/2020 [employer] Patient Education  2021 [employer].

## 2020-12-21 NOTE — Discharge Summary (Signed)
Physician Discharge Summary  Melvin Burns NWG:[id number] DOB: [birth year] DOA: 12/18/2020  PCP: Chilton Si, MD  Admit date: 12/18/2020 Discharge date: 12/21/2020  Admitted From: Home Disposition: Home  Recommendations for Outpatient Follow-up:  1. Follow up with PCP in 1-2 weeks 2. Follow-up with hematology oncology 3. Follow-up with cardiology 4. Please obtain CBC in one week 5. Please follow up on the following pending results: Flow cytometry  Home Health: No Equipment/Devices: None  Discharge Condition: Stable Code Status:   Code Status: Full Code Diet recommendation:  Diet Order            Diet - low sodium heart healthy           Diet 2 gram sodium Room service appropriate? Yes; Fluid consistency: Thin  Diet effective now                  Brief/Interim Summary: 55 year old male with multiple medical history hypertension rheumatic heart disease status post mechanical valve/PAF on chronic Coumadin and aspirin, tobacco abuse, recent hospitalization 2/25-2/28 for hypoxic respiratory failure heart failure exacerbation and COVID-19 positivity managed with Lasix, 3 days of remdesivir and short course of steroid presents to the ER with worsening shortness of breath hypoxia.  Recently seen in the ED since discharge on 3/10 for similar presentation but deemed stable for discharge with instruction to follow-up with his cardiologist however return to ED 3/11 AM with worsening shortness of breath was hypoxic 84% on room air and tachypneic at 48.  In the ED,afebrile,tachycardic,tachypneic,hypertensive, hypoxic (SpO2 84% on room air) placed on6L/min. Notable Labs:Glucose 150 otherwise CMP unremarkable, BNP 128, Troponin 14->15, WBC 18, Hb 9.8, platelets 321, INR 3.5. Notable Imaging:CXR - mild atypical/viral infection or interstitial edema unchanged. Patient receivedLasix 40 mg IV x 1, Aspirin 324 mg, nitro drip Patient was admitted on IV Lasix, supplemental oxygen.   Patient was feeling significantly better after Lasix. Patient has been managed with IV Lasix his symptoms has essentially resolved, off oxygen ambulating well. He will follow up with cardiology.  She has episodes [place] instructed on low-salt diet.  Advised to keep Lasix 40 mg twice daily for 1 week after which can go back to 40 mg daily. He had leukocytosis polycythemic was ordered as per Dr. Pamelia Hoit and will need to be followed up, ambulatory referral ordered with oncology clinic  Discharge Diagnoses:  Acute respiratory failure with hypoxia  Acute exacerbation of CHF with preserved EF Respiratory distress shortness of breath Overall clinically improved.Diuresed well with IV Lasix -changed to oral Lasix and tolerated well.  Continue Lasix 40 twice daily for 1 week then 40-minute daily home dose.  Follow-up with cardiology.  She is 69 striction provided to watch for fluid intake monitor daily weight, monitor symptoms and also counseled on dietary discretion Net IO Since Admission: -2,220 mL [12/21/20 0951]   Essential HTN: Well-controlled on bystolic, resume home lisinopril.   Rheumatic heart disease S/P minimally-invasive MV replacement with mechanical valve PAF A. fib is rate controlled.  Recent echo unremarkable/stable.  INR therapeutic Outpatient INR follow-up Recent Labs  Lab 12/17/20 0915 12/18/20 0525 12/19/20 0231 12/20/20 0604 12/21/20 0505  INR 3.2* 3.5* 3.1* 2.7* 2.7*   Leukocytosis chronic has been on recent steroids but appears to be chronic in nature.Dr.Gudena was consulted follow cytometry ordered -and patient will follow up with Dr. Pamelia Hoit ambulatory referral provided.   Recent Labs  Lab 12/17/20 0915 12/18/20 0525 12/19/20 0231 12/20/20 0604 12/21/20 0505  WBC 17.5* 17.9* 14.7* 15.7* 13.6*  Recent COVID-19 positive completed remdesivir 3 days a steroid 7 days.  Currently not active. Diet Order            Diet - low sodium heart healthy           Diet 2 gram  sodium Room service appropriate? Yes; Fluid consistency: Thin  Diet effective now               Patient's Body mass index is 24.09 kg/m.   Consults:  Dr. Pamelia Hoit over the phone  Subjective: Alert awake oriented not in distress, ambulating. Discharge Exam: Vitals:   12/21/20 0446 12/21/20 0810  BP: 106/74 108/74  Pulse: 82 83  Resp: 15 17  Temp: 98.6 F (37 C) 98.2 F (36.8 C)  SpO2: 97% 97%   General: Pt is alert, awake, not in acute distress Cardiovascular: RRR, S1/S2 +, no rubs, no gallops Respiratory: CTA bilaterally, no wheezing, no rhonchi Abdominal: Soft, NT, ND, bowel sounds + Extremities: no edema, no cyanosis  Discharge Instructions  Discharge Instructions    (HEART FAILURE PATIENTS) Call MD:  Anytime you have any of the following symptoms: 1) 3 pound weight gain in 24 hours or 5 pounds in 1 week 2) shortness of breath, with or without a dry hacking cough 3) swelling in the hands, feet or stomach 4) if you have to sleep on extra pillows at night in order to breathe.   Complete by: As directed    Ambulatory referral to Hematology / Oncology   Complete by: As directed    Persistent leukocytosis.  Flow cytometry ordered and pending as per Dr. Earmon Phoenix recommendation   Diet - low sodium heart healthy   Complete by: As directed    Discharge instructions   Complete by: As directed    Please call call MD or return to ER for similar or worsening recurring problem that brought you to hospital or if any fever,nausea/vomiting,abdominal pain, uncontrolled pain, chest pain,  shortness of breath or any other alarming symptoms.  Please take Lasix 40 mg twice a day for 1 week after that go back to your usual dose of 40 mg daily  Please follow-up with hematology oncology and cardiology from the last discharge  Please follow-up your doctor as instructed in a week time and call the office for appointment.  Please avoid alcohol, smoking, or any other illicit substance and  maintain healthy habits including taking your regular medications as prescribed.  You were cared for by a hospitalist during your hospital stay. If you have any questions about your discharge medications or the care you received while you were in the hospital after you are discharged, you can call the unit and ask to speak with the hospitalist on call if the hospitalist that took care of you is not available.  Once you are discharged, your primary care physician will handle any further medical issues. Please note that NO REFILLS for any discharge medications will be authorized once you are discharged, as it is imperative that you return to your primary care physician (or establish a relationship with a primary care physician if you do not have one) for your aftercare needs so that they can reassess your need for medications and monitor your lab values   Please,monitor weight daily,avoid strain and stop activity anytime you have chest pain or worsening shortness of breath.   Increase activity slowly   Complete by: As directed      Allergies as of 12/21/2020   No Known  Allergies     Medication List    STOP taking these medications   amLODipine 10 MG tablet Commonly known as: NORVASC     TAKE these medications   aspirin EC 81 MG tablet Take 1 tablet (81 mg total) by mouth daily.   furosemide 20 MG tablet Commonly known as: Lasix Take 2 tablets (40 mg total) by mouth daily for 30 doses. Take 40 mg BID for 1 week then 40 mg daily What changed: additional instructions   lisinopril 10 MG tablet Commonly known as: ZESTRIL Take 1 tablet (10 mg total) by mouth daily - in the evenings   nebivolol 5 MG tablet Commonly known as: Bystolic Take 1 tablet (5 mg total) by mouth daily.   Repatha SureClick 140 MG/ML Soaj Generic drug: Evolocumab Inject 140 mg into the skin every 14 (fourteen) days.   warfarin 5 MG tablet Commonly known as: COUMADIN Take as directed. If you are unsure how to  take this medication, talk to your nurse or doctor. Original instructions: Take 1 tablet (5 mg total) by mouth daily at 4 PM. What changed:   how much to take  when to take this  additional instructions       Follow-up Information    Chilton Si, MD Follow up.   Specialty: Cardiology Contact information: [address] [place] [address] [phone]        Serena Croissant, MD. Call in 1 week(s).   Specialty: Hematology and Oncology Contact information: [address] [place] [address] [phone]              No Known Allergies  The results of significant diagnostics from this hospitalization (including imaging, microbiology, ancillary and laboratory) are listed below for reference.    Microbiology: Recent Results (from the past 240 hour(s))  Resp Panel by RT-PCR (Flu A&B, Covid) Nasopharyngeal Swab     Status: None   Collection Time: 12/18/20  5:25 AM   Specimen: Nasopharyngeal Swab; Nasopharyngeal(NP) swabs in vial transport medium  Result Value Ref Range Status   SARS Coronavirus 2 by RT PCR NEGATIVE NEGATIVE Final    Comment: (NOTE) SARS-CoV-2 target nucleic acids are NOT DETECTED.  The SARS-CoV-2 RNA is generally detectable in upper respiratory specimens during the acute phase of infection. The lowest concentration of SARS-CoV-2 viral copies this assay can detect is 138 copies/mL. A negative result does not preclude SARS-Cov-2 infection and should not be used as the sole basis for treatment or other patient management decisions. A negative result may occur with  improper specimen collection/handling, submission of specimen other than nasopharyngeal swab, presence of viral mutation(s) within the areas targeted by this assay, and inadequate number of viral copies(<138 copies/mL). A negative result must be combined with clinical observations, patient history, and epidemiological information. The expected result is  Negative.  Fact Sheet for Patients:  BloggerCourse.com  Fact Sheet for Healthcare Providers:  SeriousBroker.it  This test is no t yet approved or cleared by the [place] FDA and  has been authorized for detection and/or diagnosis of SARS-CoV-2 by FDA under an Emergency Use Authorization (EUA). This EUA will remain  in effect (meaning this test can be used) for the duration of the COVID-19 declaration under Section 564(b)(1) of the Act, 21 U.S.C.section 360bbb-3(b)(1), unless the authorization is terminated  or revoked sooner.       Influenza A by PCR NEGATIVE NEGATIVE Final   Influenza B by PCR NEGATIVE NEGATIVE Final    Comment: (  NOTE) The Xpert Xpress SARS-CoV-2/FLU/RSV plus assay is intended as an aid in the diagnosis of influenza from Nasopharyngeal swab specimens and should not be used as a sole basis for treatment. Nasal washings and aspirates are unacceptable for Xpert Xpress SARS-CoV-2/FLU/RSV testing.  Fact Sheet for Patients: BloggerCourse.com  Fact Sheet for Healthcare Providers: SeriousBroker.it  This test is not yet approved or cleared by the [place] FDA and has been authorized for detection and/or diagnosis of SARS-CoV-2 by FDA under an Emergency Use Authorization (EUA). This EUA will remain in effect (meaning this test can be used) for the duration of the COVID-19 declaration under Section 564(b)(1) of the Act, 21 U.S.C. section 360bbb-3(b)(1), unless the authorization is terminated or revoked.  Performed at [facility], [address] [place], [place] [address]   MRSA PCR Screening     Status: None   Collection Time: 12/18/20  8:21 AM   Specimen: Nasopharyngeal  Result Value Ref Range Status   MRSA by PCR NEGATIVE NEGATIVE Final    Comment:        The GeneXpert MRSA Assay (FDA approved for NASAL specimens only), is one  component of a comprehensive MRSA colonization surveillance program. It is not intended to diagnose MRSA infection nor to guide or monitor treatment for MRSA infections. Performed at [facility], [address], [place], [place] [address]     Procedures/Studies: DG Chest 2 View  Result Date: 12/17/2020 CLINICAL DATA:  55 year old male with COVID-19 diagnosed 13 days ago. Shortness of breath. EXAM: CHEST - 2 VIEW COMPARISON:  Portable chest 12/07/2020 and earlier. FINDINGS: PA and lateral views. Stable lung volumes and mediastinal contours. Prior cardiac valve replacement. Visualized tracheal air column is within normal limits. No pneumothorax or layering pleural effusion. Trace fluid in the pulmonary fissures is decreased compared to last month. Perihilar and diffuse coarse pulmonary interstitial opacity has recurred and closely resembles that on 12/04/2020. These changes are new since 2021. No consolidation. No osseous abnormality identified.  Negative visible bowel gas. IMPRESSION: Recurrent perihilar and widespread coarse pulmonary interstitial opacity, closely resembling that on 12/04/2020, which was improved on 12/07/2020. Main differential considerations are Recurrent Pulmonary Edema, progressive sequelae of COVID-19. Electronically Signed   By: Odessa Fleming M.D.   On: 12/17/2020 07:18   DG Chest 2 View  Result Date: 12/04/2020 CLINICAL DATA:  Shortness of breath, worsening over the last week. EXAM: CHEST - 2 VIEW COMPARISON:  04/06/2020 FINDINGS: Heart size is normal. Previous mitral valve replacement. Interstitial and alveolar pulmonary edema is present. Fluid in the fissures. Findings consistent with acute congestive heart failure. No consolidation or collapse. No acute bone finding. IMPRESSION: Congestive heart failure with interstitial and alveolar edema. Electronically Signed   By: Paulina Fusi M.D.   On: 12/04/2020 02:39   DG Chest Portable 1 View  Result Date:  12/18/2020 CLINICAL DATA:  Shortness of breath EXAM: PORTABLE CHEST 1 VIEW COMPARISON:  12/17/2020 FINDINGS: Increased interstitial markings with mild patchy opacities in the lungs bilaterally, favoring with mild atypical/viral infection or mild interstitial edema, grossly unchanged. No definite pleural effusions.  No pneumothorax. Heart is normal in size.  Prosthetic valve. IMPRESSION: Mild atypical/viral infection or interstitial edema, grossly unchanged. No definite pleural effusions. Electronically Signed   By: Charline Bills M.D.   On: 12/18/2020 06:14   DG Chest Port 1 View  Addendum Date: 12/07/2020   ADDENDUM REPORT: 12/07/2020 11:12 ADDENDUM: Voice recognition error: The third sentence of the Findings section should  read "previous interstitial pattern is markedly decreased. " First impression should stay marked decrease in interstitial pattern, consistent with resolving congestive heart failure. Electronically Signed   By: Marin Robertshristopher  Mattern M.D.   On: 12/07/2020 11:12   Result Date: 12/07/2020 CLINICAL DATA:  Pulmonary edema EXAM: PORTABLE CHEST 1 VIEW COMPARISON:  Two-view chest x-ray 12/04/2020. FINDINGS: Heart size is normal. Aortic valve noted. Previous interstitial pattern is markedly increased. Minimal bibasilar atelectasis remains. Lung volumes are low. IMPRESSION: 1. Marked increase in interstitial pattern, consistent with congestive heart failure. 2. Minimal bibasilar atelectasis. Electronically Signed: By: Marin Robertshristopher  Mattern M.D. On: 12/07/2020 07:58   ECHOCARDIOGRAM COMPLETE  Result Date: 12/04/2020    ECHOCARDIOGRAM REPORT   Patient Name:   Melvin Burns Date of Exam: 12/04/2020 Medical Rec #:  [id number]           Height:       69.0 in Accession #:    [id number]          Weight:       180.0 lb Date of Birth:  05-10-1966          BSA:          1.976 m Patient Age:    54 years            BP:           105/66 mmHg Patient Gender: M                   HR:           77 bpm.  Exam Location:  Inpatient Procedure: 2D Echo, 3D Echo, Cardiac Doppler and Color Doppler Indications:    I50.23 Acute on chronic systolic (congestive) heart failure  History:        Patient has prior history of Echocardiogram examinations, most                 recent 05/08/2020. Mitral Valve Disease and Aortic Valve Disease;                 Risk Factors:Hypertension.                  Mitral Valve: 33 mm Sorin [employer] mechanical valve valve is                 present in the mitral position. Procedure Date: 03/06/2020.  Sonographer:    Elmarie Shileyiffany Dance Referring Phys: [id number] JARED E SEGAL IMPRESSIONS  1. Left ventricular ejection fraction, by estimation, is 60 to 65%. The left ventricle has normal function. The left ventricle has no regional wall motion abnormalities. There is mild left ventricular hypertrophy. Left ventricular diastolic parameters are indeterminate.  2. Right ventricular systolic function is normal. The right ventricular size is normal.  3. Left atrial size was mildly dilated.  4. The mitral valve has been repaired/replaced. No evidence of mitral valve regurgitation. No evidence of mitral stenosis. There is a 33 mm [employer]in [employer] mechanical valve present in the mitral position. Procedure Date: 03/06/2020. Echo findings are consistent with normal structure and function of the mitral valve prosthesis.  5. The aortic valve is normal in structure. Aortic valve regurgitation is moderate. No aortic stenosis is present.  6. The inferior vena cava is normal in size with greater than 50% respiratory variability, suggesting right atrial pressure of 3 mmHg. FINDINGS  Left Ventricle: Left ventricular ejection fraction, by estimation, is 60 to 65%. The left ventricle has normal function. The left ventricle has  no regional wall motion abnormalities. The left ventricular internal cavity size was normal in size. There is  mild left ventricular hypertrophy. Left ventricular diastolic parameters are indeterminate.  Right Ventricle: The right ventricular size is normal. No increase in right ventricular wall thickness. Right ventricular systolic function is normal. Left Atrium: Left atrial size was mildly dilated. Right Atrium: Right atrial size was normal in size. Pericardium: There is no evidence of pericardial effusion. Mitral Valve: The mitral valve has been repaired/replaced. No evidence of mitral valve regurgitation. There is a 33 mm Sorin [employer] mechanical valve present in the mitral position. Procedure Date: 03/06/2020. Echo findings are consistent with normal  structure and function of the mitral valve prosthesis. No evidence of mitral valve stenosis. MV peak gradient, 18.2 mmHg. The mean mitral valve gradient is 6.5 mmHg. Tricuspid Valve: The tricuspid valve is normal in structure. Tricuspid valve regurgitation is not demonstrated. No evidence of tricuspid stenosis. Aortic Valve: The aortic valve is normal in structure. Aortic valve regurgitation is moderate. Aortic regurgitation PHT measures 359 msec. No aortic stenosis is present. Aortic valve mean gradient measures 11.0 mmHg. Aortic valve peak gradient measures 20.5 mmHg. Aortic valve area, by VTI measures 1.27 cm. Pulmonic Valve: The pulmonic valve was normal in structure. Pulmonic valve regurgitation is not visualized. No evidence of pulmonic stenosis. Aorta: The aortic root is normal in size and structure. Venous: The inferior vena cava is normal in size with greater than 50% respiratory variability, suggesting right atrial pressure of 3 mmHg. IAS/Shunts: No atrial level shunt detected by color flow Doppler.  LEFT VENTRICLE PLAX 2D LVIDd:         5.30 cm LVIDs:         3.80 cm LV PW:         1.20 cm LV IVS:        1.00 cm LVOT diam:     1.90 cm LV SV:         54 LV SV Index:   27 LVOT Area:     2.84 cm  RIGHT VENTRICLE             IVC RV Mid diam:    1.70 cm     IVC diam: 1.10 cm RV S prime:     11.70 cm/s TAPSE (M-mode): 2.0 cm LEFT ATRIUM              Index       RIGHT ATRIUM           Index LA diam:        5.30 cm 2.68 cm/m  RA Area:     13.60 cm LA Vol (A2C):   61.0 ml 30.88 ml/m RA Volume:   26.50 ml  13.41 ml/m LA Vol (A4C):   79.2 ml 40.09 ml/m LA Biplane Vol: 70.9 ml 35.89 ml/m  AORTIC VALVE AV Area (Vmax):    1.20 cm AV Area (Vmean):   1.26 cm AV Area (VTI):     1.27 cm AV Vmax:           226.50 cm/s AV Vmean:          154.000 cm/s AV VTI:            0.420 m AV Peak Grad:      20.5 mmHg AV Mean Grad:      11.0 mmHg LVOT Vmax:         95.60 cm/s LVOT Vmean:        68.200 cm/s  LVOT VTI:          0.189 m LVOT/AV VTI ratio: 0.45 AI PHT:            359 msec  AORTA Ao Root diam: 2.90 cm Ao Asc diam:  3.30 cm MITRAL VALVE MV Area (PHT): 3.17 cm     SHUNTS MV Area VTI:   1.12 cm     Systemic VTI:  0.19 m MV Peak grad:  18.2 mmHg    Systemic Diam: 1.90 cm MV Mean grad:  6.5 mmHg MV Vmax:       2.13 m/s MV Vmean:      118.0 cm/s MV Decel Time: 239 msec MV E velocity: 203.00 cm/s MV A velocity: 93.80 cm/s MV E/A ratio:  2.16 Donato Schultz MD Electronically signed by Donato Schultz MD Signature Date/Time: 12/04/2020/3:55:20 PM    Final     Labs: BNP (last 3 results) Recent Labs    12/04/20 0247 12/18/20 0525  BNP 180.9* 128.3*   Basic Metabolic Panel: Recent Labs  Lab 12/17/20 0915 12/18/20 0525 12/18/20 0645 12/19/20 0231 12/20/20 0604 12/21/20 0505  NA 137 137  --  138 135 138  K 3.9 3.7  --  3.5 3.7 3.6  CL 104 104  --  102 100 101  CO2 25 23  --  GLUCOSE 109* 150*  --  118* 120* 115*  BUN 12 15  --  CREATININE 0.84 1.06  --  0.84 0.87 1.00  CALCIUM 8.9 9.2  --  9.1 9.3 9.4  MG  --   --  1.9  --   --   --    Liver Function Tests: Recent Labs  Lab 12/18/20 0525  AST 36  ALT 14  ALKPHOS 72  BILITOT 1.0  PROT 7.9  ALBUMIN 3.5   No results for input(s): LIPASE, AMYLASE in the last 168 hours. No results for input(s): AMMONIA in the last 168 hours. CBC: Recent Labs  Lab 12/17/20 0915 12/18/20 0525  12/19/20 0231 12/20/20 0604 12/21/20 0505  WBC 17.5* 17.9* 14.7* 15.7* 13.6*  NEUTROABS 13.1* 14.5*  --   --   --   HGB 9.0* 9.8* 8.8* 9.5* 9.4*  HCT 29.3* 32.2* 29.1* 30.6* 30.9*  MCV 89.6 89.9 88.4 88.2 88.0  PLT 271 321 269 322 318   Cardiac Enzymes: No results for input(s): CKTOTAL, CKMB, CKMBINDEX, TROPONINI in the last 168 hours. BNP: Invalid input(s): POCBNP CBG: No results for input(s): GLUCAP in the last 168 hours. D-Dimer No results for input(s): DDIMER in the last 72 hours. Hgb A1c No results for input(s): HGBA1C in the last 72 hours. Lipid Profile No results for input(s): CHOL, HDL, LDLCALC, TRIG, CHOLHDL, LDLDIRECT in the last 72 hours. Thyroid function studies No results for input(s): TSH, T4TOTAL, T3FREE, THYROIDAB in the last 72 hours.  Invalid input(s): FREET3 Anemia work up No results for input(s): VITAMINB12, FOLATE, FERRITIN, TIBC, IRON, RETICCTPCT in the last 72 hours. Urinalysis    Component Value Date/Time   COLORURINE YELLOW 12/04/2020 0247   APPEARANCEUR CLEAR 12/04/2020 0247   LABSPEC 1.012 12/04/2020 0247   PHURINE 5.0 12/04/2020 0247   GLUCOSEU NEGATIVE 12/04/2020 0247   HGBUR MODERATE (A) 12/04/2020 0247   BILIRUBINUR NEGATIVE 12/04/2020 0247   KETONESUR NEGATIVE 12/04/2020 0247   PROTEINUR 30 (A) 12/04/2020 0247   UROBILINOGEN 0.2 03/11/2013 1135   NITRITE NEGATIVE 12/04/2020 0247   LEUKOCYTESUR NEGATIVE 12/04/2020 0247   Sepsis Labs  Invalid input(s): PROCALCITONIN,  WBC,  LACTICIDVEN Microbiology Recent Results (from the past 240 hour(s))  Resp Panel by RT-PCR (Flu A&B, Covid) Nasopharyngeal Swab     Status: None   Collection Time: 12/18/20  5:25 AM   Specimen: Nasopharyngeal Swab; Nasopharyngeal(NP) swabs in vial transport medium  Result Value Ref Range Status   SARS Coronavirus 2 by RT PCR NEGATIVE NEGATIVE Final    Comment: (NOTE) SARS-CoV-2 target nucleic acids are NOT DETECTED.  The SARS-CoV-2 RNA is generally detectable in  upper respiratory specimens during the acute phase of infection. The lowest concentration of SARS-CoV-2 viral copies this assay can detect is 138 copies/mL. A negative result does not preclude SARS-Cov-2 infection and should not be used as the sole basis for treatment or other patient management decisions. A negative result may occur with  improper specimen collection/handling, submission of specimen other than nasopharyngeal swab, presence of viral mutation(s) within the areas targeted by this assay, and inadequate number of viral copies(<138 copies/mL). A negative result must be combined with clinical observations, patient history, and epidemiological information. The expected result is Negative.  Fact Sheet for Patients:  BloggerCourse.com  Fact Sheet for Healthcare Providers:  SeriousBroker.it  This test is no t yet approved or cleared by the [place] FDA and  has been authorized for detection and/or diagnosis of SARS-CoV-2 by FDA under an Emergency Use Authorization (EUA). This EUA will remain  in effect (meaning this test can be used) for the duration of the COVID-19 declaration under Section 564(b)(1) of the Act, 21 U.S.C.section 360bbb-3(b)(1), unless the authorization is terminated  or revoked sooner.       Influenza A by PCR NEGATIVE NEGATIVE Final   Influenza B by PCR NEGATIVE NEGATIVE Final    Comment: (NOTE) The Xpert Xpress SARS-CoV-2/FLU/RSV plus assay is intended as an aid in the diagnosis of influenza from Nasopharyngeal swab specimens and should not be used as a sole basis for treatment. Nasal washings and aspirates are unacceptable for Xpert Xpress SARS-CoV-2/FLU/RSV testing.  Fact Sheet for Patients: BloggerCourse.com  Fact Sheet for Healthcare Providers: SeriousBroker.it  This test is not yet approved or cleared by the [place] FDA and has been  authorized for detection and/or diagnosis of SARS-CoV-2 by FDA under an Emergency Use Authorization (EUA). This EUA will remain in effect (meaning this test can be used) for the duration of the COVID-19 declaration under Section 564(b)(1) of the Act, 21 U.S.C. section 360bbb-3(b)(1), unless the authorization is terminated or revoked.  Performed at [facility], [address], [place], [place] [address]   MRSA PCR Screening     Status: None   Collection Time: 12/18/20  8:21 AM   Specimen: Nasopharyngeal  Result Value Ref Range Status   MRSA by PCR NEGATIVE NEGATIVE Final    Comment:        The GeneXpert MRSA Assay (FDA approved for NASAL specimens only), is one component of a comprehensive MRSA colonization surveillance program. It is not intended to diagnose MRSA infection nor to guide or monitor treatment for MRSA infections. Performed at [facility], [address], [place], [place] [address]      Time coordinating discharge: 25 minutes  SIGNED: Lanae Boast, MD  [facility] 12/21/2020, 9:51 AM  If 7PM-7AM, please contact night-coverage [email or web address]

## 2020-12-22 ENCOUNTER — Telehealth: Payer: No Typology Code available for payment source | Admitting: Cardiovascular Disease

## 2020-12-23 ENCOUNTER — Telehealth: Payer: Self-pay | Admitting: Hematology and Oncology

## 2020-12-23 ENCOUNTER — Ambulatory Visit (INDEPENDENT_AMBULATORY_CARE_PROVIDER_SITE_OTHER): Payer: 59

## 2020-12-23 ENCOUNTER — Other Ambulatory Visit: Payer: Self-pay

## 2020-12-23 DIAGNOSIS — Z5181 Encounter for therapeutic drug level monitoring: Secondary | ICD-10-CM

## 2020-12-23 DIAGNOSIS — Z954 Presence of other heart-valve replacement: Secondary | ICD-10-CM | POA: Diagnosis not present

## 2020-12-23 LAB — POCT INR: INR: 2.8 (ref 2.0–3.0)

## 2020-12-23 NOTE — Telephone Encounter (Signed)
Pt cld to r/s his new hem appt w/Dr. Pamelia Hoit to 3/23 at 345pm. Pt aware to arrive 30 minutes early.

## 2020-12-23 NOTE — Patient Instructions (Signed)
Continue taking 1.5 tablets every day except 1 tablet every Tuesday, Thursday and Saturday  Next INR in 6 weeks.

## 2020-12-24 ENCOUNTER — Ambulatory Visit: Payer: No Typology Code available for payment source | Admitting: Cardiovascular Disease

## 2020-12-24 NOTE — Progress Notes (Signed)
Cardiology Clinic Note   Patient Name: Melvin Burns Date of Encounter: 12/25/2020  Primary Care Provider:  Chilton Siandolph, Tiffany, MD Primary Cardiologist:  Chilton Siiffany [place], MD  Patient Profile    Melvin MassingMohamadou M. Tadros 55 year old male presents to the clinic today for follow-up evaluation of his essential hypertension, CHF, and paroxysmal atrial fibrillation.  Past Medical History    Past Medical History:  Diagnosis Date  . Hypertension   . Incidental pulmonary nodule 01/08/2020   Multiple in right lung - need f/u CT 1 year due to h/o smoking  . Mild aortic stenosis   . Mitral valve stenosis, severe   . Moderate mitral regurgitation   . Pneumonia    x 1  . Rheumatic mitral stenosis with regurgitation    Echo 08/08/16:  Severe mitral stenosis (mean gradient 17 mmHg).  Moderate mitral regurgitation.  . 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 Wears glasses   . Wears partial dentures    upper   Past Surgical History:  Procedure Laterality Date  . BILATERAL KNEE ARTHROSCOPY    . BUBBLE STUDY  12/16/2019   Procedure: BUBBLE STUDY;  Surgeon: Parke PoissonAcharya, Gayatri A, MD;  [facility];  Service: Cardiology;;  . MITRAL VALVE REPLACEMENT Right 03/06/2020   Procedure: MINIMALLY INVASIVE MITRAL VALVE (MV) REPLACEMENT using Carbomedics Optiform 33 MM Mitral Bioprosthetic Valve.;  Surgeon: Purcell Nailswen, Clarence H, MD;  Location: [facility] OR;  Service: Open Heart Surgery;  Laterality: Right;  . MULTIPLE EXTRACTIONS WITH ALVEOLOPLASTY N/A 12/31/2019   Procedure: Extraction of tooth #'s 16-17, 20-23, and 28-30 with alveoloplasty, Bilateral mandibular tori reductions, and lateral exostoses reductions.;  Surgeon: Charlynne PanderKulinski, Ronald F, DDS;  Location: [facility] OR;  Service: Oral Surgery;  Laterality: N/A;  . RIGHT/LEFT HEART CATH AND CORONARY ANGIOGRAPHY N/A 12/16/2019    Procedure: RIGHT/LEFT HEART CATH AND CORONARY ANGIOGRAPHY;  Surgeon: Yvonne KendallEnd, Christopher, MD;  [facility]: [facility] INVASIVE CV LAB;  Service: Cardiovascular;  Laterality: N/A;  . TEE WITHOUT CARDIOVERSION N/A 12/16/2019   Procedure: TRANSESOPHAGEAL ECHOCARDIOGRAM (TEE);  Surgeon: Parke PoissonAcharya, Gayatri A, MD;  [facility];  Service: Cardiology;  Laterality: N/A;  . TEE WITHOUT CARDIOVERSION N/A 03/06/2020   Procedure: TRANSESOPHAGEAL ECHOCARDIOGRAM (TEE);  Surgeon: Purcell Nailswen, Clarence H, MD;  [facility];  Service: Open Heart Surgery;  Laterality: N/A;    Allergies  No Known Allergies  History of Present Illness    Mr.Melvin Burns has a PMH of hypertension, rheumatic heart disease status post mechanical valve, paroxysmal atrial fibrillation on Coumadin, tobacco abuse, hypoxic respiratory CHF exacerbation, and COVID-19 infection.  He was managed with IV diuresis, remdesivir, and a short course of steroids.  He was seen in the emergency department on 12/17/2020 with similar presentation.  He was deemed stable and discharged.  He was instructed to follow-up with cardiology.  He returned to the emergency department on 12/18/2020 with worsening shortness of breath and was noted to be hypoxic with an O2 saturation of 84% on room air and tachypnea.  He was afebrile, tachycardic, tachypneic, hypertensive, and hypoxic.  He was placed on 6 L nasal cannula.  His CMP was unremarkable, BNP 128, troponins 14-15, WBC 18, hemoglobin 9.8, chest x-ray showed mild atypical/viral infection or interstitial edema which was unchanged.  He received furosemide 40 mg IV x1, aspirin, nitro gtt.  He felt significantly better after receiving IV furosemide.  His symptoms eventually resolved and he was weaned  off oxygen and ambulating well.  He was given instructions for low-sodium diet.  His furosemide 40 mg twice daily dosing was continued.  Due to his leukocytosis he was referred to oncology.  He presents to the clinic today for follow-up  evaluation states he is feeling well today.  He reports that he is compliant with his furosemide.  He has been taking it in the morning and in the evening.  We discussed why he is taking it in the evening and he said he is somewhat concerned because his episodes of shortness of breath happen at night.  We discussed obstructive sleep apnea and he denies snoring, PND, and somnolence.  He reports that he is slowly increasing his physical activity and has been riding stationary bicycle at the gym.  I will decrease his furosemide to daily, give him salty 6 diet sheet, have him increase his physical activity as tolerated, and follow-up in 3 months.  Today he denies chest pain, shortness of breath, lower extremity edema, fatigue, palpitations, melena, hematuria, hemoptysis, diaphoresis, weakness, presyncope, syncope, orthopnea, and PND.   Home Medications    Prior to Admission medications   Medication Sig Start Date End Date Taking? Authorizing Provider  aspirin EC 81 MG tablet Take 1 tablet (81 mg total) by mouth daily. 12/16/19   End, Cristal Deer, MD  Evolocumab (REPATHA SURECLICK) 140 MG/ML SOAJ Inject 140 mg into the skin every 14 (fourteen) days. Patient not taking: No sig reported 10/08/20   Chilton Si, MD  furosemide (LASIX) 20 MG tablet Take 2 tablets (40 mg total) by mouth daily for 30 doses. Take 40 mg BID for 1 week then 40 mg daily 12/21/20 01/20/21  Lanae Boast, MD  lisinopril (ZESTRIL) 10 MG tablet Take 1 tablet (10 mg total) by mouth daily - in the evenings 12/07/20   Briant Cedar, MD  nebivolol (BYSTOLIC) 5 MG tablet Take 1 tablet (5 mg total) by mouth daily. Patient not taking: Reported on 12/18/2020 07/01/20   Chilton Si, MD  warfarin (COUMADIN) 5 MG tablet Take 1 tablet (5 mg total) by mouth daily at 4 PM. Patient taking differently: Take 5-7.5 mg by mouth See admin instructions. Take 1 tablet by mouth on Tues, Thurs, and Sat. All other days take 1 & 1/2 tablet. 07/28/20    Chilton Si, MD    Family History    Family History  Problem Relation Age of Onset  . Hypertension Mother   . Hypertension Father   . Stroke Father    He indicated that his mother is deceased. He indicated that his father is deceased. He indicated that his maternal grandmother is deceased. He indicated that his maternal grandfather is deceased. He indicated that his paternal grandmother is deceased. He indicated that his paternal grandfather is deceased.  Social History    Social History   Socioeconomic History  . Marital status: Single    Spouse name: Not on file  . Number of children: Not on file  . Years of education: Not on file  . Highest education level: Not on file  Occupational History  . Occupation: [employer] account executive  Tobacco Use  . Smoking status: Former Smoker    Packs/day: 1.00    Years: 35.00    Pack years: 35.00    Types: Cigarettes    Quit date: 12/09/2019    Years since quitting: 1.0  . Smokeless tobacco: Never Used  Vaping Use  . Vaping Use: Never used  Substance and Sexual Activity  .  Alcohol use: No  . Drug use: No  . Sexual activity: Yes    Partners: Female  Other Topics Concern  . Not on file  Social History Narrative   Pt is married.  He is a former Database administrator.             Social Determinants of Health   Financial Resource Strain: Not on file  Food Insecurity: Not on file  Transportation Needs: Not on file  Physical Activity: Not on file  Stress: Not on file  Social Connections: Not on file  Intimate Partner Violence: Not on file     Review of Systems    General:  No chills, fever, night sweats or weight changes.  Cardiovascular:  No chest pain, dyspnea on exertion, edema, orthopnea, palpitations, paroxysmal nocturnal dyspnea. Dermatological: No rash, lesions/masses Respiratory: No cough, dyspnea Urologic: No hematuria, dysuria Abdominal:   No nausea, vomiting, diarrhea, bright red blood per rectum, melena, or  hematemesis Neurologic:  No visual changes, wkns, changes in mental status. All other systems reviewed and are otherwise negative except as noted above.  Physical Exam    VS:  BP 100/60 (BP Location: Left Arm, Patient Position: Sitting, Cuff Size: Normal)   Pulse 78   Ht 5\' 9"  (1.753 m)   Wt 171 lb (77.6 kg)   SpO2 96%   BMI 25.25 kg/m  , BMI Body mass index is 25.25 kg/m. GEN: Well nourished, well developed, in no acute distress. HEENT: normal. Neck: Supple, no JVD, carotid bruits, or masses. Cardiac: RRR, no murmurs, rubs, or gallops. No clubbing, cyanosis, edema.  Radials/DP/PT 2+ and equal bilaterally.  Respiratory:  Respirations regular and unlabored, clear to auscultation bilaterally. GI: Soft, nontender, nondistended, BS + x 4. MS: no deformity or atrophy. Skin: warm and dry, no rash. Neuro:  Strength and sensation are intact. Psych: Normal affect.  Accessory Clinical Findings    Recent Labs: 12/18/2020: ALT 14; B Natriuretic Peptide 128.3; Magnesium 1.9 12/21/2020: BUN 16; Creatinine, Ser 1.00; Hemoglobin 9.4; Platelets 318; Potassium 3.6; Sodium 138   Recent Lipid Panel    Component Value Date/Time   CHOL 157 06/01/2016 1756   TRIG 130 12/04/2020 0617   HDL 47 06/01/2016 1756   CHOLHDL 3.3 06/01/2016 1756   VLDL 26 06/01/2016 1756   LDLCALC 84 06/01/2016 1756    ECG personally reviewed by me today-none today.    Echocardiogram 12/04/2020 IMPRESSIONS    1. Left ventricular ejection fraction, by estimation, is 60 to 65%. The  left ventricle has normal function. The left ventricle has no regional  wall motion abnormalities. There is mild left ventricular hypertrophy.  Left ventricular diastolic parameters  are indeterminate.  2. Right ventricular systolic function is normal. The right ventricular  size is normal.  3. Left atrial size was mildly dilated.  4. The mitral valve has been repaired/replaced. No evidence of mitral  valve regurgitation. No  evidence of mitral stenosis. There is a 33 mm  Sorin CarboMedics mechanical valve present in the mitral position.  Procedure Date: 03/06/2020. Echo findings are  consistent with normal structure and function of the mitral valve  prosthesis.  5. The aortic valve is normal in structure. Aortic valve regurgitation is  moderate. No aortic stenosis is present.  6. The inferior vena cava is normal in size with greater than 50%  respiratory variability, suggesting right atrial pressure of 3 mmHg.  Assessment & Plan   1.  Acute on chronic diastolic CHF/acute respiratory failure with hypoxia -  no increased DOE or activity intolerance.  Euvolemic today.  Recently treated for COVID-19 infection with steroids, remdesivir, and IV diuresis.  Echocardiogram 12/04/2020 showed normal LVEF intermediate diastolic parameters, mildly dilated left atria, moderate aortic valve regurgitation and no pericardial effusion. Continue furosemide, metoprolol Heart healthy low-sodium diet-salty 6 given Increase physical activity as tolerated Daily weights contact office with a weight increase of 3 pounds overnight or 5 pounds in 1 week. Repeat BMP  Essential hypertension-BP today 100/60.  Well-controlled at home. Continue lisinopril, nebivolol Heart healthy low-sodium diet-salty 6 given Increase physical activity as tolerated  Rheumatic heart disease status post minimally invasive mitral valve replacement/paroxysmal atrial fibrillation -mechanical valve on Coumadin.  Recent echocardiogram showed normally functioning mitral valve with normal structure/position.  Heart rate today 78 bpm Continue warfarin, nebivolol, aspirin Heart healthy low-sodium diet-salty 6 given Increase physical activity as tolerated   Disposition: Follow-up with Dr. Duke Salvia in 3 months.  Thomasene Ripple. Mayana Irigoyen NP-C    12/25/2020, 10:55 AM [facility] [address] Office [phone] Fax [phone]  Notice: This dictation was prepared with Dragon dictation along with smaller phrase technology. Any transcriptional errors that result from this process are unintentional and may not be corrected upon review.  I spent 12 minutes examining this patient, reviewing medications, and using patient centered shared decision making involving her cardiac care.  Prior to her visit I spent greater than 20 minutes reviewing her past medical history,  medications, and prior cardiac tests.

## 2020-12-25 ENCOUNTER — Other Ambulatory Visit: Payer: Self-pay

## 2020-12-25 ENCOUNTER — Ambulatory Visit (INDEPENDENT_AMBULATORY_CARE_PROVIDER_SITE_OTHER): Payer: 59 | Admitting: General Practice

## 2020-12-25 ENCOUNTER — Encounter: Payer: Self-pay | Admitting: General Practice

## 2020-12-25 ENCOUNTER — Ambulatory Visit: Payer: 59 | Admitting: General Practice

## 2020-12-25 VITALS — BP 100/60 | HR 78 | Ht 69.0 in | Wt 171.0 lb

## 2020-12-25 DIAGNOSIS — I5043 Acute on chronic combined systolic (congestive) and diastolic (congestive) heart failure: Secondary | ICD-10-CM | POA: Diagnosis not present

## 2020-12-25 DIAGNOSIS — J9601 Acute respiratory failure with hypoxia: Secondary | ICD-10-CM | POA: Diagnosis not present

## 2020-12-25 DIAGNOSIS — I10 Essential (primary) hypertension: Secondary | ICD-10-CM

## 2020-12-25 DIAGNOSIS — I480 Paroxysmal atrial fibrillation: Secondary | ICD-10-CM

## 2020-12-25 DIAGNOSIS — I052 Rheumatic mitral stenosis with insufficiency: Secondary | ICD-10-CM | POA: Diagnosis not present

## 2020-12-25 DIAGNOSIS — E782 Mixed hyperlipidemia: Secondary | ICD-10-CM

## 2020-12-25 DIAGNOSIS — Z79899 Other long term (current) drug therapy: Secondary | ICD-10-CM

## 2020-12-25 MED ORDER — FUROSEMIDE 40 MG PO TABS: 40.0000 mg | ORAL_TABLET | Freq: Every day | ORAL | 0 refills | Status: DC

## 2020-12-25 NOTE — Patient Instructions (Signed)
Medication Instructions:  TAKE LASIX 40MG  DAILY *If you need a refill on your cardiac medications before your next appointment, please call your pharmacy*  Lab Work:    BMET TODAY      Special Instructions TAKE AND LOG YOUR WEIGHT FIRST THING IN THE AM  PLEASE READ AND FOLLOW SALTY 6-ATTACHED-1,800mg  daily  PLEASE INCREASE PHYSICAL ACTIVITY AS TOLERATED  MAY RETURN TO WORK WITH NO RESTRICTIONS-Monday 3-21  Follow-Up: Your next appointment:  3 month(s) In Person with , MD ONLY   At [facility], you and your health needs are our priority.  As part of our continuing mission to provide you with exceptional heart care, we have created designated Provider Care Teams.  These Care Teams include your primary Cardiologist (physician) and Advanced Practice Providers (APPs -  Physician Assistants and Nurse Practitioners) who all work together to provide you with the care you need, when you need it.            6 SALTY THINGS TO AVOID     1,800MG  DAILY

## 2020-12-26 LAB — BASIC METABOLIC PANEL
BUN/Creatinine Ratio: 12 (ref 9–20)
BUN: 11 mg/dL (ref 6–24)
CO2: 22 mmol/L (ref 20–29)
Calcium: 9.3 mg/dL (ref 8.7–10.2)
Chloride: 104 mmol/L (ref 96–106)
Creatinine, Ser: 0.92 mg/dL (ref 0.76–1.27)
Glucose: 82 mg/dL (ref 65–99)
Potassium: 4 mmol/L (ref 3.5–5.2)
Sodium: 143 mmol/L (ref 134–144)
eGFR: 98 mL/min/{1.73_m2} (ref >59)

## 2020-12-28 ENCOUNTER — Inpatient Hospital Stay: Payer: 59 | Admitting: Hematology and Oncology

## 2020-12-29 NOTE — Progress Notes (Signed)
[place] NOTE  Patient Care Team: Skeet Latch, MD as PCP - General (Cardiology) Skeet Latch, MD as PCP - Cardiology (Cardiology)  CHIEF COMPLAINTS/PURPOSE OF CONSULTATION:  Newly diagnosed leukocytosis  HISTORY OF PRESENTING ILLNESS:  Melvin Burns 55 y.o. male is here because of recent diagnosis of leukocytosis. He was admitted from 12/04/20-12/07/20 for a COVID-19 infection. He was again admitted from 12/18/20-12/21/20 for respiratory failure and heart failure and found to have chronic leukocytosis. Labs on 12/11/20 showed: WBC 13.6, Hg 9.4, HCT 30.9, platelets 318. He presents to the clinic today for initial evaluation.   I reviewed his records extensively and collaborated the history with the patient.  MEDICAL HISTORY:  Past Medical History:  Diagnosis Date  . Hypertension   . Incidental pulmonary nodule 01/08/2020   Multiple in right lung - need f/u CT 1 year due to h/o smoking  . Mild aortic stenosis   . Mitral valve stenosis, severe   . Moderate mitral regurgitation   . Pneumonia    x 1  . Rheumatic mitral stenosis with regurgitation    Echo 08/08/16:  Severe mitral stenosis (mean gradient 17 mmHg).  Moderate mitral regurgitation.  . 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 Wears glasses   . Wears partial dentures    upper    SURGICAL HISTORY: Past Surgical History:  Procedure Laterality Date  . BILATERAL KNEE ARTHROSCOPY    . BUBBLE STUDY  12/16/2019   Procedure: BUBBLE STUDY;  Surgeon: Elouise Munroe, MD;  Location: [place];  Service: Cardiology;;  . MITRAL VALVE REPLACEMENT Right 03/06/2020   Procedure: MINIMALLY INVASIVE MITRAL VALVE (MV) REPLACEMENT using Carbomedics Optiform 52 MM Mitral Bioprosthetic Valve.;  Surgeon: Rexene Alberts, MD;  Location: [place];  Service: Open Heart  Surgery;  Laterality: Right;  . MULTIPLE EXTRACTIONS WITH ALVEOLOPLASTY N/A 12/31/2019   Procedure: Extraction of tooth #'s 16-17, 20-23, and 28-30 with alveoloplasty, Bilateral mandibular tori reductions, and lateral exostoses reductions.;  Surgeon: [name], DDS;  Location: [place];  Service: Oral Surgery;  Laterality: N/A;  . RIGHT/LEFT HEART CATH AND CORONARY ANGIOGRAPHY N/A 12/16/2019   Procedure: RIGHT/LEFT HEART CATH AND CORONARY ANGIOGRAPHY;  Surgeon: Nelva Bush, MD;  Location: [place] CV LAB;  Service: Cardiovascular;  Laterality: N/A;  . TEE WITHOUT CARDIOVERSION N/A 12/16/2019   Procedure: TRANSESOPHAGEAL ECHOCARDIOGRAM (TEE);  Surgeon: Elouise Munroe, MD;  Location: [place];  Service: Cardiology;  Laterality: N/A;  . TEE WITHOUT CARDIOVERSION N/A 03/06/2020   Procedure: TRANSESOPHAGEAL ECHOCARDIOGRAM (TEE);  Surgeon: Rexene Alberts, MD;  Location: [place];  Service: Open Heart Surgery;  Laterality: N/A;    SOCIAL HISTORY: Social History   Socioeconomic History  . Marital status: Single    Spouse name: Not on file  . Number of children: Not on file  . Years of education: Not on file  . Highest education level: Not on file  Occupational History  . Occupation: [employer] manager  Tobacco Use  . Smoking status: Former Smoker    Packs/day: 1.00    Years: 35.00    Pack years: 35.00    Types: Cigarettes    Quit date: 12/09/2019    Years since quitting: 1.0  . Smokeless tobacco: Never Used  Vaping Use  . Vaping Use: Never used  Substance and Sexual Activity  . Alcohol use: No  . Drug  use: No  . Sexual activity: Yes    Partners: Female  Other Topics Concern  . Not on file  Social History Narrative   Pt is married.  He is a former Theme park manager.             Social Determinants of Health   Financial Resource Strain: Not on file  Food Insecurity: Not on file  Transportation Needs: Not on file  Physical Activity: Not on file  Stress: Not on file   Social Connections: Not on file  Intimate Partner Violence: Not on file    FAMILY HISTORY: Family History  Problem Relation Age of Onset  . Hypertension Mother   . Hypertension Father   . Stroke Father     ALLERGIES:  has No Known Allergies.  MEDICATIONS:  Current Outpatient Medications  Medication Sig Dispense Refill  . aspirin EC 81 MG tablet Take 1 tablet (81 mg total) by mouth daily.    . furosemide (LASIX) 40 MG tablet Take 1 tablet (40 mg total) by mouth daily for 30 doses. 30 tablet 0  . lisinopril (ZESTRIL) 10 MG tablet Take 1 tablet (10 mg total) by mouth daily - in the evenings 45 tablet 1  . nebivolol (BYSTOLIC) 5 MG tablet Take 1 tablet (5 mg total) by mouth daily. 90 tablet 1  . warfarin (COUMADIN) 5 MG tablet Take 1 tablet (5 mg total) by mouth daily at 4 PM. (Patient taking differently: Take 5-7.5 mg by mouth See admin instructions. Take 1 tablet by mouth on Tues, Thurs, and Sat. All other days take 1 & 1/2 tablet.) 30 tablet 1   No current facility-administered medications for this visit.    REVIEW OF SYSTEMS:   Constitutional: Denies fevers, chills or abnormal night sweats Eyes: Denies blurriness of vision, double vision or watery eyes Ears, nose, mouth, throat, and face: Denies mucositis or sore throat Respiratory: Denies cough, dyspnea or wheezes Cardiovascular: Denies palpitation, chest discomfort or lower extremity swelling Gastrointestinal:  Denies nausea, heartburn or change in bowel habits Skin: Denies abnormal skin rashes Lymphatics: Denies new lymphadenopathy or easy bruising Neurological:Denies numbness, tingling or new weaknesses Behavioral/Psych: Mood is stable, no new changes  All other systems were reviewed with the patient and are negative.  PHYSICAL EXAMINATION: ECOG PERFORMANCE STATUS: 1 - Symptomatic but completely ambulatory  Vitals:   12/30/20 1554  BP: 114/62  Pulse: 87  Resp: 20  Temp: 97.9 F (36.6 C)  SpO2: 100%   Filed  Weights   12/30/20 1554  Weight: 172 lb (78 kg)    GENERAL:alert, no distress and comfortable SKIN: skin color, texture, turgor are normal, no rashes or significant lesions EYES: normal, conjunctiva are pink and non-injected, sclera clear OROPHARYNX:no exudate, no erythema and lips, buccal mucosa, and tongue normal  NECK: supple, thyroid normal size, non-tender, without nodularity LYMPH:  no palpable lymphadenopathy in the cervical, axillary or inguinal LUNGS: clear to auscultation and percussion with normal breathing effort HEART: regular rate & rhythm and no murmurs and no lower extremity edema ABDOMEN:abdomen soft, non-tender and normal bowel sounds Musculoskeletal:no cyanosis of digits and no clubbing  PSYCH: alert & oriented x 3 with fluent speech NEURO: no focal motor/sensory deficits  LABORATORY DATA:  I have reviewed the data as listed Lab Results  Component Value Date   WBC 13.4 (H) 12/30/2020   HGB 7.4 (L) 12/30/2020   HCT 24.5 (L) 12/30/2020   MCV 86.3 12/30/2020   PLT 335 12/30/2020   Lab Results  Component Value Date   NA 140 12/30/2020   K 3.4 (L) 12/30/2020   CL 104 12/30/2020   CO2 23 12/30/2020    RADIOGRAPHIC STUDIES: I have personally reviewed the radiological reports and agreed with the findings in the report.  ASSESSMENT AND PLAN:  Leukocytosis Hospitalizations 12/04/20-12/07/20: Resp failure due to CHF and COVID positive (required steroids and Remdesivir) 12/18/20- 12/21/20: CHF exacerbation  Differential diagnosis of chronic leukocytosis (Primarily elevated neutrophils)  1. Inflammations: The only probable inflammation is lungs 2. infections: COVID 19 3. Medications: Was on steroids 4. Bone marrow disorders: unlikely (Flow cytometry 12/18/20: Negative)  Based on negative flow results, no further work up is needed.  Anemia: Normocytic anemia Today's hemoglobin is 7.4 with an MCV of 86.3 (declined from 9.4) Differential diagnosis: Anemia due to  inflammation, anemia due to chronic disease, combined P02 or folic acid deficiencies, hemolysis, bone marrow dysfunction Plan: We will obtain CBC with differential, iron studies, H61, folic acid, SPEP, hemolysis work-up I will call him next week with the results of the tests. If we cannot figure out the cause of his anemia then we may have to do a bone marrow biopsy.    All questions were answered. The patient knows to call the clinic with any problems, questions or concerns.   Rulon Eisenmenger, MD, MPH 12/30/2020    I, Molly Dorshimer, am acting as scribe for Nicholas Lose, MD.  I have reviewed the above documentation for accuracy and completeness, and I agree with the above.

## 2020-12-30 ENCOUNTER — Other Ambulatory Visit: Payer: Self-pay

## 2020-12-30 ENCOUNTER — Inpatient Hospital Stay: Payer: 59 | Attending: Hematology and Oncology | Admitting: Hematology and Oncology

## 2020-12-30 ENCOUNTER — Inpatient Hospital Stay: Payer: 59

## 2020-12-30 DIAGNOSIS — D649 Anemia, unspecified: Secondary | ICD-10-CM | POA: Diagnosis not present

## 2020-12-30 DIAGNOSIS — D72829 Elevated white blood cell count, unspecified: Secondary | ICD-10-CM

## 2020-12-30 LAB — VITAMIN B12: Vitamin B-12: 346 pg/mL (ref 180–914)

## 2020-12-30 LAB — CBC WITH DIFFERENTIAL (CANCER CENTER ONLY)
Abs Immature Granulocytes: 0.08 10*3/uL — ABNORMAL HIGH (ref 0.00–0.07)
Basophils Absolute: 0.1 10*3/uL (ref 0.0–0.1)
Basophils Relative: 1 %
Eosinophils Absolute: 0.2 10*3/uL (ref 0.0–0.5)
Eosinophils Relative: 2 %
HCT: 24.5 % — ABNORMAL LOW (ref 39.0–52.0)
Hemoglobin: 7.4 g/dL — ABNORMAL LOW (ref 13.0–17.0)
Immature Granulocytes: 1 %
Lymphocytes Relative: 17 %
Lymphs Abs: 2.3 10*3/uL (ref 0.7–4.0)
MCH: 26.1 pg (ref 26.0–34.0)
MCHC: 30.2 g/dL (ref 30.0–36.0)
MCV: 86.3 fL (ref 80.0–100.0)
Monocytes Absolute: 1.8 10*3/uL — ABNORMAL HIGH (ref 0.1–1.0)
Monocytes Relative: 13 %
Neutro Abs: 8.9 10*3/uL — ABNORMAL HIGH (ref 1.7–7.7)
Neutrophils Relative %: 66 %
Platelet Count: 335 10*3/uL (ref 150–400)
RBC: 2.84 MIL/uL — ABNORMAL LOW (ref 4.22–5.81)
RDW: 20 % — ABNORMAL HIGH (ref 11.5–15.5)
WBC Count: 13.4 10*3/uL — ABNORMAL HIGH (ref 4.0–10.5)
nRBC: 0.1 % (ref 0.0–0.2)

## 2020-12-30 LAB — CMP (CANCER CENTER ONLY)
ALT: 16 U/L (ref 0–44)
AST: 40 U/L (ref 15–41)
Albumin: 3.7 g/dL (ref 3.5–5.0)
Alkaline Phosphatase: 67 U/L (ref 38–126)
Anion gap: 13 (ref 5–15)
BUN: 15 mg/dL (ref 6–20)
CO2: 23 mmol/L (ref 22–32)
Calcium: 9.4 mg/dL (ref 8.9–10.3)
Chloride: 104 mmol/L (ref 98–111)
Creatinine: 1.03 mg/dL (ref 0.61–1.24)
GFR, Estimated: >60 mL/min (ref >60)
Glucose, Bld: 136 mg/dL — ABNORMAL HIGH (ref 70–99)
Potassium: 3.4 mmol/L — ABNORMAL LOW (ref 3.5–5.1)
Sodium: 140 mmol/L (ref 135–145)
Total Bilirubin: 1.2 mg/dL (ref 0.3–1.2)
Total Protein: 7.8 g/dL (ref 6.5–8.1)

## 2020-12-30 LAB — DIRECT ANTIGLOBULIN TEST (NOT AT ARMC)
DAT, IgG: NEGATIVE
DAT, complement: NEGATIVE

## 2020-12-30 LAB — FOLATE: Folate: 9.5 ng/mL (ref >5.9)

## 2020-12-30 NOTE — Assessment & Plan Note (Signed)
Hospitalizations 12/04/20-12/07/20: Resp failure due to CHF and COVID positive (required steroids and Remdesivir) 12/18/20- 12/21/20: CHF exacerbation  Differential diagnosis of chronic leukocytosis (Primarily elevated neutrophils)  1. Inflammations: The only probable inflammation is lungs 2. infections: COVID 19 3. Medications: Was on steroids 4. Bone marrow disorders: unlikely (Flow cytometry 12/18/20: Negative)  Based on negative flow results, no further work up is needed.  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

## 2020-12-31 LAB — IRON AND TIBC
Iron: 38 ug/dL — ABNORMAL LOW (ref 42–163)
Saturation Ratios: 11 % — ABNORMAL LOW (ref 20–55)
TIBC: 343 ug/dL (ref 202–409)
UIBC: 305 ug/dL (ref 117–376)

## 2020-12-31 LAB — FERRITIN: Ferritin: 123 ng/mL (ref 24–336)

## 2021-01-01 LAB — MULTIPLE MYELOMA PANEL, SERUM
Albumin SerPl Elph-Mcnc: 3.5 g/dL (ref 2.9–4.4)
Albumin/Glob SerPl: 1 (ref 0.7–1.7)
Alpha 1: 0.3 g/dL (ref 0.0–0.4)
Alpha2 Glob SerPl Elph-Mcnc: 0.6 g/dL (ref 0.4–1.0)
B-Globulin SerPl Elph-Mcnc: 1.2 g/dL (ref 0.7–1.3)
Gamma Glob SerPl Elph-Mcnc: 1.5 g/dL (ref 0.4–1.8)
Globulin, Total: 3.7 g/dL (ref 2.2–3.9)
IgA: 419 mg/dL — ABNORMAL HIGH (ref 90–386)
IgG (Immunoglobin G), Serum: 1494 mg/dL (ref 603–1613)
IgM (Immunoglobulin M), Srm: 41 mg/dL (ref 20–172)
Total Protein ELP: 7.2 g/dL (ref 6.0–8.5)

## 2021-01-03 ENCOUNTER — Inpatient Hospital Stay (HOSPITAL_COMMUNITY)
Admission: EM | Admit: 2021-01-03 | Discharge: 2021-01-08 | DRG: 291 | Disposition: A | Discharge status: Home / Self Care | Payer: 59 | Attending: Student | Admitting: Student

## 2021-01-03 ENCOUNTER — Encounter (HOSPITAL_COMMUNITY): Payer: Self-pay

## 2021-01-03 ENCOUNTER — Emergency Department (HOSPITAL_COMMUNITY): Payer: 59

## 2021-01-03 ENCOUNTER — Other Ambulatory Visit: Payer: Self-pay

## 2021-01-03 DIAGNOSIS — I10 Essential (primary) hypertension: Secondary | ICD-10-CM | POA: Diagnosis present

## 2021-01-03 DIAGNOSIS — D509 Iron deficiency anemia, unspecified: Secondary | ICD-10-CM | POA: Diagnosis present

## 2021-01-03 DIAGNOSIS — Z87891 Personal history of nicotine dependence: Secondary | ICD-10-CM

## 2021-01-03 DIAGNOSIS — I110 Hypertensive heart disease with heart failure: Secondary | ICD-10-CM | POA: Diagnosis not present

## 2021-01-03 DIAGNOSIS — J9601 Acute respiratory failure with hypoxia: Secondary | ICD-10-CM | POA: Diagnosis not present

## 2021-01-03 DIAGNOSIS — D594 Other nonautoimmune hemolytic anemias: Secondary | ICD-10-CM | POA: Diagnosis present

## 2021-01-03 DIAGNOSIS — R7402 Elevation of levels of lactic acid dehydrogenase (LDH): Secondary | ICD-10-CM | POA: Diagnosis present

## 2021-01-03 DIAGNOSIS — Z8616 Personal history of COVID-19: Secondary | ICD-10-CM

## 2021-01-03 DIAGNOSIS — R042 Hemoptysis: Secondary | ICD-10-CM

## 2021-01-03 DIAGNOSIS — I480 Paroxysmal atrial fibrillation: Secondary | ICD-10-CM | POA: Diagnosis present

## 2021-01-03 DIAGNOSIS — Z20822 Contact with and (suspected) exposure to covid-19: Secondary | ICD-10-CM | POA: Diagnosis present

## 2021-01-03 DIAGNOSIS — I5033 Acute on chronic diastolic (congestive) heart failure: Secondary | ICD-10-CM | POA: Diagnosis present

## 2021-01-03 DIAGNOSIS — I052 Rheumatic mitral stenosis with insufficiency: Secondary | ICD-10-CM | POA: Diagnosis present

## 2021-01-03 DIAGNOSIS — I4729 Other ventricular tachycardia: Secondary | ICD-10-CM

## 2021-01-03 DIAGNOSIS — I472 Ventricular tachycardia: Secondary | ICD-10-CM | POA: Diagnosis present

## 2021-01-03 DIAGNOSIS — R000 Tachycardia, unspecified: Secondary | ICD-10-CM | POA: Diagnosis not present

## 2021-01-03 DIAGNOSIS — N179 Acute kidney failure, unspecified: Secondary | ICD-10-CM | POA: Diagnosis present

## 2021-01-03 DIAGNOSIS — Z954 Presence of other heart-valve replacement: Secondary | ICD-10-CM

## 2021-01-03 DIAGNOSIS — Z79899 Other long term (current) drug therapy: Secondary | ICD-10-CM

## 2021-01-03 DIAGNOSIS — Z7982 Long term (current) use of aspirin: Secondary | ICD-10-CM

## 2021-01-03 DIAGNOSIS — Z7901 Long term (current) use of anticoagulants: Secondary | ICD-10-CM

## 2021-01-03 DIAGNOSIS — I499 Cardiac arrhythmia, unspecified: Secondary | ICD-10-CM

## 2021-01-03 DIAGNOSIS — Z8249 Family history of ischemic heart disease and other diseases of the circulatory system: Secondary | ICD-10-CM

## 2021-01-03 DIAGNOSIS — Z823 Family history of stroke: Secondary | ICD-10-CM

## 2021-01-03 DIAGNOSIS — E876 Hypokalemia: Secondary | ICD-10-CM | POA: Diagnosis present

## 2021-01-03 DIAGNOSIS — T82897A Other specified complication of cardiac prosthetic devices, implants and grafts, initial encounter: Secondary | ICD-10-CM | POA: Diagnosis present

## 2021-01-03 DIAGNOSIS — R791 Abnormal coagulation profile: Secondary | ICD-10-CM

## 2021-01-03 DIAGNOSIS — D599 Acquired hemolytic anemia, unspecified: Secondary | ICD-10-CM | POA: Diagnosis present

## 2021-01-03 DIAGNOSIS — D72829 Elevated white blood cell count, unspecified: Secondary | ICD-10-CM | POA: Diagnosis present

## 2021-01-03 DIAGNOSIS — I471 Supraventricular tachycardia: Secondary | ICD-10-CM | POA: Diagnosis present

## 2021-01-03 DIAGNOSIS — I509 Heart failure, unspecified: Secondary | ICD-10-CM

## 2021-01-03 DIAGNOSIS — Z952 Presence of prosthetic heart valve: Secondary | ICD-10-CM

## 2021-01-03 DIAGNOSIS — D649 Anemia, unspecified: Secondary | ICD-10-CM

## 2021-01-03 DIAGNOSIS — R5381 Other malaise: Secondary | ICD-10-CM | POA: Diagnosis present

## 2021-01-03 LAB — COMPREHENSIVE METABOLIC PANEL
ALT: 21 U/L (ref 0–44)
AST: 43 U/L — ABNORMAL HIGH (ref 15–41)
Albumin: 3.6 g/dL (ref 3.5–5.0)
Alkaline Phosphatase: 65 U/L (ref 38–126)
Anion gap: 15 (ref 5–15)
BUN: 17 mg/dL (ref 6–20)
CO2: 20 mmol/L — ABNORMAL LOW (ref 22–32)
Calcium: 9 mg/dL (ref 8.9–10.3)
Chloride: 102 mmol/L (ref 98–111)
Creatinine, Ser: 1.4 mg/dL — ABNORMAL HIGH (ref 0.61–1.24)
GFR, Estimated: 59 mL/min — ABNORMAL LOW (ref >60)
Glucose, Bld: 159 mg/dL — ABNORMAL HIGH (ref 70–99)
Potassium: 3.3 mmol/L — ABNORMAL LOW (ref 3.5–5.1)
Sodium: 137 mmol/L (ref 135–145)
Total Bilirubin: 1.8 mg/dL — ABNORMAL HIGH (ref 0.3–1.2)
Total Protein: 7.3 g/dL (ref 6.5–8.1)

## 2021-01-03 LAB — CBC WITH DIFFERENTIAL/PLATELET
Abs Immature Granulocytes: 0.16 10*3/uL — ABNORMAL HIGH (ref 0.00–0.07)
Basophils Absolute: 0.1 10*3/uL (ref 0.0–0.1)
Basophils Relative: 1 %
Eosinophils Absolute: 0 10*3/uL (ref 0.0–0.5)
Eosinophils Relative: 0 %
HCT: 24.9 % — ABNORMAL LOW (ref 39.0–52.0)
Hemoglobin: 7 g/dL — ABNORMAL LOW (ref 13.0–17.0)
Immature Granulocytes: 1 %
Lymphocytes Relative: 9 %
Lymphs Abs: 1.3 10*3/uL (ref 0.7–4.0)
MCH: 24.2 pg — ABNORMAL LOW (ref 26.0–34.0)
MCHC: 28.1 g/dL — ABNORMAL LOW (ref 30.0–36.0)
MCV: 86.2 fL (ref 80.0–100.0)
Monocytes Absolute: 1.4 10*3/uL — ABNORMAL HIGH (ref 0.1–1.0)
Monocytes Relative: 10 %
Neutro Abs: 11.2 10*3/uL — ABNORMAL HIGH (ref 1.7–7.7)
Neutrophils Relative %: 79 %
Platelets: 498 10*3/uL — ABNORMAL HIGH (ref 150–400)
RBC: 2.89 MIL/uL — ABNORMAL LOW (ref 4.22–5.81)
RDW: 22 % — ABNORMAL HIGH (ref 11.5–15.5)
WBC: 14.1 10*3/uL — ABNORMAL HIGH (ref 4.0–10.5)
nRBC: 1 % — ABNORMAL HIGH (ref 0.0–0.2)

## 2021-01-03 LAB — PROTIME-INR
INR: 5.9 (ref 0.8–1.2)
Prothrombin Time: 51.1 seconds — ABNORMAL HIGH (ref 11.4–15.2)

## 2021-01-03 LAB — RETICULOCYTES
Immature Retic Fract: 41.2 % — ABNORMAL HIGH (ref 2.3–15.9)
RBC.: 2.58 MIL/uL — ABNORMAL LOW (ref 4.22–5.81)
Retic Count, Absolute: 254.6 10*3/uL — ABNORMAL HIGH (ref 19.0–186.0)
Retic Ct Pct: 9.9 % — ABNORMAL HIGH (ref 0.4–3.1)

## 2021-01-03 LAB — POC OCCULT BLOOD, ED: Fecal Occult Bld: NEGATIVE

## 2021-01-03 LAB — CBG MONITORING, ED: Glucose-Capillary: 164 mg/dL — ABNORMAL HIGH (ref 70–99)

## 2021-01-03 LAB — PREPARE RBC (CROSSMATCH)

## 2021-01-03 LAB — BRAIN NATRIURETIC PEPTIDE: B Natriuretic Peptide: 649.8 pg/mL — ABNORMAL HIGH (ref 0.0–100.0)

## 2021-01-03 MED ORDER — PHYTONADIONE 5 MG PO TABS
2.5000 mg | ORAL_TABLET | Freq: Once | ORAL | Status: AC
  Administered 2021-01-03: 2.5 mg via ORAL
  Filled 2021-01-03: qty 1

## 2021-01-03 MED ORDER — IOHEXOL 300 MG/ML  SOLN
75.0000 mL | Freq: Once | INTRAMUSCULAR | Status: AC | PRN
  Administered 2021-01-03: 75 mL via INTRAVENOUS

## 2021-01-03 MED ORDER — SODIUM CHLORIDE 0.9 % IV SOLN
10.0000 mL/h | Freq: Once | INTRAVENOUS | Status: AC
  Administered 2021-01-03: 10 mL/h via INTRAVENOUS

## 2021-01-03 MED ORDER — POTASSIUM CHLORIDE CRYS ER 20 MEQ PO TBCR
40.0000 meq | EXTENDED_RELEASE_TABLET | Freq: Once | ORAL | Status: AC
  Administered 2021-01-03: 40 meq via ORAL
  Filled 2021-01-03: qty 2

## 2021-01-03 NOTE — ED Provider Notes (Signed)
Nursing notes and vitals signs, including pulse oximetry, reviewed.  Summary of this visit's results, reviewed by myself:  EKG:  EKG Interpretation  Date/Time:  Sunday January 03 2021 20:21:43 EDT Ventricular Rate:  82 PR Interval:    QRS Duration: 82 QT Interval:  345 QTC Calculation: 403 R Axis:   38 Text Interpretation: Sinus rhythm Supraventricular bigeminy Probable LVH with secondary repol abnrm Baseline wander in lead(s) II III aVF Confirmed by Pricilla Loveless [phone]) on 01/03/2021 8:47:39 PM       Labs:  Results for orders placed or performed during the hospital encounter of 01/03/21 (from the past 24 hour(s))  Comprehensive metabolic panel     Status: Abnormal   Collection Time: 01/03/21  8:10 PM  Result Value Ref Range   Sodium 137 135 - 145 mmol/L   Potassium 3.3 (L) 3.5 - 5.1 mmol/L   Chloride 102 98 - 111 mmol/L   CO2 20 (L) 22 - 32 mmol/L   Glucose, Bld 159 (H) 70 - 99 mg/dL   BUN 17 6 - 20 mg/dL   Creatinine, Ser 9.48 (H) 0.61 - 1.24 mg/dL   Calcium 9.0 8.9 - 01.6 mg/dL   Total Protein 7.3 6.5 - 8.1 g/dL   Albumin 3.6 3.5 - 5.0 g/dL   AST 43 (H) 15 - 41 U/L   ALT 21 0 - 44 U/L   Alkaline Phosphatase 65 38 - 126 U/L   Total Bilirubin 1.8 (H) 0.3 - 1.2 mg/dL   GFR, Estimated 59 (L) >60 mL/min   Anion gap 15 5 - 15  CBC with Differential     Status: Abnormal   Collection Time: 01/03/21  8:10 PM  Result Value Ref Range   WBC 14.1 (H) 4.0 - 10.5 K/uL   RBC 2.89 (L) 4.22 - 5.81 MIL/uL   Hemoglobin 7.0 (L) 13.0 - 17.0 g/dL   HCT 55.3 (L) 74.8 - 27.0 %   MCV 86.2 80.0 - 100.0 fL   MCH 24.2 (L) 26.0 - 34.0 pg   MCHC 28.1 (L) 30.0 - 36.0 g/dL   RDW 78.6 (H) 75.4 - 49.2 %   Platelets 498 (H) 150 - 400 K/uL   nRBC 1.0 (H) 0.0 - 0.2 %   Neutrophils Relative % 79 %   Neutro Abs 11.2 (H) 1.7 - 7.7 K/uL   Lymphocytes Relative 9 %   Lymphs Abs 1.3 0.7 - 4.0 K/uL   Monocytes Relative 10 %   Monocytes Absolute 1.4 (H) 0.1 - 1.0 K/uL   Eosinophils Relative 0 %    Eosinophils Absolute 0.0 0.0 - 0.5 K/uL   Basophils Relative 1 %   Basophils Absolute 0.1 0.0 - 0.1 K/uL   Immature Granulocytes 1 %   Abs Immature Granulocytes 0.16 (H) 0.00 - 0.07 K/uL   Schistocytes PRESENT    Polychromasia PRESENT    Target Cells PRESENT    Spherocytes PRESENT    Giant PLTs PRESENT   Brain natriuretic peptide     Status: Abnormal   Collection Time: 01/03/21  8:10 PM  Result Value Ref Range   B Natriuretic Peptide 649.8 (H) 0.0 - 100.0 pg/mL  Protime-INR     Status: Abnormal   Collection Time: 01/03/21  8:10 PM  Result Value Ref Range   Prothrombin Time 51.1 (H) 11.4 - 15.2 seconds   INR 5.9 (HH) 0.8 - 1.2  CBG monitoring, ED     Status: Abnormal   Collection Time: 01/03/21  8:18 PM  Result Value  Ref Range   Glucose-Capillary 164 (H) 70 - 99 mg/dL  Type and screen     Status: None (Preliminary result)   Collection Time: 01/03/21  9:10 PM  Result Value Ref Range   ABO/RH(D) O POS    Antibody Screen NEG    Sample Expiration 01/06/2021,2359    Unit Number [id number]    Blood Component Type RBC LR PHER2    Unit division 00    Status of Unit ALLOCATED    Transfusion Status OK TO TRANSFUSE    Crossmatch Result      Compatible Performed at [facility], [address], [place], [place] [address]   POC occult blood, ED     Status: None   Collection Time: 01/03/21  9:33 PM  Result Value Ref Range   Fecal Occult Bld NEGATIVE NEGATIVE  Prepare RBC (crossmatch)     Status: None   Collection Time: 01/03/21 10:00 PM  Result Value Ref Range   Order Confirmation      ORDER PROCESSED BY BLOOD BANK Performed at [facility], [address] [place], [place] [address]   Reticulocytes     Status: Abnormal   Collection Time: 01/03/21 10:00 PM  Result Value Ref Range   Retic Ct Pct 9.9 (H) 0.4 - 3.1 %   RBC. 2.58 (L) 4.22 - 5.81 MIL/uL   Retic Count, Absolute 254.6 (H) 19.0 - 186.0 K/uL   Immature Retic Fract 41.2 (H) 2.3 -  15.9 %    Imaging Studies: DG Chest 2 View  Result Date: 01/03/2021 CLINICAL DATA:  Hemoptysis EXAM: CHEST - 2 VIEW COMPARISON:  12/18/2020 FINDINGS: Unchanged mild diffuse interstitial opacity. Prosthetic valve. Normal cardiomediastinal contours. No pleural effusion or pneumothorax. IMPRESSION: Unchanged mild diffuse interstitial opacity. Electronically Signed   By: Deatra Robinson M.D.   On: 01/03/2021 21:14   CT Chest W Contrast  Result Date: 01/03/2021 CLINICAL DATA:  Hemoptysis EXAM: CT CHEST WITH CONTRAST TECHNIQUE: Multidetector CT imaging of the chest was performed during intravenous contrast administration. CONTRAST:  34mL OMNIPAQUE IOHEXOL 300 MG/ML  SOLN COMPARISON:  Chest x-ray from earlier in the same day. FINDINGS: Cardiovascular: Thoracic aorta and its branches are within normal limits without aneurysmal dilatation or dissection. 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 No cardiac enlargement is noted. The pulmonary artery as visualized is within normal limits. No evidence of pulmonary emboli are noted. Mediastinum/Nodes: No sizable hilar or mediastinal adenopathy is noted. Few scattered small mediastinal nodes are noted stable from the prior study. Thoracic inlet is within normal limits. The esophagus is unremarkable. Lungs/Pleura: Lungs are well aerated bilaterally. Some very mild interstitial density is noted similar to that seen on prior plain film examination consistent with mild edema. No focal confluent infiltrate or sizable effusion is noted. No sizable parenchymal nodules are seen. Mild emphysematous changes are noted. Upper Abdomen: Visualized upper abdomen is within normal limits. Musculoskeletal: No acute bony abnormality is noted. IMPRESSION: Mild changes of interstitial edema. No other focal abnormality is seen. Aortic Atherosclerosis (ICD10-I70.0) and Emphysema (ICD10-J43.9). Electronically Signed   By: Alcide Clever M.D.   On: 01/03/2021 23:48      Arek Spadafore, Jonny Ruiz, MD 01/04/21  [phone]

## 2021-01-03 NOTE — ED Notes (Signed)
Critical INR 5.9 per lab.

## 2021-01-03 NOTE — ED Triage Notes (Signed)
Pt came I with increasing weakness, States that he got put on Lasix two weeks ago for SOB. He states he has had increasing weakness for three days. Denies SOB.

## 2021-01-03 NOTE — ED Provider Notes (Signed)
[facility]-EMERGENCY DEPT Provider Note   CSN: [id number] Arrival date & time: 01/03/21  1930     History Chief Complaint  Patient presents with  . Weakness    Melvin Burns is a 55 y.o. male.  HPI 55 year old male presents with fatigue/weakness.  This started about 3 days ago.  Whenever he walks he feels way more tired than he feels like he should.  He does not really feel short of breath but does have to stop and catch his breath.  He states he is been having a cough since he left the hospital after a CHF exacerbation but over the last 3 days he is now having some blood when coughing.  He denies any fever, chest pain, shortness of breath, or melena/rectal bleeding.  He did feel like last night his heart was beating faster than typical.   Past Medical History:  Diagnosis Date  . Hypertension   . Incidental pulmonary nodule 01/08/2020   Multiple in right lung - need f/u CT 1 year due to h/o smoking  . Mild aortic stenosis   . Mitral valve stenosis, severe   . Moderate mitral regurgitation   . Pneumonia    x 1  . Rheumatic mitral stenosis with regurgitation    Echo 08/08/16:  Severe mitral stenosis (mean gradient 17 mmHg).  Moderate mitral regurgitation.  . 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 Wears glasses   . Wears partial dentures    upper    Patient Active Problem List   Diagnosis Date Noted  . CHF exacerbation (HCC) 12/18/2020  . Leukocytosis 12/18/2020  . Acute respiratory failure with hypoxia (HCC) 12/04/2020  . COVID-19 virus infection 12/04/2020  . PAF (paroxysmal atrial fibrillation) (HCC) 03/30/2020  . Bacteremia due to Enterobacter species 03/13/2020  . IV site infection (HCC) 03/13/2020  . 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 Incidental  pulmonary nodule 01/08/2020  . Pre-operative clearance 06/11/2019  . Mitral valve stenosis, severe 08/15/2017  . Moderate mitral regurgitation 08/15/2017  . Abdominal pain, epigastric 01/02/2017  . Non-intractable vomiting with nausea 01/02/2017  . 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 Family history of stroke 03/11/2013  . Essential hypertension 03/11/2013  . Smoker 03/11/2013  . Chronic periodontitis, unspecified 03/11/2013    Past Surgical History:  Procedure Laterality Date  . BILATERAL KNEE ARTHROSCOPY    . BUBBLE STUDY  12/16/2019   Procedure: BUBBLE STUDY;  Surgeon: [name], MD;  Location: [facility] ENDOSCOPY;  Service: Cardiology;;  . MITRAL VALVE REPLACEMENT Right 03/06/2020   Procedure: MINIMALLY INVASIVE MITRAL VALVE (MV) REPLACEMENT using Carbomedics Optiform 33 MM Mitral Bioprosthetic Valve.;  Surgeon: Purcell Nails, MD;  Location: [facility] OR;  Service: Open Heart Surgery;  Laterality: Right;  . MULTIPLE EXTRACTIONS WITH ALVEOLOPLASTY N/A 12/31/2019   Procedure: Extraction of tooth #'s 16-17, 20-23, and 28-30 with alveoloplasty, Bilateral mandibular tori reductions, and lateral exostoses reductions.;  Surgeon: Charlynne Pander, DDS;  Location: [facility] OR;  Service: Oral Surgery;  Laterality: N/A;  . RIGHT/LEFT HEART CATH AND CORONARY ANGIOGRAPHY N/A 12/16/2019   Procedure: RIGHT/LEFT HEART CATH AND CORONARY ANGIOGRAPHY;  Surgeon: Yvonne Kendall, MD;  Location: [facility] INVASIVE CV LAB;  Service: Cardiovascular;  Laterality: N/A;  . TEE WITHOUT CARDIOVERSION N/A 12/16/2019   Procedure: TRANSESOPHAGEAL ECHOCARDIOGRAM (TEE);  Surgeon: [name], MD;  Location: [facility]  ENDOSCOPY;  Service: Cardiology;  Laterality: N/A;  . TEE WITHOUT CARDIOVERSION N/A 03/06/2020   Procedure: TRANSESOPHAGEAL ECHOCARDIOGRAM (TEE);  Surgeon: Purcell Nails, MD;  Location: [facility] OR;  Service: Open Heart Surgery;  Laterality: N/A;       Family History  Problem Relation Age of Onset  .  Hypertension Mother   . Hypertension Father   . Stroke Father     Social History   Tobacco Use  . Smoking status: Former Smoker    Packs/day: 1.00    Years: 35.00    Pack years: 35.00    Types: Cigarettes    Quit date: 12/09/2019    Years since quitting: 1.0  . Smokeless tobacco: Never Used  Vaping Use  . Vaping Use: Never used  Substance Use Topics  . Alcohol use: No  . Drug use: No    Home Medications Prior to Admission medications   Medication Sig Start Date End Date Taking? Authorizing Provider  amLODipine (NORVASC) 10 MG tablet Take 10 mg by mouth daily.   Yes [provider]  aspirin EC 81 MG tablet Take 1 tablet (81 mg total) by mouth daily. 12/16/19  Yes End, Cristal Deer, MD  furosemide (LASIX) 40 MG tablet Take 1 tablet (40 mg total) by mouth daily for 30 doses. 12/25/20 Feb 13, 2021 Yes Cleaver, Thomasene Ripple, NP  lisinopril (ZESTRIL) 10 MG tablet Take 1 tablet (10 mg total) by mouth daily - in the evenings 12/07/20  Yes Briant Cedar, MD  nebivolol (BYSTOLIC) 5 MG tablet Take 1 tablet (5 mg total) by mouth daily. 07/01/20  Yes Chilton Si, MD  warfarin (COUMADIN) 5 MG tablet Take 1 tablet (5 mg total) by mouth daily at 4 PM. Patient taking differently: Take 5-7.5 mg by mouth See admin instructions. Take 1 tablet by mouth on Tues, Thurs, and Sat. All other days take 1 & 1/2 tablet. 07/28/20  Yes Chilton Si, MD    Allergies    Patient has no known allergies.  Review of Systems   Review of Systems  Constitutional: Positive for fatigue. Negative for fever.  HENT: Positive for nosebleeds (occasional, chronic).   Respiratory: Positive for cough and shortness of breath.   Cardiovascular: Positive for palpitations. Negative for chest pain.  Gastrointestinal: Negative for abdominal pain and blood in stool.  Neurological: Positive for weakness. Negative for headaches.  All other systems reviewed and are negative.   Physical Exam Updated Vital Signs BP  106/66   Pulse 85   Temp 97.9 F (36.6 C) (Oral)   Resp (!) 22   SpO2 99%   Physical Exam Vitals and nursing note reviewed. Exam conducted with a chaperone present.  Constitutional:      General: He is not in acute distress.    Appearance: He is well-developed. He is not ill-appearing or diaphoretic.  HENT:     Head: Normocephalic and atraumatic.     Right Ear: External ear normal.     Left Ear: External ear normal.     Nose: Nose normal.  Eyes:     General:        Right eye: No discharge.        Left eye: No discharge.     Extraocular Movements: Extraocular movements intact.     Pupils: Pupils are equal, round, and reactive to light.  Cardiovascular:     Rate and Rhythm: Normal rate and regular rhythm.     Heart sounds: Murmur (click from valve replacement) heard.  Pulmonary:     Effort: Pulmonary effort is normal.     Breath sounds: Normal breath sounds.  Abdominal:     General: There is no distension.     Palpations: Abdomen is soft.     Tenderness: There is no abdominal tenderness.  Genitourinary:    Rectum: No tenderness, anal fissure, external hemorrhoid or internal hemorrhoid. Normal anal tone.     Comments: Light brown stool on DRE Musculoskeletal:     Cervical back: Neck supple.  Skin:    General: Skin is warm and dry.  Neurological:     Mental Status: He is alert.     Comments: CN 3-12 grossly intact. 5/5 strength in all 4 extremities. Grossly normal sensation. Normal finger to nose.   Psychiatric:        Mood and Affect: Mood is not anxious.     ED Results / Procedures / Treatments   Labs (all labs ordered are listed, but only abnormal results are displayed) Labs Reviewed  COMPREHENSIVE METABOLIC PANEL - Abnormal; Notable for the following components:      Result Value   Potassium 3.3 (*)    CO2 20 (*)    Glucose, Bld 159 (*)    Creatinine, Ser 1.40 (*)    AST 43 (*)    Total Bilirubin 1.8 (*)    GFR, Estimated 59 (*)    All other components  within normal limits  CBC WITH DIFFERENTIAL/PLATELET - Abnormal; Notable for the following components:   WBC 14.1 (*)    RBC 2.89 (*)    Hemoglobin 7.0 (*)    HCT 24.9 (*)    MCH 24.2 (*)    MCHC 28.1 (*)    RDW 22.0 (*)    Platelets 498 (*)    nRBC 1.0 (*)    Neutro Abs 11.2 (*)    Monocytes Absolute 1.4 (*)    Abs Immature Granulocytes 0.16 (*)    All other components within normal limits  BRAIN NATRIURETIC PEPTIDE - Abnormal; Notable for the following components:   B Natriuretic Peptide 649.8 (*)    All other components within normal limits  PROTIME-INR - Abnormal; Notable for the following components:   Prothrombin Time 51.1 (*)    INR 5.9 (*)    All other components within normal limits  RETICULOCYTES - Abnormal; Notable for the following components:   Retic Ct Pct 9.9 (*)    RBC. 2.58 (*)    Retic Count, Absolute 254.6 (*)    Immature Retic Fract 41.2 (*)    All other components within normal limits  CBG MONITORING, ED - Abnormal; Notable for the following components:   Glucose-Capillary 164 (*)    All other components within normal limits  POC OCCULT BLOOD, ED  TYPE AND SCREEN  PREPARE RBC (CROSSMATCH)    EKG EKG Interpretation  Date/Time:  Sunday January 03 2021 20:21:43 EDT Ventricular Rate:  82 PR Interval:    QRS Duration: 82 QT Interval:  345 QTC Calculation: 403 R Axis:   38 Text Interpretation: Sinus rhythm Supraventricular bigeminy Probable LVH with secondary repol abnrm Baseline wander in lead(s) II III aVF Confirmed by Pricilla LovelessGoldston, Jewelianna Pancoast [phone]([id number]) on 01/03/2021 8:47:39 PM   Radiology DG Chest 2 View  Result Date: 01/03/2021 CLINICAL DATA:  Hemoptysis EXAM: CHEST - 2 VIEW COMPARISON:  12/18/2020 FINDINGS: Unchanged mild diffuse interstitial opacity. Prosthetic valve. Normal cardiomediastinal contours. No pleural effusion or pneumothorax. IMPRESSION: Unchanged mild diffuse interstitial opacity. Electronically Signed   By: Caryn BeeKevin  Chase Picket M.D.   On: 01/03/2021  21:14    Procedures .Critical Care Performed by: Pricilla Loveless, MD Authorized by: Pricilla Loveless, MD   Critical care provider statement:    Critical care time (minutes):  35   Critical care time was exclusive of:  Separately billable procedures and treating other patients   Critical care was necessary to treat or prevent imminent or life-threatening deterioration of the following conditions:  Circulatory failure   Critical care was time spent personally by me on the following activities:  Discussions with consultants, evaluation of patient's response to treatment, examination of patient, ordering and performing treatments and interventions, ordering and review of laboratory studies, ordering and review of radiographic studies, pulse oximetry, re-evaluation of patient's condition, obtaining history from patient or surrogate and review of old charts     Medications Ordered in ED Medications  phytonadione (VITAMIN K) tablet 2.5 mg (2.5 mg Oral Given 01/03/21 2143)  potassium chloride SA (KLOR-CON) CR tablet 40 mEq (40 mEq Oral Given 01/03/21 2142)  0.9 %  sodium chloride infusion (10 mL/hr Intravenous New Bag/Given 01/03/21 2226)    ED Course  I have reviewed the triage vital signs and the nursing notes.  Pertinent labs & imaging results that were available during my care of the patient were reviewed by me and considered in my medical decision making (see chart for details).    MDM Rules/Calculators/A&P                          Patient appears to have symptomatic anemia with hemoglobin 7.0. this is down trending from earlier in the week and before. His hemoptysis seems mild, but will get CT of chest. His INR is supra therapeutic, so will give Vitamin K. No obvious GI bleed. Given all this, he will need admission. Will transfuse 1 unit for the symptomatic anemia. Chest CT currently pending to rule out pulmonary hemorrhage. Care to Dr. Read Drivers. Will need admission after this Final Clinical  Impression(s) / ED Diagnoses Final diagnoses:  Symptomatic anemia  Elevated international normalized ratio (INR)  Hemoptysis    Rx / DC Orders ED Discharge Orders    None       Pricilla Loveless, MD 01/03/21 2313

## 2021-01-04 ENCOUNTER — Encounter (HOSPITAL_COMMUNITY): Payer: Self-pay | Admitting: Internal Medicine

## 2021-01-04 DIAGNOSIS — Z823 Family history of stroke: Secondary | ICD-10-CM | POA: Diagnosis not present

## 2021-01-04 DIAGNOSIS — I499 Cardiac arrhythmia, unspecified: Secondary | ICD-10-CM | POA: Diagnosis present

## 2021-01-04 DIAGNOSIS — Z954 Presence of other heart-valve replacement: Secondary | ICD-10-CM | POA: Diagnosis not present

## 2021-01-04 DIAGNOSIS — I480 Paroxysmal atrial fibrillation: Secondary | ICD-10-CM | POA: Diagnosis present

## 2021-01-04 DIAGNOSIS — E876 Hypokalemia: Secondary | ICD-10-CM | POA: Diagnosis present

## 2021-01-04 DIAGNOSIS — N179 Acute kidney failure, unspecified: Secondary | ICD-10-CM | POA: Diagnosis present

## 2021-01-04 DIAGNOSIS — R000 Tachycardia, unspecified: Secondary | ICD-10-CM | POA: Diagnosis present

## 2021-01-04 DIAGNOSIS — Z7901 Long term (current) use of anticoagulants: Secondary | ICD-10-CM | POA: Diagnosis not present

## 2021-01-04 DIAGNOSIS — Z79899 Other long term (current) drug therapy: Secondary | ICD-10-CM | POA: Diagnosis not present

## 2021-01-04 DIAGNOSIS — Z7982 Long term (current) use of aspirin: Secondary | ICD-10-CM | POA: Diagnosis not present

## 2021-01-04 DIAGNOSIS — J9601 Acute respiratory failure with hypoxia: Secondary | ICD-10-CM | POA: Diagnosis not present

## 2021-01-04 DIAGNOSIS — D599 Acquired hemolytic anemia, unspecified: Secondary | ICD-10-CM | POA: Diagnosis present

## 2021-01-04 DIAGNOSIS — Z8249 Family history of ischemic heart disease and other diseases of the circulatory system: Secondary | ICD-10-CM | POA: Diagnosis not present

## 2021-01-04 DIAGNOSIS — Z20822 Contact with and (suspected) exposure to covid-19: Secondary | ICD-10-CM | POA: Diagnosis present

## 2021-01-04 DIAGNOSIS — I10 Essential (primary) hypertension: Secondary | ICD-10-CM | POA: Diagnosis not present

## 2021-01-04 DIAGNOSIS — I509 Heart failure, unspecified: Secondary | ICD-10-CM | POA: Diagnosis not present

## 2021-01-04 DIAGNOSIS — I472 Ventricular tachycardia: Secondary | ICD-10-CM | POA: Diagnosis present

## 2021-01-04 DIAGNOSIS — R5381 Other malaise: Secondary | ICD-10-CM | POA: Diagnosis present

## 2021-01-04 DIAGNOSIS — Z952 Presence of prosthetic heart valve: Secondary | ICD-10-CM | POA: Diagnosis not present

## 2021-01-04 DIAGNOSIS — D509 Iron deficiency anemia, unspecified: Secondary | ICD-10-CM | POA: Diagnosis present

## 2021-01-04 DIAGNOSIS — I5033 Acute on chronic diastolic (congestive) heart failure: Secondary | ICD-10-CM | POA: Diagnosis present

## 2021-01-04 DIAGNOSIS — I5031 Acute diastolic (congestive) heart failure: Secondary | ICD-10-CM | POA: Diagnosis not present

## 2021-01-04 DIAGNOSIS — D72829 Elevated white blood cell count, unspecified: Secondary | ICD-10-CM | POA: Diagnosis present

## 2021-01-04 DIAGNOSIS — D649 Anemia, unspecified: Secondary | ICD-10-CM | POA: Diagnosis present

## 2021-01-04 DIAGNOSIS — R791 Abnormal coagulation profile: Secondary | ICD-10-CM

## 2021-01-04 DIAGNOSIS — Z87891 Personal history of nicotine dependence: Secondary | ICD-10-CM | POA: Diagnosis not present

## 2021-01-04 DIAGNOSIS — I110 Hypertensive heart disease with heart failure: Secondary | ICD-10-CM | POA: Diagnosis present

## 2021-01-04 DIAGNOSIS — Z8616 Personal history of COVID-19: Secondary | ICD-10-CM | POA: Diagnosis not present

## 2021-01-04 DIAGNOSIS — I471 Supraventricular tachycardia: Secondary | ICD-10-CM | POA: Diagnosis present

## 2021-01-04 DIAGNOSIS — R7402 Elevation of levels of lactic acid dehydrogenase (LDH): Secondary | ICD-10-CM | POA: Diagnosis present

## 2021-01-04 LAB — URINALYSIS, ROUTINE W REFLEX MICROSCOPIC
Bacteria, UA: NONE SEEN
Bilirubin Urine: NEGATIVE
Glucose, UA: NEGATIVE mg/dL
Ketones, ur: NEGATIVE mg/dL
Leukocytes,Ua: NEGATIVE
Nitrite: NEGATIVE
Protein, ur: 30 mg/dL — AB
Specific Gravity, Urine: >1.046 — ABNORMAL HIGH (ref 1.005–1.030)
pH: 6 (ref 5.0–8.0)

## 2021-01-04 LAB — TYPE AND SCREEN
ABO/RH(D): O POS
Antibody Screen: NEGATIVE
Unit division: 0

## 2021-01-04 LAB — COMPREHENSIVE METABOLIC PANEL
ALT: 27 U/L (ref 0–44)
AST: 47 U/L — ABNORMAL HIGH (ref 15–41)
Albumin: 3.3 g/dL — ABNORMAL LOW (ref 3.5–5.0)
Alkaline Phosphatase: 64 U/L (ref 38–126)
Anion gap: 10 (ref 5–15)
BUN: 17 mg/dL (ref 6–20)
CO2: 21 mmol/L — ABNORMAL LOW (ref 22–32)
Calcium: 8.9 mg/dL (ref 8.9–10.3)
Chloride: 105 mmol/L (ref 98–111)
Creatinine, Ser: 1.08 mg/dL (ref 0.61–1.24)
GFR, Estimated: >60 mL/min (ref >60)
Glucose, Bld: 146 mg/dL — ABNORMAL HIGH (ref 70–99)
Potassium: 3.6 mmol/L (ref 3.5–5.1)
Sodium: 136 mmol/L (ref 135–145)
Total Bilirubin: 1.4 mg/dL — ABNORMAL HIGH (ref 0.3–1.2)
Total Protein: 6.9 g/dL (ref 6.5–8.1)

## 2021-01-04 LAB — BPAM RBC
Blood Product Expiration Date: 202204272359
ISSUE DATE / TIME: 202203272356
Unit Type and Rh: 5100

## 2021-01-04 LAB — PROTIME-INR
INR: 4.3 (ref 0.8–1.2)
Prothrombin Time: 39.6 seconds — ABNORMAL HIGH (ref 11.4–15.2)

## 2021-01-04 LAB — CBC
HCT: 26.8 % — ABNORMAL LOW (ref 39.0–52.0)
Hemoglobin: 8 g/dL — ABNORMAL LOW (ref 13.0–17.0)
MCH: 25.5 pg — ABNORMAL LOW (ref 26.0–34.0)
MCHC: 29.9 g/dL — ABNORMAL LOW (ref 30.0–36.0)
MCV: 85.4 fL (ref 80.0–100.0)
Platelets: 428 10*3/uL — ABNORMAL HIGH (ref 150–400)
RBC: 3.14 MIL/uL — ABNORMAL LOW (ref 4.22–5.81)
RDW: 20.6 % — ABNORMAL HIGH (ref 11.5–15.5)
WBC: 14.5 10*3/uL — ABNORMAL HIGH (ref 4.0–10.5)
nRBC: 0.6 % — ABNORMAL HIGH (ref 0.0–0.2)

## 2021-01-04 LAB — MAGNESIUM: Magnesium: 2.1 mg/dL (ref 1.7–2.4)

## 2021-01-04 LAB — SARS CORONAVIRUS 2 (TAT 6-24 HRS): SARS Coronavirus 2: NEGATIVE

## 2021-01-04 MED ORDER — FUROSEMIDE 40 MG PO TABS
40.0000 mg | ORAL_TABLET | Freq: Every day | ORAL | Status: DC
  Administered 2021-01-04: 40 mg via ORAL
  Filled 2021-01-04: qty 1

## 2021-01-04 MED ORDER — ACETAMINOPHEN 325 MG PO TABS: 650.0000 mg | ORAL_TABLET | Freq: Four times a day (QID) | ORAL | Status: DC | PRN

## 2021-01-04 MED ORDER — METOPROLOL TARTRATE 5 MG/5ML IV SOLN
5.0000 mg | INTRAVENOUS | Status: DC | PRN
  Administered 2021-01-04: 5 mg via INTRAVENOUS
  Filled 2021-01-04 (×3): qty 5

## 2021-01-04 MED ORDER — FUROSEMIDE 10 MG/ML IJ SOLN
40.0000 mg | Freq: Every day | INTRAMUSCULAR | Status: DC
  Administered 2021-01-04 – 2021-01-06 (×3): 40 mg via INTRAVENOUS
  Filled 2021-01-04 (×3): qty 4

## 2021-01-04 MED ORDER — AMLODIPINE BESYLATE 5 MG PO TABS: 10.0000 mg | ORAL_TABLET | Freq: Every day | ORAL | Status: DC

## 2021-01-04 MED ORDER — NEBIVOLOL HCL 5 MG PO TABS
5.0000 mg | ORAL_TABLET | Freq: Every day | ORAL | Status: DC
  Administered 2021-01-04 – 2021-01-07 (×4): 5 mg via ORAL
  Filled 2021-01-04 (×4): qty 1

## 2021-01-04 MED ORDER — IPRATROPIUM-ALBUTEROL 0.5-2.5 (3) MG/3ML IN SOLN: 3.0000 mL | RESPIRATORY_TRACT | Status: DC | PRN

## 2021-01-04 MED ORDER — ACETAMINOPHEN 650 MG RE SUPP: 650.0000 mg | Freq: Four times a day (QID) | RECTAL | Status: DC | PRN

## 2021-01-04 MED ORDER — SODIUM CHLORIDE 0.9% FLUSH
3.0000 mL | Freq: Two times a day (BID) | INTRAVENOUS | Status: DC
  Administered 2021-01-04 – 2021-01-08 (×10): 3 mL via INTRAVENOUS

## 2021-01-04 NOTE — Progress Notes (Signed)
Pt  HR on the heart monitor between 120-to 152. Patient was + for palpitation and mild SOB. 2l [employer] oxygen swas applied for comfort.. ON call X blount made aware of situation. New order of lopressor 5mg  iv  was given x1 dose. + efectiveness. Hr in the high 70's now. Will cont to monitor pt situation.

## 2021-01-04 NOTE — Progress Notes (Signed)
PROGRESS NOTE    Melvin Burns  KGY:[id number] DOB: 1966-08-04 DOA: 01/03/2021 PCP: Skeet Latch, MD    Brief Narrative:  55 y.o. 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 fib, COVID-19 infection who presents with 3 days of fatigue and weakness.  As above patient has had fatigue and weakness for the past few days especially on exertion such as walking.  He also reports a call since he was discharged from an admission for CHF from 311 2 3/14.  He reports coughing up some specks of blood for the past week.  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 > Of note he was recently seen for leukocytosis by hematology and was noted to have a hemoglobin of 7.4 at that time which was down from 9.42 weeks ago.  > A work-up for his anemia has already begun after that outpatient visit.  And is as follows: Negative DAT, normal folate, normal B12, normal ferritin.  Low iron, abnormal multiple myeloma panel with elevated IgA. > As he was seen hematology outpatient may benefit from their input. > He does have chronically schistocytes, polychromasia, target cells on CBCs likely due to destruction from mechanical heart valve. - 1 unit of [place] was ordered overnight, hgb improved to 8.0  AKI Hypokalemia >Will replace -repeat bmet in AM, cont to correct as tolerated  Paroxysmal A. Fib Supraventricular  tachycardia - We will continue to monitor on telemetry and continue beta-blocker starting tomorrow.  We will need to monitor blood pressures carefully as they are chronically soft. - continue on tele   Acute on chronic diastolic CHF > Patient with a known history of CHF last echo 12/04/2020 with EF 60-65% and indeterminate diastolic parameters, normal RV function. > Was recently admitted for CHF exacerbation from 3/11-3/14.  BNP is elevated at 649 which is higher than his presentation for previous admission which was over 100 > CT chest reviewed, appears wet -On exam, pt appears sob. Pt is also asking for lasix -This AM when seen, patient was receiving 3L O2 via [place] -Will start IV lasix 33m IV BID  Leukocytosis > This is chronic and was noted at last admission as well > As above he was referred to hematology for this they did flow cytometry to evaluate for CLL which came back negative per their clinic note. - repeat cbc in AM  Hypertension > Patient remains on nebivolol.  However his lisinopril has been held outpatient due to low blood pressures. - We will continue nebivolol - Held amlodipine and lisinopril  Rheumatic heart disease status post mechanical valve - We will continue warfarin per pharmacy after recheck of INR followin partial reversal in ED -cont to follow INR trends. Goal INR is 2.5 to 3.5  DVT prophylaxis: Coumadin Code Status: Full Family Communication: Pt in room, family not at bedside  Status is: Observation  The patient will require care spanning > 2 midnights and should be moved to inpatient because: IV treatments appropriate due  to intensity of illness or inability to take PO and Inpatient level of care appropriate due to severity of illness  Dispo: The patient is from: Home              Anticipated d/c is to: Home              Patient currently is not medically stable to d/c.   Difficult to place patient No       Consultants:     Procedures:      Antimicrobials: Anti-infectives (From admission, onward)   None       Subjective: Reported feeling sob this AM  Objective: Vitals:   01/04/21 0719 01/04/21 0807 01/04/21 0856 01/04/21 1326  BP: 113/84 (!) 120/97 126/77 115/76  Pulse: 76 74 81 79  Resp: '16 16 14 16  ' Temp: 98.1 F (36.7 C) 98.1 F (36.7 C) 98.1 F (36.7 C) (!) 97.1 F (36.2 C)  TempSrc:      SpO2: 99% 98% 99% 100%  Weight:      Height:        Intake/Output Summary (Last 24 hours) at 01/04/2021 1444 Last data filed at 01/04/2021 1247 Gross per 24 hour  Intake 1142.75 ml  Output --  Net 1142.75 ml   Filed Weights   01/04/21 0206  Weight: 75.8 kg    Examination: General exam: Awake, laying in bed, in nad Respiratory system: Normal respiratory effort, no wheezing Cardiovascular system: regular rate, s1, s2 Gastrointestinal system: Soft, nondistended, positive BS Central nervous system: CN2-12 grossly intact, strength intact Extremities: Perfused, no clubbing Skin: Normal skin turgor, no notable skin lesions seen Psychiatry: Mood normal // no visual hallucinations    Data Reviewed: I have personally reviewed following labs and imaging studies  CBC: Recent Labs  Lab 12/30/20 1617 01/03/21 2010 01/04/21 0548  WBC 13.4* 14.1* 14.5*  NEUTROABS 8.9* 11.2*  --   HGB 7.4* 7.0* 8.0*  HCT 24.5* 24.9* 26.8*  MCV 86.3 86.2 85.4  PLT 335 498* 103*   Basic Metabolic Panel: Recent Labs  Lab 12/30/20 1617 01/03/21 2010 01/04/21 0548  NA 140 137 136  K 3.4* 3.3* 3.6  CL 104 102 105  CO2 23 20* 21*  GLUCOSE 136* 159* 146*  BUN '15 17 17  ' CREATININE 1.03 1.40* 1.08  CALCIUM 9.4 9.0 8.9  MG  --  2.1  --    GFR: Estimated Creatinine Clearance: 76.4 mL/min (by C-G formula based on SCr of 1.08 mg/dL). Liver Function Tests: Recent Labs  Lab 12/30/20 1617 01/03/21 2010 01/04/21 0548  AST 40 43* 47*  ALT '16 21 27  ' ALKPHOS 67 65 64  BILITOT 1.2 1.8* 1.4*  PROT 7.8 7.3 6.9  ALBUMIN 3.7  3.6 3.3*   No results for input(s): LIPASE, AMYLASE in the last 168 hours. No results for input(s): AMMONIA in the last 168 hours. Coagulation Profile: Recent Labs  Lab 01/03/21 2010 01/04/21 0548  INR 5.9* 4.3*   Cardiac Enzymes: No results for input(s): CKTOTAL, CKMB, CKMBINDEX, TROPONINI in the last 168 hours. BNP (last 3 results) No results for input(s): PROBNP in the last 8760 hours. HbA1C: No results for input(s): HGBA1C in the last 72 hours. CBG: Recent Labs  Lab 01/03/21 2018  GLUCAP 164*   Lipid Profile: No results for input(s): CHOL, HDL, LDLCALC, TRIG, CHOLHDL, LDLDIRECT in the last 72 hours. Thyroid Function Tests: No results for input(s): TSH, T4TOTAL, FREET4, T3FREE, THYROIDAB in the last 72 hours. Anemia Panel: Recent Labs  01/03/21 2200  RETICCTPCT 9.9*   Sepsis Labs: No results for input(s): PROCALCITON, LATICACIDVEN in the last 168 hours.  Recent Results (from the past 240 hour(s))  SARS CORONAVIRUS 2 (TAT 6-24 HRS) Nasopharyngeal Nasopharyngeal Swab     Status: None   Collection Time: 01/04/21 12:34 AM   Specimen: Nasopharyngeal Swab  Result Value Ref Range Status   SARS Coronavirus 2 NEGATIVE NEGATIVE Final    Comment: (NOTE) SARS-CoV-2 target nucleic acids are NOT DETECTED.  The SARS-CoV-2 RNA is generally detectable in upper and lower respiratory specimens during the acute phase of infection. Negative results do not preclude SARS-CoV-2 infection, do not rule out co-infections with other pathogens, and should not be used as the sole basis for treatment or other patient management decisions. Negative results must be combined with clinical observations, patient history, and epidemiological information. The expected result is Negative.  Fact Sheet for Patients: SugarRoll.be  Fact Sheet for Healthcare Providers: https://www.woods-mathews.com/  This test is not yet approved or cleared by the [place] FDA and  has been authorized for detection and/or diagnosis of SARS-CoV-2 by FDA under an Emergency Use Authorization (EUA). This EUA will remain  in effect (meaning this test can be used) for the duration of the COVID-19 declaration under Se ction 564(b)(1) of the Act, 21 U.S.C. section 360bbb-3(b)(1), unless the authorization is terminated or revoked sooner.  Performed at [facility], [place] [address], [place], [place] [address]      Radiology Studies: DG Chest 2 View  Result Date: 01/03/2021 CLINICAL DATA:  Hemoptysis EXAM: CHEST - 2 VIEW COMPARISON:  12/18/2020 FINDINGS: Unchanged mild diffuse interstitial opacity. Prosthetic valve. Normal cardiomediastinal contours. No pleural effusion or pneumothorax. IMPRESSION: Unchanged mild diffuse interstitial opacity. Electronically Signed   By: Ulyses Jarred M.D.   On: 01/03/2021 21:14   CT Chest W Contrast  Result Date: 01/03/2021 CLINICAL DATA:  Hemoptysis EXAM: CT CHEST WITH CONTRAST TECHNIQUE: Multidetector CT imaging of the chest was performed during intravenous contrast administration. CONTRAST:  31m OMNIPAQUE IOHEXOL 300 MG/ML  SOLN COMPARISON:  Chest x-ray from earlier in the same day. FINDINGS: Cardiovascular: Thoracic aorta and its branches are within normal limits without aneurysmal dilatation or dissection. 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 No cardiac enlargement is noted. The pulmonary artery as visualized is within normal limits. No evidence of pulmonary emboli are noted. Mediastinum/Nodes: No sizable hilar or mediastinal adenopathy is noted. Few scattered small mediastinal nodes are noted stable from the prior study. Thoracic inlet is within normal limits. The esophagus is unremarkable. Lungs/Pleura: Lungs are well aerated bilaterally. Some very mild interstitial density is noted similar to that seen on prior plain film examination consistent with mild edema. No focal confluent infiltrate or sizable effusion is noted. No  sizable parenchymal nodules are seen. Mild emphysematous changes are noted. Upper Abdomen: Visualized upper abdomen is within normal limits. Musculoskeletal: No acute bony abnormality is noted. IMPRESSION: Mild changes of interstitial edema. No other focal abnormality is seen. Aortic Atherosclerosis (ICD10-I70.0) and Emphysema (ICD10-J43.9). Electronically Signed   By: MInez CatalinaM.D.   On: 01/03/2021 23:48    Scheduled Meds: . furosemide  40 mg Oral Daily  . nebivolol  5 mg Oral Daily  . sodium chloride flush  3 mL Intravenous Q12H   Continuous Infusions:   LOS: 0 days   SMarylu Lund MD [facility] Pager On Amion  If 7PM-7AM, please contact night-coverage 01/04/2021, 2:44 PM

## 2021-01-04 NOTE — Progress Notes (Signed)
SATURATION QUALIFICATIONS: (This note is used to comply with regulatory documentation for home oxygen)  Patient Saturations on Room Air at Rest = 98%  Patient Saturations on Room Air while Ambulating = 91%  Patient Saturations on 0 Liters of oxygen while Ambulating = 91%  Pt does complain of SOB even though saturation does not go below 91%

## 2021-01-04 NOTE — Progress Notes (Signed)
   01/04/21 0503  Assess: MEWS Score  BP 113/80  ECG Heart Rate (!) 152  Resp (!) 22  Level of Consciousness Alert  Assess: MEWS Score  MEWS Temp 0  MEWS Systolic 0  MEWS Pulse 3  MEWS RR 1  MEWS LOC 0  MEWS Score 4  MEWS Score Color Red  Assess: if the MEWS score is Yellow or Red  Were vital signs taken at a resting state? Yes  Focused Assessment No change from prior assessment  Early Detection of Sepsis Score *See Row Information* Low  MEWS guidelines implemented *See Row Information* No, previously red, continue vital signs every 4 hours  Treat  MEWS Interventions Administered scheduled meds/treatments  Pain Scale 0-10  Pain Score 0  Take Vital Signs  Increase Vital Sign Frequency  Red: Q 1hr X 4 then Q 4hr X 4, if remains red, continue Q 4hrs  Escalate  MEWS: Escalate Red: discuss with charge nurse/RN and provider, consider discussing with RRT  Notify: Charge Nurse/RN  Name of Charge Nurse/RN Notified Irving Burton  Date Charge Nurse/RN Notified 01/04/21  Time Charge Nurse/RN Notified [id number]  Notify: Provider  Provider Name/Title x [facility]  Date Provider Notified 01/04/21  Time Provider Notified 0400  Notification Type Page  Notification Reason Other (Comment) (hr high 152)  Provider response See new orders  Date of Provider Response 01/04/21  Time of Provider Response 0400  Document  Patient Outcome Stabilized after interventions  Progress note created (see row info) Yes

## 2021-01-04 NOTE — H&P (Addendum)
History and Physical   Melvin Burns WJX:[id number] DOB: 11-Aug-1966 DOA: 01/03/2021  PCP: Skeet Latch, MD   Patient coming from: Home  Chief Complaint: Fatigue, weakness  HPI: Melvin Burns is a 55 y.o. 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 fib, COVID-19 infection who presents with 3 days of fatigue and weakness.  As above patient has had fatigue and weakness for the past few days especially on exertion such as walking.  He also reports a call since he was discharged from an admission for CHF from 311 2 3/14.  He reports coughing up some specks of blood for the past week.  He denies dark or bloody stools.  He also reports some intermittent palpitations.  He denies fever, chills, chest pain, abdominal pain, constipation, diarrhea, nausea, vomiting.  ED Course: Vital signs in the ED significant for soft blood pressure in the 90s to 110s for the most part, which appears to be chronic for him.  Lab work-up showed CMP with potassium of 3.3, bicarb 20, creatinine elevated to 1.4 from baseline around 1, glucose 159, AST mildly elevated to 43 not far from baseline, T bili 1.8.  CBC with leukocytosis to 14.1 which is chronic, platelets elevated above baseline at 498, hemoglobin down to 7 which is down from 7.4 at recent lab work visit and down from 9.42 weeks ago.  Also noted with schistocytes, polychromasia, target cells, spherocytes.  All except spherocytes were noted on prior CBCs.  BNP elevated to 649.  INR noted to be elevated to 5.9 with PT 51.  FOBT negative.  Imaging showed chest x-ray with mild interstitial opacity and CT chest showed mild interstitial edema but no other focal disease.  In the ED, patient received a dose of vitamin K, 40 mEq KCl, 1 unit of blood has been ordered.  Review of Systems: As per HPI otherwise all other systems reviewed and are  negative.  Past Medical History:  Diagnosis Date  . Hypertension   . Incidental pulmonary nodule 01/08/2020   Multiple in right lung - need f/u CT 1 year due to h/o smoking  . Mild aortic stenosis   . Mitral valve stenosis, severe   . Moderate mitral regurgitation   . Pneumonia    x 1  . Rheumatic mitral stenosis with regurgitation    Echo 08/08/16:  Severe mitral stenosis (mean gradient 17 mmHg).  Moderate mitral regurgitation.  . 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 Wears glasses   . Wears partial dentures    upper    Past Surgical History:  Procedure Laterality Date  . BILATERAL KNEE ARTHROSCOPY    . BUBBLE STUDY  12/16/2019   Procedure: BUBBLE STUDY;  Surgeon: Elouise Munroe, MD;  Location: [place];  Service: Cardiology;;  . MITRAL VALVE REPLACEMENT Right 03/06/2020   Procedure: MINIMALLY INVASIVE MITRAL VALVE (MV) REPLACEMENT using Carbomedics Optiform 8 MM Mitral Bioprosthetic Valve.;  Surgeon: Rexene Alberts, MD;  Location: [place];  Service: Open Heart Surgery;  Laterality: Right;  . MULTIPLE EXTRACTIONS WITH ALVEOLOPLASTY N/A 12/31/2019   Procedure: Extraction of tooth #'s 16-17, 20-23, and 28-30 with alveoloplasty, Bilateral mandibular tori reductions, and lateral exostoses reductions.;  Surgeon: [name], DDS;  Location: [place];  Service: Oral Surgery;  Laterality: N/A;  . RIGHT/LEFT HEART CATH  AND CORONARY ANGIOGRAPHY N/A 12/16/2019   Procedure: RIGHT/LEFT HEART CATH AND CORONARY ANGIOGRAPHY;  Surgeon: Nelva Bush, MD;  Location: [facility];  Service: Cardiovascular;  Laterality: N/A;  . TEE WITHOUT CARDIOVERSION N/A 12/16/2019   Procedure: TRANSESOPHAGEAL ECHOCARDIOGRAM (TEE);  Surgeon: Elouise Munroe, MD;  Location: [place];  Service: Cardiology;  Laterality: N/A;  . TEE WITHOUT CARDIOVERSION N/A  03/06/2020   Procedure: TRANSESOPHAGEAL ECHOCARDIOGRAM (TEE);  Surgeon: Rexene Alberts, MD;  Location: [facility];  Service: Open Heart Surgery;  Laterality: N/A;    Social History  reports that he quit smoking about 12 months ago. His smoking use included cigarettes. He has a 35.00 pack-year smoking history. He has never used smokeless tobacco. He reports that he does not drink alcohol and does not use drugs.  No Known Allergies  Family History  Problem Relation Age of Onset  . Hypertension Mother   . Hypertension Father   . Stroke Father   Reviewed on admission  Prior to Admission medications   Medication Sig Start Date End Date Taking? Authorizing Provider  amLODipine (NORVASC) 10 MG tablet Take 10 mg by mouth daily.   Yes [provider]  aspirin EC 81 MG tablet Take 1 tablet (81 mg total) by mouth daily. 12/16/19  Yes End, Harrell Gave, MD  furosemide (LASIX) 40 MG tablet Take 1 tablet (40 mg total) by mouth daily for 30 doses. 12/25/20 02/06/2021 Yes Cleaver, Jossie Ng, NP  lisinopril (ZESTRIL) 10 MG tablet Take 1 tablet (10 mg total) by mouth daily - in the evenings 12/07/20  Yes Alma Friendly, MD  nebivolol (BYSTOLIC) 5 MG tablet Take 1 tablet (5 mg total) by mouth daily. 07/01/20  Yes Skeet Latch, MD  warfarin (COUMADIN) 5 MG tablet Take 1 tablet (5 mg total) by mouth daily at 4 PM. Patient taking differently: Take 5-7.5 mg by mouth See admin instructions. Take 1 tablet by mouth on Tues, Thurs, and Sat. All other days take 1 & 1/2 tablet. 07/28/20  Yes Skeet Latch, MD    Physical Exam: Vitals:   01/03/21 2300 01/04/21 0000 01/04/21 0004 01/04/21 0023  BP: 108/62 1_0  Pulse: (!) 52 82 88 88  Resp: 19 (!) _1 Temp:   97.9 F (36.6 C) 98.2 F (36.8 C)  TempSrc:   Oral Oral  SpO2: 95% 97%     Physical Exam Constitutional:      General: He is not in acute distress.    Appearance: Normal appearance.  HENT:     Head: Normocephalic and  atraumatic.     Mouth/Throat:     Mouth: Mucous membranes are moist.     Pharynx: Oropharynx is clear.  Eyes:     Extraocular Movements: Extraocular movements intact.     Pupils: Pupils are equal, round, and reactive to light.  Cardiovascular:     Rate and Rhythm: Normal rate. Rhythm irregular.     Pulses: Normal pulses.     Heart sounds: Normal heart sounds.     Comments: Mechanical click Pulmonary:     Effort: Pulmonary effort is normal. No respiratory distress.     Breath sounds: Normal breath sounds.  Abdominal:     General: Bowel sounds are normal. There is no distension.     Palpations: Abdomen is soft.     Tenderness: There is no abdominal tenderness.  Musculoskeletal:        General: No swelling or deformity.  Skin:  General: Skin is warm and dry.  Neurological:     General: No focal deficit present.     Mental Status: Mental status is at baseline.    Labs on Admission: I have personally reviewed following labs and imaging studies  CBC: Recent Labs  Lab 12/30/20 1617 01/03/21 2010  WBC 13.4* 14.1*  NEUTROABS 8.9* 11.2*  HGB 7.4* 7.0*  HCT 24.5* 24.9*  MCV 86.3 86.2  PLT 335 498*    Basic Metabolic Panel: Recent Labs  Lab 12/30/20 1617 01/03/21 2010  NA 140 137  K 3.4* 3.3*  CL 104 102  CO2 23 20*  GLUCOSE 136* 159*  BUN 15 17  CREATININE 1.03 1.40*  CALCIUM 9.4 9.0    GFR: Estimated Creatinine Clearance: 58.9 mL/min (A) (by C-G formula based on SCr of 1.4 mg/dL (H)).  Liver Function Tests: Recent Labs  Lab 12/30/20 1617 01/03/21 2010  AST 40 43*  ALT 16 21  ALKPHOS 67 65  BILITOT 1.2 1.8*  PROT 7.8 7.3  ALBUMIN 3.7 3.6    Urine analysis:    Component Value Date/Time   COLORURINE YELLOW 12/04/2020 [place] 12/04/2020 0247   LABSPEC 1.012 12/04/2020 0247   PHURINE 5.0 12/04/2020 0247   GLUCOSEU NEGATIVE 12/04/2020 0247   HGBUR MODERATE (A) 12/04/2020 0247   BILIRUBINUR NEGATIVE 12/04/2020 0247   KETONESUR  NEGATIVE 12/04/2020 0247   PROTEINUR 30 (A) 12/04/2020 0247   UROBILINOGEN 0.2 03/11/2013 1135   NITRITE NEGATIVE 12/04/2020 0247   LEUKOCYTESUR NEGATIVE 12/04/2020 0247    Radiological Exams on Admission: DG Chest 2 View  Result Date: 01/03/2021 CLINICAL DATA:  Hemoptysis EXAM: CHEST - 2 VIEW COMPARISON:  12/18/2020 FINDINGS: Unchanged mild diffuse interstitial opacity. Prosthetic valve. Normal cardiomediastinal contours. No pleural effusion or pneumothorax. IMPRESSION: Unchanged mild diffuse interstitial opacity. Electronically Signed   By: Ulyses Jarred M.D.   On: 01/03/2021 21:14   CT Chest W Contrast  Result Date: 01/03/2021 CLINICAL DATA:  Hemoptysis EXAM: CT CHEST WITH CONTRAST TECHNIQUE: Multidetector CT imaging of the chest was performed during intravenous contrast administration. CONTRAST:  22m OMNIPAQUE IOHEXOL 300 MG/ML  SOLN COMPARISON:  Chest x-ray from earlier in the same day. FINDINGS: Cardiovascular: Thoracic aorta and its branches are within normal limits without aneurysmal dilatation or dissection. 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 No cardiac enlargement is noted. The pulmonary artery as visualized is within normal limits. No evidence of pulmonary emboli are noted. Mediastinum/Nodes: No sizable hilar or mediastinal adenopathy is noted. Few scattered small mediastinal nodes are noted stable from the prior study. Thoracic inlet is within normal limits. The esophagus is unremarkable. Lungs/Pleura: Lungs are well aerated bilaterally. Some very mild interstitial density is noted similar to that seen on prior plain film examination consistent with mild edema. No focal confluent infiltrate or sizable effusion is noted. No sizable parenchymal nodules are seen. Mild emphysematous changes are noted. Upper Abdomen: Visualized upper abdomen is within normal limits. Musculoskeletal: No acute bony abnormality is noted. IMPRESSION: Mild changes of interstitial edema. No other focal abnormality is  seen. Aortic Atherosclerosis (ICD10-I70.0) and Emphysema (ICD10-J43.9). Electronically Signed   By: MInez CatalinaM.D.   On: 01/03/2021 23:48   EKG: Independently reviewed.  Sinus rhythm with some supraventricular bigeminy.  Likely LVH.  Some baseline wander.  Assessment/Plan Principal Problem:   Symptomatic anemia Active Problems:   Essential hypertension   Rheumatic mitral stenosis with regurgitation   S/P minimally-invasive mitral valve replacement with mechanical valve   PAF (  paroxysmal atrial fibrillation) (HCC)   Leukocytosis   AKI (acute kidney injury) ([place])   Hypokalemia  Symptomatic anemia > Presenting with weakness and fatigue especially on exertion found to have a hemoglobin around 7. > Of note he was recently seen for leukocytosis by hematology and was noted to have a hemoglobin of 7.4 at that time which was down from 9.42 weeks ago. > A work-up for his anemia has already begun after that outpatient visit.  And is as follows: Negative DAT, normal folate, normal B12, normal ferritin.  Low iron, abnormal multiple myeloma panel with elevated IgA. > As he was seen hematology outpatient may benefit from their input. > He does have chronically schistocytes, polychromasia, target cells on CBCs likely due to destruction from mechanical heart valve. - Monitor on telemetry - Continue with 1 unit transfusion - Trend hemoglobin  AKI Hypokalemia > Creatinine elevated to 1.4 from baseline of around 1.  Potassium 3.3 in ED. > Patient appears euvolemic, possibly little dry. > Received 40 mEq p.o. KCl - May be mildly volume down we will hold off on IV fluids for now given his CHF and the fact that he is receiving blood products - Avoid nephrotoxic agents - Check magnesium - Trend renal function and electrolytes  Paroxysmal A. Fib Supraventricular tachycardia > Noted to have some narrow complex tachycardia in the ED on EKG this is.present and shows only supraventricular bigeminy as  above.  Rhythm strip reviewed shows an example of his intermittent SVT.  He remains asymptomatic from this and it is not persistent. (Image of rhythm strip in media tab as photo) - We will continue to monitor on telemetry and continue beta-blocker starting tomorrow.  We will need to monitor blood pressures carefully as they are chronically soft. - If this rhythm persists may need cardiology input - Repeat EKG in the a.m.  Replete electrolytes as needed  CHF > Patient with a known history of CHF last echo 12/04/2020 with EF 60-65% and indeterminate diastolic parameters, normal RV function. > Was recently admitted for CHF exacerbation from 3/11-3/14.  BNP is elevated at 649 which is higher than his presentation for previous admission. > On exam patient appears euvolemic.  Suspect his elevated BNP is secondary to his intermittent tachycardia and anemia. - Hold home Lasix for now given softer blood pressures - Strict I's/O, daily weights - Continue nebivolol  Leukocytosis > This is chronic and was noted at last admission as well > As above he was referred to hematology for this they did flow cytometry to evaluate for CLL which came back negative per their clinic note. - Continue to monitor  Hypertension > Patient remains on nebivolol.  However his lisinopril has been held outpatient due to low blood pressures. - We will continue nebivolol - Hold amlodipine and lisinopril  Rheumatic heart disease status post mechanical valve - We will continue warfarin per pharmacy after recheck of INR followin partial reversal in ED  DVT prophylaxis: Warfarin  Code Status:   Full  Family Communication:  None on admission Disposition Plan:   Patient is from:  Home  Anticipated DC to:  Home  Anticipated DC date:  1 to 3 days  Anticipated DC barriers: None  Consults called:  None  Admission status:  Observation, telemetry  Severity of Illness: The appropriate patient status for this patient is  OBSERVATION. Observation status is judged to be reasonable and necessary in order to provide the required intensity of service to ensure the patient's safety.  The patient's presenting symptoms, physical exam findings, and initial radiographic and laboratory data in the context of their medical condition is felt to place them at decreased risk for further clinical deterioration. Furthermore, it is anticipated that the patient will be medically stable for discharge from the hospital within 2 midnights of admission. The following factors support the patient status of observation.   " The patient's presenting symptoms include fatigue, weakness, palpitations. " The physical exam findings include irregular heart rhythm. " The initial radiographic and laboratory data are potassium 3.3, creatinine 1.4, WBC 14.1, hemoglobin 7, platelets 498, BNP 649, INR 5.9, chest x-ray with mild interstitial edema but no other focal abnormalities.   Marcelyn Bruins MD [facility]  How to contact the [facility] Attending or Consulting provider [place] or covering provider during after hours [place], for this patient?   1. Check the care team in [facility] and look for a) attending/consulting [facility] provider listed and b) the [facility] team listed 2. Log into [email or web address] and use [place]'s universal password to access. If you do not have the password, please contact the hospital operator. 3. Locate the [facility] provider you are looking for under [facility] and page to a number that you can be directly reached. 4. If you still have difficulty reaching the provider, please page the [facility] (Director on Call) for the Hospitalists listed on amion for assistance.  01/04/2021, 12:39 AM

## 2021-01-04 NOTE — Progress Notes (Addendum)
ANTICOAGULATION CONSULT NOTE - Initial Consult  Pharmacy Consult for warfarin Indication: s/p mitral valve replacement  No Known Allergies   Vital Signs: Temp: 98.2 F (36.8 C) (03/28 0023) Temp Source: Oral (03/28 0023) BP: 97/70 (03/28 0023) Pulse Rate: 88 (03/28 0023)  Labs: Recent Labs    01/03/21 2010  HGB 7.0*  HCT 24.9*  PLT 498*  LABPROT 51.1*  INR 5.9*  CREATININE 1.40*    Estimated Creatinine Clearance: 58.9 mL/min (A) (by C-G formula based on SCr of 1.4 mg/dL (H)).   Medical History: Past Medical History:  Diagnosis Date  . Hypertension   . Incidental pulmonary nodule 01/08/2020   Multiple in right lung - need f/u CT 1 year due to h/o smoking  . Mild aortic stenosis   . Mitral valve stenosis, severe   . Moderate mitral regurgitation   . Pneumonia    x 1  . Rheumatic mitral stenosis with regurgitation    Echo 08/08/16:  Severe mitral stenosis (mean gradient 17 mmHg).  Moderate mitral regurgitation.  . 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 Wears glasses   . Wears partial dentures    upper      Assessment: 55 yo male with hx of rheumatic heart disease s/p mechanical valve on warfarin who presents with fatigue and weakness.  Pharmacy consulted to dose warfarin.  HD 5mg  on Tues, Thurs and Sat, all other days 7.5mg .  LD 3/27 5mg   INR 5.9 Hgb 7 Plts 498 Vit k 2.5mg  po x 1  Goal of Therapy:  INR 2.5-3.5    Plan:  Hold warfarin tonight Daily INR  4/27 RPh 01/04/2021, 12:30 AM

## 2021-01-04 NOTE — ED Notes (Signed)
ED TO INPATIENT HANDOFF REPORT  Name/Age/Gender Melvin Burns 55 y.o. male  Code Status    Code Status Orders  (From admission, onward)         Start     Ordered   01/04/21 0023  Full code  Continuous        01/04/21 0023        Code Status History    Date Active Date Inactive Code Status Order ID Comments User Context   12/18/2020 0911 12/21/2020 2036 Full Code [id number]  Jae Dire, MD Inpatient   12/04/2020 0801 12/07/2020 1843 Full Code [id number]  Jae Dire, MD ED   03/06/2020 1336 03/18/2020 2037 Full Code [id number]  Purcell Nails, MD Inpatient   12/16/2019 1500 12/16/2019 2310 Full Code [id number]  Yvonne Kendall, MD Inpatient   Advance Care Planning Activity      Home/SNF/Other Home   Chief Complaint Symptomatic anemia [D64.9]  Level of Care/Admitting Diagnosis ED Disposition    ED Disposition Condition Comment   Admit  Hospital Area: [facility] [[id number]]  Level of Care: Telemetry [5]  Admit to tele based on following criteria: Other see comments  Comments: History of A. fib, mechanical valve, received vitamin K in ED for partial reversal due to elevated INR, symptomatic anemia  Covid Evaluation: Asymptomatic Screening Protocol (No Symptoms)  Diagnosis: Symptomatic anemia [[id number]]  Admitting Physician: Synetta Fail [[id number]]  Attending Physician: Synetta Fail [[id number]]       Medical History Past Medical History:  Diagnosis Date  . Hypertension   . Incidental pulmonary nodule 01/08/2020   Multiple in right lung - need f/u CT 1 year due to h/o smoking  . Mild aortic stenosis   . Mitral valve stenosis, severe   . Moderate mitral regurgitation   . Pneumonia    x 1  . Rheumatic mitral stenosis with regurgitation    Echo 08/08/16:  Severe mitral stenosis (mean gradient 17 mmHg).  Moderate mitral regurgitation.  . S/P minimally-invasive mitral valve replacement with mechanical valve 03/06/2020   33 mm Sorin  [employer] Optiform bileaflet mechanical valve via right mini thoracotomy approach  . Wears glasses   . Wears partial dentures    upper    Allergies No Known Allergies  IV Location/Drains/Wounds Patient Lines/Drains/Airways Status    Active Line/Drains/Airways    Name Placement date Placement time Site Days   Peripheral IV 12/18/20 Left Antecubital 12/18/20  0524  Antecubital  17   Peripheral IV 01/03/21 Right Antecubital 01/03/21  2030  Antecubital  1   Incision (Closed) 03/06/20 Groin Right 03/06/20  1314  - 304   Incision (Closed) 03/06/20 Chest Right 03/06/20  1314  - 304          Labs/Imaging Results for orders placed or performed during the hospital encounter of 01/03/21 (from the past 48 hour(s))  Comprehensive metabolic panel     Status: Abnormal   Collection Time: 01/03/21  8:10 PM  Result Value Ref Range   Sodium 137 135 - 145 mmol/L   Potassium 3.3 (L) 3.5 - 5.1 mmol/L   Chloride 102 98 - 111 mmol/L   CO2 20 (L) 22 - 32 mmol/L   Glucose, Bld 159 (H) 70 - 99 mg/dL    Comment: Glucose reference range applies only to samples taken after fasting for at least 8 hours.   BUN 17 6 - 20 mg/dL   Creatinine, Ser 7.49 (H) 0.61 - 1.24 mg/dL   Calcium  9.0 8.9 - 10.3 mg/dL   Total Protein 7.3 6.5 - 8.1 g/dL   Albumin 3.6 3.5 - 5.0 g/dL   AST 43 (H) 15 - 41 U/L   ALT 21 0 - 44 U/L   Alkaline Phosphatase 65 38 - 126 U/L   Total Bilirubin 1.8 (H) 0.3 - 1.2 mg/dL   GFR, Estimated 59 (L) >60 mL/min    Comment: (NOTE) Calculated using the CKD-EPI Creatinine Equation (2021)    Anion gap 15 5 - 15    Comment: Performed at [facility], [address] [place], [place] [address]  CBC with Differential     Status: Abnormal   Collection Time: 01/03/21  8:10 PM  Result Value Ref Range   WBC 14.1 (H) 4.0 - 10.5 K/uL   RBC 2.89 (L) 4.22 - 5.81 MIL/uL   Hemoglobin 7.0 (L) 13.0 - 17.0 g/dL   HCT 02.5 (L) 42.7 - 06.2 %   MCV 86.2 80.0 - 100.0 fL   MCH 24.2 (L)  26.0 - 34.0 pg   MCHC 28.1 (L) 30.0 - 36.0 g/dL   RDW 37.6 (H) 28.3 - 15.1 %   Platelets 498 (H) 150 - 400 K/uL   nRBC 1.0 (H) 0.0 - 0.2 %   Neutrophils Relative % 79 %   Neutro Abs 11.2 (H) 1.7 - 7.7 K/uL   Lymphocytes Relative 9 %   Lymphs Abs 1.3 0.7 - 4.0 K/uL   Monocytes Relative 10 %   Monocytes Absolute 1.4 (H) 0.1 - 1.0 K/uL   Eosinophils Relative 0 %   Eosinophils Absolute 0.0 0.0 - 0.5 K/uL   Basophils Relative 1 %   Basophils Absolute 0.1 0.0 - 0.1 K/uL   Immature Granulocytes 1 %   Abs Immature Granulocytes 0.16 (H) 0.00 - 0.07 K/uL   Schistocytes PRESENT    Polychromasia PRESENT    Target Cells PRESENT    Spherocytes PRESENT    Giant PLTs PRESENT     Comment: Performed at [facility] [address] [place], [place] [address]  Brain natriuretic peptide     Status: Abnormal   Collection Time: 01/03/21  8:10 PM  Result Value Ref Range   B Natriuretic Peptide 649.8 (H) 0.0 - 100.0 pg/mL    Comment: Performed at [facility], [address] [place], [place] [address]  Protime-INR     Status: Abnormal   Collection Time: 01/03/21  8:10 PM  Result Value Ref Range   Prothrombin Time 51.1 (H) 11.4 - 15.2 seconds   INR 5.9 (HH) 0.8 - 1.2    Comment: CRITICAL RESULT CALLED TO, READ BACK BY AND VERIFIED WITH: RIVERS, TIJEN,RN @ 2121 01/03/21 BY SJT (NOTE) INR goal varies based on device and disease states. Performed at [facility], [address] [place], [place] [address]   Magnesium     Status: None   Collection Time: 01/03/21  8:10 PM  Result Value Ref Range   Magnesium 2.1 1.7 - 2.4 mg/dL    Comment: Performed at [facility], [address], [place], [place] [address]  CBG monitoring, ED     Status: Abnormal   Collection Time: 01/03/21  8:18 PM  Result Value Ref Range   Glucose-Capillary 164 (H) 70 - 99 mg/dL    Comment: Glucose reference range applies only to samples taken after  fasting for at least 8 hours.  Type and screen     Status: None (Preliminary  result)   Collection Time: 01/03/21  9:10 PM  Result Value Ref Range   ABO/RH(D) O POS    Antibody Screen NEG    Sample Expiration 01/06/2021,2359    Unit Number [id number]    Blood Component Type RBC LR PHER2    Unit division 00    Status of Unit ISSUED    Transfusion Status OK TO TRANSFUSE    Crossmatch Result      Compatible Performed at [facility], [address], [place], [place] [address]   POC occult blood, ED     Status: None   Collection Time: 01/03/21  9:33 PM  Result Value Ref Range   Fecal Occult Bld NEGATIVE NEGATIVE  Prepare RBC (crossmatch)     Status: None   Collection Time: 01/03/21 10:00 PM  Result Value Ref Range   Order Confirmation      ORDER PROCESSED BY BLOOD BANK Performed at [facility], [address], [place], [place] [address]   Reticulocytes     Status: Abnormal   Collection Time: 01/03/21 10:00 PM  Result Value Ref Range   Retic Ct Pct 9.9 (H) 0.4 - 3.1 %   RBC. 2.58 (L) 4.22 - 5.81 MIL/uL   Retic Count, Absolute 254.6 (H) 19.0 - 186.0 K/uL   Immature Retic Fract 41.2 (H) 2.3 - 15.9 %    Comment: Performed at [facility], [address], [place], [place] [address]   DG Chest 2 View  Result Date: 01/03/2021 CLINICAL DATA:  Hemoptysis EXAM: CHEST - 2 VIEW COMPARISON:  12/18/2020 FINDINGS: Unchanged mild diffuse interstitial opacity. Prosthetic valve. Normal cardiomediastinal contours. No pleural effusion or pneumothorax. IMPRESSION: Unchanged mild diffuse interstitial opacity. Electronically Signed   By: Deatra Robinson M.D.   On: 01/03/2021 21:14   CT Chest W Contrast  Result Date: 01/03/2021 CLINICAL DATA:  Hemoptysis EXAM: CT CHEST WITH CONTRAST TECHNIQUE: Multidetector CT imaging of the chest was performed during intravenous contrast administration. CONTRAST:  19mL OMNIPAQUE IOHEXOL 300 MG/ML  SOLN  COMPARISON:  Chest x-ray from earlier in the same day. FINDINGS: Cardiovascular: Thoracic aorta and its branches are within normal limits without aneurysmal dilatation or dissection. 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 No cardiac enlargement is noted. The pulmonary artery as visualized is within normal limits. No evidence of pulmonary emboli are noted. Mediastinum/Nodes: No sizable hilar or mediastinal adenopathy is noted. Few scattered small mediastinal nodes are noted stable from the prior study. Thoracic inlet is within normal limits. The esophagus is unremarkable. Lungs/Pleura: Lungs are well aerated bilaterally. Some very mild interstitial density is noted similar to that seen on prior plain film examination consistent with mild edema. No focal confluent infiltrate or sizable effusion is noted. No sizable parenchymal nodules are seen. Mild emphysematous changes are noted. Upper Abdomen: Visualized upper abdomen is within normal limits. Musculoskeletal: No acute bony abnormality is noted. IMPRESSION: Mild changes of interstitial edema. No other focal abnormality is seen. Aortic Atherosclerosis (ICD10-I70.0) and Emphysema (ICD10-J43.9). Electronically Signed   By: Alcide Clever M.D.   On: 01/03/2021 23:48    Pending Labs Unresulted Labs (From admission, onward)          Start     Ordered   01/05/21 0500  Protime-INR  Daily,   R      01/04/21 0035   01/04/21 0500  Comprehensive metabolic panel  Tomorrow morning,   R        01/04/21 0023   01/04/21 0500  CBC  Tomorrow morning,   R        01/04/21 0023   01/04/21 0500  Protime-INR  Tomorrow morning,   R        01/04/21 0023   01/03/21 2355  SARS CORONAVIRUS 2 (TAT 6-24 HRS) Nasopharyngeal Nasopharyngeal Swab  (Tier 3 - Symptomatic/asymptomatic with Precautions)  Once,   STAT       Question Answer Comment  Is this test for diagnosis or screening Screening   Symptomatic for COVID-19 as defined by CDC No   Hospitalized for COVID-19 No   Admitted to  ICU for COVID-19 No   Previously tested for COVID-19 Unknown   Resident in a congregate (group) care setting No   Employed in healthcare setting No   Has patient completed COVID vaccination(s) (2 doses of Pfizer/Moderna 1 dose of [employer]) No      01/03/21 2354          Vitals/Pain Today's Vitals   01/04/21 0000 01/04/21 0004 01/04/21 0023 01/04/21 0100  BP: 102/65 95/74 97/70  105/70  Pulse: 82 88 88 (!) 46  Resp: (!) 21 14 17 16   Temp:  97.9 F (36.6 C) 98.2 F (36.8 C)   TempSrc:  Oral Oral   SpO2: 97%   97%  PainSc:        Isolation Precautions Airborne and Contact precautions  Medications Medications  nebivolol (BYSTOLIC) tablet 5 mg (has no administration in time range)  sodium chloride flush (NS) 0.9 % injection 3 mL (3 mLs Intravenous Given 01/04/21 0031)  acetaminophen (TYLENOL) tablet 650 mg (has no administration in time range)    Or  acetaminophen (TYLENOL) suppository 650 mg (has no administration in time range)  phytonadione (VITAMIN K) tablet 2.5 mg (2.5 mg Oral Given 01/03/21 2143)  potassium chloride SA (KLOR-CON) CR tablet 40 mEq (40 mEq Oral Given 01/03/21 2142)  0.9 %  sodium chloride infusion (10 mL/hr Intravenous New Bag/Given 01/03/21 2226)  iohexol (OMNIPAQUE) 300 MG/ML solution 75 mL (75 mLs Intravenous Contrast Given 01/03/21 2326)    Mobility: ambualtory

## 2021-01-04 NOTE — Plan of Care (Signed)
  Problem: Education: Goal: Knowledge of General Education information will improve Description Including pain rating scale, medication(s)/side effects and non-pharmacologic comfort measures Outcome: Progressing   

## 2021-01-05 DIAGNOSIS — I509 Heart failure, unspecified: Secondary | ICD-10-CM

## 2021-01-05 LAB — COMPREHENSIVE METABOLIC PANEL
ALT: 30 U/L (ref 0–44)
AST: 38 U/L (ref 15–41)
Albumin: 3.3 g/dL — ABNORMAL LOW (ref 3.5–5.0)
Alkaline Phosphatase: 62 U/L (ref 38–126)
Anion gap: 9 (ref 5–15)
BUN: 16 mg/dL (ref 6–20)
CO2: 22 mmol/L (ref 22–32)
Calcium: 8.8 mg/dL — ABNORMAL LOW (ref 8.9–10.3)
Chloride: 105 mmol/L (ref 98–111)
Creatinine, Ser: 1.07 mg/dL (ref 0.61–1.24)
GFR, Estimated: >60 mL/min (ref >60)
Glucose, Bld: 109 mg/dL — ABNORMAL HIGH (ref 70–99)
Potassium: 3.2 mmol/L — ABNORMAL LOW (ref 3.5–5.1)
Sodium: 136 mmol/L (ref 135–145)
Total Bilirubin: 1.9 mg/dL — ABNORMAL HIGH (ref 0.3–1.2)
Total Protein: 6.8 g/dL (ref 6.5–8.1)

## 2021-01-05 LAB — CBC
HCT: 26 % — ABNORMAL LOW (ref 39.0–52.0)
Hemoglobin: 7.8 g/dL — ABNORMAL LOW (ref 13.0–17.0)
MCH: 25.4 pg — ABNORMAL LOW (ref 26.0–34.0)
MCHC: 30 g/dL (ref 30.0–36.0)
MCV: 84.7 fL (ref 80.0–100.0)
Platelets: 413 10*3/uL — ABNORMAL HIGH (ref 150–400)
RBC: 3.07 MIL/uL — ABNORMAL LOW (ref 4.22–5.81)
RDW: 20.8 % — ABNORMAL HIGH (ref 11.5–15.5)
WBC: 15.9 10*3/uL — ABNORMAL HIGH (ref 4.0–10.5)
nRBC: 0.3 % — ABNORMAL HIGH (ref 0.0–0.2)

## 2021-01-05 LAB — PROTIME-INR
INR: 2.2 — ABNORMAL HIGH (ref 0.8–1.2)
Prothrombin Time: 23.6 seconds — ABNORMAL HIGH (ref 11.4–15.2)

## 2021-01-05 MED ORDER — WARFARIN - PHARMACIST DOSING INPATIENT: Freq: Every day | Status: DC

## 2021-01-05 MED ORDER — DOCUSATE SODIUM 100 MG PO CAPS
100.0000 mg | ORAL_CAPSULE | Freq: Every day | ORAL | Status: DC
  Administered 2021-01-05 – 2021-01-08 (×4): 100 mg via ORAL
  Filled 2021-01-05 (×4): qty 1

## 2021-01-05 MED ORDER — POTASSIUM CHLORIDE CRYS ER 20 MEQ PO TBCR
60.0000 meq | EXTENDED_RELEASE_TABLET | Freq: Once | ORAL | Status: AC
  Administered 2021-01-05: 60 meq via ORAL
  Filled 2021-01-05: qty 3

## 2021-01-05 MED ORDER — WARFARIN SODIUM 7.5 MG PO TABS
7.5000 mg | ORAL_TABLET | Freq: Once | ORAL | Status: AC
  Administered 2021-01-05: 7.5 mg via ORAL
  Filled 2021-01-05: qty 1

## 2021-01-05 MED ORDER — POLYETHYLENE GLYCOL 3350 17 G PO PACK
17.0000 g | PACK | Freq: Every day | ORAL | Status: DC | PRN
  Administered 2021-01-05: 17 g via ORAL
  Filled 2021-01-05: qty 1

## 2021-01-05 MED ORDER — ENOXAPARIN SODIUM 80 MG/0.8ML ~~LOC~~ SOLN
1.0000 mg/kg | Freq: Two times a day (BID) | SUBCUTANEOUS | Status: DC
  Administered 2021-01-05 – 2021-01-07 (×6): 75 mg via SUBCUTANEOUS
  Filled 2021-01-05 (×6): qty 0.8

## 2021-01-05 NOTE — Progress Notes (Incomplete)
  HEMATOLOGY-ONCOLOGY TELEPHONE VISIT PROGRESS NOTE  I connected with [name] on 01/05/2021 at  8:15 AM EDT by telephone and verified that I am speaking with the correct person using two identifiers.  I discussed the limitations, risks, security and privacy concerns of performing an evaluation and management service by telephone and the availability of in person appointments.  I also discussed with the patient that there may be a patient responsible charge related to this service. The patient expressed understanding and agreed to proceed.   History of Present Illness: Melvin Burns is a 55 y.o. male with above-mentioned history of leukocytosis. He was admitted to [facility] on 01/03/21 after presenting to the ED for weakness, fatigue, and hemoptysis. He presents over the phone today for follow-up.    Observations/Objective:     Assessment Plan:  No problem-specific Assessment & Plan notes found for this encounter.    I discussed the assessment and treatment plan with the patient. The patient was provided an opportunity to ask questions and all were answered. The patient agreed with the plan and demonstrated an understanding of the instructions. The patient was advised to call back or seek an in-person evaluation if the symptoms worsen or if the condition fails to improve as anticipated.   Total time spent: *** mins including non-face to face time and time spent for planning, charting and coordination of care  Sabas Sous, MD 01/05/2021    I, Kirt Boys Dorshimer, am acting as scribe for Serena Croissant, MD.  {Add scribe attestation statement}

## 2021-01-05 NOTE — Progress Notes (Signed)
PROGRESS NOTE    Delmore M. Riese  EHU:[id number] DOB: 06/24/1966 DOA: 01/03/2021 PCP: Chilton Si, MD    Brief Narrative:  55 y.o. 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 fib, COVID-19 infection who presents with 3 days of fatigue and weakness.  As above patient has had fatigue and weakness for the past few days especially on exertion such as walking.  He also reports a call since he was discharged from an admission for CHF from 311 2 3/14.  He reports coughing up some specks of blood for the past week.  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 > Of note he was recently seen for leukocytosis by hematology and was noted to have a hemoglobin of 7.4 at that time which was down from 9.42 weeks ago.  > Pt did receive 1 unit of PRBC transfusion with appropriate correction of hgb.  -Hgb remains stable -No evidence of acute blood loss. Would have pt f/u with Hematology as outpatient when d/c  AKI Hypokalemia >Potassium remains low, will replace -repeat bmet in AM, cont to correct as tolerated -Renal function remains stable with diuresis  Paroxysmal A. Fib Supraventricular tachycardia - Continue beta-blocker as tolerated -Pt noted to have bouts of tachycardia -Given relatively soft BP, would hold off on increasing BB dose and will  monitor for now - continue on tele  Acute on chronic diastolic CHF > Patient with a known history of CHF last echo 12/04/2020 with EF 60-65% and indeterminate diastolic parameters, normal RV function. > Was recently admitted for CHF exacerbation from 3/11-3/14.  BNP is elevated at 649 which is higher than his presentation for previous admission which was over 100 > CT chest reviewed, appears wet -Clinically improving and tolerating IV lasix. Will continue -Pt reports still feeling sob with decreased exercise tolerance, not yet at baseline. Would continue IV lasix  Leukocytosis > This is chronic and was noted at last admission as well > As above he was referred to hematology for this they did flow cytometry to evaluate for CLL which came back negative per their clinic note. - Recheck cbc in AM  Hypertension > Patient remains on nebivolol.  However his lisinopril has been held outpatient due to low blood pressures. -continued on nebivolol - Held amlodipine and lisinopril given soft BP  Rheumatic heart disease status post mechanical valve - We will continue warfarin per pharmacy after recheck of INR followin partial reversal in ED -Pt was given vitamin k at time of presentation -INR is now subtherapeutic at 2.2. Now on Lovenox bridge.  -Goal INR of 2.5 to 3.5  DVT prophylaxis: Coumadin Code Status: Full Family Communication: Pt in room, family not at bedside  Status is: Inpatient  Requires continued inpatient stay because: IV treatments appropriate due to intensity of illness or inability to take PO and Inpatient level of care appropriate due to severity of illness  Dispo: The patient is from: Home  Anticipated d/c is to: Home              Patient currently is not medically stable to d/c.   Difficult to place patient No       Consultants:     Procedures:     Antimicrobials: Anti-infectives (From admission, onward)   None      Subjective: Reports  feeling better but is still sob on ambulating, not yet at baseline  Objective: Vitals:   01/04/21 1700 01/04/21 2113 01/05/21 0103 01/05/21 1413  BP:  (!) 95/58 108/71 101/74  Pulse:  79 83 78  Resp: 16 18 18 18   Temp:  98.7 F (37.1 C) 98.9 F (37.2 C) 99.3 F (37.4 C)  TempSrc:  Oral Oral Oral  SpO2:  90% 91% 94%  Weight:      Height:        Intake/Output Summary (Last 24 hours) at 01/05/2021 1521 Last data filed at 01/05/2021 1300 Gross per 24 hour  Intake 963 ml  Output 250 ml  Net 713 ml   Filed Weights   01/04/21 0206  Weight: 75.8 kg    Examination: General exam: Conversant, in no acute distress Respiratory system: normal chest rise, clear, no audible wheezing Cardiovascular system: regular rhythm, s1-s2 Gastrointestinal system: Nondistended, nontender, pos BS Central nervous system: No seizures, no tremors Extremities: No cyanosis, no joint deformities Skin: No rashes, no pallor Psychiatry: Affect normal // no auditory hallucinations   Data Reviewed: I have personally reviewed following labs and imaging studies  CBC: Recent Labs  Lab 12/30/20 1617 01/03/21 2010 01/04/21 0548 01/05/21 0338  WBC 13.4* 14.1* 14.5* 15.9*  NEUTROABS 8.9* 11.2*  --   --   HGB 7.4* 7.0* 8.0* 7.8*  HCT 24.5* 24.9* 26.8* 26.0*  MCV 86.3 86.2 85.4 84.7  PLT 335 498* 428* 413*   Basic Metabolic Panel: Recent Labs  Lab 12/30/20 1617 01/03/21 2010 01/04/21 0548 01/05/21 0338  NA 140 137 136 136  K 3.4* 3.3* 3.6 3.2*  CL 104 102 105 105  CO2 23 20* 21* 22  GLUCOSE 136* 159* 146* 109*  BUN 15 17 17 16   CREATININE 1.03 1.40* 1.08 1.07  CALCIUM 9.4 9.0 8.9 8.8*  MG  --  2.1  --   --    GFR: Estimated Creatinine Clearance: 77.1 mL/min (by C-G formula based on SCr of 1.07 mg/dL). Liver Function Tests: Recent Labs  Lab 12/30/20 1617 01/03/21 2010 01/04/21 0548 01/05/21 0338  AST 40 43* 47* 38  ALT 16 21 27 30   ALKPHOS 67 65 64 62  BILITOT 1.2 1.8* 1.4* 1.9*   PROT 7.8 7.3 6.9 6.8  ALBUMIN 3.7 3.6 3.3* 3.3*   No results for input(s): LIPASE, AMYLASE in the last 168 hours. No results for input(s): AMMONIA in the last 168 hours. Coagulation Profile: Recent Labs  Lab 01/03/21 2010 01/04/21 0548 01/05/21 0338  INR 5.9* 4.3* 2.2*   Cardiac Enzymes: No results for input(s): CKTOTAL, CKMB, CKMBINDEX, TROPONINI in the last 168 hours. BNP (last 3 results) No results for input(s): PROBNP in the last 8760 hours. HbA1C: No results for input(s): HGBA1C in the last 72 hours. CBG: Recent Labs  Lab 01/03/21 2018  GLUCAP 164*   Lipid Profile: No results for input(s): CHOL, HDL, LDLCALC, TRIG, CHOLHDL, LDLDIRECT in the last 72 hours. Thyroid Function Tests: No results for input(s): TSH, T4TOTAL, FREET4, T3FREE, THYROIDAB in the last 72 hours. Anemia Panel: Recent Labs    01/03/21 2200  RETICCTPCT 9.9*   Sepsis Labs: No results for input(s): PROCALCITON, LATICACIDVEN in the last 168 hours.  Recent Results (from the past 240 hour(s))  SARS CORONAVIRUS 2 (TAT 6-24 HRS) Nasopharyngeal Nasopharyngeal Swab     Status: None   Collection Time: 01/04/21 12:34 AM   Specimen: Nasopharyngeal Swab  Result Value Ref Range Status   SARS Coronavirus 2 NEGATIVE NEGATIVE Final    Comment: (NOTE) SARS-CoV-2 target nucleic acids are NOT DETECTED.  The SARS-CoV-2 RNA is generally detectable in upper and lower respiratory specimens during the acute phase of infection. Negative results do not preclude SARS-CoV-2 infection, do not rule out co-infections with other pathogens, and should not be used as the sole basis for treatment or other patient management decisions. Negative results must be combined with clinical observations, patient history, and epidemiological information. The expected result is Negative.  Fact Sheet for Patients: HairSlick.no  Fact Sheet for Healthcare  Providers: quierodirigir.com  This test is not yet approved or cleared by the [place] FDA and  has been authorized for detection and/or diagnosis of SARS-CoV-2 by FDA under an Emergency Use Authorization (EUA). This EUA will remain  in effect (meaning this test can be used) for the duration of the COVID-19 declaration under Se ction 564(b)(1) of the Act, 21 U.S.C. section 360bbb-3(b)(1), unless the authorization is terminated or revoked sooner.  Performed at [facility], [address] [place], [place] [address]      Radiology Studies: DG Chest 2 View  Result Date: 01/03/2021 CLINICAL DATA:  Hemoptysis EXAM: CHEST - 2 VIEW COMPARISON:  12/18/2020 FINDINGS: Unchanged mild diffuse interstitial opacity. Prosthetic valve. Normal cardiomediastinal contours. No pleural effusion or pneumothorax. IMPRESSION: Unchanged mild diffuse interstitial opacity. Electronically Signed   By: Deatra Robinson M.D.   On: 01/03/2021 21:14   CT Chest W Contrast  Result Date: 01/03/2021 CLINICAL DATA:  Hemoptysis EXAM: CT CHEST WITH CONTRAST TECHNIQUE: Multidetector CT imaging of the chest was performed during intravenous contrast administration. CONTRAST:  42mL OMNIPAQUE IOHEXOL 300 MG/ML  SOLN COMPARISON:  Chest x-ray from earlier in the same day. FINDINGS: Cardiovascular: Thoracic aorta and its branches are within normal limits without aneurysmal dilatation or dissection. 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 No cardiac enlargement is noted. The pulmonary artery as visualized is within normal limits. No evidence of pulmonary emboli are noted. Mediastinum/Nodes: No sizable hilar or mediastinal adenopathy is noted. Few scattered small mediastinal nodes are noted stable from the prior study. Thoracic inlet is within normal limits. The esophagus is unremarkable. Lungs/Pleura: Lungs are well aerated bilaterally. Some very mild interstitial density is noted similar to that seen on prior  plain film examination consistent with mild edema. No focal confluent infiltrate or sizable effusion is noted. No sizable parenchymal nodules are seen. Mild emphysematous changes are noted. Upper Abdomen: Visualized upper abdomen is within normal limits. Musculoskeletal: No acute bony abnormality is noted. IMPRESSION: Mild changes of interstitial edema. No other focal abnormality is seen. Aortic Atherosclerosis (ICD10-I70.0) and Emphysema (ICD10-J43.9). Electronically Signed   By: Alcide Clever M.D.   On: 01/03/2021 23:48    Scheduled Meds: . docusate sodium  100 mg Oral Daily  . enoxaparin (LOVENOX) injection  1 mg/kg Subcutaneous Q12H  . furosemide  40 mg Intravenous Daily  . nebivolol  5 mg Oral Daily  . sodium chloride flush  3 mL Intravenous Q12H  . Warfarin - Pharmacist Dosing Inpatient   Does not apply q1600   Continuous Infusions:   LOS: 1 day  Rickey Barbara, MD [facility] Pager On Amion  If 7PM-7AM, please contact night-coverage 01/05/2021, 3:21 PM

## 2021-01-05 NOTE — Progress Notes (Signed)
ANTICOAGULATION CONSULT NOTE  Pharmacy Consult for warfarin (+ lovenox) Indication: s/p mechanical mitral valve replacement, PAF  No Known Allergies   Vital Signs: Temp: 98.9 F (37.2 C) (03/29 0103) Temp Source: Oral (03/29 0103) BP: 108/71 (03/29 0103) Pulse Rate: 83 (03/29 0103)  Labs: Recent Labs    01/03/21 2010 01/04/21 0548 01/05/21 0338  HGB 7.0* 8.0* 7.8*  HCT 24.9* 26.8* 26.0*  PLT 498* 428* 413*  LABPROT 51.1* 39.6* 23.6*  INR 5.9* 4.3* 2.2*  CREATININE 1.40* 1.08 1.07    Estimated Creatinine Clearance: 77.1 mL/min (by C-G formula based on SCr of 1.07 mg/dL).   Medical History: Past Medical History:  Diagnosis Date  . Hypertension   . Incidental pulmonary nodule 01/08/2020   Multiple in right lung - need f/u CT 1 year due to h/o smoking  . Mild aortic stenosis   . Mitral valve stenosis, severe   . Moderate mitral regurgitation   . Pneumonia    x 1  . Rheumatic mitral stenosis with regurgitation    Echo 08/08/16:  Severe mitral stenosis (mean gradient 17 mmHg).  Moderate mitral regurgitation.  . 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 Wears glasses   . Wears partial dentures    upper     Assessment: 55 yo male with PMH of rheumatic heart disease s/p mechanical mitral valve and PAF on warfarin PTA who presents with fatigue, weakness, anemia. INR supratherapeutic at 5.9 on admission. Patient was given Vitamin K 2.5mg  PO x 1 on 3/27 at 2143. Also received 1 unit PRBCs for Hgb 7. Pharmacy consulted to dose warfarin while patient in the hospital. Home dose is 5mg  on Tues, Thurs, Sat, and 7.5mg  on all other days of the week - last dose 3/27.   Today, 01/05/21:  INR down to 2.2 today, now subtherapeutic  CBC: Hgb 7.8, Pltc elevated at 413K  No bleeding issues per nursing  SCr 1.07 with CrCl ~ 77 ml/min   Goal of  Therapy:  INR 2.5-3.5    Plan:  Per MD, add Enoxaparin for bridging now that INR subtherapeutic. Enoxaparin 1mg /kg SQ q12h. Warfarin 7.5mg  PO x 1 Daily PT/INR Monitor CBC and for s/sx of bleeding    01/07/21, PharmD, BCPS Clinical Pharmacist 01/05/2021, 10:44 AM

## 2021-01-05 NOTE — Progress Notes (Signed)
PROGRESS NOTE    Melvin Burns  EHU:[id number] DOB: 06/24/1966 DOA: 01/03/2021 PCP: Chilton Si, MD    Brief Narrative:  55 y.o. 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 fib, COVID-19 infection who presents with 3 days of fatigue and weakness.  As above patient has had fatigue and weakness for the past few days especially on exertion such as walking.  He also reports a call since he was discharged from an admission for CHF from 311 2 3/14.  He reports coughing up some specks of blood for the past week.  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 > Of note he was recently seen for leukocytosis by hematology and was noted to have a hemoglobin of 7.4 at that time which was down from 9.42 weeks ago.  > Pt did receive 1 unit of PRBC transfusion with appropriate correction of hgb.  -Hgb remains stable -No evidence of acute blood loss. Would have pt f/u with Hematology as outpatient when d/c  AKI Hypokalemia >Potassium remains low, will replace -repeat bmet in AM, cont to correct as tolerated -Renal function remains stable with diuresis  Paroxysmal A. Fib Supraventricular tachycardia - Continue beta-blocker as tolerated -Pt noted to have bouts of tachycardia -Given relatively soft BP, would hold off on increasing BB dose and will  monitor for now - continue on tele  Acute on chronic diastolic CHF > Patient with a known history of CHF last echo 12/04/2020 with EF 60-65% and indeterminate diastolic parameters, normal RV function. > Was recently admitted for CHF exacerbation from 3/11-3/14.  BNP is elevated at 649 which is higher than his presentation for previous admission which was over 100 > CT chest reviewed, appears wet -Clinically improving and tolerating IV lasix. Will continue -Pt reports still feeling sob with decreased exercise tolerance, not yet at baseline. Would continue IV lasix  Leukocytosis > This is chronic and was noted at last admission as well > As above he was referred to hematology for this they did flow cytometry to evaluate for CLL which came back negative per their clinic note. - Recheck cbc in AM  Hypertension > Patient remains on nebivolol.  However his lisinopril has been held outpatient due to low blood pressures. -continued on nebivolol - Held amlodipine and lisinopril given soft BP  Rheumatic heart disease status post mechanical valve - We will continue warfarin per pharmacy after recheck of INR followin partial reversal in ED -Pt was given vitamin k at time of presentation -INR is now subtherapeutic at 2.2. Now on Lovenox bridge.  -Goal INR of 2.5 to 3.5  DVT prophylaxis: Coumadin Code Status: Full Family Communication: Pt in room, family not at bedside  Status is: Inpatient  Requires continued inpatient stay because: IV treatments appropriate due to intensity of illness or inability to take PO and Inpatient level of care appropriate due to severity of illness  Dispo: The patient is from: Home  Anticipated d/c is to: Home              Patient currently is not medically stable to d/c.   Difficult to place patient No       Consultants:     Procedures:     Antimicrobials: Anti-infectives (From admission, onward)   None      Subjective: Reports  feeling better but is still sob on ambulating, not yet at baseline  Objective: Vitals:   01/04/21 1700 01/04/21 2113 01/05/21 0103 01/05/21 1413  BP:  (!) 95/58 108/71 101/74  Pulse:  79 83 78  Resp: 16 18 18 18   Temp:  98.7 F (37.1 C) 98.9 F (37.2 C) 99.3 F (37.4 C)  TempSrc:  Oral Oral Oral  SpO2:  90% 91% 94%  Weight:      Height:        Intake/Output Summary (Last 24 hours) at 01/05/2021 1455 Last data filed at 01/05/2021 1300 Gross per 24 hour  Intake 963 ml  Output 250 ml  Net 713 ml   Filed Weights   01/04/21 0206  Weight: 75.8 kg    Examination: General exam: Conversant, in no acute distress Respiratory system: normal chest rise, clear, no audible wheezing Cardiovascular system: regular rhythm, s1-s2 Gastrointestinal system: Nondistended, nontender, pos BS Central nervous system: No seizures, no tremors Extremities: No cyanosis, no joint deformities Skin: No rashes, no pallor Psychiatry: Affect normal // no auditory hallucinations   Data Reviewed: I have personally reviewed following labs and imaging studies  CBC: Recent Labs  Lab 12/30/20 1617 01/03/21 2010 01/04/21 0548 01/05/21 0338  WBC 13.4* 14.1* 14.5* 15.9*  NEUTROABS 8.9* 11.2*  --   --   HGB 7.4* 7.0* 8.0* 7.8*  HCT 24.5* 24.9* 26.8* 26.0*  MCV 86.3 86.2 85.4 84.7  PLT 335 498* 428* 413*   Basic Metabolic Panel: Recent Labs  Lab 12/30/20 1617 01/03/21 2010 01/04/21 0548 01/05/21 0338  NA 140 137 136 136  K 3.4* 3.3* 3.6 3.2*  CL 104 102 105 105  CO2 23 20* 21* 22  GLUCOSE 136* 159* 146* 109*  BUN 15 17 17 16   CREATININE 1.03 1.40* 1.08 1.07  CALCIUM 9.4 9.0 8.9 8.8*  MG  --  2.1  --   --    GFR: Estimated Creatinine Clearance: 77.1 mL/min (by C-G formula based on SCr of 1.07 mg/dL). Liver Function Tests: Recent Labs  Lab 12/30/20 1617 01/03/21 2010 01/04/21 0548 01/05/21 0338  AST 40 43* 47* 38  ALT 16 21 27 30   ALKPHOS 67 65 64 62  BILITOT 1.2 1.8* 1.4* 1.9*   PROT 7.8 7.3 6.9 6.8  ALBUMIN 3.7 3.6 3.3* 3.3*   No results for input(s): LIPASE, AMYLASE in the last 168 hours. No results for input(s): AMMONIA in the last 168 hours. Coagulation Profile: Recent Labs  Lab 01/03/21 2010 01/04/21 0548 01/05/21 0338  INR 5.9* 4.3* 2.2*   Cardiac Enzymes: No results for input(s): CKTOTAL, CKMB, CKMBINDEX, TROPONINI in the last 168 hours. BNP (last 3 results) No results for input(s): PROBNP in the last 8760 hours. HbA1C: No results for input(s): HGBA1C in the last 72 hours. CBG: Recent Labs  Lab 01/03/21 2018  GLUCAP 164*   Lipid Profile: No results for input(s): CHOL, HDL, LDLCALC, TRIG, CHOLHDL, LDLDIRECT in the last 72 hours. Thyroid Function Tests: No results for input(s): TSH, T4TOTAL, FREET4, T3FREE, THYROIDAB in the last 72 hours. Anemia Panel: Recent Labs    01/03/21 2200  RETICCTPCT 9.9*   Sepsis Labs: No results for input(s): PROCALCITON, LATICACIDVEN in the last 168 hours.  Recent Results (from the past 240 hour(s))  SARS CORONAVIRUS 2 (TAT 6-24 HRS) Nasopharyngeal Nasopharyngeal Swab     Status: None   Collection Time: 01/04/21 12:34 AM   Specimen: Nasopharyngeal Swab  Result Value Ref Range Status   SARS Coronavirus 2 NEGATIVE NEGATIVE Final    Comment: (NOTE) SARS-CoV-2 target nucleic acids are NOT DETECTED.  The SARS-CoV-2 RNA is generally detectable in upper and lower respiratory specimens during the acute phase of infection. Negative results do not preclude SARS-CoV-2 infection, do not rule out co-infections with other pathogens, and should not be used as the sole basis for treatment or other patient management decisions. Negative results must be combined with clinical observations, patient history, and epidemiological information. The expected result is Negative.  Fact Sheet for Patients: HairSlick.no  Fact Sheet for Healthcare  Providers: quierodirigir.com  This test is not yet approved or cleared by the [place] FDA and  has been authorized for detection and/or diagnosis of SARS-CoV-2 by FDA under an Emergency Use Authorization (EUA). This EUA will remain  in effect (meaning this test can be used) for the duration of the COVID-19 declaration under Se ction 564(b)(1) of the Act, 21 U.S.C. section 360bbb-3(b)(1), unless the authorization is terminated or revoked sooner.  Performed at [facility], [address] [place], [place] [address]      Radiology Studies: DG Chest 2 View  Result Date: 01/03/2021 CLINICAL DATA:  Hemoptysis EXAM: CHEST - 2 VIEW COMPARISON:  12/18/2020 FINDINGS: Unchanged mild diffuse interstitial opacity. Prosthetic valve. Normal cardiomediastinal contours. No pleural effusion or pneumothorax. IMPRESSION: Unchanged mild diffuse interstitial opacity. Electronically Signed   By: Deatra Robinson M.D.   On: 01/03/2021 21:14   CT Chest W Contrast  Result Date: 01/03/2021 CLINICAL DATA:  Hemoptysis EXAM: CT CHEST WITH CONTRAST TECHNIQUE: Multidetector CT imaging of the chest was performed during intravenous contrast administration. CONTRAST:  42mL OMNIPAQUE IOHEXOL 300 MG/ML  SOLN COMPARISON:  Chest x-ray from earlier in the same day. FINDINGS: Cardiovascular: Thoracic aorta and its branches are within normal limits without aneurysmal dilatation or dissection. 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 No cardiac enlargement is noted. The pulmonary artery as visualized is within normal limits. No evidence of pulmonary emboli are noted. Mediastinum/Nodes: No sizable hilar or mediastinal adenopathy is noted. Few scattered small mediastinal nodes are noted stable from the prior study. Thoracic inlet is within normal limits. The esophagus is unremarkable. Lungs/Pleura: Lungs are well aerated bilaterally. Some very mild interstitial density is noted similar to that seen on prior  plain film examination consistent with mild edema. No focal confluent infiltrate or sizable effusion is noted. No sizable parenchymal nodules are seen. Mild emphysematous changes are noted. Upper Abdomen: Visualized upper abdomen is within normal limits. Musculoskeletal: No acute bony abnormality is noted. IMPRESSION: Mild changes of interstitial edema. No other focal abnormality is seen. Aortic Atherosclerosis (ICD10-I70.0) and Emphysema (ICD10-J43.9). Electronically Signed   By: Alcide Clever M.D.   On: 01/03/2021 23:48    Scheduled Meds: . docusate sodium  100 mg Oral Daily  . enoxaparin (LOVENOX) injection  1 mg/kg Subcutaneous Q12H  . furosemide  40 mg Intravenous Daily  . nebivolol  5 mg Oral Daily  . sodium chloride flush  3 mL Intravenous Q12H  . Warfarin - Pharmacist Dosing Inpatient   Does not apply q1600   Continuous Infusions:   LOS: 1 day  Rickey Barbara, MD [facility] Pager On Amion  If 7PM-7AM, please contact night-coverage 01/05/2021, 2:55 PM

## 2021-01-06 ENCOUNTER — Inpatient Hospital Stay: Payer: 59 | Admitting: Hematology and Oncology

## 2021-01-06 DIAGNOSIS — Z954 Presence of other heart-valve replacement: Secondary | ICD-10-CM

## 2021-01-06 DIAGNOSIS — N179 Acute kidney failure, unspecified: Secondary | ICD-10-CM

## 2021-01-06 DIAGNOSIS — I5031 Acute diastolic (congestive) heart failure: Secondary | ICD-10-CM

## 2021-01-06 DIAGNOSIS — I499 Cardiac arrhythmia, unspecified: Secondary | ICD-10-CM

## 2021-01-06 DIAGNOSIS — E876 Hypokalemia: Secondary | ICD-10-CM

## 2021-01-06 DIAGNOSIS — I052 Rheumatic mitral stenosis with insufficiency: Secondary | ICD-10-CM

## 2021-01-06 DIAGNOSIS — I480 Paroxysmal atrial fibrillation: Secondary | ICD-10-CM

## 2021-01-06 DIAGNOSIS — D72829 Elevated white blood cell count, unspecified: Secondary | ICD-10-CM

## 2021-01-06 DIAGNOSIS — D649 Anemia, unspecified: Secondary | ICD-10-CM

## 2021-01-06 DIAGNOSIS — I10 Essential (primary) hypertension: Secondary | ICD-10-CM

## 2021-01-06 LAB — CBC
HCT: 26.3 % — ABNORMAL LOW (ref 39.0–52.0)
Hemoglobin: 7.9 g/dL — ABNORMAL LOW (ref 13.0–17.0)
MCH: 24.8 pg — ABNORMAL LOW (ref 26.0–34.0)
MCHC: 30 g/dL (ref 30.0–36.0)
MCV: 82.7 fL (ref 80.0–100.0)
Platelets: 399 10*3/uL (ref 150–400)
RBC: 3.18 MIL/uL — ABNORMAL LOW (ref 4.22–5.81)
RDW: 21.5 % — ABNORMAL HIGH (ref 11.5–15.5)
WBC: 16.1 10*3/uL — ABNORMAL HIGH (ref 4.0–10.5)
nRBC: 0.2 % (ref 0.0–0.2)

## 2021-01-06 LAB — COMPREHENSIVE METABOLIC PANEL
ALT: 34 U/L (ref 0–44)
AST: 37 U/L (ref 15–41)
Albumin: 3.2 g/dL — ABNORMAL LOW (ref 3.5–5.0)
Alkaline Phosphatase: 69 U/L (ref 38–126)
Anion gap: 10 (ref 5–15)
BUN: 18 mg/dL (ref 6–20)
CO2: 22 mmol/L (ref 22–32)
Calcium: 9 mg/dL (ref 8.9–10.3)
Chloride: 106 mmol/L (ref 98–111)
Creatinine, Ser: 1.03 mg/dL (ref 0.61–1.24)
GFR, Estimated: >60 mL/min (ref >60)
Glucose, Bld: 103 mg/dL — ABNORMAL HIGH (ref 70–99)
Potassium: 3.7 mmol/L (ref 3.5–5.1)
Sodium: 138 mmol/L (ref 135–145)
Total Bilirubin: 1.5 mg/dL — ABNORMAL HIGH (ref 0.3–1.2)
Total Protein: 6.8 g/dL (ref 6.5–8.1)

## 2021-01-06 LAB — PROTIME-INR
INR: 2.2 — ABNORMAL HIGH (ref 0.8–1.2)
Prothrombin Time: 23.7 seconds — ABNORMAL HIGH (ref 11.4–15.2)

## 2021-01-06 LAB — BILIRUBIN, FRACTIONATED(TOT/DIR/INDIR)
Bilirubin, Direct: 0.2 mg/dL (ref 0.0–0.2)
Indirect Bilirubin: 1 mg/dL — ABNORMAL HIGH (ref 0.3–0.9)
Total Bilirubin: 1.2 mg/dL (ref 0.3–1.2)

## 2021-01-06 LAB — MAGNESIUM: Magnesium: 2.1 mg/dL (ref 1.7–2.4)

## 2021-01-06 LAB — LACTATE DEHYDROGENASE: LDH: 956 U/L — ABNORMAL HIGH (ref 98–192)

## 2021-01-06 MED ORDER — LEVALBUTEROL HCL 0.63 MG/3ML IN NEBU: 0.6300 mg | INHALATION_SOLUTION | Freq: Four times a day (QID) | RESPIRATORY_TRACT | Status: DC | PRN

## 2021-01-06 MED ORDER — WARFARIN SODIUM 7.5 MG PO TABS
7.5000 mg | ORAL_TABLET | Freq: Once | ORAL | Status: AC
  Administered 2021-01-06: 7.5 mg via ORAL
  Filled 2021-01-06: qty 1

## 2021-01-06 MED ORDER — IPRATROPIUM BROMIDE 0.02 % IN SOLN: 0.5000 mg | Freq: Four times a day (QID) | RESPIRATORY_TRACT | Status: DC | PRN

## 2021-01-06 MED ORDER — FUROSEMIDE 40 MG PO TABS
40.0000 mg | ORAL_TABLET | Freq: Every day | ORAL | Status: DC
  Administered 2021-01-07 – 2021-01-08 (×2): 40 mg via ORAL
  Filled 2021-01-06 (×2): qty 1

## 2021-01-06 MED ORDER — POTASSIUM CHLORIDE CRYS ER 20 MEQ PO TBCR
40.0000 meq | EXTENDED_RELEASE_TABLET | Freq: Once | ORAL | Status: AC
  Administered 2021-01-06: 40 meq via ORAL
  Filled 2021-01-06: qty 2

## 2021-01-06 NOTE — Progress Notes (Signed)
ANTICOAGULATION CONSULT NOTE  Pharmacy Consult for warfarin (+ lovenox) Indication: s/p mechanical mitral valve replacement, PAF  No Known Allergies   Vital Signs: Temp: 99 F (37.2 C) (03/30 0425) Temp Source: Oral (03/30 0425) BP: 112/78 (03/30 0849) Pulse Rate: 81 (03/30 0849)  Labs: Recent Labs    01/04/21 0548 01/05/21 0338 01/06/21 0342  HGB 8.0* 7.8* 7.9*  HCT 26.8* 26.0* 26.3*  PLT 428* 413* 399  LABPROT 39.6* 23.6* 23.7*  INR 4.3* 2.2* 2.2*  CREATININE 1.08 1.07 1.03    Estimated Creatinine Clearance: 80.1 mL/min (by C-G formula based on SCr of 1.03 mg/dL).   Medical History: Past Medical History:  Diagnosis Date  . Hypertension   . Incidental pulmonary nodule 01/08/2020   Multiple in right lung - need f/u CT 1 year due to h/o smoking  . Mild aortic stenosis   . Mitral valve stenosis, severe   . Moderate mitral regurgitation   . Pneumonia    x 1  . Rheumatic mitral stenosis with regurgitation    Echo 08/08/16:  Severe mitral stenosis (mean gradient 17 mmHg).  Moderate mitral regurgitation.  . 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 Wears glasses   . Wears partial dentures    upper     Assessment: 55 yo male with PMH of rheumatic heart disease s/p mechanical mitral valve and PAF on warfarin PTA who presents with fatigue, weakness, anemia. INR supratherapeutic at 5.9 on admission. Patient was given Vitamin K 2.5mg  PO x 1 on 3/27 at 2143. Also received 1 unit PRBCs for Hgb 7. Pharmacy consulted to dose warfarin while patient in the hospital. Home dose is 5mg  on Tues, Thurs, Sat, and 7.5mg  on all other days of the week - last dose 3/27.   Today, 01/06/21:  INR remains subtherapeutic at 2.2 today after resumption of warfarin yesterday   CBC: Hgb low/stable at 7.9, Pltc WNL No bleeding issues per nursing  SCr 1.03  with CrCl ~ 80 ml/min  Enoxaparin added 3/29 per MD for bridging until INR therapeutic   Goal of Therapy:  INR 2.5-3.5    Plan:  Continue Enoxaparin 1mg /kg SQ q12h, as INR remains subtherapeutic Warfarin 7.5mg  PO x 1 Daily PT/INR Monitor CBC and for s/sx of bleeding    4/29, PharmD, BCPS Clinical Pharmacist 01/06/2021, 9:30 AM

## 2021-01-06 NOTE — Progress Notes (Signed)
PROGRESS NOTE  Melvin Burns TOI:[id number] DOB: Feb 13, 1966   PCP: Chilton Si, MD  Patient is from: Home.  Lives alone.  Ambulates independently at baseline.  DOA: 01/03/2021 LOS: 2  Chief complaints: Fatigue and generalized weakness  Brief Narrative / Interim history: 55 year old M with PMH of CHF, PAF, rheumatic MV s/p MVR, COVID-19 infection, HTN and chronic periodontitis presenting with fatigue and weakness for 3 days and admitted for symptomatic anemia and acute on chronic diastolic CHF.  Fecal Hemoccult was negative.  He received a unit of blood with appropriate response.  H&H has been stable since then.  Also started on IV Lasix for CHF exacerbation.  Patient had intermittent palpitation with SVT.  INR subtherapeutic.   Subjective: Seen and examined earlier this morning.  He had an episode of palpitation when the heart rate went up to 170s.  Denies dizziness, chest pain or shortness of breath.  Denies GI or UTI symptoms.  Objective: Vitals:   01/05/21 2238 01/06/21 0425 01/06/21 0849 01/06/21 1342  BP: 98/71 122/72 112/78 112/71  Pulse: 78 83 81 76  Resp:  (!) 21  18  Temp: 98.5 F (36.9 C) 99 F (37.2 C)  97.8 F (36.6 C)  TempSrc: Oral Oral  Oral  SpO2: 98% 93%  98%  Weight:      Height:        Intake/Output Summary (Last 24 hours) at 01/06/2021 1354 Last data filed at 01/06/2021 1046 Gross per 24 hour  Intake 126 ml  Output 800 ml  Net -674 ml   Filed Weights   01/04/21 0206  Weight: 75.8 kg    Examination:  GENERAL: No apparent distress.  Nontoxic. HEENT: MMM.  Vision and hearing grossly intact.  NECK: Supple.  No apparent JVD.  RESP:  No IWOB.  Fair aeration bilaterally. CVS:  RRR. Heart sounds normal.  ABD/GI/GU: BS+. Abd soft, NTND.  MSK/EXT:  Moves extremities. No apparent deformity. No edema.  SKIN: no apparent skin lesion or wound NEURO: Awake, alert and oriented appropriately.  No apparent focal neuro deficit. PSYCH: Calm. Normal  affect.  Procedures:  None  Microbiology summarized: COVID-19 PCR nonreactive.  Assessment & Plan: Symptomatic anemia: Hemoccult negative.  Hemolysis?  He has mechanical valve and slightly elevated total bilirubin.  Anemia panel suggested some degree of iron deficiency. Recent Labs    12/17/20 0915 12/18/20 0525 12/19/20 0231 12/20/20 0604 12/21/20 0505 12/30/20 1617 01/03/21 2010 01/04/21 0548 01/05/21 0338 01/06/21 0342  HGB 9.0* 9.8* 8.8* 9.5* 9.4* 7.4* 7.0* 8.0* 7.8* 7.9*  -Transfused 1 unit with appropriate response.  H&H stable. -He has slightly elevated total bilirubin and mechanical valve -Check LDH and haptoglobin -Fractionate bilirubin  Acute on chronic diastolic CHF: TTE in 11/29/2020 with LVEF of 60 to 65%, indeterminate DD.  BNP elevated at 649 (higher than baseline).  Diuresed with IV Lasix.  I&O incomplete.  Appears euvolemic on my exam. -Back to p.o. Lasix starting tomorrow -Monitor fluid status and renal functions  AKI: Resolved. Recent Labs    12/18/20 0525 12/19/20 0231 12/20/20 0604 12/21/20 0505 12/25/20 1232 12/30/20 1617 01/03/21 2010 01/04/21 0548 01/05/21 0338 01/06/21 0342  BUN 15 14 16 16 11 15 17 17 16 18   CREATININE 1.06 0.84 0.87 1.00 0.92 1.03 1.40* 1.08 1.07 1.03  -Continue monitoring  Hypokalemia: K3.7.  Mg 2.1. -K-Dur 40 mEq x 1  Paroxysmal A. Fib: Rate controlled. Supraventricular tachycardia: had episode of SVT with HR to 170s last night -Continue home bisoprolol -Continue  warfarin with Lovenox bridge.  INR 2.2. -Change DuoNeb to Xopenex and ipratropium -Continue telemetry monitoring  RHD s/p mechanical mitral valve -Continue warfarin with Lovenox bridge.  INR subtherapeutic at 2.2. Goal INR of 2.5 to 3.5  Leukocytosis: Has mild temps but no clear source of infection. -Recheck CBC with differential in the morning  Essential hypertension: Normotensive. -Continue nebivolol and Lasix as above -Holding amlodipine  and lisinopril  Debility/generalized weakness: Lives alone.  Ambulates independently at baseline. -PT/OT eval    Body mass index is 24.68 kg/m.         DVT prophylaxis:  On warfarin  Code Status: Full code Family Communication: Patient and/or RN. Available if any question.  Level of care: Telemetry Status is: Inpatient  Remains inpatient appropriate because:Hemodynamically unstable, Unsafe d/c plan, IV treatments appropriate due to intensity of illness or inability to take PO and Inpatient level of care appropriate due to severity of illness   Dispo: The patient is from: Home              Anticipated d/c is to: Home              Patient currently is not medically stable to d/c.   Difficult to place patient No       Consultants:  None    Sch Meds:  Scheduled Meds: . docusate sodium  100 mg Oral Daily  . enoxaparin (LOVENOX) injection  1 mg/kg Subcutaneous Q12H  . [START ON 01/07/2021] furosemide  40 mg Oral Daily  . nebivolol  5 mg Oral Daily  . potassium chloride  40 mEq Oral Once  . sodium chloride flush  3 mL Intravenous Q12H  . Warfarin - Pharmacist Dosing Inpatient   Does not apply q1600   Continuous Infusions: PRN Meds:.acetaminophen **OR** acetaminophen, ipratropium-albuterol, metoprolol tartrate, polyethylene glycol  Antimicrobials: Anti-infectives (From admission, onward)   None       I have personally reviewed the following labs and images: CBC: Recent Labs  Lab 12/30/20 1617 01/03/21 2010 01/04/21 0548 01/05/21 0338 01/06/21 0342  WBC 13.4* 14.1* 14.5* 15.9* 16.1*  NEUTROABS 8.9* 11.2*  --   --   --   HGB 7.4* 7.0* 8.0* 7.8* 7.9*  HCT 24.5* 24.9* 26.8* 26.0* 26.3*  MCV 86.3 86.2 85.4 84.7 82.7  PLT 335 498* 428* 413* 399   BMP &GFR Recent Labs  Lab 12/30/20 1617 01/03/21 2010 01/04/21 0548 01/05/21 0338 01/06/21 0342  NA 140 137 136 136 138  K 3.4* 3.3* 3.6 3.2* 3.7  CL 104 102 105 105 106  CO2 23 20* 21* 22 22  GLUCOSE  136* 159* 146* 109* 103*  BUN 15 17 17 16 18   CREATININE 1.03 1.40* 1.08 1.07 1.03  CALCIUM 9.4 9.0 8.9 8.8* 9.0  MG  --  2.1  --   --  2.1   Estimated Creatinine Clearance: 80.1 mL/min (by C-G formula based on SCr of 1.03 mg/dL). Liver & Pancreas: Recent Labs  Lab 12/30/20 1617 01/03/21 2010 01/04/21 0548 01/05/21 0338 01/06/21 0342  AST 40 43* 47* 38 37  ALT 16 21 27 30  34  ALKPHOS 67 65 64 62 69  BILITOT 1.2 1.8* 1.4* 1.9* 1.5*  PROT 7.8 7.3 6.9 6.8 6.8  ALBUMIN 3.7 3.6 3.3* 3.3* 3.2*   No results for input(s): LIPASE, AMYLASE in the last 168 hours. No results for input(s): AMMONIA in the last 168 hours. Diabetic: No results for input(s): HGBA1C in the last 72 hours. Recent Labs  Lab 01/03/21 2018  GLUCAP 164*   Cardiac Enzymes: No results for input(s): CKTOTAL, CKMB, CKMBINDEX, TROPONINI in the last 168 hours. No results for input(s): PROBNP in the last 8760 hours. Coagulation Profile: Recent Labs  Lab 01/03/21 2010 01/04/21 0548 01/05/21 0338 01/06/21 0342  INR 5.9* 4.3* 2.2* 2.2*   Thyroid Function Tests: No results for input(s): TSH, T4TOTAL, FREET4, T3FREE, THYROIDAB in the last 72 hours. Lipid Profile: No results for input(s): CHOL, HDL, LDLCALC, TRIG, CHOLHDL, LDLDIRECT in the last 72 hours. Anemia Panel: Recent Labs    01/03/21 2200  RETICCTPCT 9.9*   Urine analysis:    Component Value Date/Time   COLORURINE YELLOW 01/04/2021 1251   APPEARANCEUR CLEAR 01/04/2021 1251   LABSPEC >1.046 (H) 01/04/2021 1251   PHURINE 6.0 01/04/2021 1251   GLUCOSEU NEGATIVE 01/04/2021 1251   HGBUR SMALL (A) 01/04/2021 1251   BILIRUBINUR NEGATIVE 01/04/2021 1251   KETONESUR NEGATIVE 01/04/2021 1251   PROTEINUR 30 (A) 01/04/2021 1251   UROBILINOGEN 0.2 03/11/2013 1135   NITRITE NEGATIVE 01/04/2021 1251   LEUKOCYTESUR NEGATIVE 01/04/2021 1251   Sepsis Labs: Invalid input(s): PROCALCITONIN, LACTICIDVEN  Microbiology: Recent Results (from the past 240 hour(s))   SARS CORONAVIRUS 2 (TAT 6-24 HRS) Nasopharyngeal Nasopharyngeal Swab     Status: None   Collection Time: 01/04/21 12:34 AM   Specimen: Nasopharyngeal Swab  Result Value Ref Range Status   SARS Coronavirus 2 NEGATIVE NEGATIVE Final    Comment: (NOTE) SARS-CoV-2 target nucleic acids are NOT DETECTED.  The SARS-CoV-2 RNA is generally detectable in upper and lower respiratory specimens during the acute phase of infection. Negative results do not preclude SARS-CoV-2 infection, do not rule out co-infections with other pathogens, and should not be used as the sole basis for treatment or other patient management decisions. Negative results must be combined with clinical observations, patient history, and epidemiological information. The expected result is Negative.  Fact Sheet for Patients: HairSlick.no  Fact Sheet for Healthcare Providers: quierodirigir.com  This test is not yet approved or cleared by the [place] FDA and  has been authorized for detection and/or diagnosis of SARS-CoV-2 by FDA under an Emergency Use Authorization (EUA). This EUA will remain  in effect (meaning this test can be used) for the duration of the COVID-19 declaration under Se ction 564(b)(1) of the Act, 21 U.S.C. section 360bbb-3(b)(1), unless the authorization is terminated or revoked sooner.  Performed at [facility], [address], [place], [place] [address]     Radiology Studies: No results found.    Sharetta Ricchio T. Onika Gudiel [facility]  If 7PM-7AM, please contact night-coverage [email or web address] 01/06/2021, 1:54 PM

## 2021-01-06 NOTE — Evaluation (Signed)
Occupational Therapy Evaluation Patient Details Name: Melvin Burns. Tarkowski MRN: [id number] DOB: 05/14/1966 Today's Date: 01/06/2021    History of Present Illness 55 y.o. 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 fib, COVID-19 infection who presents with 3 days of fatigue and weakness.  As above patient has had fatigue and weakness for the past few days especially on exertion such as walking. Admitted for symptomatic anemia.   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 On evaluation patient independent with bed mobility and ambulation - ambulates in room without device and reports ambulating with nursing in the hall as well. Patient has one loss of balance that he was able to correct and reporting "bad balance" at baseline. Patient demonstrated normal strength, able to perform sit to stand x 5 without upper extremity use and all physical abilities to perform ADLs. Reports independence with toileting without nursing assistance. Patient had no complaints of chest pain, palpitations, shortness of breath or fatigue. Hr 89-90 after activity. No OT needs at this time.    Follow Up Recommendations  No OT follow up    Equipment Recommendations  None recommended by OT    Recommendations for Other Services       Precautions / Restrictions Precautions Precautions: None Restrictions Weight Bearing Restrictions: No      Mobility Bed Mobility Overal bed mobility: Independent                  Transfers Overall transfer level: Independent                    Balance                                           ADL either performed or assessed with clinical judgement   ADL Overall ADL's : Independent                                              Vision Patient Visual Report: No change from baseline       Perception     Praxis      Pertinent Vitals/Pain Pain Assessment: No/denies pain     Hand Dominance     Extremity/Trunk Assessment Upper Extremity Assessment Upper Extremity Assessment: Overall WFL for tasks assessed   Lower Extremity Assessment Lower Extremity Assessment: Overall WFL for tasks assessed   Cervical / Trunk Assessment Cervical / Trunk Assessment: Normal   Communication Communication Communication: No difficulties   Cognition Arousal/Alertness: Awake/alert Behavior During Therapy: WFL for tasks assessed/performed Overall Cognitive Status: Within Functional Limits for tasks assessed                                     General Comments       Exercises     Shoulder Instructions      Home Living Family/patient expects to be discharged to:: Private residence Living Arrangements: Alone   Type of Home: Apartment Home Access: Stairs to enter Entrance Stairs-Number of Steps: 14   Home  Layout: One level     Bathroom Shower/Tub: Chief Strategy Officer: Standard     Home Equipment: None          Prior Functioning/Environment Level of Independence: Independent                 OT Problem List:        OT Treatment/Interventions:      OT Goals(Current goals can be found in the care plan section) Acute Rehab OT Goals OT Goal Formulation: All assessment and education complete, DC therapy  OT Frequency:     Barriers to D/C:            Co-evaluation              AM-PAC OT "6 Clicks" Daily Activity     Outcome Measure Help from another person eating meals?: None Help from another person taking care of personal grooming?: None Help from another person toileting, which includes using toliet, bedpan, or urinal?: None Help from another person bathing (including washing, rinsing, drying)?: None Help from another person  to put on and taking off regular upper body clothing?: None Help from another person to put on and taking off regular lower body clothing?: None 6 Click Score: 24   End of Session Nurse Communication: Mobility status  Activity Tolerance: Patient tolerated treatment well Patient left: in bed;with call bell/phone within reach  OT Visit Diagnosis: Muscle weakness (generalized) (M62.81)                Time: 8032-1224 OT Time Calculation (min): 5 min Charges:  OT General Charges $OT Visit: 1 Visit OT Evaluation $OT Eval Low Complexity: 1 Low  Aidee Latimore, OTR/L Acute Care Rehab Services  Office [phone] Pager: [phone]   Kelli Churn 01/06/2021, 2:50 PM

## 2021-01-06 NOTE — Progress Notes (Signed)
EKG at sinus rhythm with pac. Irregular heart  70-160's.pt denies chest pain, On call MD B. Chotner made aware. Will cont to to monitor pt hearth rythm for any sustain tachycardic.

## 2021-01-07 ENCOUNTER — Other Ambulatory Visit: Payer: Self-pay | Admitting: Cardiology

## 2021-01-07 ENCOUNTER — Telehealth: Payer: Self-pay | Admitting: Cardiovascular Disease

## 2021-01-07 DIAGNOSIS — I471 Supraventricular tachycardia: Secondary | ICD-10-CM

## 2021-01-07 LAB — CBC
HCT: 28.6 % — ABNORMAL LOW (ref 39.0–52.0)
Hemoglobin: 8.5 g/dL — ABNORMAL LOW (ref 13.0–17.0)
MCH: 24.7 pg — ABNORMAL LOW (ref 26.0–34.0)
MCHC: 29.7 g/dL — ABNORMAL LOW (ref 30.0–36.0)
MCV: 83.1 fL (ref 80.0–100.0)
Platelets: 416 10*3/uL — ABNORMAL HIGH (ref 150–400)
RBC: 3.44 MIL/uL — ABNORMAL LOW (ref 4.22–5.81)
RDW: 21.3 % — ABNORMAL HIGH (ref 11.5–15.5)
WBC: 15.5 10*3/uL — ABNORMAL HIGH (ref 4.0–10.5)
nRBC: 0.3 % — ABNORMAL HIGH (ref 0.0–0.2)

## 2021-01-07 LAB — IRON AND TIBC
Iron: 31 ug/dL — ABNORMAL LOW (ref 45–182)
Saturation Ratios: 8 % — ABNORMAL LOW (ref 17.9–39.5)
TIBC: 383 ug/dL (ref 250–450)
UIBC: 352 ug/dL

## 2021-01-07 LAB — RETICULOCYTES
Immature Retic Fract: 22.9 % — ABNORMAL HIGH (ref 2.3–15.9)
RBC.: 3.46 MIL/uL — ABNORMAL LOW (ref 4.22–5.81)
Retic Count, Absolute: 204.5 10*3/uL — ABNORMAL HIGH (ref 19.0–186.0)
Retic Ct Pct: 5.7 % — ABNORMAL HIGH (ref 0.4–3.1)

## 2021-01-07 LAB — HAPTOGLOBIN: Haptoglobin: <10 mg/dL — ABNORMAL LOW (ref 29–370)

## 2021-01-07 LAB — RENAL FUNCTION PANEL
Albumin: 3.5 g/dL (ref 3.5–5.0)
Anion gap: 11 (ref 5–15)
BUN: 17 mg/dL (ref 6–20)
CO2: 21 mmol/L — ABNORMAL LOW (ref 22–32)
Calcium: 9.3 mg/dL (ref 8.9–10.3)
Chloride: 106 mmol/L (ref 98–111)
Creatinine, Ser: 0.98 mg/dL (ref 0.61–1.24)
GFR, Estimated: >60 mL/min (ref >60)
Glucose, Bld: 103 mg/dL — ABNORMAL HIGH (ref 70–99)
Phosphorus: 3.8 mg/dL (ref 2.5–4.6)
Potassium: 3.5 mmol/L (ref 3.5–5.1)
Sodium: 138 mmol/L (ref 135–145)

## 2021-01-07 LAB — VITAMIN B12: Vitamin B-12: 402 pg/mL (ref 180–914)

## 2021-01-07 LAB — FERRITIN: Ferritin: 52 ng/mL (ref 24–336)

## 2021-01-07 LAB — FOLATE: Folate: 9.3 ng/mL (ref >5.9)

## 2021-01-07 LAB — PROTIME-INR
INR: 2.3 — ABNORMAL HIGH (ref 0.8–1.2)
Prothrombin Time: 24.7 seconds — ABNORMAL HIGH (ref 11.4–15.2)

## 2021-01-07 LAB — MAGNESIUM: Magnesium: 2.2 mg/dL (ref 1.7–2.4)

## 2021-01-07 LAB — TSH: TSH: 0.89 u[IU]/mL (ref 0.350–4.500)

## 2021-01-07 MED ORDER — SODIUM CHLORIDE 0.9 % IV SOLN
510.0000 mg | Freq: Once | INTRAVENOUS | Status: AC
  Administered 2021-01-07: 510 mg via INTRAVENOUS
  Filled 2021-01-07: qty 510

## 2021-01-07 MED ORDER — POTASSIUM CHLORIDE CRYS ER 20 MEQ PO TBCR
40.0000 meq | EXTENDED_RELEASE_TABLET | ORAL | Status: AC
  Administered 2021-01-07 (×2): 40 meq via ORAL
  Filled 2021-01-07 (×2): qty 2

## 2021-01-07 MED ORDER — WARFARIN SODIUM 5 MG PO TABS
10.0000 mg | ORAL_TABLET | Freq: Once | ORAL | Status: AC
  Administered 2021-01-07: 10 mg via ORAL
  Filled 2021-01-07: qty 2

## 2021-01-07 MED ORDER — METOPROLOL TARTRATE 25 MG PO TABS
25.0000 mg | ORAL_TABLET | Freq: Two times a day (BID) | ORAL | Status: DC
  Administered 2021-01-07: 25 mg via ORAL
  Filled 2021-01-07 (×2): qty 1

## 2021-01-07 NOTE — Progress Notes (Addendum)
PROGRESS NOTE  Melvin Burns IZT:[id number] DOB: [birth year]   PCP: Chilton Si, MD  Patient is from: Home.  Lives alone.  Ambulates independently at baseline.  DOA: 01/03/2021 LOS: 3  Chief complaints: Fatigue and generalized weakness  Brief Narrative / Interim history: 55 year old M with PMH of CHF, PAF, rheumatic MV s/p MVR, COVID-19 infection, HTN and chronic periodontitis presenting with fatigue and weakness for 3 days and admitted for symptomatic anemia and acute on chronic diastolic CHF.  Fecal Hemoccult was negative.  He received a unit of blood with appropriate response.  H&H has been stable since then.  Also started on IV Lasix for CHF exacerbation.  Patient had intermittent palpitation with NSVT.  INR subtherapeutic at 2.3.   Subjective: Seen and examined earlier this morning.  No major events overnight of this morning.  He went into SVT with heart rate upto 170s while watching soccer later this morning.  He was not symptomatic from this.  Denies chest pain, dyspnea, GI or UTI symptoms.  Objective: Vitals:   01/07/21 0035 01/07/21 0520 01/07/21 1059 01/07/21 1344  BP: 104/74 112/76 115/72 113/75  Pulse: 75 80 85 76  Resp: 16 18 18 18   Temp:  98.7 F (37.1 C)  97.9 F (36.6 C)  TempSrc:  Oral  Oral  SpO2: 95% 93% 98% 100%  Weight:      Height:       No intake or output data in the 24 hours ending 01/07/21 1349 Filed Weights   01/04/21 0206  Weight: 75.8 kg    Examination:  GENERAL: No apparent distress.  Nontoxic. HEENT: MMM.  Vision and hearing grossly intact.  NECK: Supple.  No apparent JVD.  RESP: On RA.  No IWOB.  Fair aeration bilaterally. CVS:  RRR. Heart sounds normal.  ABD/GI/GU: BS+. Abd soft, NTND.  MSK/EXT:  Moves extremities. No apparent deformity. No edema.  SKIN: no apparent skin lesion or wound NEURO: Awake, alert and oriented appropriately.  No apparent focal neuro deficit. PSYCH: Calm. Normal affect.  Procedures:   None  Microbiology summarized: COVID-19 PCR nonreactive.  Assessment & Plan: Symptomatic anemia: Hemoccult negative.  Likely hemolysis from mechanical valve.  Anemia panel suggested some degree of iron deficiency.  Has elevated LDH and indirect bili. Recent Labs    12/18/20 0525 12/19/20 0231 12/20/20 0604 12/21/20 0505 12/30/20 1617 01/03/21 2010 01/04/21 0548 01/05/21 0338 01/06/21 0342 01/07/21 0252  HGB 9.8* 8.8* 9.5* 9.4* 7.4* 7.0* 8.0* 7.8* 7.9* 8.5*  -Transfused 1 unit with appropriate response.  H&H stable. -IV Feraheme x1 -Follow haptoglobin -Continue monitoring  Acute on chronic diastolic CHF: TTE in 11/29/2020 with LVEF of 60 to 65%, indeterminate DD.  BNP elevated at 649 (higher than baseline).  Diuresed with IV Lasix.  I&O incomplete.  Appears euvolemic on my exam. -Resume home p.o. Lasix -Monitor fluid status and renal functions  AKI: Resolved. Recent Labs    12/19/20 0231 12/20/20 0604 12/21/20 0505 12/25/20 1232 12/30/20 1617 01/03/21 2010 01/04/21 0548 01/05/21 0338 01/06/21 0342 01/07/21 0252  BUN 14 16 16 11 15 17 17 16 18 17   CREATININE 0.84 0.87 1.00 0.92 1.03 1.40* 1.08 1.07 1.03 0.98  -Continue monitoring  Hypokalemia: K3.5.  Mg 2.2. -K-Dur 40 mEq x 2  Paroxysmal A. Fib: Rate controlled. NSVT: had episode of SVT with HR to 170s later this morning.  Not symptomatic.  Probably lasted about 8 to 10 seconds.  He is already on Bystolic 5 mg daily. -We will discuss with  cardiology -Continue home Bystolic 5 mg daily pending input by cardiology -Continue warfarin with Lovenox bridge.  INR 2.3 -Changed DuoNeb to Xopenex and ipratropium on 3/30. -Optimize K and Mg -Continue telemetry monitoring  Addendum -Card, Dr. Mayford Knife who recommended changing Bystolic to metoprolol 25 mg.  Will start tonight -Cardiology to arrange outpatient monitor.   RHD s/p mechanical mitral valve -Continue warfarin with Lovenox bridge.  INR subtherapeutic at 2.3.  Goal INR of 2.5 to 3.5  Leukocytosis: Has mild temps but no clear source of infection. -Recheck CBC with differential in the morning  Essential hypertension: Normotensive. -Continue Bystolic and Lasix as above -Holding amlodipine and lisinopril  Debility/generalized weakness: Lives alone.  Ambulates independently at baseline. -No need identified by therapy.    Body mass index is 24.68 kg/m.         DVT prophylaxis:  On warfarin warfarin (COUMADIN) tablet 10 mg  Code Status: Full code Family Communication: Patient and/or RN. Available if any question.  Level of care: Telemetry Status is: Inpatient  Remains inpatient appropriate because:Hemodynamically unstable, Unsafe d/c plan, IV treatments appropriate due to intensity of illness or inability to take PO and Inpatient level of care appropriate due to severity of illness   Dispo: The patient is from: Home              Anticipated d/c is to: Home              Patient currently is not medically stable to d/c.   Difficult to place patient No       Consultants:  Cardiology   Sch Meds:  Scheduled Meds: . docusate sodium  100 mg Oral Daily  . enoxaparin (LOVENOX) injection  1 mg/kg Subcutaneous Q12H  . furosemide  40 mg Oral Daily  . nebivolol  5 mg Oral Daily  . sodium chloride flush  3 mL Intravenous Q12H  . warfarin  10 mg Oral ONCE-1600  . Warfarin - Pharmacist Dosing Inpatient   Does not apply q1600   Continuous Infusions: PRN Meds:.acetaminophen **OR** acetaminophen, ipratropium, levalbuterol, metoprolol tartrate, polyethylene glycol  Antimicrobials: Anti-infectives (From admission, onward)   None       I have personally reviewed the following labs and images: CBC: Recent Labs  Lab 01/03/21 2010 01/04/21 0548 01/05/21 0338 01/06/21 0342 01/07/21 0252  WBC 14.1* 14.5* 15.9* 16.1* 15.5*  NEUTROABS 11.2*  --   --   --   --   HGB 7.0* 8.0* 7.8* 7.9* 8.5*  HCT 24.9* 26.8* 26.0* 26.3* 28.6*  MCV  86.2 85.4 84.7 82.7 83.1  PLT 498* 428* 413* 399 416*   BMP &GFR Recent Labs  Lab 01/03/21 2010 01/04/21 0548 01/05/21 0338 01/06/21 0342 01/07/21 0252  NA 137 136 136 138 138  K 3.3* 3.6 3.2* 3.7 3.5  CL 102 105 105 106 106  CO2 20* 21* 22 22 21*  GLUCOSE 159* 146* 109* 103* 103*  BUN 17 17 16 18 17   CREATININE 1.40* 1.08 1.07 1.03 0.98  CALCIUM 9.0 8.9 8.8* 9.0 9.3  MG 2.1  --   --  2.1 2.2  PHOS  --   --   --   --  3.8   Estimated Creatinine Clearance: 84.2 mL/min (by C-G formula based on SCr of 0.98 mg/dL). Liver & Pancreas: Recent Labs  Lab 01/03/21 2010 01/04/21 0548 01/05/21 0338 01/06/21 0342 01/06/21 1522 01/07/21 0252  AST 43* 47* 38 37  --   --   ALT 21 27 30  34  --   --   ALKPHOS 65 64 62 69  --   --   BILITOT 1.8* 1.4* 1.9* 1.5* 1.2  --   PROT 7.3 6.9 6.8 6.8  --   --   ALBUMIN 3.6 3.3* 3.3* 3.2*  --  3.5   No results for input(s): LIPASE, AMYLASE in the last 168 hours. No results for input(s): AMMONIA in the last 168 hours. Diabetic: No results for input(s): HGBA1C in the last 72 hours. Recent Labs  Lab 01/03/21 2018  GLUCAP 164*   Cardiac Enzymes: No results for input(s): CKTOTAL, CKMB, CKMBINDEX, TROPONINI in the last 168 hours. No results for input(s): PROBNP in the last 8760 hours. Coagulation Profile: Recent Labs  Lab 01/03/21 2010 01/04/21 0548 01/05/21 0338 01/06/21 0342 01/07/21 0252  INR 5.9* 4.3* 2.2* 2.2* 2.3*   Thyroid Function Tests: Recent Labs    01/07/21 0252  TSH 0.890   Lipid Profile: No results for input(s): CHOL, HDL, LDLCALC, TRIG, CHOLHDL, LDLDIRECT in the last 72 hours. Anemia Panel: Recent Labs    01/07/21 0252  VITAMINB12 402  FOLATE 9.3  FERRITIN 52  TIBC 383  IRON 31*  RETICCTPCT 5.7*   Urine analysis:    Component Value Date/Time   COLORURINE YELLOW 01/04/2021 1251   APPEARANCEUR CLEAR 01/04/2021 1251   LABSPEC >1.046 (H) 01/04/2021 1251   PHURINE 6.0 01/04/2021 1251   GLUCOSEU NEGATIVE  01/04/2021 1251   HGBUR SMALL (A) 01/04/2021 1251   BILIRUBINUR NEGATIVE 01/04/2021 1251   KETONESUR NEGATIVE 01/04/2021 1251   PROTEINUR 30 (A) 01/04/2021 1251   UROBILINOGEN 0.2 03/11/2013 1135   NITRITE NEGATIVE 01/04/2021 1251   LEUKOCYTESUR NEGATIVE 01/04/2021 1251   Sepsis Labs: Invalid input(s): PROCALCITONIN, LACTICIDVEN  Microbiology: Recent Results (from the past 240 hour(s))  SARS CORONAVIRUS 2 (TAT 6-24 HRS) Nasopharyngeal Nasopharyngeal Swab     Status: None   Collection Time: 01/04/21 12:34 AM   Specimen: Nasopharyngeal Swab  Result Value Ref Range Status   SARS Coronavirus 2 NEGATIVE NEGATIVE Final    Comment: (NOTE) SARS-CoV-2 target nucleic acids are NOT DETECTED.  The SARS-CoV-2 RNA is generally detectable in upper and lower respiratory specimens during the acute phase of infection. Negative results do not preclude SARS-CoV-2 infection, do not rule out co-infections with other pathogens, and should not be used as the sole basis for treatment or other patient management decisions. Negative results must be combined with clinical observations, patient history, and epidemiological information. The expected result is Negative.  Fact Sheet for Patients: HairSlick.no  Fact Sheet for Healthcare Providers: quierodirigir.com  This test is not yet approved or cleared by the [place] FDA and  has been authorized for detection and/or diagnosis of SARS-CoV-2 by FDA under an Emergency Use Authorization (EUA). This EUA will remain  in effect (meaning this test can be used) for the duration of the COVID-19 declaration under Se ction 564(b)(1) of the Act, 21 U.S.C. section 360bbb-3(b)(1), unless the authorization is terminated or revoked sooner.  Performed at [facility], [address], [place], [place] [address]     Radiology Studies: No results found.    Adonica Fukushima T. Adrena Nakamura [facility]  If  7PM-7AM, please contact night-coverage [email or web address] 01/07/2021, 1:49 PM

## 2021-01-07 NOTE — Evaluation (Signed)
Physical Therapy Evaluation Only Patient Details Name: Melvin Burns. Kana MRN: [id number] DOB: [birth year] Today's Date: 01/07/2021   History of Present Illness  55 y.o. 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 fib, COVID-19 infection 12/04/20 who presents with 3 days of fatigue and weakness.  As above patient has had fatigue and weakness for the past few days especially on exertion such as walking. Admitted for symptomatic anemia.  Clinical Impression  Pt is from 2nd floor apartment, lives alone, independent at baseline and works full time as Location manager. Pt is mobilizing around the room and in hallway without unsteadiness, able to maneuver IV Pole around obstacles and complete turns. Denies pain, SOB, dizzines, HR 104 max with ambulation. Pt reports feeling "normal" and without concerns. No PT needs identified at this time, will sign off.    Follow Up Recommendations No PT follow up    Equipment Recommendations  None recommended by PT    Recommendations for Other Services       Precautions / Restrictions Precautions Precautions: None Restrictions Weight Bearing Restrictions: No      Mobility  Bed Mobility Overal bed mobility: Independent   Transfers Overall transfer level: Independent Equipment used: None   Ambulation/Gait Ambulation/Gait assistance: Independent Educational psychologist (Feet): 360 Feet Assistive device: IV Pole Gait Pattern/deviations: WFL(Within Functional Limits) Gait velocity: WFL   General Gait Details: pt ambulates around room, in hallway pushing IV pole, maneuvering past obstacles, denies dizziness, pain or SOB, able to continue conversation without SOB  Stairs            Wheelchair Mobility    Modified Rankin (Stroke Patients Only)       Balance Overall balance assessment: Independent        Pertinent  Vitals/Pain Pain Assessment: No/denies pain    Home Living Family/patient expects to be discharged to:: Private residence Living Arrangements: Alone   Type of Home: Apartment Home Access: Stairs to enter   [employer] of Steps: 14 Home Layout: One level Home Equipment: None      Prior Function Level of Independence: Independent         Comments: Pt independent with ADLs, community ambulation, no falls, works FT climbing stairs and Systems analyst.     Hand Dominance        Extremity/Trunk Assessment   Upper Extremity Assessment Upper Extremity Assessment: Defer to OT evaluation    Lower Extremity Assessment Lower Extremity Assessment: Overall WFL for tasks assessed    Cervical / Trunk Assessment Cervical / Trunk Assessment: Normal  Communication   Communication: No difficulties  Cognition Arousal/Alertness: Awake/alert Behavior During Therapy: WFL for tasks assessed/performed Overall Cognitive Status: Within Functional Limits for tasks assessed       General Comments      Exercises     Assessment/Plan    PT Assessment Patent does not need any further PT services  PT Problem List         PT Treatment Interventions      PT Goals (Current goals can be found in the Care Plan section)  Acute Rehab PT Goals Patient Stated Goal: home today PT Goal Formulation: With patient Time For Goal Achievement: 01/07/21 Potential to Achieve Goals: Good    Frequency     Barriers to discharge        Co-evaluation               AM-PAC PT "6  Clicks" Mobility  Outcome Measure Help needed turning from your back to your side while in a flat bed without using bedrails?: None Help needed moving from lying on your back to sitting on the side of a flat bed without using bedrails?: None Help needed moving to and from a bed to a chair (including a wheelchair)?: None Help needed standing up from a chair using your arms (e.g., wheelchair or bedside  chair)?: None Help needed to walk in hospital room?: None Help needed climbing 3-5 steps with a railing? : None 6 Click Score: 24    End of Session   Activity Tolerance: Patient tolerated treatment well Patient left: in chair;with call bell/phone within reach Nurse Communication: Mobility status PT Visit Diagnosis: Other abnormalities of gait and mobility (R26.89)    Time: 4193-7902 PT Time Calculation (min) (ACUTE ONLY): 15 min   Charges:   PT Evaluation $PT Eval Low Complexity: 1 Low           Tori Kinley Dozier PT, DPT 01/07/21, 10:22 AM

## 2021-01-07 NOTE — Telephone Encounter (Signed)
Patient had PSVT while in the hospital and is going to wear a 30 day monitor per Nada Boozer, PA.  Spoke with her and she is wanting him seen sooner than scheduled appointment which is 03/05/21.  I looked and did not see any open slots. Spoke with Lorelle Gibbs clinic supervisor to see if Dr. Duke Salvia had any hidden slots.  She is aware I will forward to Regis Bill, Dr. Leonides Sake nurse in the event she has a cancellation and can be moved up. Will keep appointment as scheduled for now. Nada Boozer, PA aware.

## 2021-01-07 NOTE — Telephone Encounter (Signed)
Melvin Burns called requesting the patient's appointment with Dr. Duke Salvia be moved up a week or 2. I did not see anything available. She states if this is not possible to just keep the appointment as is.

## 2021-01-07 NOTE — Discharge Instructions (Signed)
You had some fast heart rates in the hospital, we will send you a monitor to wear for 30 days to see how often you have these episodes.  Our office should contract you about the monitor -it may come in the mail.  If you have questions when you receive or if you do not receive please call Dr. Leonides Sake office.

## 2021-01-07 NOTE — Progress Notes (Signed)
ANTICOAGULATION CONSULT NOTE  Pharmacy Consult for warfarin (+ lovenox) Indication: s/p mechanical mitral valve replacement, PAF  No Known Allergies   Vital Signs: Temp: 98.7 F (37.1 C) (03/31 0520) Temp Source: Oral (03/31 0520) BP: 115/72 (03/31 1059) Pulse Rate: 85 (03/31 1059)  Labs: Recent Labs    01/05/21 0338 01/06/21 0342 01/07/21 0252  HGB 7.8* 7.9* 8.5*  HCT 26.0* 26.3* 28.6*  PLT 413* 399 416*  LABPROT 23.6* 23.7* 24.7*  INR 2.2* 2.2* 2.3*  CREATININE 1.07 1.03 0.98    Estimated Creatinine Clearance: 84.2 mL/min (by C-G formula based on SCr of 0.98 mg/dL).   Medical History: Past Medical History:  Diagnosis Date  . Hypertension   . Incidental pulmonary nodule 01/08/2020   Multiple in right lung - need f/u CT 1 year due to h/o smoking  . Mild aortic stenosis   . Mitral valve stenosis, severe   . Moderate mitral regurgitation   . Pneumonia    x 1  . Rheumatic mitral stenosis with regurgitation    Echo 08/08/16:  Severe mitral stenosis (mean gradient 17 mmHg).  Moderate mitral regurgitation.  . 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 Wears glasses   . Wears partial dentures    upper     Assessment: 55 yo male with PMH of rheumatic heart disease s/p mechanical mitral valve and PAF on warfarin PTA who presents with fatigue, weakness, anemia. INR supratherapeutic at 5.9 on admission. Patient was given Vitamin K 2.5mg  PO x 1 on 3/27 at 2143. Also received 1 unit PRBCs for Hgb 7. Pharmacy consulted to dose warfarin while patient in the hospital. Home dose is 5mg  on Tues, Thurs, Sat, and 7.5mg  on all other days of the week - last dose 3/27.   Today, 01/07/21:   INR remains subtherapeutic at 2.3 but rising slowly after resumption of warfarin on 3/29 and doses of 7.5mg  x 2    CBC: Hgb low/stable at 7.9, Pltc WNL  No  bleeding issues per nursing   Enoxaparin added 3/29 per MD for bridging until INR therapeutic   Goal of Therapy:  INR 2.5-3.5    Plan:   Continue Enoxaparin 1mg /kg SQ q12h until INR therapeutic  Warfarin 10mg  today  Daily PT/INR  Monitor CBC and for s/sx of bleeding    4/29, PharmD, BCPS 01/07/2021 11:01 AM

## 2021-01-08 DIAGNOSIS — I472 Ventricular tachycardia: Secondary | ICD-10-CM

## 2021-01-08 DIAGNOSIS — T82897A Other specified complication of cardiac prosthetic devices, implants and grafts, initial encounter: Secondary | ICD-10-CM | POA: Diagnosis present

## 2021-01-08 DIAGNOSIS — D594 Other nonautoimmune hemolytic anemias: Secondary | ICD-10-CM | POA: Diagnosis present

## 2021-01-08 DIAGNOSIS — D599 Acquired hemolytic anemia, unspecified: Secondary | ICD-10-CM

## 2021-01-08 DIAGNOSIS — I4729 Other ventricular tachycardia: Secondary | ICD-10-CM

## 2021-01-08 LAB — HEMOGLOBIN AND HEMATOCRIT, BLOOD
HCT: 28.6 % — ABNORMAL LOW (ref 39.0–52.0)
Hemoglobin: 8.3 g/dL — ABNORMAL LOW (ref 13.0–17.0)

## 2021-01-08 LAB — RENAL FUNCTION PANEL
Albumin: 3.2 g/dL — ABNORMAL LOW (ref 3.5–5.0)
Anion gap: 8 (ref 5–15)
BUN: 16 mg/dL (ref 6–20)
CO2: 21 mmol/L — ABNORMAL LOW (ref 22–32)
Calcium: 9.3 mg/dL (ref 8.9–10.3)
Chloride: 107 mmol/L (ref 98–111)
Creatinine, Ser: 1.02 mg/dL (ref 0.61–1.24)
GFR, Estimated: >60 mL/min (ref >60)
Glucose, Bld: 102 mg/dL — ABNORMAL HIGH (ref 70–99)
Phosphorus: 3.3 mg/dL (ref 2.5–4.6)
Potassium: 4.5 mmol/L (ref 3.5–5.1)
Sodium: 136 mmol/L (ref 135–145)

## 2021-01-08 LAB — PROTIME-INR
INR: 3 — ABNORMAL HIGH (ref 0.8–1.2)
Prothrombin Time: 30.3 seconds — ABNORMAL HIGH (ref 11.4–15.2)

## 2021-01-08 LAB — MAGNESIUM: Magnesium: 2 mg/dL (ref 1.7–2.4)

## 2021-01-08 MED ORDER — FERROUS SULFATE 325 (65 FE) MG PO TBEC: 325.0000 mg | DELAYED_RELEASE_TABLET | Freq: Two times a day (BID) | ORAL | 1 refills | Status: AC

## 2021-01-08 MED ORDER — SENNOSIDES-DOCUSATE SODIUM 8.6-50 MG PO TABS: 1.0000 | ORAL_TABLET | Freq: Two times a day (BID) | ORAL | 0 refills | Status: AC | PRN

## 2021-01-08 MED ORDER — METOPROLOL TARTRATE 25 MG PO TABS: 25.0000 mg | ORAL_TABLET | Freq: Two times a day (BID) | ORAL | 1 refills | Status: DC

## 2021-01-08 MED ORDER — METOPROLOL TARTRATE 50 MG PO TABS
50.0000 mg | ORAL_TABLET | Freq: Two times a day (BID) | ORAL | Status: DC
  Administered 2021-01-08: 50 mg via ORAL
  Filled 2021-01-08: qty 1

## 2021-01-08 MED ORDER — METOPROLOL TARTRATE 50 MG PO TABS: 50.0000 mg | ORAL_TABLET | Freq: Two times a day (BID) | ORAL | 1 refills | Status: DC

## 2021-01-08 NOTE — Discharge Summary (Signed)
Physician Discharge Summary  Melvin Melvin Burns DOB: 16-Dec-1965 DOA: 01/03/2021  PCP: Chilton Si, MD  Admit date: 01/03/2021 Discharge date: 01/08/2021  Admitted From: Home Disposition: Home  Recommendations for Outpatient Follow-up:  1. Follow ups as below. 2. Recommend INR in the next 3 to 4 days 3. Please obtain CBC/BMP/Mag at follow up 4. Please follow up on the following pending results: None  Home Health: None required Equipment/Devices: None required  Discharge Condition: Stable CODE STATUS: Full code   Follow-up Information    Chilton Si, MD Follow up on 03/05/2021.   Specialty: Cardiology Why: at 8:00 AM  we are trying to move up in May, her nurse will call you if they are able to do so.   Contact information: [address] [place] [address] [phone]               Hospital Course: 55 year old M with PMH of CHF, PAF, rheumatic MV s/p MVR, COVID-19 infection, HTN and chronic periodontitis presenting with fatigue and weakness for 3 days and admitted for symptomatic anemia and acute on chronic diastolic CHF.  Fecal Hemoccult was negative.  He has elevated LDH with low haptoglobin suggesting hemolytic anemia likely from his mechanical valve.   He also had supratherapeutic INR to 5.0 which was reversed with vitamin K.  He received a unit of blood with appropriate response.  H&H has been stable since then.  Anemia panel with iron deficiency.  He received IV Feraheme in house and discharged on oral ferrous sulfate.  INR was subtherapeutic after reversal with vitamin K.  He was bridged with subcu Lovenox, and INR was therapeutic at 3.0 on the day of discharge.  Patient also treated with IV Lasix for CHF exacerbation likely from his anemia.  Eventually transition to home oral Lasix.    Patient had intermittent palpitation with NSVT.  He was a symptomatic for most part.  After discussion with cardiology, Dr. Mayford Knife, changed  from Bystolic 5 mg to metoprolol 25 mg twice daily. However, he continued to have intermittent NSVT.  Increase metoprolol to 50 mg twice daily at discharge.  His amlodipine and lisinopril were stopped on admission and discontinued on discharge.  Cardiology to arrange a 30-day monitor.   See individual problem list below for more on hospital course.  Discharge Diagnoses:  Symptomatic anemia: Likely hemolytic from his mechanical valve.  Has elevated LDH and indirect bili, and low haptoglobin. Anemia panel suggested some degree of iron deficiency. Hemoccult negative. Recent Labs    12/19/20 0231 12/20/20 0604 12/21/20 0505 12/30/20 1617 01/03/21 2010 01/04/21 0548 01/05/21 0338 01/06/21 0342 01/07/21 0252 01/08/21 0317  HGB 8.8* 9.5* 9.4* 7.4* 7.0* 8.0* 7.8* 7.9* 8.5* 8.3*  -Transfused 1 unit with appropriate response.  H&H stable. -IV Feraheme on 3/30 -P.o. ferrous sulfate with bowel regimen -Recheck CBC in 1 to 2 weeks.  Acute on chronic diastolic CHF: TTE in 11/29/2020 with LVEF of 60 to 65%, indeterminate DD.  BNP elevated at 649 (higher than baseline).  Diuresed with IV Lasix very well and transitioned to home p.o. Lasix -Continue home Lasix.  AKI: Resolved. Recent Labs    12/20/20 0604 12/21/20 0505 12/25/20 1232 12/30/20 1617 01/03/21 2010 01/04/21 0548 01/05/21 0338 01/06/21 0342 01/07/21 0252 01/08/21 0317  BUN 16 16 11 15 17 17 16 18 17 16   CREATININE 0.87 1.00 0.92 1.03 1.40* 1.08 1.07 1.03 0.98 1.02  -Recheck renal function at follow-up.  Hypokalemia/hypomagnesemia: Resolved.  Paroxysmal A. Fib:  Rate controlled. PNSVT: Had episodes of SVTs with HR as high as 170s but asymptomatic for the most part.   -Changed Bystolic to metoprolol after discussion with cardiology.  -Cardiology to arrange a 30-day event monitor  RHD s/p mechanical mitral valve: INR therapeutic at 3.0. -Discharged on home warfarin. -Recommend INR check in 3 to 4 days  Leukocytosis:  Afebrile.  No signs of infection. -Recheck CBC at follow-up  Essential hypertension: Normotensive off home lisinopril and amlodipine.. -Changing Bystolic to metoprolol as above -Discontinued amlodipine and lisinopril on discharge -Continue home Lasix  Debility/generalized weakness: Likely due to anemia.  Resolved. -No need identified by therapy.     Body mass index is 24.68 kg/m.            Discharge Exam: Vitals:   01/08/21 0324 01/08/21 0923  BP: 111/80 113/74  Pulse: 82 88  Resp: 19 20  Temp: 98.8 F (37.1 C)   SpO2: 95% 97%    GENERAL: No apparent distress.  Nontoxic. HEENT: MMM.  Vision and hearing grossly intact.  NECK: Supple.  No apparent JVD.  RESP:  No IWOB.  Fair aeration bilaterally. CVS:  RRR.  Mechanical heart sound. ABD/GI/GU: Bowel sounds present. Soft. Non tender.  MSK/EXT:  Moves extremities. No apparent deformity. No edema.  SKIN: no apparent skin lesion or wound NEURO: Awake, alert and oriented appropriately.  No apparent focal neuro deficit. PSYCH: Calm. Normal affect.  Discharge Instructions  Discharge Instructions    Diet - low sodium heart healthy   Complete by: As directed    Discharge instructions   Complete by: As directed    It has been a pleasure taking care of you!  You were hospitalized due to anemia and congestive heart failure exacerbation.  You were treated with blood transfusion and iron infusion for anemia, and intravenous Lasix for heart failure exacerbation.  With that, your symptoms improved to the point we think it is safe to let you go home and follow-up with your doctors.  We made some adjustment to your home medications during this hospitalization.  Please review your new medication list and the directions on your medications before you take them.  Please have your INR checked in the next 3 to 4 days.  Also follow-up with your primary care doctor in 1 to 2 weeks.  Your cardiologist office will arrange outpatient  follow-up for an event monitor and further management of your heart condition.   Take care,   Increase activity slowly   Complete by: As directed      Allergies as of 01/08/2021   No Known Allergies     Medication List    STOP taking these medications   amLODipine 10 MG tablet Commonly known as: NORVASC   lisinopril 10 MG tablet Commonly known as: ZESTRIL   nebivolol 5 MG tablet Commonly known as: Bystolic     TAKE these medications   aspirin EC 81 MG tablet Take 1 tablet (81 mg total) by mouth daily.   ferrous sulfate 325 (65 FE) MG EC tablet Take 1 tablet (325 mg total) by mouth 2 (two) times daily.   furosemide 40 MG tablet Commonly known as: Lasix Take 1 tablet (40 mg total) by mouth daily for 30 doses.   metoprolol tartrate 50 MG tablet Commonly known as: LOPRESSOR Take 1 tablet (50 mg total) by mouth 2 (two) times daily.   senna-docusate 8.6-50 MG tablet Commonly known as: Senokot-S Take 1 tablet by mouth 2 (two) times daily between  meals as needed for mild constipation or moderate constipation.   warfarin 5 MG tablet Commonly known as: COUMADIN Take as directed. If you are unsure how to take this medication, talk to your nurse or doctor. Original instructions: Take 1 tablet (5 mg total) by mouth daily at 4 PM. What changed:   how much to take  when to take this  additional instructions       Consultations:  Cardiology over the phone  Procedures/Studies:   DG Chest 2 View  Result Date: 01/03/2021 CLINICAL DATA:  Hemoptysis EXAM: CHEST - 2 VIEW COMPARISON:  12/18/2020 FINDINGS: Unchanged mild diffuse interstitial opacity. Prosthetic valve. Normal cardiomediastinal contours. No pleural effusion or pneumothorax. IMPRESSION: Unchanged mild diffuse interstitial opacity. Electronically Signed   By: Deatra Robinson MelvinD.   On: 01/03/2021 21:14   DG Chest 2 View  Result Date: 12/17/2020 CLINICAL DATA:  55 year old male with COVID-19 diagnosed 13 days  ago. Shortness of breath. EXAM: CHEST - 2 VIEW COMPARISON:  Portable chest 12/07/2020 and earlier. FINDINGS: PA and lateral views. Stable lung volumes and mediastinal contours. Prior cardiac valve replacement. Visualized tracheal air column is within normal limits. No pneumothorax or layering pleural effusion. Trace fluid in the pulmonary fissures is decreased compared to last month. Perihilar and diffuse coarse pulmonary interstitial opacity has recurred and closely resembles that on 12/04/2020. These changes are new since 2021. No consolidation. No osseous abnormality identified.  Negative visible bowel gas. IMPRESSION: Recurrent perihilar and widespread coarse pulmonary interstitial opacity, closely resembling that on 12/04/2020, which was improved on 12/07/2020. Main differential considerations are Recurrent Pulmonary Edema, progressive sequelae of COVID-19. Electronically Signed   By: Odessa Fleming MelvinD.   On: 12/17/2020 07:18   CT Chest W Contrast  Result Date: 01/03/2021 CLINICAL DATA:  Hemoptysis EXAM: CT CHEST WITH CONTRAST TECHNIQUE: Multidetector CT imaging of the chest was performed during intravenous contrast administration. CONTRAST:  75mL OMNIPAQUE IOHEXOL 300 MG/ML  SOLN COMPARISON:  Chest x-ray from earlier in the same day. FINDINGS: Cardiovascular: Thoracic aorta and its branches are within normal limits without aneurysmal dilatation or dissection. 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 No cardiac enlargement is noted. The pulmonary artery as visualized is within normal limits. No evidence of pulmonary emboli are noted. Mediastinum/Nodes: No sizable hilar or mediastinal adenopathy is noted. Few scattered small mediastinal nodes are noted stable from the prior study. Thoracic inlet is within normal limits. The esophagus is unremarkable. Lungs/Pleura: Lungs are well aerated bilaterally. Some very mild interstitial density is noted similar to that seen on prior plain film examination consistent with mild  edema. No focal confluent infiltrate or sizable effusion is noted. No sizable parenchymal nodules are seen. Mild emphysematous changes are noted. Upper Abdomen: Visualized upper abdomen is within normal limits. Musculoskeletal: No acute bony abnormality is noted. IMPRESSION: Mild changes of interstitial edema. No other focal abnormality is seen. Aortic Atherosclerosis (ICD10-I70.0) and Emphysema (ICD10-J43.9). Electronically Signed   By: Alcide Clever MelvinD.   On: 01/03/2021 23:48   DG Chest Portable 1 View  Result Date: 12/18/2020 CLINICAL DATA:  Shortness of breath EXAM: PORTABLE CHEST 1 VIEW COMPARISON:  12/17/2020 FINDINGS: Increased interstitial markings with mild patchy opacities in the lungs bilaterally, favoring with mild atypical/viral infection or mild interstitial edema, grossly unchanged. No definite pleural effusions.  No pneumothorax. Heart is normal in size.  Prosthetic valve. IMPRESSION: Mild atypical/viral infection or interstitial edema, grossly unchanged. No definite pleural effusions. Electronically Signed   By: Charline Bills MelvinD.   On:  12/18/2020 06:14        The results of significant diagnostics from this hospitalization (including imaging, microbiology, ancillary and laboratory) are listed below for reference.     Microbiology: Recent Results (from the past 240 hour(s))  SARS CORONAVIRUS 2 (TAT 6-24 HRS) Nasopharyngeal Nasopharyngeal Swab     Status: None   Collection Time: 01/04/21 12:34 AM   Specimen: Nasopharyngeal Swab  Result Value Ref Range Status   SARS Coronavirus 2 NEGATIVE NEGATIVE Final    Comment: (NOTE) SARS-CoV-2 target nucleic acids are NOT DETECTED.  The SARS-CoV-2 RNA is generally detectable in upper and lower respiratory specimens during the acute phase of infection. Negative results do not preclude SARS-CoV-2 infection, do not rule out co-infections with other pathogens, and should not be used as the sole basis for treatment or other patient  management decisions. Negative results must be combined with clinical observations, patient history, and epidemiological information. The expected result is Negative.  Fact Sheet for Patients: HairSlick.no  Fact Sheet for Healthcare Providers: quierodirigir.com  This test is not yet approved or cleared by the [place] FDA and  has been authorized for detection and/or diagnosis of SARS-CoV-2 by FDA under an Emergency Use Authorization (EUA). This EUA will remain  in effect (meaning this test can be used) for the duration of the COVID-19 declaration under Se ction 564(b)(1) of the Act, 21 U.S.C. section 360bbb-3(b)(1), unless the authorization is terminated or revoked sooner.  Performed at [facility], [address] [place], [place] [address]      Labs:  CBC: Recent Labs  Lab 01/03/21 2010 01/04/21 7209 01/05/21 4709 01/06/21 0342 01/07/21 0252 01/08/21 0317  WBC 14.1* 14.5* 15.9* 16.1* 15.5*  --   NEUTROABS 11.2*  --   --   --   --   --   HGB 7.0* 8.0* 7.8* 7.9* 8.5* 8.3*  HCT 24.9* 26.8* 26.0* 26.3* 28.6* 28.6*  MCV 86.2 85.4 84.7 82.7 83.1  --   PLT 498* 428* 413* 399 416*  --    BMP &GFR Recent Labs  Lab 01/03/21 2010 01/04/21 0548 01/05/21 0338 01/06/21 0342 01/07/21 0252 01/08/21 0317  NA 137 136 136 138 138 136  K 3.3* 3.6 3.2* 3.7 3.5 4.5  CL 102 105 105 106 106 107  CO2 20* 21* 22 22 21* 21*  GLUCOSE 159* 146* 109* 103* 103* 102*  BUN 17 17 16 18 17 16   CREATININE 1.40* 1.08 1.07 1.03 0.98 1.02  CALCIUM 9.0 8.9 8.8* 9.0 9.3 9.3  MG 2.1  --   --  2.1 2.2 2.0  PHOS  --   --   --   --  3.8 3.3   Estimated Creatinine Clearance: 80.9 mL/min (by C-G formula based on SCr of 1.02 mg/dL). Liver & Pancreas: Recent Labs  Lab 01/03/21 2010 01/04/21 0548 01/05/21 0338 01/06/21 0342 01/06/21 1522 01/07/21 0252 01/08/21 0317  AST 43* 47* 38 37  --   --   --   ALT 21 27 30  34  --   --    --   ALKPHOS 65 64 62 69  --   --   --   BILITOT 1.8* 1.4* 1.9* 1.5* 1.2  --   --   PROT 7.3 6.9 6.8 6.8  --   --   --   ALBUMIN 3.6 3.3* 3.3* 3.2*  --  3.5 3.2*   No results for input(s): LIPASE, AMYLASE in the last 168 hours. No results for  input(s): AMMONIA in the last 168 hours. Diabetic: No results for input(s): HGBA1C in the last 72 hours. Recent Labs  Lab 01/03/21 2018  GLUCAP 164*   Cardiac Enzymes: No results for input(s): CKTOTAL, CKMB, CKMBINDEX, TROPONINI in the last 168 hours. No results for input(s): PROBNP in the last 8760 hours. Coagulation Profile: Recent Labs  Lab 01/04/21 0548 01/05/21 0338 01/06/21 0342 01/07/21 0252 01/08/21 0316  INR 4.3* 2.2* 2.2* 2.3* 3.0*   Thyroid Function Tests: Recent Labs    01/07/21 0252  TSH 0.890   Lipid Profile: No results for input(s): CHOL, HDL, LDLCALC, TRIG, CHOLHDL, LDLDIRECT in the last 72 hours. Anemia Panel: Recent Labs    01/07/21 0252  VITAMINB12 402  FOLATE 9.3  FERRITIN 52  TIBC 383  IRON 31*  RETICCTPCT 5.7*   Urine analysis:    Component Value Date/Time   COLORURINE YELLOW 01/04/2021 1251   APPEARANCEUR CLEAR 01/04/2021 1251   LABSPEC >1.046 (H) 01/04/2021 1251   PHURINE 6.0 01/04/2021 1251   GLUCOSEU NEGATIVE 01/04/2021 1251   HGBUR SMALL (A) 01/04/2021 1251   BILIRUBINUR NEGATIVE 01/04/2021 1251   KETONESUR NEGATIVE 01/04/2021 1251   PROTEINUR 30 (A) 01/04/2021 1251   UROBILINOGEN 0.2 03/11/2013 1135   NITRITE NEGATIVE 01/04/2021 1251   LEUKOCYTESUR NEGATIVE 01/04/2021 1251   Sepsis Labs: Invalid input(s): PROCALCITONIN, LACTICIDVEN   Time coordinating discharge: 40 minutes  SIGNED:  Almon Herculesaye T Duard Spiewak, MD  [facility] 01/08/2021, 2:06 PM  If 7PM-7AM, please contact night-coverage [email or web address]

## 2021-01-12 NOTE — Telephone Encounter (Signed)
Dr Duke Salvia had patient fall off tomorrow, called and scheduled appointment for tomorrow

## 2021-01-13 ENCOUNTER — Other Ambulatory Visit: Payer: Self-pay | Admitting: Cardiovascular Disease

## 2021-01-13 ENCOUNTER — Other Ambulatory Visit: Payer: Self-pay

## 2021-01-13 ENCOUNTER — Encounter: Payer: Self-pay | Admitting: Cardiovascular Disease

## 2021-01-13 ENCOUNTER — Encounter: Payer: Self-pay | Admitting: *Deleted

## 2021-01-13 ENCOUNTER — Ambulatory Visit (INDEPENDENT_AMBULATORY_CARE_PROVIDER_SITE_OTHER): Payer: 59

## 2021-01-13 ENCOUNTER — Ambulatory Visit (INDEPENDENT_AMBULATORY_CARE_PROVIDER_SITE_OTHER): Payer: 59 | Admitting: Cardiovascular Disease

## 2021-01-13 VITALS — BP 110/68 | HR 86 | Ht 69.0 in | Wt 172.4 lb

## 2021-01-13 DIAGNOSIS — Z954 Presence of other heart-valve replacement: Secondary | ICD-10-CM | POA: Diagnosis not present

## 2021-01-13 DIAGNOSIS — D649 Anemia, unspecified: Secondary | ICD-10-CM

## 2021-01-13 DIAGNOSIS — I340 Nonrheumatic mitral (valve) insufficiency: Secondary | ICD-10-CM

## 2021-01-13 DIAGNOSIS — Z5181 Encounter for therapeutic drug level monitoring: Secondary | ICD-10-CM

## 2021-01-13 DIAGNOSIS — I10 Essential (primary) hypertension: Secondary | ICD-10-CM | POA: Diagnosis not present

## 2021-01-13 DIAGNOSIS — I5031 Acute diastolic (congestive) heart failure: Secondary | ICD-10-CM

## 2021-01-13 DIAGNOSIS — I480 Paroxysmal atrial fibrillation: Secondary | ICD-10-CM | POA: Diagnosis not present

## 2021-01-13 DIAGNOSIS — I050 Rheumatic mitral stenosis: Secondary | ICD-10-CM

## 2021-01-13 LAB — POCT INR: INR: 3 (ref 2.0–3.0)

## 2021-01-13 MED ORDER — METOPROLOL TARTRATE 50 MG PO TABS: ORAL_TABLET | ORAL | 3 refills | Status: AC

## 2021-01-13 MED ORDER — FUROSEMIDE 40 MG PO TABS: 40.0000 mg | ORAL_TABLET | Freq: Two times a day (BID) | ORAL | 3 refills | Status: AC

## 2021-01-13 NOTE — Patient Instructions (Signed)
Continue taking 1.5 tablets every day except 1 tablet every Tuesday, Thursday and Saturday  Next INR in 6 weeks.  

## 2021-01-13 NOTE — Progress Notes (Signed)
Cardiology Office Note   Date:  01/13/2021   ID:  Melvin [place], DOB 12-14-1965, MRN [id number]  PCP:  Chilton Si, MD  Cardiologist:   Chilton Si, MD   No chief complaint on file.   History of Present Illness: [place] is a 55 y.o. 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  [place] was first seen 07/05/16 due to an abnormal EKG.  He saw his PCP on 8/23 and was noted to have PACs as well as a PVC. He was also noted to have LVH with repolarization abnormality.  He was referred to cardiology for further evaluation.  At that appointment he was noted to have a murmur on exam.  He had an echo 08/08/16 that showed LVEF 60-65%.  He had severe mitral stenosis with a mean gradient of 17 mmHg and moderate mitral regurgitation.  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  His blood presure was elevated and lisinopril was added to his regimen.  He did well clinically and exercise at the gym 5 to 7 days/week without any chest pain or shortness of breath.  He was treated for pneumonia 07/2019.  Echo at that time revealed a mean mitral valve gradient of 8 mmHg.  After that he stopped going to the gym as regularly.  His last appointment he reported some increased shortness of breath.  He had no edema, orthopnea, or PND.  It was unclear whether his symptoms were due to progressive valvular disease or residual dyspnea from pneumonia.  He was referred for an exercise stress echo to see if his gradient increased with exercise.  Unfortunately, the gradient was not measured during the stress test.  It was negative for ischemia.  He did develop chest pain and short runs of VT.  Therefore it was felt that he has symptomatic mitral stenosis and we discussed referring him for TEE and cath  in preparation for mitral valve replacement.  He presents today for preprocedural assessment.  [name] was referred for minimally invasive mechanical mitral valve replacement 02/2020.  He developed cellulitis and bacteremia post operatively.  He was treated with 2 weeks of cefepime.  He also had postoperative atrial fibrillation and was started on metoprolol.  He last followed up with Dr. Priscille Kluver on 04/2020 and was doing well.  His blood pressure was elevated at 179/105 at that appointment.  Prior to surgery he was on amlodipine 10 mg, chlorthalidone 25 mg, and lisinopril 10 mg.  He was discharged on lisinopril 10 mg and metoprolol 25mg  bid.   Dr. increased lisinopril to 20mg .  He bought a blood pressure cuff and has been checking his blood pressure at home.  It has been running as high as the 170s systolic.  Since his surgeries had some numbness at the surgical incision site.  He has occasional sharp pain at the incision site as well.  He is back to exercising and rides an exercise bike.  He feels good with exercise and has no exertional chest pain or shortness of breath.  He does sometimes get tired when he tries to jog.  He has no lower extremity edema, orthopnea, or PND.  He has noticed that he has been more sleepy since surgery.  He is planning to go home to [place] for a month and will be getting  married while there.  At his last appointment metoprolol was switched to nebivolol due to fatigue.  He was admitted 12/2020 with Covid-19 infection.  He was treated with IV diuretics, remdesivir, and steroids.  He returned a few days later with hypoxic respiratory failure and an oxygen saturation of 84% on room air.  He was started on supplemental oxygen.  BNP was minimally elevated at 128.  High-sensitivity troponin was flat at 14-15.  He was again diuresed and symptoms improved.  He followed up with Melvin FabianJesse cleaver, NP on 12/2020 and was gradually starting back exercising.  He was admitted late 12/2020 with anemia  and acute on chronic diastolic heart failure.  FOBT was negative and his INR was 5.  This was reversed with vitamin K and he was transfused 1 u pRBC.  He has NSVT and nebivolol was switched back to metoprolol and then increased.  Amlodipine and lisinopril were discharged and he a 30-day monitor was planned but not place.  Since discharge he continues to feel poorly.  He has exertional dyspnea.  He gets short of breath with minimal walking.  He also has mild hemoptysis.  He denies lower extremity edema, orthopnea, or PND.  He notes that his breathing was better when he was taking Lasix twice a day.  It is hard for him to catch his breath.  His appetite has been poor and he is tired.  He has a physical job working in [employer] and does not think that he is able to complete it at this time.  He notes that his heart has been racing.  It is more common with laying down or with movement.  He is able to hear his valve with each beat.   Past Medical History:  Diagnosis Date  . Hypertension   . Incidental pulmonary nodule 01/08/2020   Multiple in right lung - need f/u CT 1 year due to h/o smoking  . Mild aortic stenosis   . Mitral valve stenosis, severe   . Moderate mitral regurgitation   . Pneumonia    x 1  . Rheumatic mitral stenosis with regurgitation    Echo 08/08/16:  Severe mitral stenosis (mean gradient 17 mmHg).  Moderate mitral regurgitation.  . 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 Wears glasses   . Wears partial dentures    upper    Past Surgical History:  Procedure Laterality Date  . BILATERAL KNEE ARTHROSCOPY    . BUBBLE STUDY  12/16/2019   Procedure: BUBBLE STUDY;  Surgeon: Parke PoissonAcharya, Gayatri A, MD;  [facility];  Service: Cardiology;;  . MITRAL VALVE REPLACEMENT Right 03/06/2020   Procedure: MINIMALLY INVASIVE MITRAL VALVE (MV)  REPLACEMENT using Carbomedics Optiform 33 MM Mitral Bioprosthetic Valve.;  Surgeon: Purcell Nailswen, Clarence H, MD;  Location: [facility] OR;  Service: Open Heart Surgery;  Laterality: Right;  . MULTIPLE EXTRACTIONS WITH ALVEOLOPLASTY N/A 12/31/2019   Procedure: Extraction of tooth #'s 16-17, 20-23, and 28-30 with alveoloplasty, Bilateral mandibular tori reductions, and lateral exostoses reductions.;  Surgeon: Charlynne PanderKulinski, Ronald F, DDS;  Location: [facility] OR;  Service: Oral Surgery;  Laterality: N/A;  . RIGHT/LEFT HEART CATH AND CORONARY ANGIOGRAPHY N/A 12/16/2019   Procedure: RIGHT/LEFT HEART CATH AND CORONARY ANGIOGRAPHY;  Surgeon: Yvonne KendallEnd, Christopher, MD;  Location: [facility] INVASIVE CV LAB;  Service: Cardiovascular;  Laterality: N/A;  . TEE WITHOUT CARDIOVERSION N/A 12/16/2019   Procedure: TRANSESOPHAGEAL ECHOCARDIOGRAM (  TEE);  Surgeon: [name], MD;  Location[facility] ENDOSCOPY;  Service: Cardiology;  Laterality: N/A;  . TEE WITHOUT CARDIOVERSION N/A 03/06/2020   Procedure: TRANSESOPHAGEAL ECHOCARDIOGRAM (TEE);  Surgeon: Purcell Nails, MD;  Location: [facility];  Service: Open Heart Surgery;  Laterality: N/A;     Current Outpatient Medications  Medication Sig Dispense Refill  . aspirin EC 81 MG tablet Take 1 tablet (81 mg total) by mouth daily.    . ferrous sulfate 325 (65 FE) MG EC tablet Take 1 tablet (325 mg total) by mouth 2 (two) times daily. 180 tablet 1  . senna-docusate (SENOKOT-S) 8.6-50 MG tablet Take 1 tablet by mouth 2 (two) times daily between meals as needed for mild constipation or moderate constipation. 180 tablet 0  . furosemide (LASIX) 40 MG tablet Take 1 tablet (40 mg total) by mouth 2 (two) times daily. 180 tablet 3  . metoprolol tartrate (LOPRESSOR) 50 MG tablet TAKE 1 AND 1/2 TABLETS TWICE A DAY 270 tablet 3  . warfarin (COUMADIN) 5 MG tablet Take 1-1.5 tablets (5-7.5 mg total) by mouth See admin instructions. Take 1 tablet by mouth on Tues, Thurs, and Sat. All other days take 1 & 1/2 tablet. 60 tablet 1    No current facility-administered medications for this visit.    Allergies:   Patient has no known allergies.    Social History:  The patient  reports that he quit smoking about 13 months ago. His smoking use included cigarettes. He has a 35.00 pack-year smoking history. He has never used smokeless tobacco. He reports that he does not drink alcohol and does not use drugs.   Family History:  The patient's family history includes Hypertension in his father and mother; Stroke in his father.    ROS:  Please see the history of present illness.   Otherwise, review of systems are positive for none.   All other systems are reviewed and negative.    PHYSICAL EXAM: VS:  BP 110/68   Pulse 86   Ht 5\' 9"  (1.753 m)   Wt 172 lb 6.4 oz (78.2 kg)   BMI 25.46 kg/m  , BMI Body mass index is 25.46 kg/m. GENERAL:  Well appearing.  No acute distress HEENT: Pupils equal round and reactive, fundi not visualized, oral mucosa unremarkable NECK:  No jugular venous distention, waveform within normal limits, carotid upstroke brisk and symmetric, no bruits LUNGS:  Clear to auscultation bilaterally HEART:  RRR.  PMI not displaced or sustained, mechanical S1 and normal S2 within normal limits, no S3, no S4, no clicks, no rubs, II/VI systolic murmur at the apex and LUSB.   ABD:  Flat, positive bowel sounds normal in frequency in pitch, no bruits, no rebound, no guarding, no midline pulsatile mass, no hepatomegaly, no splenomegaly EXT:  2 plus pulses throughout, no edema, no cyanosis no clubbing SKIN:  No rashes no nodules NEURO:  Cranial nerves II through XII grossly intact, motor grossly intact throughout PSYCH:  Cognitively intact, oriented to person place and time   EKG:  EKG is ordered today. The ekg ordered 12/15/16 demonstrates sinus rhythm.  Rate 81 bpm.  PACs.  LVH with repolariazation abnormality.  08/15/17: Sinus rhythm.  Rate 76 bpm.  LVH with secondary repolarization abnormality. 06/06/18: Sinus  rhythm.  Rate 73 bpm.  LVH. 08/29/2019: Sinus rhythm.  PAC.  Rate 75 bpm.  Left atrial enlargement.  Inferior T wave flattening. 12/10/19: Sinus rhythm.  PACs.  Rate 85 bpm  LVH with  repolarization abnormality 01/13/2021: Sinus rhythm.  PACs.  Rate 86 bpm.  LVH with secondary repolarization abnormality.  Echo 08/08/16: Study Conclusions  - Left ventricle: The cavity size was normal. There was mild focal   basal hypertrophy of the septum. Systolic function was normal.   The estimated ejection fraction was in the range of 60% to 65%.   Wall motion was normal; there were no regional wall motion   abnormalities. Features are consistent with a pseudonormal left   ventricular filling pattern, with concomitant abnormal relaxation   and increased filling pressure (grade 2 diastolic dysfunction).   Doppler parameters are consistent with elevated ventricular   end-diastolic filling pressure. - Aortic valve: Transvalvular velocity was minimally increased.   There was mild stenosis. There was mild regurgitation. Peak   gradient (S): 25 mm Hg. - Aortic root: The aortic root was normal in size. - Mitral valve: Severely thickened and mildly calcified mitral   valve that is rheumatic in apearance. There is limited leaflet   separation with severe mitral stenosis and moderate   regurgitation. The findings are consistent with severe stenosis.   There was moderate regurgitation. Mean gradient (D): 17 mm Hg.   Peak gradient (D): 40 mm Hg. - Left atrium: The atrium was severely dilated. - Right ventricle: Systolic function was normal. - Tricuspid valve: There was mild regurgitation. - Pulmonic valve: There was no regurgitation. - Pulmonary arteries: Systolic pressure was within the normal   range. - Inferior vena cava: The vessel was normal in size. - Pericardium, extracardiac: There was no pericardial effusion.  Impressions:  - Severely thickened and mildly calcified mitral valve that is    rheumatic in apearance. There is limited leaflet eparation with   severe mitral stenosis and moderate regurgitation.  Echo 08/14/19: IMPRESSIONS   1. Left ventricular ejection fraction, by visual estimation, is 60 to 65%. The left ventricle has normal function. There is mildly increased left ventricular hypertrophy.  2. Left ventricular diastolic parameters are indeterminate.  3. Global right ventricle has normal systolic function.The right ventricular size is normal. No increase in right ventricular wall thickness.  4. Left atrial size was normal.  5. Right atrial size was normal.  6. Severe calcification of the mitral valve leaflet(s).  7. Severe thickening of the mitral valve leaflet(s).  8. Severely decreased mobility of the mitral valve leaflets.  9. The mitral valve is rheumatic. Trace mitral valve regurgitation. Moderate-severe mitral stenosis. 10. The tricuspid valve is normal in structure. Tricuspid valve regurgitation is trivial. 11. The aortic valve is tricuspid. Aortic valve regurgitation is not visualized. moderate sclerosis. 12. Small gradient across aortic valve without stenosis. 13. The pulmonic valve was grossly normal. Pulmonic valve regurgitation is trivial. 14. The inferior vena cava is normal in size with greater than 50% respiratory variability, suggesting right atrial pressure of 3 mmHg.  Echo 05/08/20: 1. No significant change from echo done June 2021.  2. Low normal GLS -14. Left ventricular ejection fraction, by estimation,  is 60 to 65%. The left ventricle has normal function. The left ventricle  has no regional wall motion abnormalities. There is mild left ventricular  hypertrophy. Left ventricular  diastolic parameters are indeterminate.  3. Right ventricular systolic function is normal. The right ventricular  size is normal.  4. Left atrial size was severely dilated.  5. Mechanical MV replacement with stable diastolic gradients and no  residual MR or  PVL. The mitral valve has been repaired/replaced. No  evidence of mitral valve regurgitation.  No evidence of mitral stenosis.  There is a Medtronic mechanical valve present  in the mitral position. Procedure Date: 03/06/20.  6. The aortic valve is tricuspid. Aortic valve regurgitation is mild.  Mild aortic valve stenosis.  7. The inferior vena cava is normal in size with greater than 50%  respiratory variability, suggesting right atrial pressure of 3 mmHg.   Comparison(s): 03/11/20 EF 60-65%. MV mean.    Recent Labs: 01/03/2021: B Natriuretic Peptide 649.8 01/06/2021: ALT 34 01/07/2021: Platelets 416; TSH 0.890 01/08/2021: BUN 16; Creatinine, Ser 1.02; Hemoglobin 8.3; Magnesium 2.0; Potassium 4.5; Sodium 136    Lipid Panel    Component Value Date/Time   CHOL 157 06/01/2016 1756   TRIG 130 12/04/2020 0617   HDL 47 06/01/2016 1756   CHOLHDL 3.3 06/01/2016 1756   VLDL 26 06/01/2016 1756   LDLCALC 84 06/01/2016 1756      Wt Readings from Last 3 Encounters:  01/13/21 172 lb 6.4 oz (78.2 kg)  01/04/21 167 lb 1.7 oz (75.8 kg)  12/30/20 172 lb (78 kg)      ASSESSMENT AND PLAN:  # Rheumatic valve disease: # s/p mechanical mitral valve: # Moderate mitral regurgitation: # Mild aortic stenosis: # Acute on chronic diastolic heart failure:  Mr. Mcmann had severe mitral stenosis and moderate mitral regurgitation.  He underwent mitral valve replacement and is doing well.  He is euvolemic.  Mean gradient was 5 mmHg and he had no mitral regurgitation. INR goal 2.5-3.5.  Continue aspirin.  He needs antibiotics before dental procedures.  Unfortunately he has had multiple readmissions for acute on chronic diastolic heart failure and COVID-19.  He has still not recovered.  He does not appear very volume overloaded on exam.  However he has had shortness of breath and weight gain.  We will increase his Lasix back up to 40 mg twice daily.  We will repeat a basic metabolic panel when he follows  up in a couple weeks.  Increase metoprolol to 75 mg twice daily.  # Hypertension: Blood pressure is well-controlled.  Increase Lasix and metoprolol as above.  # NSVT:   Symptoms were worse on nebivolol.  He continues to have frequent palpitations.  We will increase metoprolol to 75 mg twice daily.  Check a CBC to ensure he has not had worsening anemia.  # Acute blood loss anemia:   Patient presented to the ED with a supratherapeutic INR and a drop in his hemoglobin requiring transfusion.  Given his symptoms we will repeat a CBC today.  Continue iron supplementation.  He must continue on aspirin and warfarin for his mechanical mitral valve.  # Tobacco abuse: He was congratulated on smoking cessation.  # Paroxysmal atrial fibrillation:  This was postoperative.  He is currently in sinus rhythm.  Increase metoprolol as above.  Continue warfarin.  3-day ZIO monitor as above.   Current medicines are reviewed at length with the patient today.  The patient does not have concerns regarding medicines.  The following changes have been made:    Labs/ tests ordered today include:   Orders Placed This Encounter  Procedures  . LONG TERM MONITOR (3-14 DAYS)  . EKG 12-Lead     Disposition:   FU with Lashunta Frieden C. Duke Salvia, MD, [facility] in 6 months.     Signed, Cydni Reddoch C. Duke Salvia, MD, [facility]  01/13/2021 3:53 PM    [facility]

## 2021-01-13 NOTE — Patient Instructions (Addendum)
Medication Instructions:  INCREASE YOUR METOPROLOL TO 25 MG 1 AND 1/2 TABLETS TWICE A DAY   INCREASE YOUR FUROSEMIDE TO 40 MG TWICE A DAY   *If you need a refill on your cardiac medications before your next appointment, please call your pharmacy*  Lab Work: NONE  Testing/Procedures: 3 DAY ZIO  THIS WILL BE MAILED TO YOU  Follow-Up: At [facility], you and your health needs are our priority.  As part of our continuing mission to provide you with exceptional heart care, we have created designated Provider Care Teams.  These Care Teams include your primary Cardiologist (physician) and Advanced Practice Providers (APPs -  Physician Assistants and Nurse Practitioners) who all work together to provide you with the care you need, when you need it.  We recommend signing up for the patient portal called "MyChart".  Sign up information is provided on this After Visit Summary.  MyChart is used to connect with patients for Virtual Visits (Telemedicine).  Patients are able to view lab/test results, encounter notes, upcoming appointments, etc.  Non-urgent messages can be sent to your provider as well.   To learn more about what you can do with MyChart, go to [email or web address].    Your next appointment:   02/01/2021 AT 10:00 AM    ZIO XT- Long Term Monitor Instructions   Your physician has requested you wear your ZIO patch monitor__3__days.   This is a single patch monitor.  [employer] supplies one patch monitor per enrollment.  Additional stickers are not available.   Please do not apply patch if you will be having a Nuclear Stress Test, Echocardiogram, Cardiac CT, MRI, or Chest Xray during the time frame you would be wearing the monitor. The patch cannot be worn during these tests.  You cannot remove and re-apply the ZIO XT patch monitor.   Your ZIO patch monitor will be sent USPS Priority mail from [facility] directly to your home address. The monitor may also be mailed to a PO BOX  if home delivery is not available.   It may take 3-5 days to receive your monitor after you have been enrolled.   Once you have received you monitor, please review enclosed instructions.  Your monitor has already been registered assigning a specific monitor serial # to you.   Applying the monitor   Shave hair from upper left chest.   Hold abrader disc by orange tab.  Rub abrader in 40 strokes over left upper chest as indicated in your monitor instructions.   Clean area with 4 enclosed alcohol pads .  Use all pads to assure are is cleaned thoroughly.  Let dry.   Apply patch as indicated in monitor instructions.  Patch will be place under collarbone on left side of chest with arrow pointing upward.   Rub patch adhesive wings for 2 minutes.Remove white label marked "1".  Remove white label marked "2".  Rub patch adhesive wings for 2 additional minutes.   While looking in a mirror, press and release button in center of patch.  A small green light will flash 3-4 times .  This will be your only indicator the monitor has been turned on.     Do not shower for the first 24 hours.  You may shower after the first 24 hours.   Press button if you feel a symptom. You will hear a small click.  Record Date, Time and Symptom in the Patient Log Book.   When you are ready to remove patch,  follow instructions on last 2 pages of Patient Log Book.  Stick patch monitor onto last page of Patient Log Book.   Place Patient Log Book in [place] box.  Use locking tab on box and tape box closed securely.  The [employer] has [employer] on it.  Please place in mailbox as soon as possible.  Your physician should have your test results approximately 7 days after the monitor has been mailed back to [facility].   Call [facility] Customer Care at [phone] if you have questions regarding your ZIO XT patch monitor.  Call them immediately if you see an orange light blinking on your monitor.   If your  monitor falls off in less than 4 days contact our Monitor department at [phone].  If your monitor becomes loose or falls off after 4 days call [employer] at [phone] for suggestions on securing your monitor.

## 2021-01-14 ENCOUNTER — Ambulatory Visit: Payer: 59

## 2021-01-14 ENCOUNTER — Encounter: Payer: Self-pay | Admitting: Radiology

## 2021-01-14 DIAGNOSIS — I480 Paroxysmal atrial fibrillation: Secondary | ICD-10-CM

## 2021-01-14 NOTE — Progress Notes (Signed)
Enrolled patient for a 3 day Zio XT

## 2021-01-19 ENCOUNTER — Ambulatory Visit: Payer: 59 | Admitting: General Practice

## 2021-01-24 ENCOUNTER — Emergency Department (HOSPITAL_COMMUNITY): Payer: 59

## 2021-01-24 ENCOUNTER — Inpatient Hospital Stay (HOSPITAL_COMMUNITY): Payer: 59

## 2021-01-24 ENCOUNTER — Other Ambulatory Visit: Payer: Self-pay

## 2021-01-24 DIAGNOSIS — I4721 Torsades de pointes: Secondary | ICD-10-CM

## 2021-01-24 DIAGNOSIS — I4901 Ventricular fibrillation: Principal | ICD-10-CM

## 2021-01-24 DIAGNOSIS — Z7982 Long term (current) use of aspirin: Secondary | ICD-10-CM

## 2021-01-24 DIAGNOSIS — Z953 Presence of xenogenic heart valve: Secondary | ICD-10-CM | POA: Diagnosis not present

## 2021-01-24 DIAGNOSIS — E872 Acidosis, unspecified: Secondary | ICD-10-CM

## 2021-01-24 DIAGNOSIS — Z4659 Encounter for fitting and adjustment of other gastrointestinal appliance and device: Secondary | ICD-10-CM

## 2021-01-24 DIAGNOSIS — N179 Acute kidney failure, unspecified: Secondary | ICD-10-CM | POA: Diagnosis not present

## 2021-01-24 DIAGNOSIS — A419 Sepsis, unspecified organism: Secondary | ICD-10-CM | POA: Diagnosis present

## 2021-01-24 DIAGNOSIS — Z20822 Contact with and (suspected) exposure to covid-19: Secondary | ICD-10-CM | POA: Diagnosis present

## 2021-01-24 DIAGNOSIS — I110 Hypertensive heart disease with heart failure: Secondary | ICD-10-CM | POA: Diagnosis present

## 2021-01-24 DIAGNOSIS — I214 Non-ST elevation (NSTEMI) myocardial infarction: Secondary | ICD-10-CM | POA: Diagnosis present

## 2021-01-24 DIAGNOSIS — I469 Cardiac arrest, cause unspecified: Secondary | ICD-10-CM

## 2021-01-24 DIAGNOSIS — T45515A Adverse effect of anticoagulants, initial encounter: Secondary | ICD-10-CM | POA: Diagnosis present

## 2021-01-24 DIAGNOSIS — R6521 Severe sepsis with septic shock: Secondary | ICD-10-CM | POA: Diagnosis present

## 2021-01-24 DIAGNOSIS — J9601 Acute respiratory failure with hypoxia: Secondary | ICD-10-CM | POA: Diagnosis present

## 2021-01-24 DIAGNOSIS — Z8616 Personal history of COVID-19: Secondary | ICD-10-CM

## 2021-01-24 DIAGNOSIS — Z515 Encounter for palliative care: Secondary | ICD-10-CM | POA: Diagnosis not present

## 2021-01-24 DIAGNOSIS — I462 Cardiac arrest due to underlying cardiac condition: Secondary | ICD-10-CM

## 2021-01-24 DIAGNOSIS — D589 Hereditary hemolytic anemia, unspecified: Secondary | ICD-10-CM | POA: Diagnosis present

## 2021-01-24 DIAGNOSIS — K7200 Acute and subacute hepatic failure without coma: Secondary | ICD-10-CM | POA: Diagnosis present

## 2021-01-24 DIAGNOSIS — Z87891 Personal history of nicotine dependence: Secondary | ICD-10-CM

## 2021-01-24 DIAGNOSIS — D689 Coagulation defect, unspecified: Secondary | ICD-10-CM | POA: Diagnosis present

## 2021-01-24 DIAGNOSIS — Z7901 Long term (current) use of anticoagulants: Secondary | ICD-10-CM | POA: Diagnosis not present

## 2021-01-24 DIAGNOSIS — X58XXXA Exposure to other specified factors, initial encounter: Secondary | ICD-10-CM | POA: Diagnosis present

## 2021-01-24 DIAGNOSIS — I2510 Atherosclerotic heart disease of native coronary artery without angina pectoris: Secondary | ICD-10-CM | POA: Diagnosis present

## 2021-01-24 DIAGNOSIS — J690 Pneumonitis due to inhalation of food and vomit: Secondary | ICD-10-CM | POA: Diagnosis present

## 2021-01-24 DIAGNOSIS — Z79899 Other long term (current) drug therapy: Secondary | ICD-10-CM | POA: Diagnosis not present

## 2021-01-24 DIAGNOSIS — I5032 Chronic diastolic (congestive) heart failure: Secondary | ICD-10-CM | POA: Diagnosis present

## 2021-01-24 DIAGNOSIS — I472 Ventricular tachycardia: Secondary | ICD-10-CM

## 2021-01-24 DIAGNOSIS — R570 Cardiogenic shock: Secondary | ICD-10-CM

## 2021-01-24 DIAGNOSIS — Z8249 Family history of ischemic heart disease and other diseases of the circulatory system: Secondary | ICD-10-CM

## 2021-01-24 DIAGNOSIS — Z823 Family history of stroke: Secondary | ICD-10-CM | POA: Diagnosis not present

## 2021-01-24 DIAGNOSIS — Z7189 Other specified counseling: Secondary | ICD-10-CM | POA: Diagnosis not present

## 2021-01-24 DIAGNOSIS — E876 Hypokalemia: Secondary | ICD-10-CM | POA: Diagnosis present

## 2021-01-24 DIAGNOSIS — G9341 Metabolic encephalopathy: Secondary | ICD-10-CM | POA: Diagnosis present

## 2021-01-24 DIAGNOSIS — I480 Paroxysmal atrial fibrillation: Secondary | ICD-10-CM | POA: Diagnosis present

## 2021-01-24 DIAGNOSIS — Z0189 Encounter for other specified special examinations: Secondary | ICD-10-CM

## 2021-01-24 LAB — I-STAT CHEM 8, ED
BUN: 9 mg/dL (ref 6–20)
Calcium, Ion: 1.14 mmol/L — ABNORMAL LOW (ref 1.15–1.40)
Chloride: 106 mmol/L (ref 98–111)
Creatinine, Ser: 1.1 mg/dL (ref 0.61–1.24)
Glucose, Bld: 161 mg/dL — ABNORMAL HIGH (ref 70–99)
HCT: 32 % — ABNORMAL LOW (ref 39.0–52.0)
Hemoglobin: 10.9 g/dL — ABNORMAL LOW (ref 13.0–17.0)
Potassium: 3.1 mmol/L — ABNORMAL LOW (ref 3.5–5.1)
Sodium: 141 mmol/L (ref 135–145)
TCO2: 23 mmol/L (ref 22–32)

## 2021-01-24 LAB — COMPREHENSIVE METABOLIC PANEL
ALT: 41 U/L (ref 0–44)
AST: 52 U/L — ABNORMAL HIGH (ref 15–41)
Albumin: 2.9 g/dL — ABNORMAL LOW (ref 3.5–5.0)
Alkaline Phosphatase: 87 U/L (ref 38–126)
Anion gap: 13 (ref 5–15)
BUN: 10 mg/dL (ref 6–20)
CO2: 18 mmol/L — ABNORMAL LOW (ref 22–32)
Calcium: 7.9 mg/dL — ABNORMAL LOW (ref 8.9–10.3)
Chloride: 109 mmol/L (ref 98–111)
Creatinine, Ser: 1.1 mg/dL (ref 0.61–1.24)
GFR, Estimated: >60 mL/min (ref >60)
Glucose, Bld: 150 mg/dL — ABNORMAL HIGH (ref 70–99)
Potassium: 3 mmol/L — ABNORMAL LOW (ref 3.5–5.1)
Sodium: 140 mmol/L (ref 135–145)
Total Bilirubin: 1.1 mg/dL (ref 0.3–1.2)
Total Protein: 6.4 g/dL — ABNORMAL LOW (ref 6.5–8.1)

## 2021-01-24 LAB — BLOOD GAS, ARTERIAL
Acid-base deficit: 18.9 mmol/L — ABNORMAL HIGH (ref 0.0–2.0)
Bicarbonate: 11.3 mmol/L — ABNORMAL LOW (ref 20.0–28.0)
FIO2: 100
O2 Saturation: 76.6 %
Patient temperature: 96.9
pCO2 arterial: 45.6 mmHg (ref 32.0–48.0)
pH, Arterial: 7.017 — CL (ref 7.350–7.450)
pO2, Arterial: 66.7 mmHg — ABNORMAL LOW (ref 83.0–108.0)

## 2021-01-24 LAB — PROTIME-INR
INR: 3.5 — ABNORMAL HIGH (ref 0.8–1.2)
INR: 5.4 (ref 0.8–1.2)
Prothrombin Time: 35 s — ABNORMAL HIGH (ref 11.4–15.2)
Prothrombin Time: 49.5 seconds — ABNORMAL HIGH (ref 11.4–15.2)

## 2021-01-24 LAB — CBC WITH DIFFERENTIAL/PLATELET
Abs Immature Granulocytes: 0.15 K/uL — ABNORMAL HIGH (ref 0.00–0.07)
Basophils Absolute: 0.2 K/uL — ABNORMAL HIGH (ref 0.0–0.1)
Basophils Relative: 1 %
Eosinophils Absolute: 0.1 K/uL (ref 0.0–0.5)
Eosinophils Relative: 0 %
HCT: 35.3 % — ABNORMAL LOW (ref 39.0–52.0)
Hemoglobin: 10 g/dL — ABNORMAL LOW (ref 13.0–17.0)
Immature Granulocytes: 1 %
Lymphocytes Relative: 18 %
Lymphs Abs: 3.7 K/uL (ref 0.7–4.0)
MCH: 24.5 pg — ABNORMAL LOW (ref 26.0–34.0)
MCHC: 28.3 g/dL — ABNORMAL LOW (ref 30.0–36.0)
MCV: 86.5 fL (ref 80.0–100.0)
Monocytes Absolute: 2.1 K/uL — ABNORMAL HIGH (ref 0.1–1.0)
Monocytes Relative: 10 %
Neutro Abs: 14 K/uL — ABNORMAL HIGH (ref 1.7–7.7)
Neutrophils Relative %: 70 %
Platelets: 281 K/uL (ref 150–400)
RBC: 4.08 MIL/uL — ABNORMAL LOW (ref 4.22–5.81)
RDW: 23.9 % — ABNORMAL HIGH (ref 11.5–15.5)
WBC: 20.1 K/uL — ABNORMAL HIGH (ref 4.0–10.5)
nRBC: 0.1 % (ref 0.0–0.2)

## 2021-01-24 LAB — POCT I-STAT 7, (LYTES, BLD GAS, ICA,H+H)
Acid-base deficit: 3 mmol/L — ABNORMAL HIGH (ref 0.0–2.0)
Acid-base deficit: 4 mmol/L — ABNORMAL HIGH (ref 0.0–2.0)
Bicarbonate: 23.7 mmol/L (ref 20.0–28.0)
Bicarbonate: 22.7 mmol/L (ref 20.0–28.0)
Calcium, Ion: 1.04 mmol/L — ABNORMAL LOW (ref 1.15–1.40)
Calcium, Ion: 1.11 mmol/L — ABNORMAL LOW (ref 1.15–1.40)
HCT: 36 % — ABNORMAL LOW (ref 39.0–52.0)
HCT: 34 % — ABNORMAL LOW (ref 39.0–52.0)
Hemoglobin: 12.2 g/dL — ABNORMAL LOW (ref 13.0–17.0)
Hemoglobin: 11.6 g/dL — ABNORMAL LOW (ref 13.0–17.0)
O2 Saturation: 87 %
O2 Saturation: 92 %
Patient temperature: 36
Patient temperature: 36.5
Potassium: 3.1 mmol/L — ABNORMAL LOW (ref 3.5–5.1)
Potassium: 2.6 mmol/L — CL (ref 3.5–5.1)
Sodium: 138 mmol/L (ref 135–145)
Sodium: 137 mmol/L (ref 135–145)
TCO2: 25 mmol/L (ref 22–32)
TCO2: 24 mmol/L (ref 22–32)
pCO2 arterial: 43.8 mmHg (ref 32.0–48.0)
pCO2 arterial: 45.8 mmHg (ref 32.0–48.0)
pH, Arterial: 7.336 — ABNORMAL LOW (ref 7.350–7.450)
pH, Arterial: 7.3 — ABNORMAL LOW (ref 7.350–7.450)
pO2, Arterial: 53 mmHg — ABNORMAL LOW (ref 83.0–108.0)
pO2, Arterial: 69 mmHg — ABNORMAL LOW (ref 83.0–108.0)

## 2021-01-24 LAB — BASIC METABOLIC PANEL
Anion gap: 24 — ABNORMAL HIGH (ref 5–15)
BUN: 15 mg/dL (ref 6–20)
CO2: 19 mmol/L — ABNORMAL LOW (ref 22–32)
Calcium: 8 mg/dL — ABNORMAL LOW (ref 8.9–10.3)
Chloride: 95 mmol/L — ABNORMAL LOW (ref 98–111)
Creatinine, Ser: 2.11 mg/dL — ABNORMAL HIGH (ref 0.61–1.24)
GFR, Estimated: 36 mL/min — ABNORMAL LOW (ref >60)
Glucose, Bld: 424 mg/dL — ABNORMAL HIGH (ref 70–99)
Potassium: 3.3 mmol/L — ABNORMAL LOW (ref 3.5–5.1)
Sodium: 138 mmol/L (ref 135–145)

## 2021-01-24 LAB — ECHOCARDIOGRAM COMPLETE
AR max vel: 1.65 cm2
AV Area VTI: 1.88 cm2
AV Area mean vel: 1.55 cm2
AV Mean grad: 5.5 mmHg
AV Peak grad: 9.8 mmHg
Ao pk vel: 1.57 m/s
Area-P 1/2: 3.6 cm2
Calc EF: 38.7 %
Height: 71 in
MV VTI: 1.01 cm2
P 1/2 time: 264 msec
S' Lateral: 3.2 cm
Single Plane A2C EF: 42.7 %
Single Plane A4C EF: 33.7 %

## 2021-01-24 LAB — COOXEMETRY PANEL
Carboxyhemoglobin: 1.8 % — ABNORMAL HIGH (ref 0.5–1.5)
Methemoglobin: 0.9 % (ref 0.0–1.5)
O2 Saturation: 64.4 %
Total hemoglobin: 15.4 g/dL (ref 12.0–16.0)

## 2021-01-24 LAB — LACTIC ACID, PLASMA
Lactic Acid, Venous: 8.8 mmol/L (ref 0.5–1.9)
Lactic Acid, Venous: >11 mmol/L (ref 0.5–1.9)

## 2021-01-24 LAB — RESP PANEL BY RT-PCR (FLU A&B, COVID) ARPGX2
Influenza A by PCR: NEGATIVE
Influenza B by PCR: NEGATIVE
SARS Coronavirus 2 by RT PCR: NEGATIVE

## 2021-01-24 LAB — APTT: aPTT: 67 seconds — ABNORMAL HIGH (ref 24–36)

## 2021-01-24 LAB — TROPONIN I (HIGH SENSITIVITY)
Troponin I (High Sensitivity): 110 ng/L (ref <18)
Troponin I (High Sensitivity): >27000 ng/L (ref <18)

## 2021-01-24 LAB — MAGNESIUM: Magnesium: 1.7 mg/dL (ref 1.7–2.4)

## 2021-01-24 LAB — MRSA PCR SCREENING: MRSA by PCR: NEGATIVE

## 2021-01-24 MED ORDER — MIDAZOLAM HCL 2 MG/2ML IJ SOLN: 2.0000 mg | Freq: Once | INTRAMUSCULAR | Status: DC

## 2021-01-24 MED ORDER — POTASSIUM CHLORIDE 10 MEQ/50ML IV SOLN
10.0000 meq | INTRAVENOUS | Status: AC
  Administered 2021-01-24 – 2021-01-25 (×3): 10 meq via INTRAVENOUS
  Filled 2021-01-24 (×2): qty 50

## 2021-01-24 MED ORDER — ASPIRIN 300 MG RE SUPP
300.0000 mg | RECTAL | Status: AC
  Administered 2021-01-24: 300 mg via RECTAL
  Filled 2021-01-24: qty 1

## 2021-01-24 MED ORDER — SODIUM CHLORIDE 0.9 % IV SOLN: 250.0000 mL | INTRAVENOUS | Status: DC

## 2021-01-24 MED ORDER — SODIUM CHLORIDE 0.9% FLUSH: 10.0000 mL | Freq: Two times a day (BID) | INTRAVENOUS | Status: DC

## 2021-01-24 MED ORDER — AMIODARONE HCL IN DEXTROSE 360-4.14 MG/200ML-% IV SOLN
60.0000 mg/h | INTRAVENOUS | Status: AC
  Administered 2021-01-24: 60 mg/h via INTRAVENOUS
  Filled 2021-01-24: qty 400
  Filled 2021-01-24 (×2): qty 200

## 2021-01-24 MED ORDER — POTASSIUM CHLORIDE 10 MEQ/100ML IV SOLN
10.0000 meq | INTRAVENOUS | Status: DC
  Administered 2021-01-24: 10 meq via INTRAVENOUS
  Filled 2021-01-24: qty 100

## 2021-01-24 MED ORDER — CLOPIDOGREL BISULFATE 75 MG PO TABS: 75.0000 mg | ORAL_TABLET | Freq: Every day | ORAL | Status: DC

## 2021-01-24 MED ORDER — CALCIUM GLUCONATE-NACL 1-0.675 GM/50ML-% IV SOLN
1.0000 g | Freq: Once | INTRAVENOUS | Status: AC
  Administered 2021-01-24: 1000 mg via INTRAVENOUS
  Filled 2021-01-24: qty 50

## 2021-01-24 MED ORDER — CHLORHEXIDINE GLUCONATE CLOTH 2 % EX PADS
6.0000 | MEDICATED_PAD | Freq: Every day | CUTANEOUS | Status: DC
  Administered 2021-01-24 – 2021-01-25 (×2): 6 via TOPICAL

## 2021-01-24 MED ORDER — "THROMBI-PAD 3""X3"" EX PADS"
1.0000 | MEDICATED_PAD | Freq: Once | CUTANEOUS | Status: AC
  Administered 2021-01-24: 1 via TOPICAL
  Filled 2021-01-24: qty 1

## 2021-01-24 MED ORDER — LORAZEPAM 2 MG/ML IJ SOLN
INTRAMUSCULAR | Status: AC
  Filled 2021-01-24: qty 1

## 2021-01-24 MED ORDER — SODIUM CHLORIDE 0.9 % IV SOLN
250.0000 mL | INTRAVENOUS | Status: DC
  Administered 2021-01-24: 250 mL via INTRAVENOUS

## 2021-01-24 MED ORDER — ASPIRIN 325 MG PO TABS: 325.0000 mg | ORAL_TABLET | Freq: Every day | ORAL | Status: DC

## 2021-01-24 MED ORDER — NOREPINEPHRINE 4 MG/250ML-% IV SOLN
2.0000 ug/min | INTRAVENOUS | Status: DC
  Administered 2021-01-24: 25 ug/min via INTRAVENOUS
  Filled 2021-01-24 (×2): qty 250

## 2021-01-24 MED ORDER — CHLORHEXIDINE GLUCONATE 0.12% ORAL RINSE (MEDLINE KIT)
15.0000 mL | Freq: Two times a day (BID) | OROMUCOSAL | Status: DC
  Administered 2021-01-24 – 2021-01-25 (×2): 15 mL via OROMUCOSAL

## 2021-01-24 MED ORDER — CLOPIDOGREL BISULFATE 300 MG PO TABS
300.0000 mg | ORAL_TABLET | Freq: Once | ORAL | Status: DC
  Filled 2021-01-24: qty 1

## 2021-01-24 MED ORDER — SODIUM CHLORIDE 0.9% FLUSH: 10.0000 mL | INTRAVENOUS | Status: DC | PRN

## 2021-01-24 MED ORDER — FAMOTIDINE IN NACL 20-0.9 MG/50ML-% IV SOLN
20.0000 mg | Freq: Two times a day (BID) | INTRAVENOUS | Status: DC
  Administered 2021-01-24 – 2021-01-25 (×2): 20 mg via INTRAVENOUS
  Filled 2021-01-24 (×2): qty 50

## 2021-01-24 MED ORDER — HEPARIN BOLUS VIA INFUSION
4000.0000 [IU] | INTRAVENOUS | Status: DC
  Filled 2021-01-24: qty 4000

## 2021-01-24 MED ORDER — MIDAZOLAM 50MG/50ML (1MG/ML) PREMIX INFUSION
2.0000 mg/h | INTRAVENOUS | Status: DC
  Administered 2021-01-24 – 2021-01-25 (×2): 2 mg/h via INTRAVENOUS
  Filled 2021-01-24 (×3): qty 50

## 2021-01-24 MED ORDER — SODIUM CHLORIDE 0.9 % IV SOLN
2.0000 g | Freq: Every day | INTRAVENOUS | Status: DC
  Filled 2021-01-24: qty 20

## 2021-01-24 MED ORDER — VASOPRESSIN 20 UNITS/100 ML INFUSION FOR SHOCK
0.0000 [IU]/min | INTRAVENOUS | Status: DC
  Administered 2021-01-24 – 2021-01-25 (×2): 0.03 [IU]/min via INTRAVENOUS
  Filled 2021-01-24 (×2): qty 100

## 2021-01-24 MED ORDER — FENTANYL 2500MCG IN NS 250ML (10MCG/ML) PREMIX INFUSION
100.0000 ug/h | INTRAVENOUS | Status: DC
  Administered 2021-01-24: 100 ug/h via INTRAVENOUS
  Filled 2021-01-24: qty 250

## 2021-01-24 MED ORDER — FENTANYL CITRATE (PF) 100 MCG/2ML IJ SOLN
INTRAMUSCULAR | Status: AC
  Administered 2021-01-24: 50 ug
  Filled 2021-01-24: qty 2

## 2021-01-24 MED ORDER — FENTANYL CITRATE (PF) 100 MCG/2ML IJ SOLN: 50.0000 ug | Freq: Once | INTRAMUSCULAR | Status: DC

## 2021-01-24 MED ORDER — MAGNESIUM SULFATE 2 GM/50ML IV SOLN
2.0000 g | Freq: Once | INTRAVENOUS | Status: DC
  Filled 2021-01-24: qty 50

## 2021-01-24 MED ORDER — NOREPINEPHRINE 16 MG/250ML-% IV SOLN
2.0000 ug/min | INTRAVENOUS | Status: DC
  Administered 2021-01-24: 2 ug/min via INTRAVENOUS
  Filled 2021-01-24: qty 250

## 2021-01-24 MED ORDER — SODIUM CHLORIDE 0.9 % IV SOLN: INTRAVENOUS | Status: DC

## 2021-01-24 MED ORDER — HEPARIN (PORCINE) 25000 UT/250ML-% IV SOLN
1000.0000 [IU]/h | INTRAVENOUS | Status: DC
  Filled 2021-01-24 (×2): qty 250

## 2021-01-24 MED ORDER — NOREPINEPHRINE 4 MG/250ML-% IV SOLN
0.0000 ug/min | INTRAVENOUS | Status: DC
  Administered 2021-01-24: 20 ug/min via INTRAVENOUS
  Filled 2021-01-24: qty 250

## 2021-01-24 MED ORDER — CHLORHEXIDINE GLUCONATE CLOTH 2 % EX PADS: 6.0000 | MEDICATED_PAD | Freq: Every day | CUTANEOUS | Status: DC

## 2021-01-24 MED ORDER — SODIUM BICARBONATE 8.4 % IV SOLN
INTRAVENOUS | Status: AC
  Administered 2021-01-24: 50 meq
  Filled 2021-01-24: qty 50

## 2021-01-24 MED ORDER — FENTANYL BOLUS VIA INFUSION
50.0000 ug | INTRAVENOUS | Status: DC | PRN
Start: 2021-01-24 — End: 2021-01-25
  Filled 2021-01-24: qty 50

## 2021-01-24 MED ORDER — NOREPINEPHRINE 4 MG/250ML-% IV SOLN: 0.0000 ug/min | INTRAVENOUS | Status: DC

## 2021-01-24 MED ORDER — SODIUM BICARBONATE 8.4 % IV SOLN
INTRAVENOUS | Status: DC
  Filled 2021-01-24: qty 1000
  Filled 2021-01-24: qty 150
  Filled 2021-01-24: qty 1000

## 2021-01-24 MED ORDER — ORAL CARE MOUTH RINSE
15.0000 mL | OROMUCOSAL | Status: DC
  Administered 2021-01-24 – 2021-01-25 (×6): 15 mL via OROMUCOSAL

## 2021-01-24 MED ORDER — DOPAMINE-DEXTROSE 3.2-5 MG/ML-% IV SOLN
0.0000 ug/kg/min | INTRAVENOUS | Status: DC
  Administered 2021-01-24: 5 ug/kg/min via INTRAVENOUS
  Administered 2021-01-25: 13 ug/kg/min via INTRAVENOUS
  Filled 2021-01-24: qty 250

## 2021-01-24 MED ORDER — CLOPIDOGREL BISULFATE 300 MG PO TABS: 300.0000 mg | ORAL_TABLET | Freq: Once | ORAL | Status: DC

## 2021-01-24 MED ORDER — LORAZEPAM 2 MG/ML IJ SOLN
2.0000 mg | Freq: Once | INTRAMUSCULAR | Status: AC
  Administered 2021-01-24: 2 mg via INTRAVENOUS

## 2021-01-24 MED ORDER — POTASSIUM CHLORIDE 20 MEQ PO PACK
20.0000 meq | PACK | ORAL | Status: AC
  Administered 2021-01-24 – 2021-01-25 (×2): 20 meq
  Filled 2021-01-24 (×2): qty 1

## 2021-01-24 MED ORDER — MIDAZOLAM BOLUS VIA INFUSION
2.0000 mg | INTRAVENOUS | Status: DC | PRN
  Filled 2021-01-24: qty 2

## 2021-01-24 MED ORDER — SODIUM BICARBONATE 8.4 % IV SOLN
75.0000 meq | Freq: Once | INTRAVENOUS | Status: AC
  Administered 2021-01-24: 75 meq via INTRAVENOUS

## 2021-01-24 MED ORDER — SODIUM BICARBONATE 8.4 % IV SOLN
INTRAVENOUS | Status: AC
  Administered 2021-01-24: 50 meq
  Filled 2021-01-24: qty 150

## 2021-01-24 MED ORDER — POTASSIUM CHLORIDE 10 MEQ/100ML IV SOLN: 10.0000 meq | INTRAVENOUS | Status: DC

## 2021-01-24 MED ORDER — AMIODARONE HCL IN DEXTROSE 360-4.14 MG/200ML-% IV SOLN
30.0000 mg/h | INTRAVENOUS | Status: DC
  Administered 2021-01-24 – 2021-01-25 (×2): 30 mg/h via INTRAVENOUS

## 2021-01-24 MED ORDER — FENTANYL CITRATE (PF) 100 MCG/2ML IJ SOLN: 100.0000 ug | Freq: Once | INTRAMUSCULAR | Status: DC

## 2021-01-24 NOTE — Progress Notes (Addendum)
eLink Physician-Brief Progress Note Patient Name: [place]. [place]ier DOB: Mar 23, 1966 MRN: [id number]   Date of Service  02/02/2021  HPI/Events of Note  PO2 53, Ca++ 1.04, K+ 3.1  eICU Interventions  PEEP increased by RT, Ca++ and K+ replaced per E-Link adult electrolyte replacement protocol. Repeat ABG at 11 pm.        Migdalia Dk 01/30/2021, 8:53 PM

## 2021-01-24 NOTE — ED Notes (Addendum)
Staff present: Rosette Reveal, RN Deloria Lair, RN Melody, RN Ladona Ridgel, RN Darl Pikes, RN Alvino Chapel, RN [place], NT [place], NT [place], NT [place], NT [place], [place], [place], [place] Dr. Charm Barges, Dr. Rush Landmark Pharmacist   1426 CPR started in WR  1428 Pt transferred to Res A and CPR continued 1429 1 mg Epi 1430 pulse check, shock delivered 1432 1 mg Epi 1432 pulse check, shock delivered 1433 Ca++  1433 pulse check, shock delivered,CPR continued 1434 Na+ Bicarb 1435 2g Mag 1437 pulse check, shock delivered, CPR continued 1437 1 mg Epi 1438 pulse check, shock delivered, CPR continued 1440 pulse check, shock delivered, CPR continued 1440 Pt intubated  1442 1 mg Epi 1446 2g Mag 1447 pulse check, shock delivered, CPR continued 1448 1 mg Epi 1451 pulse check, no shock indicated 1453 ROSC, CPR discontinued 1510 1 mg Ativan- Pt kicking and pulling at lines  1513 lost pulse- CPR initiated 1513 1 mg Epi 1515 pulse check, no shock indicated, CPR initiated 1517 1 mg Epi 1518 pulse check, ROSC  1539 lost pulse, CPR initiated again 1539 1 mg Epi 1540 Na+ Bicarb 1543 ROSC, CPR discontinued Pt transferred for head CT

## 2021-01-24 NOTE — ED Notes (Signed)
Patient emergency contract was call Audrea Muscat, [place]) [phone] and informed of patient condition.

## 2021-01-24 NOTE — Consult Note (Signed)
Cardiology Consultation:   Patient ID: Melvin Burns MRN: [id number]; DOB: Nov 16, 1965  Admit date: 12/05/2020 Date of Consult: 12/05/2020  PCP:  Chilton Siandolph, Tiffany, MD   [facility]  Cardiologist:  Chilton Siiffany [place], MD  Advanced Practice Provider:  No care team member to display Electrophysiologist:  None        Patient Profile:   Melvin Burns is a 55 y.o. 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 Meridee ScoreMichael Butler.  History of Present Illness:   [name] has been complaining of fatigue after 3/22 COVID-19 infection.  Received therapy, and IVF, some transitions to his BB.  Has recent FOBT+ with an INR of 5.  A Preventice monitor had been planned; patient started on ZioPatch after early April (not on his body at time of eval).  A plan was made to increase his BB and lasix given his symptoms and was seen 01/13/21.  Per chart review Dr. Charm BargesButler and confirmed in discussion with him:    "Patient presented with a chief complaint of chest pain.  While he was sitting in the lobby he collapsed out of his chair and hit the floor striking his head.  He had no signs of a pulse.  He was emergently transported back to the resuscitation room.  No other history is available at this time.  He has a known history of mitral valve replacement and is on Coumadin.  Does have a history of nonsustained V. tach.  His initial rhythm appeared to be torsades and was shocked.  Epi magnesium bicarb calcium and amiodarone along with CPR were all performed.  He was intubated."  Patient received ~ 4 shocks.  Epi and and 4 g Mg.  Noted to have hit his head quite hard during fall.  Strips reviewed WCT.  Patient had repeat  arrest post intubation PEA and received additional ACLS.  After first ROSC; patient was flailing his arms around and responsive.  Not responsive after second rosc.  During my assessment bradycardia-> loss of pulse.  ACLS and CPR started.  Epinephrine given, Bicarb Given, Levo-> 70.  Code started ~ 1538 with ROSC ~ 1542.  No shocks.  We transported patient to scanner for CT Head CT Spine.  Pending formal read; no gross bleed noted.  Besided Echo:  EV < 20% no Effusion/Tamponade.  Past Medical History:  Diagnosis Date  . Hypertension   . Incidental pulmonary nodule 01/08/2020   Multiple in right lung - need f/u CT 1 year due to h/o smoking  . Mild aortic stenosis   . Mitral valve stenosis, severe   . Moderate mitral regurgitation   . Pneumonia    x 1  . Rheumatic mitral stenosis with regurgitation    Echo 08/08/16:  Severe mitral stenosis (mean gradient 17 mmHg).  Moderate mitral regurgitation.  . 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 Wears glasses   . Wears partial dentures    upper    Past Surgical History:  Procedure Laterality Date  . BILATERAL KNEE ARTHROSCOPY    . BUBBLE STUDY  12/16/2019   Procedure: BUBBLE STUDY;  Surgeon: [name], MD;  Location: [facility] ENDOSCOPY;  Service: Cardiology;;  . MITRAL VALVE REPLACEMENT Right 03/06/2020   Procedure: MINIMALLY INVASIVE MITRAL VALVE (MV) REPLACEMENT using Carbomedics Optiform 33 MM Mitral Bioprosthetic Valve.;  Surgeon: Purcell Nails, MD;  Location: [facility] OR;  Service: Open Heart Surgery;  Laterality: Right;  . MULTIPLE EXTRACTIONS WITH ALVEOLOPLASTY N/A 12/31/2019   Procedure: Extraction of tooth #'s 16-17, 20-23, and 28-30 with alveoloplasty, Bilateral mandibular tori reductions, and lateral exostoses reductions.;  Surgeon: Charlynne Pander, DDS;  Location: [facility] OR;  Service: Oral Surgery;   Laterality: N/A;  . RIGHT/LEFT HEART CATH AND CORONARY ANGIOGRAPHY N/A 12/16/2019   Procedure: RIGHT/LEFT HEART CATH AND CORONARY ANGIOGRAPHY;  Surgeon: Yvonne Kendall, MD;  Location: [facility] INVASIVE CV LAB;  Service: Cardiovascular;  Laterality: N/A;  . TEE WITHOUT CARDIOVERSION N/A 12/16/2019   Procedure: TRANSESOPHAGEAL ECHOCARDIOGRAM (TEE);  Surgeon: [name], MD;  Location: [facility] ENDOSCOPY;  Service: Cardiology;  Laterality: N/A;  . TEE WITHOUT CARDIOVERSION N/A 03/06/2020   Procedure: TRANSESOPHAGEAL ECHOCARDIOGRAM (TEE);  Surgeon: Purcell Nails, MD;  Location: [facility] OR;  Service: Open Heart Surgery;  Laterality: N/A;      Inpatient Medications: Scheduled Meds: . LORazepam       Continuous Infusions: . sodium chloride    . DOPamine    . magnesium sulfate bolus IVPB    . norepinephrine (LEVOPHED) Adult infusion     PRN Meds:   Allergies:   No Known Allergies  Social History:   Social History   Socioeconomic History  . Marital status: Single    Spouse name: Not on file  . Number of children: Not on file  . Years of education: Not on file  . Highest education level: Not on file  Occupational History  . Occupation: [employer] account executive  Tobacco Use  . Smoking status: Former Smoker    Packs/day: 1.00    Years: 35.00    Pack years: 35.00    Types: Cigarettes    Quit date: 12/09/2019    Years since quitting: 1.1  . Smokeless tobacco: Never Used  Vaping Use  . Vaping Use: Never used  Substance and Sexual Activity  . Alcohol use: No  . Drug use: No  . Sexual activity: Yes    Partners: Female  Other Topics Concern  . Not on file  Social History Narrative   Pt is married.  He is a former Database administrator.             Social Determinants of Health   Financial Resource Strain: Not on file  Food Insecurity: Not on file  Transportation Needs: Not on file  Physical Activity: Not on file  Stress: Not on file  Social Connections: Not on file  Intimate Partner  Violence: Not on file    Family History:    Family History  Problem Relation Age of Onset  . Hypertension Mother   . Hypertension Father   . Stroke Father      ROS:  Unable to obtain- intubated  Physical Exam/Data:   Vitals:   Jan 28, 2021 1600 2021/01/28 1605 01/28/21 1609 01/28/21 1615  BP:  109/80 (!) 87/57 105/88  Pulse:  91 90 (!) 54  Resp: (!) 24 (!) 26 (!) 26 (!) 25  Temp:    97.7 F (36.5 C)  SpO2:    (!) 10%   No intake or output data in the 24 hours ending 28-Jan-2021 1619 Last 3 Weights 01/13/2021 01/04/2021 12/30/2020  Weight (lbs)  172 lb 6.4 oz 167 lb 1.7 oz 172 lb  Weight (kg) 78.2 kg 75.8 kg 78.019 kg     There is no height or weight on file to calculate BMI.  General:  Intubated not sedated without responsiveness HEENT: ET tube secure Lymph: no adenopathy Neck: no JVD Endocrine:  No thryomegaly Vascular: No carotid bruits; FA pulses 2+ bilaterally without bruits  Cardiac:  Distant heart sounds; did not hear click Lungs:  Mechanical breath sounds Abd: soft, nontender, no hepatomegaly  Ext: no edema Musculoskeletal:  No deformities, no movement Skin: warm and dry  Neuro:  No purposeful movement Psych:  Intubated  EKG:  The EKG was personally reviewed and demonstrates:  Slow AF new LBBB with PVC.   Zoll Monitor:  Has alternative LBBB and other PVC morphologies prior to PEA arrest  Relevant CV Studies: Cardiac Event Monitoring: Date:01/14/21 Results: Results Pending unable to review  Transthoracic Echocardiogram: Date: 02/06/2021 Results: IMPRESSIONS    1. Left ventricular ejection fraction, by estimation, is 60 to 65%. The  left ventricle has normal function. The left ventricle has no regional  wall motion abnormalities. There is mild left ventricular hypertrophy.  Left ventricular diastolic parameters  are indeterminate.  2. Right ventricular systolic function is normal. The right ventricular  size is normal.  3. Left atrial size was mildly dilated.   4. The mitral valve has been repaired/replaced. No evidence of mitral  valve regurgitation. No evidence of mitral stenosis. There is a 33 mm  Sorin CarboMedics mechanical valve present in the mitral position.  Procedure Date: 03/06/2020. Echo findings are  consistent with normal structure and function of the mitral valve  prosthesis.  5. The aortic valve is normal in structure. Aortic valve regurgitation is  moderate. No aortic stenosis is present.  6. The inferior vena cava is normal in size with greater than 50%  respiratory variability, suggesting right atrial pressure of 3 mmHg.   Transesophageal Echocardiogram: Date:12/16/19 Pre Surgery Results: 1. Left ventricular ejection fraction, by estimation, is 60 to 65%. The  left ventricle has normal function. The left ventricle has no regional  wall motion abnormalities.  2. Right ventricular systolic function is normal. The right ventricular  size is normal. moderately increased right ventricular wall thickness.  3. Left atrial size was severely dilated. No left atrial/left atrial  appendage thrombus was detected.  4. Right atrial size was mildly dilated.  5. Severe, rheumatic mitral valve stenosis. Commisural fusion. Diastolic  doming particularly of anterior mitral leaflet. Moderate leaflet  calcifications. Mild calcification of the submitral apparatus, somewhat  obscured by shadowing. Variable cycle  length from ectopy impacts quantitation. 3D planimetry MVA = 1 cm2. MVA by  PHT with regular R-R = 1.50 cm2. MVA by PISA using a radius of 1 cm and  angle of 120 deg = 0.9 cm2. MVA by continuity equation = 1.29 cm2. Mean  gradient 6 mmHg at HR of 71 bpm.  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  The mitral valve is abnormal. Mild mitral valve regurgitation. Severe  mitral stenosis.  6. The aortic valve is grossly normal. Aortic valve regurgitation is  trivial.  7. Agitated saline contrast  bubble study was negative, with no evidence  of any interatrial shunt.   NonCardiac CT (head): Date:1601 Results: IMPRESSION: CT of the head: No acute intracranial abnormality noted.  CT of the cervical spine: No acute abnormality noted in the cervical spine.  Patchy infiltrate in the right upper lobe posteriorly.  Left/Right Heart Catheterizations: Date:12/15/20 Results: 1. Two vessel coronary artery disease with 70% stenosis involving ostium of OM3.  There is mild disease involving the LAD and distal LCx. 2. Severe mitral stenosis (mean gradient 10 mmHg, valve area 1.3 cm^2). 3. Moderately elevated PCWP and normal LVEDP, consistent with mitral stenosis. 4. Mildly to moderately elevated right heart and pulmonary artery pressures. 5. Normal Fick cardiac output/index.    Laboratory Data:  High Sensitivity Troponin:  No results for input(s): TROPONINIHS in the last 720 hours.   Chemistry Recent Labs  Lab 01/17/2021 1445  NA 141  K 3.1*  CL 106  GLUCOSE 161*  BUN 9  CREATININE 1.10    No results for input(s): PROT, ALBUMIN, AST, ALT, ALKPHOS, BILITOT in the last 168 hours. Hematology Recent Labs  Lab 02/03/2021 1445  WBC 20.1*  RBC 4.08*  HGB 10.0*  10.9*  HCT 35.3*  32.0*  MCV 86.5  MCH 24.5*  MCHC 28.3*  RDW 23.9*  PLT 281   BNPNo results for input(s): BNP, PROBNP in the last 168 hours.  DDimer No results for input(s): DDIMER in the last 168 hours.   Radiology/Studies:  DG Chest Port 1 View  Result Date: 02/02/2021 CLINICAL DATA:  Chest pain and check line placement EXAM: PORTABLE CHEST 1 VIEW COMPARISON:  01/03/2021 FINDINGS: Cardiac shadow is stable. Postsurgical changes are again noted. Endotracheal tube and gastric catheter are noted in satisfactory position. Lungs are well aerated bilaterally. No focal infiltrate or effusion is seen. Previously seen interstitial edema has resolved in the interval. IMPRESSION: Tubes and lines as described above. Resolved  interstitial edema Electronically Signed   By: Alcide Clever M.D.   On: 01/12/2021 15:36     Assessment and Plan:   Presumed NSTEMI (1st troponin 110) WCT- Pulseless VT Mixed Picture Shock- favoring Cardiogenic Shock Rheumatic MS, AS s/p Mechanical Mitral Valve - Down time significant; on high dose pressors; if has another arrest his likelihood of favorable discharge outcome becomes even lower - planned as transfer to [facility] and if recovers would need PCI - would be appropriate for Dopamine 5 mcg, Amiodarone Bolus and Drip (received Bolus during first arrest), Vasopressin 0.4 - discussed with internationalist on call; despite young age concern that he needs to show some signs of recovery to proceed with LHC - Pending labs (INR, troponin curve) - Attempted to call friend and emergency contact: left VM noting that when he made it to [facility] the medical team would update on his status  Discussed with Dr. Charm Barges, Dr. Katrinka Blazing and EM team  CRITICAL CARE Performed by: Onnie Hatchel A Evanie Buckle  Total critical care time: 110 minutes. Critical care time was exclusive of separately billable procedures and treating other patients. Critical care was necessary to treat or prevent imminent or life-threatening deterioration. Critical care was time spent personally by me on the following activities: development of treatment plan with patient and/or surrogate as well as nursing, discussions with consultants, evaluation of patient's response to treatment, examination of patient, obtaining history from patient or surrogate, ordering and performing treatments and interventions, ordering and review of laboratory studies, ordering and review of radiographic studies, pulse oximetry and re-evaluation of patient's condition.    Signed, Riley Lam, MD [place]  [facility]  01/10/2021 4:47 PM       Risk Assessment/Risk Scores:    For questions or updates, please contact [facility] Please consult  [email or web address] for contact info under    Signed, Christell Constant, MD  01/26/2021  4:19 PM

## 2021-01-24 NOTE — ED Notes (Signed)
Critical lactic acid reported: 8.8

## 2021-01-24 NOTE — Procedures (Signed)
Arterial Line Insertion Procedure Note Melvin Burns [id number] Dec 27, 1965  Procedure: Insertion of Arterial Line Indications: blood pressure monitoring  Procedure Details Consent: Unable to obtain consent because of emergent medical necessity. Time Out: Verified patient identification, verified procedure, site/side was marked, verified correct patient position, special equipment/implants available, medications/allergies/relevent history reviewed, required imaging and test results available.  Performed  Maximum sterile technique was used including antiseptics, cap, gloves, gown, hand hygiene, mask and sheet. Skin prep: Chlorhexidine; local anesthetic administered A single lumen arterial catheter was placed in the right femoral artery using the Seldinger technique.   Ultrasound was used to verify the patency of the artery and for real time needle guidance.  Evaluation Blood flow good Complications: No apparent complications Patient did tolerate procedure well.  Melvin Burns 2021/02/05, 6:04 PM

## 2021-01-24 NOTE — Progress Notes (Signed)
[facility]  Record reviewed Had CAD on LHC last year pre-mitral valve surgery I don't think he had a PE today, but he did have his INR reversed a few weeks ago; has been on warfarin since and INR is therapeutic today so PE less likey Favor primary ischemic event given chest pain > v-fib > STEMI changes on 12 lead Have asked for a formal echo tonight to check RV function  Heber Louann, MD [place] [facility] Pager: [phone] Cell: [phone] If no response, please call [phone] until 7pm After 7:00 pm call Elink  [phone]

## 2021-01-24 NOTE — ED Provider Notes (Signed)
[facility]-EMERGENCY DEPT Provider Note   CSN: [id number] Arrival date & time: 01/08/2021  1421     History No chief complaint on file.   Melvin Burns is a 55 y.o. male.  Patient presented with a chief complaint of chest pain.  While he was sitting in the lobby he collapsed out of his chair and hit the floor striking his head.  He had no signs of a pulse.  He was emergently transported back to the resuscitation room.  No other history is available at this time.  He has a known history of mitral valve replacement and is on Coumadin.  Does have a history of nonsustained V. tach.  His initial rhythm appeared to be torsades and was shocked.  Epi magnesium bicarb calcium and amiodarone along with CPR were all performed.  He was intubated.  History provided by: ed personel. The history is limited by the condition of the patient.  Cardiac Arrest Witnessed by:  Healthcare provider Incident location: ed waiting room. Time before BLS initiated:  Immediate Time before ALS initiated:  Immediate Condition upon EMS arrival:  Agonal respirations Pulse:  Absent Initial cardiac rhythm per EMS:  Torsades de pointes Airway:  Intubation in ED Rhythm on admission to ED:  Unchanged Associated symptoms: chest pain   Risk factors: head injury and heart problem        Past Medical History:  Diagnosis Date  . Hypertension   . Incidental pulmonary nodule 01/08/2020   Multiple in right lung - need f/u CT 1 year due to h/o smoking  . Mild aortic stenosis   . Mitral valve stenosis, severe   . Moderate mitral regurgitation   . Pneumonia    x 1  . Rheumatic mitral stenosis with regurgitation    Echo 08/08/16:  Severe mitral stenosis (mean gradient 17 mmHg).  Moderate mitral regurgitation.  . 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 Wears  glasses   . Wears partial dentures    upper    Patient Active Problem List   Diagnosis Date Noted  . NSVT (nonsustained ventricular tachycardia) (HCC) 01/08/2021  . 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 Symptomatic anemia 01/04/2021  . AKI (acute kidney injury) (HCC) 01/04/2021  . Hypokalemia 01/04/2021  . Supraventricular arrhythmia 01/04/2021  . Acute CHF (congestive heart failure) (HCC) 01/04/2021  . CHF exacerbation (HCC) 12/18/2020  . Leukocytosis 12/18/2020  . Acute respiratory failure with hypoxia (HCC) 12/04/2020  . COVID-19 virus infection 12/04/2020  . PAF (paroxysmal atrial fibrillation) (HCC) 03/30/2020  . Bacteremia due to Enterobacter species 03/13/2020  . IV site infection (HCC) 03/13/2020  . 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 Incidental pulmonary nodule 01/08/2020  . Pre-operative clearance 06/11/2019  . Mitral valve stenosis, severe 08/15/2017  . Moderate mitral regurgitation 08/15/2017  . Abdominal pain, epigastric 01/02/2017  . Non-intractable vomiting with nausea 01/02/2017  . 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 Family history of stroke 03/11/2013  . Essential hypertension 03/11/2013  . Smoker 03/11/2013  . Chronic periodontitis, unspecified 03/11/2013    Past Surgical History:  Procedure Laterality Date  . BILATERAL KNEE ARTHROSCOPY    . BUBBLE STUDY  12/16/2019   Procedure: BUBBLE STUDY;  Surgeon: [name], MD;  Location: [facility] ENDOSCOPY;  Service: Cardiology;;  . MITRAL VALVE REPLACEMENT Right 03/06/2020  Procedure: MINIMALLY INVASIVE MITRAL VALVE (MV) REPLACEMENT using Carbomedics Optiform 33 MM Mitral Bioprosthetic Valve.;  Surgeon: Purcell Nails, MD;  Location: [facility] OR;  Service: Open Heart Surgery;  Laterality: Right;  . MULTIPLE EXTRACTIONS WITH ALVEOLOPLASTY N/A 12/31/2019   Procedure: Extraction of tooth #'s 16-17, 20-23, and 28-30 with  alveoloplasty, Bilateral mandibular tori reductions, and lateral exostoses reductions.;  Surgeon: Charlynne Pander, DDS;  Location: [facility] OR;  Service: Oral Surgery;  Laterality: N/A;  . RIGHT/LEFT HEART CATH AND CORONARY ANGIOGRAPHY N/A 12/16/2019   Procedure: RIGHT/LEFT HEART CATH AND CORONARY ANGIOGRAPHY;  Surgeon: Yvonne Kendall, MD;  Location: [facility] INVASIVE CV LAB;  Service: Cardiovascular;  Laterality: N/A;  . TEE WITHOUT CARDIOVERSION N/A 12/16/2019   Procedure: TRANSESOPHAGEAL ECHOCARDIOGRAM (TEE);  Surgeon: [name], MD;  Location: [facility] ENDOSCOPY;  Service: Cardiology;  Laterality: N/A;  . TEE WITHOUT CARDIOVERSION N/A 03/06/2020   Procedure: TRANSESOPHAGEAL ECHOCARDIOGRAM (TEE);  Surgeon: Purcell Nails, MD;  Location: [facility] OR;  Service: Open Heart Surgery;  Laterality: N/A;       Family History  Problem Relation Age of Onset  . Hypertension Mother   . Hypertension Father   . Stroke Father     Social History   Tobacco Use  . Smoking status: Former Smoker    Packs/day: 1.00    Years: 35.00    Pack years: 35.00    Types: Cigarettes    Quit date: 12/09/2019    Years since quitting: 1.1  . Smokeless tobacco: Never Used  Vaping Use  . Vaping Use: Never used  Substance Use Topics  . Alcohol use: No  . Drug use: No    Home Medications Prior to Admission medications   Medication Sig Start Date End Date Taking? Authorizing Provider  aspirin EC 81 MG tablet Take 1 tablet (81 mg total) by mouth daily. 12/16/19   End, Cristal Deer, MD  ferrous sulfate 325 (65 FE) MG EC tablet Take 1 tablet (325 mg total) by mouth 2 (two) times daily. 01/08/21 07/07/21  Almon Hercules, MD  furosemide (LASIX) 40 MG tablet Take 1 tablet (40 mg total) by mouth 2 (two) times daily. 01/13/21 07/12/24  Chilton Si, MD  metoprolol tartrate (LOPRESSOR) 50 MG tablet TAKE 1 AND 1/2 TABLETS TWICE A DAY 01/13/21   Chilton Si, MD  senna-docusate (SENOKOT-S) 8.6-50 MG tablet Take 1 tablet by mouth 2 (two)  times daily between meals as needed for mild constipation or moderate constipation. 01/08/21   Almon Hercules, MD  warfarin (COUMADIN) 5 MG tablet Take 1-1.5 tablets (5-7.5 mg total) by mouth See admin instructions. Take 1 tablet by mouth on Tues, Thurs, and Sat. All other days take 1 & 1/2 tablet. 01/13/21   Chilton Si, MD    Allergies    Patient has no known allergies.  Review of Systems   Review of Systems  Unable to perform ROS: Patient unresponsive  Cardiovascular: Positive for chest pain.    Physical Exam Updated Vital Signs BP (!) 133/106   Pulse 91   Temp (!) 96.6 F (35.9 C)   Resp (!) 24   Ht  (1.803 m)   SpO2 98%   BMI 24.04 kg/m   Physical Exam Vitals and nursing note reviewed.  Constitutional:      Appearance: He is well-developed. He is ill-appearing.  HENT:     Head: Normocephalic and atraumatic.  Eyes:     Conjunctiva/sclera: Conjunctivae normal.  Cardiovascular:     Comments: No heart  sounds Pulmonary:     Comments: Equal breath sounds by ambu Abdominal:     Palpations: Abdomen is soft.     Tenderness: There is no abdominal tenderness. There is no guarding or rebound.  Musculoskeletal:        General: No deformity or signs of injury. Normal range of motion.     Cervical back: Neck supple.  Skin:    General: Skin is warm and dry.     Capillary Refill: Capillary refill takes more than 3 seconds.  Neurological:     Mental Status: He is unresponsive.     GCS: GCS eye subscore is 1. GCS verbal subscore is 1. GCS motor subscore is 1.     Comments: Patient is unresponsive on arrival.  There is some spontaneous breathing although not much effort.  He has not responded to any painful stimuli.     ED Results / Procedures / Treatments   Labs (all labs ordered are listed, but only abnormal results are displayed) Labs Reviewed  COMPREHENSIVE METABOLIC PANEL - Abnormal; Notable for the following components:      Result Value   Potassium 3.0 (*)     CO2 18 (*)    Glucose, Bld 150 (*)    Calcium 7.9 (*)    Total Protein 6.4 (*)    Albumin 2.9 (*)    AST 52 (*)    All other components within normal limits  LACTIC ACID, PLASMA - Abnormal; Notable for the following components:   Lactic Acid, Venous 8.8 (*)    All other components within normal limits  CBC WITH DIFFERENTIAL/PLATELET - Abnormal; Notable for the following components:   WBC 20.1 (*)    RBC 4.08 (*)    Hemoglobin 10.0 (*)    HCT 35.3 (*)    MCH 24.5 (*)    MCHC 28.3 (*)    RDW 23.9 (*)    Neutro Abs 14.0 (*)    Monocytes Absolute 2.1 (*)    Basophils Absolute 0.2 (*)    Abs Immature Granulocytes 0.15 (*)    All other components within normal limits  PROTIME-INR - Abnormal; Notable for the following components:   Prothrombin Time 35.0 (*)    INR 3.5 (*)    All other components within normal limits  BLOOD GAS, ARTERIAL - Abnormal; Notable for the following components:   pH, Arterial 7.017 (*)    pO2, Arterial 66.7 (*)    Bicarbonate 11.3 (*)    Acid-base deficit 18.9 (*)    All other components within normal limits  I-STAT CHEM 8, ED - Abnormal; Notable for the following components:   Potassium 3.1 (*)    Glucose, Bld 161 (*)    Calcium, Ion 1.14 (*)    Hemoglobin 10.9 (*)    HCT 32.0 (*)    All other components within normal limits  TROPONIN I (HIGH SENSITIVITY) - Abnormal; Notable for the following components:   Troponin I (High Sensitivity) 110 (*)    All other components within normal limits  RESP PANEL BY RT-PCR (FLU A&B, COVID) ARPGX2  MAGNESIUM  LACTIC ACID, PLASMA  COMPREHENSIVE METABOLIC PANEL  CBC WITH DIFFERENTIAL/PLATELET  TROPONIN I (HIGH SENSITIVITY)    EKG EKG Interpretation  Date/Time:  Sunday January 24 2021 14:56:32 EDT Ventricular Rate:  57 PR Interval:    QRS Duration: 136 QT Interval:  429 QTC Calculation: 418 R Axis:   -44 Text Interpretation: Atrial fibrillation Left bundle branch block Confirmed by Meridee ScoreButler, Juma Oxley  (  [id number]) on 07-Feb-2021 3:09:53 PM   Radiology CT Head Wo Contrast  Result Date: 02/07/2021 CLINICAL DATA:  Altered mental status following recent CPR EXAM: CT HEAD WITHOUT CONTRAST CT CERVICAL SPINE WITHOUT CONTRAST TECHNIQUE: Multidetector CT imaging of the head and cervical spine was performed following the standard protocol without intravenous contrast. Multiplanar CT image reconstructions of the cervical spine were also generated. COMPARISON:  None. FINDINGS: CT HEAD FINDINGS Brain: Slightly limited due to patient motion artifact. No evidence of acute infarction, hemorrhage, hydrocephalus, extra-axial collection or mass lesion/mass effect. Vascular: No hyperdense vessel or unexpected calcification. Skull: Normal. Negative for fracture or focal lesion. Sinuses/Orbits: No acute finding. Other: None. CT CERVICAL SPINE FINDINGS Alignment: Mild loss of the normal cervical lordosis is noted likely related to muscular spasm or positioning Skull base and vertebrae: Motion artifact is noted. Seven cervical segments are well visualized. Vertebral body height is well maintained. No acute fracture or acute facet abnormality is seen. The odontoid is within normal limits. Soft tissues and spinal canal: Surrounding soft tissue structures appear within normal limits. \ Upper chest: Visualized lung apices demonstrate mild airspace opacity right upper lobe new from the recent chest x-ray. Other: None IMPRESSION: CT of the head: No acute intracranial abnormality noted. CT of the cervical spine: No acute abnormality noted in the cervical spine. Patchy infiltrate in the right upper lobe posteriorly. Electronically Signed   By: Alcide Clever M.D.   On: 2021/02/07 16:18   CT CERVICAL SPINE WO CONTRAST  Result Date: 02/07/2021 CLINICAL DATA:  Altered mental status following recent CPR EXAM: CT HEAD WITHOUT CONTRAST CT CERVICAL SPINE WITHOUT CONTRAST TECHNIQUE: Multidetector CT imaging of the head and cervical spine was performed  following the standard protocol without intravenous contrast. Multiplanar CT image reconstructions of the cervical spine were also generated. COMPARISON:  None. FINDINGS: CT HEAD FINDINGS Brain: Slightly limited due to patient motion artifact. No evidence of acute infarction, hemorrhage, hydrocephalus, extra-axial collection or mass lesion/mass effect. Vascular: No hyperdense vessel or unexpected calcification. Skull: Normal. Negative for fracture or focal lesion. Sinuses/Orbits: No acute finding. Other: None. CT CERVICAL SPINE FINDINGS Alignment: Mild loss of the normal cervical lordosis is noted likely related to muscular spasm or positioning Skull base and vertebrae: Motion artifact is noted. Seven cervical segments are well visualized. Vertebral body height is well maintained. No acute fracture or acute facet abnormality is seen. The odontoid is within normal limits. Soft tissues and spinal canal: Surrounding soft tissue structures appear within normal limits. \ Upper chest: Visualized lung apices demonstrate mild airspace opacity right upper lobe new from the recent chest x-ray. Other: None IMPRESSION: CT of the head: No acute intracranial abnormality noted. CT of the cervical spine: No acute abnormality noted in the cervical spine. Patchy infiltrate in the right upper lobe posteriorly. Electronically Signed   By: Alcide Clever M.D.   On: 02/07/2021 16:18   DG Chest Port 1 View  Result Date: 01/30/2021 CLINICAL DATA:  Respiratory dependent.  Evaluate endotracheal tube. EXAM: PORTABLE CHEST 1 VIEW COMPARISON:  2021-02-07 FINDINGS: Endotracheal tube is 1.7 cm above the carina. Nasogastric tube extends into the abdomen but the tip is beyond the image. Slightly increased lung densities are most compatible with pulmonary edema. Heart size appears to be smaller in size. Patient has had previous mitral valve surgery. Cardiac pad over overlying the chest. Left subclavian central line in the upper SVC region.  Negative for a pneumothorax. IMPRESSION: 1. Increased interstitial pulmonary edema. 2. Heart size appears to  be smaller than the recent comparison examination. 3. Endotracheal tube is appropriately positioned. Electronically Signed   By: Richarda Overlie M.D.   On: 01/31/21 08:41   DG Chest Port 1 View  Result Date: 01/23/2021 CLINICAL DATA:  Central line placement. EXAM: PORTABLE CHEST 1 VIEW COMPARISON:  02/02/2021 FINDINGS: Endotracheal tube is in place with tip approximately 1.6 centimeters above the carina. LEFT-sided subclavian line tip overlies the level of superior vena cava. Stable cardiomegaly. Slightly prominent interstitial markings consistent with interstitial pulmonary edema. No new consolidations. No pneumothorax. IMPRESSION: Interval placement of LEFT-sided subclavian line. No pneumothorax. Electronically Signed   By: Norva Pavlov M.D.   On: 02/06/2021 17:28   DG Chest Port 1 View  Result Date: 01/27/2021 CLINICAL DATA:  Chest pain and check line placement EXAM: PORTABLE CHEST 1 VIEW COMPARISON:  01/03/2021 FINDINGS: Cardiac shadow is stable. Postsurgical changes are again noted. Endotracheal tube and gastric catheter are noted in satisfactory position. Lungs are well aerated bilaterally. No focal infiltrate or effusion is seen. Previously seen interstitial edema has resolved in the interval. IMPRESSION: Tubes and lines as described above. Resolved interstitial edema Electronically Signed   By: Alcide Clever M.D.   On: 01/08/2021 15:36   DG Abd Portable 1V  Result Date: 01/16/2021 CLINICAL DATA:  Check gastric catheter placement EXAM: PORTABLE ABDOMEN - 1 VIEW COMPARISON:  Film from earlier in the same day. FINDINGS: Gastric catheter is been placed and the stomach is now decompressed. No other focal abnormality is noted. IMPRESSION: Nasogastric catheter within the stomach. Electronically Signed   By: Alcide Clever M.D.   On: 01/20/2021 20:19   DG Abd Portable 1V  Result Date:  01/17/2021 CLINICAL DATA:  OG tube placement. EXAM: PORTABLE ABDOMEN - 1 VIEW COMPARISON:  Chest x-ray 02/04/2021 at 4:34 p.m. and at 3 o'clock p.m. FINDINGS: There is gaseous distension of the stomach. Despite history of orogastric tube placement, no tube is identified. Bowel gas pattern is nonobstructive. No evidence for free intraperitoneal air on this supine view. IMPRESSION: 1. No tube identified. 2. Gaseous distension of the stomach. Electronically Signed   By: Norva Pavlov M.D.   On: 02/03/2021 17:30   ECHOCARDIOGRAM COMPLETE  Result Date: 01/22/2021    ECHOCARDIOGRAM REPORT   Patient Name:   Melvin Burns Date of Exam: 01/12/2021 Medical Rec #:  [id number]           Height:       71.0 in Accession #:    [id number]          Weight:       172.4 lb Date of Birth:  02-13-1966          BSA:          1.980 m Patient Age:    54 years            BP:           133/106 mmHg Patient Gender: M                   HR:           77 bpm. Exam Location:  Inpatient Procedure: 2D Echo, Cardiac Doppler, Color Doppler and 3D Echo                         STAT ECHO Reported to: Dr Weston Brass on 01/19/2021 10:00:00 PM. Indications:    Cardiac arrest  History:  Patient has prior history of Echocardiogram examinations, most                 recent 12/04/2020. Mitral Valve Disease and Aortic Valve Disease;                 Risk Factors:Hypertension. 33 mm Sorin [employer]bo[employer]ics mechanical                 valve present in the mitral position. Procedure Date:                 03/06/2020.                  Mitral Valve: 33 mm Sorin [employer] mechanical valve valve is                 present in the mitral position. Procedure Date: 03/06/20.  Sonographer:    Ross Ludwig RDCS (AE) Referring Phys: [id number] Lupita Leash  Sonographer Comments: Technically difficult study due to poor echo windows. IMPRESSIONS  1. Left ventricular ejection fraction, by estimation, is 50 to 55%. The left ventricle has low normal function. The left  ventricle demonstrates regional wall motion abnormalities (see scoring diagram/findings for description). There is mild left ventricular hypertrophy. Left ventricular diastolic function could not be evaluated.  2. Right ventricular systolic function is mildly reduced. The right ventricular size is normal. Tricuspid regurgitation signal is inadequate for assessing PA pressure.  3. Left atrial size was moderately dilated.  4. The mitral valve has been repaired/replaced. Mild mitral valve regurgitation. The mean mitral valve gradient is 8.0 mmHg with average heart rate of 109 bpm. There is a 33 mm Sorin [employer]bo[employer] mechanical valve present in the mitral position. Procedure  Date: 03/06/20. Valve is not optimally visualized due to tachycardia and image quality. Leaflet motion appears grossly preserved and no definite dehiscence or regurgitation.  5. The aortic valve is grossly normal. There is mild calcification of the aortic valve. Aortic valve regurgitation is mild. Mild aortic valve sclerosis is present, with no evidence of aortic valve stenosis. FINDINGS  Left Ventricle: Left ventricular ejection fraction, by estimation, is 50 to 55%. The left ventricle has low normal function. The left ventricle demonstrates regional wall motion abnormalities. The left ventricular internal cavity size was normal in size. There is mild left ventricular hypertrophy. Left ventricular diastolic function could not be evaluated due to mitral valve replacement. Left ventricular diastolic function could not be evaluated.  LV Wall Scoring: The mid and distal anterior wall, apical lateral segment, and mid anterolateral segment are hypokinetic. Right Ventricle: The right ventricular size is normal. No increase in right ventricular wall thickness. Right ventricular systolic function is mildly reduced. Tricuspid regurgitation signal is inadequate for assessing PA pressure. Left Atrium: Left atrial size was moderately dilated. Right Atrium: Right  atrial size was normal in size. Pericardium: Trivial pericardial effusion is present. Mitral Valve: The mitral valve has been repaired/replaced. Mild mitral valve regurgitation. There is a 33 mm Sorin [employer]bo[employer] mechanical valve present in the mitral position. Procedure Date: 03/06/20. MV peak gradient, 20.1 mmHg. The mean mitral valve gradient is 8.0 mmHg with average heart rate of 109 bpm. Tricuspid Valve: The tricuspid valve is normal in structure. Tricuspid valve regurgitation is trivial. Aortic Valve: The aortic valve is grossly normal. There is mild calcification of the aortic valve. Aortic valve regurgitation is mild. Aortic regurgitation PHT measures 264 msec. Mild aortic valve sclerosis is present, with no evidence of aortic valve stenosis. Aortic valve  mean gradient measures 5.5 mmHg. Aortic valve peak gradient measures 9.8 mmHg. Aortic valve area, by VTI measures 1.88 cm. Pulmonic Valve: The pulmonic valve was not well visualized. Pulmonic valve regurgitation is trivial. Aorta: The aortic root is normal in size and structure. Venous: IVC assessment for right atrial pressure unable to be performed due to mechanical ventilation. IAS/Shunts: No atrial level shunt detected by color flow Doppler.  LEFT VENTRICLE PLAX 2D LVIDd:         4.30 cm LVIDs:         3.20 cm LV PW:         1.60 cm LV IVS:        1.20 cm LVOT diam:     1.90 cm      3D Volume EF: LV SV:         40           3D EF:        48 % LV SV Index:   20           LV EDV:       239 ml LVOT Area:     2.84 cm     LV ESV:       125 ml                             LV SV:        115 ml  LV Volumes (MOD) LV vol d, MOD A2C: 118.0 ml LV vol d, MOD A4C: 107.0 ml LV vol s, MOD A2C: 67.6 ml LV vol s, MOD A4C: 70.9 ml LV SV MOD A2C:     50.4 ml LV SV MOD A4C:     107.0 ml LV SV MOD BP:      44.6 ml RIGHT VENTRICLE            IVC RV Basal diam:  2.50 cm    IVC diam: 2.00 cm RV S prime:     7.62 cm/s TAPSE (M-mode): 1.2 cm LEFT ATRIUM             Index        RIGHT ATRIUM           Index LA diam:        2.95 cm 1.49 cm/m  RA Area:     15.40 cm LA Vol (A2C):   59.4 ml 30.01 ml/m RA Volume:   37.20 ml  18.79 ml/m LA Vol (A4C):   87.6 ml 44.25 ml/m LA Biplane Vol: 71.9 ml 36.32 ml/m  AORTIC VALVE AV Area (Vmax):    1.65 cm AV Area (Vmean):   1.55 cm AV Area (VTI):     1.88 cm AV Vmax:           156.75 cm/s AV Vmean:          108.250 cm/s AV VTI:            0.210 m AV Peak Grad:      9.8 mmHg AV Mean Grad:      5.5 mmHg LVOT Vmax:         91.40 cm/s LVOT Vmean:        59.240 cm/s LVOT VTI:          0.140 m LVOT/AV VTI ratio: 0.66 AI PHT:            264 msec  AORTA Ao Root diam: 2.90 cm Ao  Asc diam:  3.00 cm MITRAL VALVE MV Area (PHT): 3.60 cm     SHUNTS MV Area VTI:   1.01 cm     Systemic VTI:  0.14 m MV Peak grad:  20.1 mmHg    Systemic Diam: 1.90 cm MV Mean grad:  8.0 mmHg MV Vmax:       2.24 m/s MV Vmean:      138.0 cm/s MV Decel Time: 211 msec MV E velocity: 197.00 cm/s MV A velocity: 97.00 cm/s MV E/A ratio:  2.03 Weston Brass MD Electronically signed by Weston Brass MD Signature Date/Time: 01/28/2021/10:29:52 PM    Final     Procedures Procedure Name: Intubation Date/Time: 01/11/2021 4:04 PM Performed by: Terrilee Files, MD Pre-anesthesia Checklist: Patient identified, Patient being monitored, Emergency Drugs available, Timeout performed and Suction available Oxygen Delivery Method: Ambu bag Preoxygenation: Pre-oxygenation with 100% oxygen Ventilation: Two handed mask ventilation required Laryngoscope Size: Glidescope and 3 Grade View: Grade II Tube size: 7.5 mm Number of attempts: 1 Placement Confirmation: ETT inserted through vocal cords under direct vision,  CO2 detector and Breath sounds checked- equal and bilateral Dental Injury: Teeth and Oropharynx as per pre-operative assessment  Difficulty Due To: Difficulty was anticipated    CPR  Date/Time: 02/01/2021 4:05 PM Performed by: Terrilee Files, MD Authorized by: Terrilee Files, MD  CPR Procedure Details:      Amount of time prior to administration of ACLS/BLS (minutes):  0   CPR/ACLS performed in the ED: Yes     Duration of CPR (minutes):  40   Outcome: ROSC obtained    CPR performed via ACLS guidelines under my direct supervision.  See RN documentation for details including defibrillator use, medications, doses and timing. .Critical Care Performed by: Terrilee Files, MD Authorized by: Terrilee Files, MD   Critical care provider statement:    Critical care time (minutes):  120   Critical care was necessary to treat or prevent imminent or life-threatening deterioration of the following conditions:  Cardiac failure and shock   Critical care was time spent personally by me on the following activities:  Discussions with consultants, evaluation of patient's response to treatment, examination of patient, ordering and performing treatments and interventions, ordering and review of laboratory studies, ordering and review of radiographic studies, pulse oximetry, re-evaluation of patient's condition, obtaining history from patient or surrogate and review of old charts     Medications Ordered in ED Medications  LORazepam (ATIVAN) 2 MG/ML injection (  Canceled Entry 01/15/2021 1605)  0.9 %  sodium chloride infusion (has no administration in time range)  norepinephrine (LEVOPHED) 4mg  in premix infusion (35 mcg/min Intravenous Rate/Dose Change 02/03/2021 1644)  DOPamine (INTROPIN) 800 mg in dextrose 5 % 250 mL (3.2 mg/mL) infusion (20 mcg/kg/min  78.2 kg Intravenous Rate/Dose Change 01/08/2021 1630)  amiodarone (NEXTERONE PREMIX) 360-4.14 MG/200ML-% (1.8 mg/mL) IV infusion (60 mg/hr Intravenous New Bag/Given 01/14/2021 1716)    Followed by  amiodarone (NEXTERONE PREMIX) 360-4.14 MG/200ML-% (1.8 mg/mL) IV infusion (has no administration in time range)  vasopressin (PITRESSIN) 20 Units in sodium chloride 0.9 % 100 mL infusion-*FOR SHOCK* (0.03 Units/min Intravenous  New Bag/Given 01/12/2021 1727)  fentaNYL in NS (89mcg/ml) infusion-PREMIX (has no administration in time range)  midazolam (VERSED) 50 mg/50 mL (1 mg/mL) premix infusion (has no administration in time range)  fentaNYL (SUBLIMAZE) injection 50 mcg ( Intravenous Canceled Entry 01/08/2021 1708)  clopidogrel (PLAVIX) tablet 300 mg (has no administration in time  range)  aspirin tablet 325 mg (has no administration in time range)  clopidogrel (PLAVIX) tablet 75 mg (has no administration in time range)  sodium bicarbonate injection 75 mEq (has no administration in time range)  sodium bicarbonate 150 mEq in dextrose 5 % 1,150 mL infusion (has no administration in time range)  potassium chloride 10 mEq in 100 mL IVPB (has no administration in time range)  LORazepam (ATIVAN) injection 2 mg (2 mg Intravenous Given 02/02/2021 1529)  sodium bicarbonate 1 mEq/mL injection (50 mEq  Given 01/08/2021 1635)  fentaNYL (SUBLIMAZE) 100 MCG/2ML injection (50 mcg  Given 01/20/2021 1638)  fentaNYL (SUBLIMAZE) 100 MCG/2ML injection (50 mcg  Given 02/04/2021 1708)  sodium bicarbonate 1 mEq/mL injection (50 mEq  Given 01/26/2021 1741)    ED Course  I have reviewed the triage vital signs and the nursing notes.  Pertinent labs & imaging results that were available during my care of the patient were reviewed by me and considered in my medical decision making (see chart for details).  Clinical Course as of 01/20/2021 1740  Sun Jan 24, 2021  1600 Discussed with criticl care/  [MB]  1606 Cardiology came over from [facility], Dr Izora Ribas and helped assist with code and decision making.  Head CT does not show any obvious large bleed.  Currently on Levophed and dopamine. [MB]  1625 Critical care Dr. Kendrick Fries here to evaluate patient. [MB]  1709 Discussed with Dr. Katrinka Blazing from: STEMI attending.  He does not think the patient would benefit from a cath currently due to his prolonged CPR time. [MB]    Clinical Course User Index [MB]  Terrilee Files, MD   MDM Rules/Calculators/A&P                           Final Clinical Impression(s) / ED Diagnoses Final diagnoses:  Ventricular fibrillation [facility])  Cardiac arrest [facility])  Cardiogenic shock (HCC)  Metabolic acidosis    Rx / DC Orders ED Discharge Orders    None       Terrilee Files, MD Feb 17, 2021 [phone]

## 2021-01-24 NOTE — ED Notes (Signed)
CBG 170 

## 2021-01-24 NOTE — Progress Notes (Addendum)
ANTICOAGULATION CONSULT NOTE - Initial Consult  Pharmacy Consult for Heparin Indication: chest pain/ACS, hx of mechanical valve on chronic warfarin anticoagulation  No Known Allergies  Patient Measurements: Height: 5\' 11"  (180.3 cm) IBW/kg (Calculated) : 75.3 Heparin Dosing Weight: TBW, 78.2 kg  Vital Signs: Temp: 96.6 F (35.9 C) (04/17 1750) BP: 133/98 (04/17 1750) Pulse Rate: 91 (04/17 1648)  Labs: Recent Labs    01/22/2021 1445  HGB 10.0*  10.9*  HCT 35.3*  32.0*  PLT 281  LABPROT 35.0*  INR 3.5*  CREATININE 1.10  1.10  TROPONINIHS 110*    Estimated Creatinine Clearance: 79.9 mL/min (by C-G formula based on SCr of 1.1 mg/dL).   Medical History: Past Medical History:  Diagnosis Date  . Hypertension   . Incidental pulmonary nodule 01/08/2020   Multiple in right lung - need f/u CT 1 year due to h/o smoking  . Mild aortic stenosis   . Mitral valve stenosis, severe   . Moderate mitral regurgitation   . Pneumonia    x 1  . Rheumatic mitral stenosis with regurgitation    Echo 08/08/16:  Severe mitral stenosis (mean gradient 17 mmHg).  Moderate mitral regurgitation.  . 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 Wears glasses   . Wears partial dentures    upper    Medications:  (Not in a hospital admission)  Scheduled:  . aspirin  325 mg Oral Daily  . clopidogrel  300 mg Oral Once  . [START ON 02-19-21] clopidogrel  75 mg Oral Daily  . fentaNYL (SUBLIMAZE) injection  50 mcg Intravenous Once  . LORazepam       Infusions:  . sodium chloride    . amiodarone 60 mg/hr (01/14/2021 1716)   Followed by  . amiodarone    . cefTRIAXone (ROCEPHIN)  IV    . DOPamine 20 mcg/kg/min (02/05/2021 1630)  . fentaNYL infusion INTRAVENOUS 100 mcg/hr (01/23/2021 1753)  . midazolam    . norepinephrine (LEVOPHED) Adult infusion 30 mcg/min  (01/23/2021 1743)  . potassium chloride    . sodium bicarbonate 150 mEq in D5W infusion 100 mL/hr at 01/10/2021 1759  . vasopressin 0.03 Units/min (01/15/2021 1727)    Assessment: 66 yoM presented to ED on 4/17 with chest pain, cardiac arrest in ED waiting room.  He is on chronic warfarin anticoagulation for mechanical mitral valve replacement.  Pharmacy is consulted to dose Heparin for ACS. PTA warfarin 7.5 mg daily except 5 mg on Tues, Thurs, Saturdays.  Last dose unknown.  Admission INR 3.5 SCr 1.1 CBC:  Hgb 10, Plt 281  Goal of Therapy:  Heparin level 0.3-0.7 units/ml Monitor platelets by anticoagulation protocol: Yes   Plan:   Give heparin 4000 units bolus IV x 1  Start heparin IV infusion at 1000 units/hr  Heparin level 6 hours after starting  Daily heparin level and CBC   01-05-1991 PharmD, BCPS Clinical Pharmacist [facility] main pharmacy [phone] 01/26/2021 6:05 PM  Addum:  Heparin never started.  INR 5.4.  MD want to hold heparin for now.  Check INR at 10:00 01/26/2021, PharmD

## 2021-01-24 NOTE — Progress Notes (Signed)
  Amiodarone Drug - Drug Interaction Consult Note  Recommendations:  Amiodarone is metabolized by the cytochrome P450 system and therefore has the potential to cause many drug interactions. Amiodarone has an average plasma half-life of 50 days (range 20 to 100 days).   There is potential for drug interactions to occur several weeks or months after stopping treatment and the onset of drug interactions may be slow after initiating amiodarone.   []  Statins: Increased risk of myopathy. Simvastatin- restrict dose to 20mg  daily. Other statins: counsel patients to report any muscle pain or weakness immediately.  [x]  Anticoagulants: Amiodarone can increase anticoagulant effect. Consider warfarin dose reduction. Patients should be monitored closely and the dose of anticoagulant altered accordingly, remembering that amiodarone levels take several weeks to stabilize.    Patient is on chronic warfarin anticoagulation.  []  Antiepileptics: Amiodarone can increase plasma concentration of phenytoin, the dose should be reduced. Note that small changes in phenytoin dose can result in large changes in levels. Monitor patient and counsel on signs of toxicity.  [x]  Beta blockers: increased risk of bradycardia, AV block and myocardial depression. Sotalol - avoid concomitant use.  []   Calcium channel blockers (diltiazem and verapamil): increased risk of bradycardia, AV block and myocardial depression.  []   Cyclosporine: Amiodarone increases levels of cyclosporine. Reduced dose of cyclosporine is recommended.  []  Digoxin dose should be halved when amiodarone is started.  []  Diuretics: increased risk of cardiotoxicity if hypokalemia occurs.  []  Oral hypoglycemic agents (glyburide, glipizide, glimepiride): increased risk of hypoglycemia. Patient's glucose levels should be monitored closely when initiating amiodarone therapy.   []  Drugs that prolong the QT interval:  Torsades de pointes risk may be increased with  concurrent use - avoid if possible.  Monitor QTc, also keep magnesium/potassium WNL if concurrent therapy can't be avoided. Antibiotics: e.g. fluoroquinolones, erythromycin. . Antiarrhythmics: e.g. quinidine, procainamide, disopyramide, sotalol. . Antipsychotics: e.g. phenothiazines, haloperidol.  . Lithium, tricyclic antidepressants, and methadone.  Thank You,  PharmD, BCPS Clinical Pharmacist [facility] main pharmacy [phone] 2021/02/10 5:22 PM

## 2021-01-24 NOTE — ED Triage Notes (Signed)
Patient  Presented to ED lobby and checked in for chest pain. Patient was sitting in chair in lobby, started to slump down in chair. Security yelled to nurse Alvino Chapel L. To notify of patient condition. Nurse Alvino Chapel went to assess patient, patient fell completely out of lobby chair and hit back of head on floor. Patient unresponsive in lobby, Nurse Alvino Chapel L performed pulse check and noted patient was not breathing, cpr started immediately. Nurse ellen notified staff need for assistance. Several staff members came to lobby to assist, patient placed on stretcher and brought to resus A where life saving measures were started by several staff members including Dr. Charm Barges and respiratory therapy.

## 2021-01-24 NOTE — Procedures (Signed)
Central Venous Catheter Insertion Procedure Note Melvin Burns [id number] [birth year]  Procedure: Insertion of Central Venous Catheter Indications: Assessment of intravascular volume and Drug and/or fluid administration  Procedure Details Consent: Unable to obtain consent because of emergent medical necessity. Time Out: Verified patient identification, verified procedure, site/side was marked, verified correct patient position, special equipment/implants available, medications/allergies/relevent history reviewed, required imaging and test results available.  Performed  Maximum sterile technique was used including antiseptics, cap, gloves, gown, hand hygiene, mask and sheet. Skin prep: Chlorhexidine; local anesthetic administered A antimicrobial bonded/coated triple lumen catheter was placed in the left subclavian vein using the Seldinger technique.  Ultrasound was used to verify the patency of the vein and for real time needle guidance.  Evaluation Blood flow good Complications: No apparent complications Patient did tolerate procedure well. Chest X-ray ordered to verify placement.  CXR: pending.  Melvin Burns 01/29/2021, 4:44 PM

## 2021-01-24 NOTE — H&P (Signed)
NAME:  Melvin Burns, MRN:  [id number], DOB:  1966/04/26, LOS: 0 ADMISSION DATE:  02/05/2021, CONSULTATION DATE:  4/17 REFERRING MD:  Charm Barges, CHIEF COMPLAINT:  Chest pain   History of Present Illness:  55 y/o male presented to the [facility] ER with chest pain, had a cardiac arrest in the waiting room.  He was unable to provide history and the individuals who were with him do not know him very well and he has no family to provide history.  He was noted to be in V-fib and Torsades.  He received the entirety of his CPR by ER staff for 35-40 minutes, including multiple defibrillating shocks.  He would interact and reach for the tube at the beginning of his resuscitation, but towards the end he became comatose.  PCCM consulted for admission.  By the time of my arrival he was requiring high doses of levophed and dopamine.   He was recently diagnosed with hemolytic anemia related to his mechanical heart valve and was treated at [facility] for several days.  His INR was supratherapeutic on that admission, was briefly reversed    Pertinent  Medical History  Rheumatic heart disease s/p Minimally invasive bioprosthetic mitral valve on 02/2020 by Dr. [name] at [facility] COVID 19 2022 Chronic diastolic heart failure> 11/2020 LVEF 60-6%, RV systolic function normal, LE dilated, mitral valve replaced without stenosis or regurgitation, aortic valve normal  Paroxysmal atrial fibrillation Hypertension Hemolytic anemia? Has been seen by hematology in 12/2020, admitted to [facility] with anemia requiring transfusion in 01/2021, noted to have elevated LDH Chronic leukocytosis?    Significant Hospital Events: Including procedures, antibiotic start and stop dates in addition to other pertinent events   . 4/17 presented to the ER, cardiac arrest . 4/17 ETT >  . 4/17 R fem arterial line >  . 4/17 L subclavian line >  . 4/17 CT head > NAICP . 4/17 cervical spine > no acute abnormality, patchy infiltrate RUL  Interim History /  Subjective:  As above  Objective   Blood pressure 105/88, pulse (!) 54, temperature 97.7 F (36.5 C), resp. rate (!) 25, SpO2 100 %.    Vent Mode: PRVC FiO2 (%):  [100 %] 100 % Set Rate:  [20 bmp] 20 bmp Vt Set:  [650 mL] 650 mL PEEP:  [8 cmH20] 8 cmH20 Plateau Pressure:  [21 cmH20] 21 cmH20  No intake or output data in the 24 hours ending 01/22/2021 1646 There were no vitals filed for this visit.  Examination:  General:  In bed on vent HENT: NCAT ETT in place PULM: CTA B, vent supported breathing CV: RRR, no mgr GI: BS+, soft, nontender MSK: normal bulk and tone Neuro: cough, no localizing to pain on exam but flinched in left shoulder when I put in his left subclavian; some spontaneous movement but non-purposeful   Labs/imaging that I havepersonally reviewed  (right click and "Reselect all SmartList Selections" daily)  ABG with severe metabolic acidosis K 3.0 Cr 1.1 AST slightly elevated Hgb 10.0 gm/dL 12 lead EKG>  ST elevation in inferior limb and lateral precordial leads with reciprocal changes in lateral limb leads HS trop 110 Lactic acid 8.8 CXR > interstitial edema, ett in place, subclavian in place, OG in place  Resolved Hospital Problem list     Assessment & Plan:  Cardiac arrest in setting of chest pain, ACS/STEMI V-fib/torsades arrest, hypokalemia related? Seems more likely related to ischemia Cardiogenic shock Bioprosthetic mitral valve Paroxysmal atrial fibrillation History of hypertension  Admit to ICU Discussed with STEMI team> hold left heart cath given comatose state cvp monitoring Check coox Load with plavix 300mg  per tube now ASA 325 per tube Levophed for MAP > 65 Vasopressin Dopamine for MAP > 65 Wean off dopamine first Amiodarone infusion Heparin per pharmacy Hold home antihypertensives  Acute respiratory failure with hypoxemia due to cardiac arrest and inability to protect airway Full mechanical vent support VAP prevention Daily  WUA/SBT  Acute encephalopathy post cardiac arrest, due to arrest, high risk for anoxic brain injury CT head normal Admit to ICU Close monitoring of neuro status 36 degree protocol (normothermia TTM protocol currently not live) EEG and serial exams for neuro prognostication Fentanyl/versed per 36 degree protocol  Hypokalemia Replace Monitor BMET and UOP Replace electrolytes as needed  Aspiration pneumonia > in setting of cardiac arrest Check respiratory culture Ceftriaxone per pharm He does not have septic shock  Coagulopathy due to warfarin Hold warfarin Heparin per pharmacy for cardiac arrest/ACS  Metabolic encephalopathy due to septic shock Start bicarbonate infusion to help with vasopressors Hopefully off bicarb drip by tomorrow morning  Shock liver Repeat LFT in AM  History of hemolytic anemia and unexplained leukocytosis Monitor cbc    Best practice (right click and "Reselect all SmartList Selections" daily)  Diet:  NPO Pain/Anxiety/Delirium protocol (if indicated): Yes (RASS goal -1) VAP protocol (if indicated): Yes DVT prophylaxis: Systemic AC GI prophylaxis: H2B Glucose control:  SSI No Central venous access:  Yes, and it is still needed Arterial line:  Yes, and it is still needed Foley:  Yes, and it is still needed Mobility:  bed rest  PT consulted: N/A Last date of multidisciplinary goals of care discussion [hasn't happened, no family or next of kin available] Code Status:  full code Disposition: admit to ICU  Labs   CBC: Recent Labs  Lab 13-Feb-2021 1445  WBC 20.1*  NEUTROABS 14.0*  HGB 10.0*  10.9*  HCT 35.3*  32.0*  MCV 86.5  PLT 281    Basic Metabolic Panel: Recent Labs  Lab 02-13-2021 1445  NA 140  141  K 3.0*  3.1*  CL 109  106  CO2 18*  GLUCOSE 150*  161*  BUN 10  9  CREATININE 1.10  1.10  CALCIUM 7.9*  MG 1.7   GFR: Estimated Creatinine Clearance: 75 mL/min (by C-G formula based on SCr of 1.1 mg/dL). Recent Labs   Lab 2021/02/13 1445  WBC 20.1*  LATICACIDVEN 8.8*    Liver Function Tests: Recent Labs  Lab 2021/02/13 1445  AST 52*  ALT 41  ALKPHOS 87  BILITOT 1.1  PROT 6.4*  ALBUMIN 2.9*   No results for input(s): LIPASE, AMYLASE in the last 168 hours. No results for input(s): AMMONIA in the last 168 hours.  ABG    Component Value Date/Time   PHART 7.329 (L) 03/06/2020 1537   PCO2ART 46.4 03/06/2020 1537   PO2ART 108 03/06/2020 1537   HCO3 24.7 03/06/2020 1537   TCO2 23 02/13/2021 1445   ACIDBASEDEF 2.0 03/06/2020 1537   O2SAT 98.0 03/06/2020 1537     Coagulation Profile: Recent Labs  Lab Feb 13, 2021 1445  INR 3.5*    Cardiac Enzymes: No results for input(s): CKTOTAL, CKMB, CKMBINDEX, TROPONINI in the last 168 hours.  HbA1C: Hgb A1c MFr Bld  Date/Time Value Ref Range Status  03/02/2020 12:15 PM 5.2 4.8 - 5.6 % Final    Comment:    (NOTE) Pre diabetes:  5.7%-6.4% Diabetes:              >6.4% Glycemic control for   <7.0% adults with diabetes   06/01/2016 05:56 PM 4.5 <5.7 % Final    Comment:      For the purpose of screening for the presence of diabetes:   <5.7%       Consistent with the absence of diabetes 5.7-6.4 %   Consistent with increased risk for diabetes (prediabetes) >=6.5 %     Consistent with diabetes   This assay result is consistent with a decreased risk of diabetes.   Currently, no consensus exists regarding use of hemoglobin A1c for diagnosis of diabetes in children.   According to American Diabetes Association (ADA) guidelines, hemoglobin A1c <7.0% represents optimal control in non-pregnant diabetic patients. Different metrics may apply to specific patient populations. Standards of Medical Care in Diabetes (ADA).       CBG: No results for input(s): GLUCAP in the last 168 hours.  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   Surgical History:   Past Surgical History:  Procedure Laterality Date  . BILATERAL KNEE ARTHROSCOPY    . BUBBLE STUDY  12/16/2019   Procedure: BUBBLE STUDY;  Surgeon: [name], MD;  Location: [facility] ENDOSCOPY;  Service: Cardiology;;  . MITRAL VALVE REPLACEMENT Right 03/06/2020   Procedure: MINIMALLY INVASIVE MITRAL VALVE (MV) REPLACEMENT using Carbomedics Optiform 33 MM Mitral Bioprosthetic Valve.;  Surgeon: Purcell Nails, MD;  Location: [facility] OR;  Service: Open Heart Surgery;  Laterality: Right;  . MULTIPLE EXTRACTIONS WITH ALVEOLOPLASTY N/A 12/31/2019   Procedure: Extraction of tooth #'s 16-17, 20-23, and 28-30 with alveoloplasty, Bilateral mandibular tori reductions, and lateral exostoses reductions.;  Surgeon: Charlynne Pander, DDS;  Location: [facility] OR;  Service: Oral Surgery;  Laterality: N/A;  . RIGHT/LEFT HEART CATH AND CORONARY ANGIOGRAPHY N/A 12/16/2019   Procedure: RIGHT/LEFT HEART CATH AND CORONARY ANGIOGRAPHY;  Surgeon: Yvonne Kendall, MD;  Location: [facility] INVASIVE CV LAB;  Service: Cardiovascular;  Laterality: N/A;  . TEE WITHOUT CARDIOVERSION N/A 12/16/2019   Procedure: TRANSESOPHAGEAL ECHOCARDIOGRAM (TEE);  Surgeon: [name], MD;  Location: [facility] ENDOSCOPY;  Service: Cardiology;  Laterality: N/A;  . TEE WITHOUT CARDIOVERSION N/A 03/06/2020   Procedure: TRANSESOPHAGEAL ECHOCARDIOGRAM (TEE);  Surgeon: Purcell Nails, MD;  Location: [facility] OR;  Service: Open Heart Surgery;  Laterality: N/A;     Social History:   reports that he quit smoking about 13 months ago. His smoking use included cigarettes. He has a 35.00 pack-year smoking history. He has never used smokeless tobacco. He reports that he does not  drink alcohol and does not use drugs.   Family History:  His family history includes Hypertension in his father and mother; Stroke in his father.   Allergies No Known Allergies   Home Medications  Prior to Admission medications   Medication Sig Start Date End Date Taking? Authorizing Provider  aspirin EC 81 MG tablet Take 1 tablet (81 mg total) by mouth daily. 12/16/19   End, Cristal Deer, MD  ferrous sulfate 325 (65 FE) MG EC tablet Take 1 tablet (325 mg total) by mouth 2 (two) times daily. 01/08/21 07/07/21  Almon Hercules,  MD  furosemide (LASIX) 40 MG tablet Take 1 tablet (40 mg total) by mouth 2 (two) times daily. 01/13/21 07/12/24  Chilton Siandolph, Tiffany, MD  metoprolol tartrate (LOPRESSOR) 50 MG tablet TAKE 1 AND 1/2 TABLETS TWICE A DAY 01/13/21   Chilton Siandolph, Tiffany, MD  senna-docusate (SENOKOT-S) 8.6-50 MG tablet Take 1 tablet by mouth 2 (two) times daily between meals as needed for mild constipation or moderate constipation. 01/08/21   Almon HerculesGonfa, Taye T, MD  warfarin (COUMADIN) 5 MG tablet Take 1-1.5 tablets (5-7.5 mg total) by mouth See admin instructions. Take 1 tablet by mouth on Tues, Thurs, and Sat. All other days take 1 & 1/2 tablet. 01/13/21   Chilton Siandolph, Tiffany, MD     Critical care time: 60 minutes    Heber CarolinaBrent Mayrin Schmuck, MD [place] PCCM Pager: [phone] Cell: [phone] If no response, please call [phone] until 7pm After 7:00 pm call Elink  [phone]

## 2021-01-24 NOTE — Progress Notes (Signed)
  Echocardiogram 2D Echocardiogram has been performed.  Gerda Diss 01/28/2021, 10:04 PM

## 2021-01-24 NOTE — Progress Notes (Signed)
eLink Physician-Brief Progress Note Patient Name: Melvin Burns. [name] DOB: 1966-05-01 MRN: [id number]   Date of Service  18-Feb-2021  HPI/Events of Note  Lactic acid  11 secondary to cardiogenic shock and MOSF. Patient's prognosis for survival is grave.   eICU Interventions  Continue current Rx.        [name] 2021-02-18, 10:41 PM

## 2021-01-24 NOTE — ED Notes (Signed)
Kim from  [place] to dispatch unit for transfer to [facility]

## 2021-01-25 ENCOUNTER — Inpatient Hospital Stay (HOSPITAL_COMMUNITY): Payer: 59

## 2021-01-25 DIAGNOSIS — Z515 Encounter for palliative care: Secondary | ICD-10-CM

## 2021-01-25 DIAGNOSIS — Z7189 Other specified counseling: Secondary | ICD-10-CM

## 2021-01-25 DIAGNOSIS — I4901 Ventricular fibrillation: Secondary | ICD-10-CM | POA: Diagnosis not present

## 2021-01-25 DIAGNOSIS — J9601 Acute respiratory failure with hypoxia: Secondary | ICD-10-CM | POA: Diagnosis not present

## 2021-01-25 LAB — COMPREHENSIVE METABOLIC PANEL
ALT: 578 U/L — ABNORMAL HIGH (ref 0–44)
AST: 1141 U/L — ABNORMAL HIGH (ref 15–41)
Albumin: 2 g/dL — ABNORMAL LOW (ref 3.5–5.0)
Alkaline Phosphatase: 111 U/L (ref 38–126)
Anion gap: 28 — ABNORMAL HIGH (ref 5–15)
BUN: 18 mg/dL (ref 6–20)
CO2: 17 mmol/L — ABNORMAL LOW (ref 22–32)
Calcium: 7.7 mg/dL — ABNORMAL LOW (ref 8.9–10.3)
Chloride: 96 mmol/L — ABNORMAL LOW (ref 98–111)
Creatinine, Ser: 3.01 mg/dL — ABNORMAL HIGH (ref 0.61–1.24)
GFR, Estimated: 24 mL/min — ABNORMAL LOW (ref >60)
Glucose, Bld: 297 mg/dL — ABNORMAL HIGH (ref 70–99)
Potassium: 2.9 mmol/L — ABNORMAL LOW (ref 3.5–5.1)
Sodium: 141 mmol/L (ref 135–145)
Total Bilirubin: 2.8 mg/dL — ABNORMAL HIGH (ref 0.3–1.2)
Total Protein: 5.1 g/dL — ABNORMAL LOW (ref 6.5–8.1)

## 2021-01-25 LAB — COOXEMETRY PANEL
Carboxyhemoglobin: 1.8 % — ABNORMAL HIGH (ref 0.5–1.5)
Methemoglobin: 1.1 % (ref 0.0–1.5)
O2 Saturation: 75.6 %
Total hemoglobin: 8.7 g/dL — ABNORMAL LOW (ref 12.0–16.0)

## 2021-01-25 LAB — BASIC METABOLIC PANEL
Anion gap: 33 — ABNORMAL HIGH (ref 5–15)
BUN: 18 mg/dL (ref 6–20)
CO2: 9 mmol/L — ABNORMAL LOW (ref 22–32)
Calcium: 7.6 mg/dL — ABNORMAL LOW (ref 8.9–10.3)
Chloride: 97 mmol/L — ABNORMAL LOW (ref 98–111)
Creatinine, Ser: 3.4 mg/dL — ABNORMAL HIGH (ref 0.61–1.24)
GFR, Estimated: 20 mL/min — ABNORMAL LOW (ref >60)
Glucose, Bld: 211 mg/dL — ABNORMAL HIGH (ref 70–99)
Potassium: 3.2 mmol/L — ABNORMAL LOW (ref 3.5–5.1)
Sodium: 139 mmol/L (ref 135–145)

## 2021-01-25 LAB — LACTIC ACID, PLASMA
Lactic Acid, Venous: >11 mmol/L (ref 0.5–1.9)
Lactic Acid, Venous: >11 mmol/L (ref 0.5–1.9)
Lactic Acid, Venous: >11 mmol/L (ref 0.5–1.9)
Lactic Acid, Venous: >11 mmol/L (ref 0.5–1.9)

## 2021-01-25 LAB — CBC WITH DIFFERENTIAL/PLATELET
Abs Immature Granulocytes: 1.98 10*3/uL — ABNORMAL HIGH (ref 0.00–0.07)
Basophils Absolute: 0.1 10*3/uL (ref 0.0–0.1)
Basophils Relative: 0 %
Eosinophils Absolute: 0 10*3/uL (ref 0.0–0.5)
Eosinophils Relative: 0 %
HCT: 30.9 % — ABNORMAL LOW (ref 39.0–52.0)
Hemoglobin: 8.7 g/dL — ABNORMAL LOW (ref 13.0–17.0)
Immature Granulocytes: 4 %
Lymphocytes Relative: 3 %
Lymphs Abs: 1.2 10*3/uL (ref 0.7–4.0)
MCH: 24.9 pg — ABNORMAL LOW (ref 26.0–34.0)
MCHC: 28.2 g/dL — ABNORMAL LOW (ref 30.0–36.0)
MCV: 88.3 fL (ref 80.0–100.0)
Monocytes Absolute: 2.6 10*3/uL — ABNORMAL HIGH (ref 0.1–1.0)
Monocytes Relative: 6 %
Neutro Abs: 40 10*3/uL — ABNORMAL HIGH (ref 1.7–7.7)
Neutrophils Relative %: 87 %
Platelets: 242 10*3/uL (ref 150–400)
RBC: 3.5 MIL/uL — ABNORMAL LOW (ref 4.22–5.81)
RDW: 23.8 % — ABNORMAL HIGH (ref 11.5–15.5)
WBC: 45.9 10*3/uL — ABNORMAL HIGH (ref 4.0–10.5)
nRBC: 0.7 % — ABNORMAL HIGH (ref 0.0–0.2)

## 2021-01-25 LAB — PATHOLOGIST SMEAR REVIEW

## 2021-01-25 LAB — PROTIME-INR
INR: >10 (ref 0.8–1.2)
Prothrombin Time: >90 seconds — ABNORMAL HIGH (ref 11.4–15.2)

## 2021-01-25 LAB — PHOSPHORUS: Phosphorus: 7.1 mg/dL — ABNORMAL HIGH (ref 2.5–4.6)

## 2021-01-25 LAB — POCT I-STAT 7, (LYTES, BLD GAS, ICA,H+H)
Acid-base deficit: 13 mmol/L — ABNORMAL HIGH (ref 0.0–2.0)
Bicarbonate: 14.8 mmol/L — ABNORMAL LOW (ref 20.0–28.0)
Calcium, Ion: 0.96 mmol/L — ABNORMAL LOW (ref 1.15–1.40)
HCT: 31 % — ABNORMAL LOW (ref 39.0–52.0)
Hemoglobin: 10.5 g/dL — ABNORMAL LOW (ref 13.0–17.0)
O2 Saturation: 99 %
Patient temperature: 35.2
Potassium: 2.9 mmol/L — ABNORMAL LOW (ref 3.5–5.1)
Sodium: 136 mmol/L (ref 135–145)
TCO2: 16 mmol/L — ABNORMAL LOW (ref 22–32)
pCO2 arterial: 36.3 mmHg (ref 32.0–48.0)
pH, Arterial: 7.207 — ABNORMAL LOW (ref 7.350–7.450)
pO2, Arterial: 181 mmHg — ABNORMAL HIGH (ref 83.0–108.0)

## 2021-01-25 LAB — MAGNESIUM: Magnesium: 3.7 mg/dL — ABNORMAL HIGH (ref 1.7–2.4)

## 2021-01-25 LAB — GLUCOSE, CAPILLARY
Glucose-Capillary: 269 mg/dL — ABNORMAL HIGH (ref 70–99)
Glucose-Capillary: 230 mg/dL — ABNORMAL HIGH (ref 70–99)

## 2021-01-25 MED ORDER — SODIUM CHLORIDE 0.9 % IV SOLN
2.0000 g | INTRAVENOUS | Status: DC
  Administered 2021-01-25: 2 g via INTRAVENOUS
  Filled 2021-01-25: qty 2

## 2021-01-25 MED ORDER — POTASSIUM CHLORIDE 10 MEQ/50ML IV SOLN
10.0000 meq | INTRAVENOUS | Status: AC
  Administered 2021-01-25: 10 meq via INTRAVENOUS

## 2021-01-25 MED ORDER — POTASSIUM CHLORIDE 10 MEQ/50ML IV SOLN
10.0000 meq | INTRAVENOUS | Status: AC
  Administered 2021-01-25 (×2): 10 meq via INTRAVENOUS
  Filled 2021-01-25: qty 50

## 2021-01-25 MED ORDER — EPINEPHRINE PF 1 MG/ML IJ SOLN: 1.0000 mg | Freq: Once | INTRAMUSCULAR | Status: DC

## 2021-01-25 MED ORDER — NOREPINEPHRINE 16 MG/250ML-% IV SOLN
0.0000 ug/min | INTRAVENOUS | Status: DC
  Administered 2021-01-25: 50 ug/min via INTRAVENOUS
  Filled 2021-01-25: qty 250

## 2021-01-25 MED ORDER — VITAMIN K1 10 MG/ML IJ SOLN
10.0000 mg | Freq: Once | INTRAVENOUS | Status: AC
  Administered 2021-01-25: 10 mg via INTRAVENOUS
  Filled 2021-01-25: qty 1

## 2021-01-25 MED ORDER — POTASSIUM CHLORIDE 20 MEQ PO PACK
40.0000 meq | PACK | Freq: Once | ORAL | Status: AC
  Administered 2021-01-25: 40 meq
  Filled 2021-01-25: qty 2

## 2021-01-25 MED ORDER — INSULIN ASPART 100 UNIT/ML ~~LOC~~ SOLN
0.0000 [IU] | SUBCUTANEOUS | Status: DC
  Administered 2021-01-25: 5 [IU] via SUBCUTANEOUS

## 2021-01-25 MED ORDER — NOREPINEPHRINE 4 MG/250ML-% IV SOLN: 0.0000 ug/min | INTRAVENOUS | Status: DC

## 2021-01-25 MED ORDER — VITAMIN K1 10 MG/ML IJ SOLN
10.0000 mg | INTRAMUSCULAR | Status: DC
  Filled 2021-01-25: qty 1

## 2021-01-25 MED ORDER — POTASSIUM CHLORIDE 10 MEQ/50ML IV SOLN
10.0000 meq | INTRAVENOUS | Status: DC
  Administered 2021-01-25: 10 meq via INTRAVENOUS
  Filled 2021-01-25 (×4): qty 50

## 2021-01-26 LAB — CALCIUM, IONIZED: Calcium, Ionized, Serum: 4 mg/dL — ABNORMAL LOW (ref 4.5–5.6)

## 2021-01-26 LAB — HEMOGLOBIN A1C
Hgb A1c MFr Bld: 4.9 % (ref 4.8–5.6)
Mean Plasma Glucose: 94 mg/dL

## 2021-01-27 LAB — CBG MONITORING, ED: Glucose-Capillary: 170 mg/dL — ABNORMAL HIGH (ref 70–99)

## 2021-02-01 ENCOUNTER — Ambulatory Visit: Payer: 59 | Admitting: Cardiovascular Disease

## 2021-02-07 NOTE — Procedures (Signed)
Cardiopulmonary Resuscitation Note  Melvin Burns  [id number]  06/22/1966  Date:Feb 08, 2021  Time:12:03 PM   Provider Performing:Harley Mccartney Erby Pian   Procedure: Cardiopulmonary Resuscitation [phone])  Indication(s) Loss of Pulse  Consent N/A  Anesthesia N/A   Time Out N/A   Sterile Technique Hand hygiene, gloves   Procedure Description Called to patient's room for CODE BLUE. Initial rhythm was PEA/Asystole. Patient received high quality chest compressions for 2 minutes with defibrillation or cardioversion when appropriate. Epinephrine was administered x 1. Return of spontaneous circulation was not achieved.  Family to be notified.   Complications/Tolerance N/A   EBL N/A   Specimen(s) N/A  Estimated time to ROSC: N/A

## 2021-02-07 NOTE — Consult Note (Signed)
Palliative Medicine Inpatient Consult Note  Reason for consult:  "prolonged OOH arrest, help with family discussions"  HPI:  Per intake H&P --> Melvin Burns is a 55 y.o. 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 fib, prior COVID-19 infection.   He presented to the [facility] ER with chest pain, had a cardiac arrest in the waiting room.  He was unable to provide history and the individuals who were with him do not know him very well and he has no family to provide history.  He was noted to be in V-fib and Torsades.  He received the entirety of his CPR by ER staff for 35-40 minutes, including multiple defibrillating shocks.  He would interact and reach for the tube at the beginning of his resuscitation, but towards the end he became comatose.  PCCM consulted for admission.  By the time of my arrival he was requiring high doses of levophed and dopamine.   Palliative care was asked to get involved to aid in additional conversations with patient's family after prolonged cardiac arrest.  Clinical Assessment/Goals of Care:  *Please note that this is a verbal dictation therefore any spelling or grammatical errors are due to the "Dragon Medical One" system interpretation.  I have reviewed medical records including EPIC notes, labs and imaging, assessed the patient who is acutely ill in appearance intubated and on multiple modalities of support.  His heart rate was noted to be in the 40s and per his bedside RN, Melvin Burns he had been doing   I called patient relative Melvin Burns to further discuss diagnosis prognosis, GOC, EOL wishes, disposition and options.   I introduced Palliative Medicine as specialized medical care for people living with serious illness. It focuses on providing relief from the symptoms and stress of a serious illness. The goal is to improve quality of life for  both the patient and the family.  I spoke to Melvin over the phone. He shares with me that he os Melvin Burns's best friend and former roommate. They have known one another for > 20 years. Melvin Burns has never been married and does not have children. He is from [place] originally. He worked as an Scientist, product/process development. He is of Muslim faith.  Prior to admission, Melvin Burns was fully independent.   A detailed discussion was had today regarding advanced directives there are none on file. Melvin Burns's closest friend is Melvin Burns. His brother, Melvin Burns is in [place] and his cousin, Melvin Burns is in the [place].    Concepts specific to code status, artifical feeding and hydration, continued IV antibiotics and rehospitalization was had.  We reviewed that [facility] had suffered a cardiac arrest and entered cardiopulmonary resuscitation.  We discussed that he is on maximum amounts of support and that the outlook and prognosis is very poor. Melvin dates understanding and expresses that he had just spoken to Melvin Burns who had shared that the patient's "heart is no longer working".  For due diligence purposes Melvin Burns would like [place] to make every effort to keep patient alive to allow time for his brother Melvin Burns to get here.  Reviewed that Melvin Burns is extremely critically ill and that his likelihood of survival to midweek this poor.  Despite this shared that I will endorse to the primary medical team that patient remain full code at least to try enable patient's brother to get here.  Discussed the importance of continued conversation with family  and their  medical providers regarding overall plan of care and treatment options, ensuring decisions are within the context of the patients values and GOCs.  Decision Maker: Melvin Burns (best-friend) [phone]  SUMMARY OF RECOMMENDATIONS   Full Code/ Full scope of treatment  Patients brother, Melvin Burns coming from [place] prior to additional decisions. Patients  best friend is aware that patient may not survive until mid-week.  Ongoing PMT support  Code Status/Advance Care Planning: FULL CODE    Palliative Prophylaxis:   Turn Q2H, Nonverbal indicators of pain  Additional Recommendations (Limitations, Scope, Preferences):  Continue full scope of care  Psycho-social/Spiritual:   Desire for further Chaplaincy support: No  Additional Recommendations: Education on post-cardiac arrest and long term defeceits   Prognosis: Very dismal prognosis  Discharge Planning: Likely celestial  Vitals:   2021-02-19 0945 2021/02/19 1000  BP: (!) 83/68 (!) 72/28  Pulse:    Resp: (!) 30 (!) 30  Temp:  (!) 92.5 F (33.6 C)  SpO2:      Intake/Output Summary (Last 24 hours) at 02-19-21 1051 Last data filed at Feb 19, 2021 1000 Gross per 24 hour  Intake 4420.68 ml  Output 576 ml  Net 3844.68 ml   Last Weight  Most recent update: 02/19/21  4:09 AM   Weight  78.4 kg (172 lb 13.5 oz)           Gen:  Very ill appearing AA M HEENT: ETT, dry mucous membranes CV: Irregular rate and rhythm  PULM: On ventilator support ABD: soft/nontender  EXT: No edema  Neuro: Somnolent on versed  PPS: 10%   This conversation/these recommendations were discussed with patient primary care team, Melvin Burns  Time In: 1050 Time Out: 1200 Total Time: 70 Greater than 50%  of this time was spent counseling and coordinating care related to the above assessment and plan.  Melvin Burns Palliative Medicine Team Team Cell Phone: [phone] Please utilize secure chat with additional questions, if there is no response within 30 minutes please call the above phone number  Palliative Medicine Team providers are available by phone from 7am to 7pm daily and can be reached through the team cell phone.  Should this patient require assistance outside of these hours, please call the patient's attending physician.

## 2021-02-07 NOTE — Plan of Care (Signed)

## 2021-02-07 NOTE — Progress Notes (Signed)
eLink Physician-Brief Progress Note Patient Name: Melvin Burns. Mcnairy DOB: [birth year] MRN: [id number]   Date of Service  02/11/21  HPI/Events of Note  Patient has an INR of 5.4, no evidence of overt bleeding, he has a history of recent bioprosthetic valve repair, paroxysmal atrial fibrillation and supected acute coronary syndrome.  eICU Interventions  Will monitor INR  Closely without intervention for now.        Melvin Burns Feb 11, 2021, 2:15 AM

## 2021-02-07 NOTE — Progress Notes (Signed)
   Palliative Medicine Inpatient Follow Up Note  Have called patients friend, Evelena Peat (best-friend) [phone] and left a generic VM.  Awaiting call back to inform Madar that [facility] has passed away.  I was able to speak to Madar this late afternoon. The news has been provided that [facility] is no longer with [place]. He understood and endorsed that he would alert family and friends.   No Charge. ______________________________________________________________________________________ Lamarr Lulas [place] Palliative Medicine Team Team Cell Phone: [phone] Please utilize secure chat with additional questions, if there is no response within 30 minutes please call the above phone number  Palliative Medicine Team providers are available by phone from 7am to 7pm daily and can be reached through the team cell phone.  Should this patient require assistance outside of these hours, please call the patient's attending physician.

## 2021-02-07 NOTE — Progress Notes (Signed)
    Progress Note from the Palliative Medicine Team at [facility]   Patient Name: Melvin Burns       Date: February 05, 2021 DOB: 09-17-1966  Age: 55 y.o. MRN#: [id number] Attending Physician: Lorin Glass, MD Primary Care Physician: Chilton Si, MD Admit Date: 01/22/2021  This nurse practitioner was requested by nursing, to the bedside,  to talk with a person, claiming to be the patient's brother here to pick up car keys belonging to  [name]. Patient  expired today after a CODE BLUE was called , PEA/asystole,  resuscitation attempted.  Time of death 12:03  I spoke to the visitor who tells me his name is Melvin Burns.  After a brief conversation he tells me that he is "friend" of the deceased.  I let him know that [name] died  today after an resuscitative attempt.  He did not know this and was grateful for the information.  I shared with him that we could not give the patient's belongings to him at this time.  Further clarification of patient's legal  family will need to be determined.  Nursing updated  No charge  Lorinda Creed NP  Palliative Medicine Team Team Phone # [phone] Pager [phone]

## 2021-02-07 NOTE — Progress Notes (Signed)
eLink Physician-Brief Progress Note Patient Name: Melvin Burns. Melvin Burns DOB: Jan 16, 1966 MRN: [id number]   Date of Service  01-28-2021  HPI/Events of Note  INR > 10  eICU Interventions  Plavix discontinued, Vitamin K 10 mg iv x 1        Yan Pankratz [name] January 28, 2021, 6:09 AM

## 2021-02-07 NOTE — Progress Notes (Signed)
Cardiologist Dr. Mackie Pai came to bedside to assess pt.  EKG performed and reviewed by Dr. Mackie Pai.

## 2021-02-07 NOTE — Progress Notes (Addendum)
eLink Physician-Brief Progress Note Patient Name: Melvin Burns. Birkland DOB: May 10, 1966 MRN: [id number]   Date of Service  02-21-21  HPI/Events of Note  Patient needs orders transitioning from peripheral Norepinephrine gtt to full dose range Norepinephrine gtt (patient has a central line). K+ 2.9.  eICU Interventions  Orders entered. K+ replaced utilizing modified E-Link adult electrolyte replacement protocol.        Thomasene Lot Aundra Pung 21-Feb-2021, 5:32 AM

## 2021-02-07 NOTE — Progress Notes (Addendum)
NAME:  Melvin Burns, MRN:  [id number], DOB:  Jul 02, 1966, LOS: 1 ADMISSION DATE:  01/10/2021, CONSULTATION DATE:  4/17 REFERRING MD:  Charm Barges, CHIEF COMPLAINT:  Chest pain   History of Present Illness:  55 y/o male presented to the [facility] ER with chest pain, had a cardiac arrest in the waiting room.  He was unable to provide history and the individuals who were with him do not know him very well and he has no family to provide history.  He was noted to be in V-fib and Torsades.  He received the entirety of his CPR by ER staff for 35-40 minutes, including multiple defibrillating shocks.  He would interact and reach for the tube at the beginning of his resuscitation, but towards the end he became comatose.  PCCM consulted for admission.  By the time of my arrival he was requiring high doses of levophed and dopamine.   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  His INR was supratherapeutic on that admission, was briefly reversed    Pertinent  Medical History  Rheumatic heart disease s/p Minimally invasive bioprosthetic mitral valve on 02/2020 by Dr. [name] at [facility] COVID 19 2022 Chronic diastolic heart failure> 11/2020 LVEF 60-6%, RV systolic function normal, LE dilated, mitral valve replaced without stenosis or regurgitation, aortic valve normal  Paroxysmal atrial fibrillation Hypertension Hemolytic anemia? Has been seen by hematology in 12/2020, admitted to [facility] with anemia requiring transfusion in 01/2021, noted to have elevated LDH Chronic leukocytosis?    Significant Hospital Events: Including procedures, antibiotic start and stop dates in addition to other pertinent events   . 4/17 presented to the ER, cardiac arrest . 4/17 ETT >  . 4/17 R fem arterial line >  . 4/17 L subclavian line >  . 4/17 CT head > NAICP . 4/17 cervical spine > no acute abnormality, patchy infiltrate RUL  Interim History /  Subjective:  In multiorgan failure On cooling protocol  Objective   Blood pressure 99/70, pulse 86, temperature (!) 95.9 F (35.5 C), resp. rate (!) 30, height 5\' 11"  (1.803 m), weight 78.4 kg, SpO2 100 %. CVP:  [12 mmHg-30 mmHg] 14 mmHg  Vent Mode: PRVC FiO2 (%):  [70 %-100 %] 70 % Set Rate:  [20 bmp-30 bmp] 30 bmp Vt Set:  [600 mL-650 mL] 600 mL PEEP:  [8 cmH20-15 cmH20] 10 cmH20 Plateau Pressure:  [21 cmH20-26 cmH20] 26 cmH20   Intake/Output Summary (Last 24 hours) at 01-27-21 0834 Last data filed at 01-27-21 0700 Gross per 24 hour  Intake 3606.14 ml  Output 576 ml  Net 3030.14 ml   Filed Weights   01/10/2021 2000  Weight: 78.4 kg    Examination: Constitutional: unresponsive man on vent  Eyes: pupils fixed/dilated Ears, nose, mouth, and throat: ETT in place, small thick secretions Cardiovascular: irregular, ext cool Respiratory: passive on vent, no wheezing Gastrointestinal: Soft, hypoactive BS Skin: No rashes, normal turgor Neurologic:  Pupils fixed and dilated No cough/gag No corneals or oculocephalic GCS3 Was still on a bit of fent/versed  Psychiatric: cannot assess   Labs/imaging that I havepersonally reviewed  (right click and "Reselect all SmartList Selections" daily)  Not clearing lactate INR undetectably high WBC high Acute renal/liver failure  Resolved Hospital Problem list     Assessment & Plan:  Cardiac arrest in setting of chest pain, ACS/STEMI V-fib/torsades arrest, hypokalemia related? Seems more likely related to ischemia  Septic shock Aspiration pneumonitis Bioprosthetic mitral valve Paroxysmal atrial fibrillation History of hypertension Acute respiratory failure with hypoxemia due to cardiac arrest and inability to protect airway Acute liver failure Acute renal failure On warfarin PTA- given vitamin K for high INR  - Hold AC, watch INR - Ceftriaxone - Pressors as ordered - Hold sedation, suspicion he has progressed to brain  death - I have called relative's number in chart, no answer - Continue vent support and VAP prevention bundle, increased RR to help with metabolic acidemia - Continue bicarb gtt - TOC to help find family - Palliative care consult  Best practice (right click and "Reselect all SmartList Selections" daily)  Diet:  NPO Pain/Anxiety/Delirium protocol (if indicated): Yes (RASS goal -1) VAP protocol (if indicated): Yes DVT prophylaxis: Systemic AC GI prophylaxis: H2B Glucose control:  SSI No Central venous access:  Yes, and it is still needed Arterial line:  Yes, and it is still needed Foley:  Yes, and it is still needed Mobility:  bed rest  PT consulted: N/A Last date of multidisciplinary goals of care discussion [hasn't happened, no family or next of kin available] Code Status:  full code Disposition: admit to ICU   Patient critically ill due to post cardiac arrest, shock Interventions to address this today vent and pressor titration Risk of deterioration without these interventions is high  I personally spent 51 minutes providing critical care not including any separately billable procedures  Myrla Halsted MD [facility]  Prefer epic messenger for cross cover needs If after hours, please call E-link

## 2021-02-07 NOTE — Death Summary Note (Signed)
DEATH SUMMARY   Patient Details  Name: Melvin Burns. Melvin Burns MRN: [id number] DOB: 09/01/1966  Admission/Discharge Information   Admit Date:  2021/02/23  Date of Death:    Time of Death:    Length of Stay: 1  Referring Physician: Chilton Si, MD   Reason(s) for Hospitalization  Prolonged  Acute respiratory failure Acute liver failure Acute renal failure Profound cardiogenic/septic shock Aspiration pneumonitis   Brief Hospital Course (including significant findings, care, treatment, and services provided and events leading to death)  Melvin Burns is a 55 y.o. year old male who presented with out of hospital cardiac arrest requiring over 30 minutes CPR.  While ROSC was obtained he went into multiorgan failure and succumbed to his illness.  Family is to be notified.   Pertinent Labs and Studies  Significant Diagnostic Studies DG Chest 2 View  Result Date: 01/03/2021 CLINICAL DATA:  Hemoptysis EXAM: CHEST - 2 VIEW COMPARISON:  12/18/2020 FINDINGS: Unchanged mild diffuse interstitial opacity. Prosthetic valve. Normal cardiomediastinal contours. No pleural effusion or pneumothorax. IMPRESSION: Unchanged mild diffuse interstitial opacity. Electronically Signed   By: Deatra Robinson M.D.   On: 01/03/2021 21:14   CT Head Wo Contrast  Result Date: 02/23/21 CLINICAL DATA:  Altered mental status following recent CPR EXAM: CT HEAD WITHOUT CONTRAST CT CERVICAL SPINE WITHOUT CONTRAST TECHNIQUE: Multidetector CT imaging of the head and cervical spine was performed following the standard protocol without intravenous contrast. Multiplanar CT image reconstructions of the cervical spine were also generated. COMPARISON:  None. FINDINGS: CT HEAD FINDINGS Brain: Slightly limited due to patient motion artifact. No evidence of acute infarction, hemorrhage, hydrocephalus, extra-axial collection or mass lesion/mass effect. Vascular: No hyperdense vessel or unexpected calcification. Skull: Normal.  Negative for fracture or focal lesion. Sinuses/Orbits: No acute finding. Other: None. CT CERVICAL SPINE FINDINGS Alignment: Mild loss of the normal cervical lordosis is noted likely related to muscular spasm or positioning Skull base and vertebrae: Motion artifact is noted. Seven cervical segments are well visualized. Vertebral body height is well maintained. No acute fracture or acute facet abnormality is seen. The odontoid is within normal limits. Soft tissues and spinal canal: Surrounding soft tissue structures appear within normal limits. \ Upper chest: Visualized lung apices demonstrate mild airspace opacity right upper lobe new from the recent chest x-ray. Other: None IMPRESSION: CT of the head: No acute intracranial abnormality noted. CT of the cervical spine: No acute abnormality noted in the cervical spine. Patchy infiltrate in the right upper lobe posteriorly. Electronically Signed   By: Alcide Clever M.D.   On: Feb 23, 2021 16:18   CT Chest W Contrast  Result Date: 01/03/2021 CLINICAL DATA:  Hemoptysis EXAM: CT CHEST WITH CONTRAST TECHNIQUE: Multidetector CT imaging of the chest was performed during intravenous contrast administration. CONTRAST:  75mL OMNIPAQUE IOHEXOL 300 MG/ML  SOLN COMPARISON:  Chest x-ray from earlier in the same day. FINDINGS: Cardiovascular: Thoracic aorta and its branches are within normal limits without aneurysmal dilatation or dissection. 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 No cardiac enlargement is noted. The pulmonary artery as visualized is within normal limits. No evidence of pulmonary emboli are noted. Mediastinum/Nodes: No sizable hilar or mediastinal adenopathy is noted. Few scattered small mediastinal nodes are noted stable from the prior study. Thoracic inlet is within normal limits. The esophagus is unremarkable. Lungs/Pleura: Lungs are well aerated bilaterally. Some very mild interstitial density is noted similar to that seen on prior plain film examination consistent  with mild edema. No focal confluent infiltrate or sizable effusion  is noted. No sizable parenchymal nodules are seen. Mild emphysematous changes are noted. Upper Abdomen: Visualized upper abdomen is within normal limits. Musculoskeletal: No acute bony abnormality is noted. IMPRESSION: Mild changes of interstitial edema. No other focal abnormality is seen. Aortic Atherosclerosis (ICD10-I70.0) and Emphysema (ICD10-J43.9). Electronically Signed   By: Alcide Clever M.D.   On: 01/03/2021 23:48   CT CERVICAL SPINE WO CONTRAST  Result Date: 01/31/2021 CLINICAL DATA:  Altered mental status following recent CPR EXAM: CT HEAD WITHOUT CONTRAST CT CERVICAL SPINE WITHOUT CONTRAST TECHNIQUE: Multidetector CT imaging of the head and cervical spine was performed following the standard protocol without intravenous contrast. Multiplanar CT image reconstructions of the cervical spine were also generated. COMPARISON:  None. FINDINGS: CT HEAD FINDINGS Brain: Slightly limited due to patient motion artifact. No evidence of acute infarction, hemorrhage, hydrocephalus, extra-axial collection or mass lesion/mass effect. Vascular: No hyperdense vessel or unexpected calcification. Skull: Normal. Negative for fracture or focal lesion. Sinuses/Orbits: No acute finding. Other: None. CT CERVICAL SPINE FINDINGS Alignment: Mild loss of the normal cervical lordosis is noted likely related to muscular spasm or positioning Skull base and vertebrae: Motion artifact is noted. Seven cervical segments are well visualized. Vertebral body height is well maintained. No acute fracture or acute facet abnormality is seen. The odontoid is within normal limits. Soft tissues and spinal canal: Surrounding soft tissue structures appear within normal limits. \ Upper chest: Visualized lung apices demonstrate mild airspace opacity right upper lobe new from the recent chest x-ray. Other: None IMPRESSION: CT of the head: No acute intracranial abnormality noted. CT of  the cervical spine: No acute abnormality noted in the cervical spine. Patchy infiltrate in the right upper lobe posteriorly. Electronically Signed   By: Alcide Clever M.D.   On: 01/17/2021 16:18   DG Chest Port 1 View  Result Date: 16-Feb-2021 CLINICAL DATA:  Respiratory dependent.  Evaluate endotracheal tube. EXAM: PORTABLE CHEST 1 VIEW COMPARISON:  01/30/2021 FINDINGS: Endotracheal tube is 1.7 cm above the carina. Nasogastric tube extends into the abdomen but the tip is beyond the image. Slightly increased lung densities are most compatible with pulmonary edema. Heart size appears to be smaller in size. Patient has had previous mitral valve surgery. Cardiac pad over overlying the chest. Left subclavian central line in the upper SVC region. Negative for a pneumothorax. IMPRESSION: 1. Increased interstitial pulmonary edema. 2. Heart size appears to be smaller than the recent comparison examination. 3. Endotracheal tube is appropriately positioned. Electronically Signed   By: Richarda Overlie M.D.   On: Feb 16, 2021 08:41   DG Chest Port 1 View  Result Date: 01/09/2021 CLINICAL DATA:  Central line placement. EXAM: PORTABLE CHEST 1 VIEW COMPARISON:  01/08/2021 FINDINGS: Endotracheal tube is in place with tip approximately 1.6 centimeters above the carina. LEFT-sided subclavian line tip overlies the level of superior vena cava. Stable cardiomegaly. Slightly prominent interstitial markings consistent with interstitial pulmonary edema. No new consolidations. No pneumothorax. IMPRESSION: Interval placement of LEFT-sided subclavian line. No pneumothorax. Electronically Signed   By: Norva Pavlov M.D.   On: 02/06/2021 17:28   DG Chest Port 1 View  Result Date: 01/12/2021 CLINICAL DATA:  Chest pain and check line placement EXAM: PORTABLE CHEST 1 VIEW COMPARISON:  01/03/2021 FINDINGS: Cardiac shadow is stable. Postsurgical changes are again noted. Endotracheal tube and gastric catheter are noted in satisfactory position.  Lungs are well aerated bilaterally. No focal infiltrate or effusion is seen. Previously seen interstitial edema has resolved in the interval. IMPRESSION: Tubes and  lines as described above. Resolved interstitial edema Electronically Signed   By: Alcide Clever M.D.   On: 2021-01-30 15:36   DG Abd Portable 1V  Result Date: 2021/01/30 CLINICAL DATA:  Check gastric catheter placement EXAM: PORTABLE ABDOMEN - 1 VIEW COMPARISON:  Film from earlier in the same day. FINDINGS: Gastric catheter is been placed and the stomach is now decompressed. No other focal abnormality is noted. IMPRESSION: Nasogastric catheter within the stomach. Electronically Signed   By: Alcide Clever M.D.   On: 30-Jan-2021 20:19   DG Abd Portable 1V  Result Date: 2021-01-30 CLINICAL DATA:  OG tube placement. EXAM: PORTABLE ABDOMEN - 1 VIEW COMPARISON:  Chest x-ray 2021/01/30 at 4:34 p.m. and at 3 o'clock p.m. FINDINGS: There is gaseous distension of the stomach. Despite history of orogastric tube placement, no tube is identified. Bowel gas pattern is nonobstructive. No evidence for free intraperitoneal air on this supine view. IMPRESSION: 1. No tube identified. 2. Gaseous distension of the stomach. Electronically Signed   By: Norva Pavlov M.D.   On: 01/30/21 17:30   ECHOCARDIOGRAM COMPLETE  Result Date: 01-30-21    ECHOCARDIOGRAM REPORT   Patient Name:   Leary Roca Date of Exam: 2021/01/30 Medical Rec #:  [id number]           Height:       71.0 in Accession #:    [id number]          Weight:       172.4 lb Date of Birth:  10/15/1965          BSA:          1.980 m Patient Age:    54 years            BP:           133/106 mmHg Patient Gender: M                   HR:           77 bpm. Exam Location:  Inpatient Procedure: 2D Echo, Cardiac Doppler, Color Doppler and 3D Echo                         STAT ECHO Reported to: Dr Weston Brass on 2021-01-30 10:00:00 PM. Indications:    Cardiac arrest  History:        Patient has prior  history of Echocardiogram examinations, most                 recent 12/04/2020. Mitral Valve Disease and Aortic Valve Disease;                 Risk Factors:Hypertension. 33 mm [employer] mechanical                 valve present in the mitral position. Procedure Date:                 03/06/2020.                  Mitral Valve: 33 mm [employer] mechanical valve valve is                 present in the mitral position. Procedure Date: 03/06/20.  Sonographer:    Ross Ludwig RDCS (AE) Referring Phys: [id number] Lupita Leash  Sonographer Comments: Technically difficult study due to poor echo windows. IMPRESSIONS  1. Left ventricular ejection fraction, by estimation, is 50 to  55%. The left ventricle has low normal function. The left ventricle demonstrates regional wall motion abnormalities (see scoring diagram/findings for description). There is mild left ventricular hypertrophy. Left ventricular diastolic function could not be evaluated.  2. Right ventricular systolic function is mildly reduced. The right ventricular size is normal. Tricuspid regurgitation signal is inadequate for assessing PA pressure.  3. Left atrial size was moderately dilated.  4. The mitral valve has been repaired/replaced. Mild mitral valve regurgitation. The mean mitral valve gradient is 8.0 mmHg with average heart rate of 109 bpm. There is a 33 mm [employer] mechanical valve present in the mitral position. Procedure  Date: 03/06/20. Valve is not optimally visualized due to tachycardia and image quality. Leaflet motion appears grossly preserved and no definite dehiscence or regurgitation.  5. The aortic valve is grossly normal. There is mild calcification of the aortic valve. Aortic valve regurgitation is mild. Mild aortic valve sclerosis is present, with no evidence of aortic valve stenosis. FINDINGS  Left Ventricle: Left ventricular ejection fraction, by estimation, is 50 to 55%. The left ventricle has low normal function. The left  ventricle demonstrates regional wall motion abnormalities. The left ventricular internal cavity size was normal in size. There is mild left ventricular hypertrophy. Left ventricular diastolic function could not be evaluated due to mitral valve replacement. Left ventricular diastolic function could not be evaluated.  LV Wall Scoring: The mid and distal anterior wall, apical lateral segment, and mid anterolateral segment are hypokinetic. Right Ventricle: The right ventricular size is normal. No increase in right ventricular wall thickness. Right ventricular systolic function is mildly reduced. Tricuspid regurgitation signal is inadequate for assessing PA pressure. Left Atrium: Left atrial size was moderately dilated. Right Atrium: Right atrial size was normal in size. Pericardium: Trivial pericardial effusion is present. Mitral Valve: The mitral valve has been repaired/replaced. Mild mitral valve regurgitation. There is a 33 mm [employer] mechanical valve present in the mitral position. Procedure Date: 03/06/20. MV peak gradient, 20.1 mmHg. The mean mitral valve gradient is 8.0 mmHg with average heart rate of 109 bpm. Tricuspid Valve: The tricuspid valve is normal in structure. Tricuspid valve regurgitation is trivial. Aortic Valve: The aortic valve is grossly normal. There is mild calcification of the aortic valve. Aortic valve regurgitation is mild. Aortic regurgitation PHT measures 264 msec. Mild aortic valve sclerosis is present, with no evidence of aortic valve stenosis. Aortic valve mean gradient measures 5.5 mmHg. Aortic valve peak gradient measures 9.8 mmHg. Aortic valve area, by VTI measures 1.88 cm. Pulmonic Valve: The pulmonic valve was not well visualized. Pulmonic valve regurgitation is trivial. Aorta: The aortic root is normal in size and structure. Venous: IVC assessment for right atrial pressure unable to be performed due to mechanical ventilation. IAS/Shunts: No atrial level shunt detected by  color flow Doppler.  LEFT VENTRICLE PLAX 2D LVIDd:         4.30 cm LVIDs:         3.20 cm LV PW:         1.60 cm LV IVS:        1.20 cm LVOT diam:     1.90 cm      3D Volume EF: LV SV:         40           3D EF:        48 % LV SV Index:   20           LV EDV:  239 ml LVOT Area:     2.84 cm     LV ESV:       125 ml                             LV SV:        115 ml  LV Volumes (MOD) LV vol d, MOD A2C: 118.0 ml LV vol d, MOD A4C: 107.0 ml LV vol s, MOD A2C: 67.6 ml LV vol s, MOD A4C: 70.9 ml LV SV MOD A2C:     50.4 ml LV SV MOD A4C:     107.0 ml LV SV MOD BP:      44.6 ml RIGHT VENTRICLE            IVC RV Basal diam:  2.50 cm    IVC diam: 2.00 cm RV S prime:     7.62 cm/s TAPSE (M-mode): 1.2 cm LEFT ATRIUM             Index       RIGHT ATRIUM           Index LA diam:        2.95 cm 1.49 cm/m  RA Area:     15.40 cm LA Vol (A2C):   59.4 ml 30.01 ml/m RA Volume:   37.20 ml  18.79 ml/m LA Vol (A4C):   87.6 ml 44.25 ml/m LA Biplane Vol: 71.9 ml 36.32 ml/m  AORTIC VALVE AV Area (Vmax):    1.65 cm AV Area (Vmean):   1.55 cm AV Area (VTI):     1.88 cm AV Vmax:           156.75 cm/s AV Vmean:          108.250 cm/s AV VTI:            0.210 m AV Peak Grad:      9.8 mmHg AV Mean Grad:      5.5 mmHg LVOT Vmax:         91.40 cm/s LVOT Vmean:        59.240 cm/s LVOT VTI:          0.140 m LVOT/AV VTI ratio: 0.66 AI PHT:            264 msec  AORTA Ao Root diam: 2.90 cm Ao Asc diam:  3.00 cm MITRAL VALVE MV Area (PHT): 3.60 cm     SHUNTS MV Area VTI:   1.01 cm     Systemic VTI:  0.14 m MV Peak grad:  20.1 mmHg    Systemic Diam: 1.90 cm MV Mean grad:  8.0 mmHg MV Vmax:       2.24 m/s MV Vmean:      138.0 cm/s MV Decel Time: 211 msec MV E velocity: 197.00 cm/s MV A velocity: 97.00 cm/s MV E/A ratio:  2.03 Weston Brass MD Electronically signed by Weston Brass MD Signature Date/Time: 01/12/2021/10:29:52 PM    Final     Microbiology Recent Results (from the past 240 hour(s))  Resp Panel by RT-PCR (Flu A&B, Covid)  Nasopharyngeal Swab     Status: None   Collection Time: 01/09/2021  3:40 PM   Specimen: Nasopharyngeal Swab; Nasopharyngeal(NP) swabs in vial transport medium  Result Value Ref Range Status   SARS Coronavirus 2 by RT PCR NEGATIVE NEGATIVE Final    Comment: (NOTE) SARS-CoV-2 target nucleic acids are NOT DETECTED.  The SARS-CoV-2 RNA is generally detectable  in upper respiratory specimens during the acute phase of infection. The lowest concentration of SARS-CoV-2 viral copies this assay can detect is 138 copies/mL. A negative result does not preclude SARS-Cov-2 infection and should not be used as the sole basis for treatment or other patient management decisions. A negative result may occur with  improper specimen collection/handling, submission of specimen other than nasopharyngeal swab, presence of viral mutation(s) within the areas targeted by this assay, and inadequate number of viral copies(<138 copies/mL). A negative result must be combined with clinical observations, patient history, and epidemiological information. The expected result is Negative.  Fact Sheet for Patients:  BloggerCourse.comhttps://www.fda.gov/media/152166/download  Fact Sheet for Healthcare Providers:  SeriousBroker.ithttps://www.fda.gov/media/152162/download  This test is no t yet approved or cleared by the [place] FDA and  has been authorized for detection and/or diagnosis of SARS-CoV-2 by FDA under an Emergency Use Authorization (EUA). This EUA will remain  in effect (meaning this test can be used) for the duration of the COVID-19 declaration under Section 564(b)(1) of the Act, 21 U.S.C.section 360bbb-3(b)(1), unless the authorization is terminated  or revoked sooner.       Influenza A by PCR NEGATIVE NEGATIVE Final   Influenza B by PCR NEGATIVE NEGATIVE Final    Comment: (NOTE) The Xpert Xpress SARS-CoV-2/FLU/RSV plus assay is intended as an aid in the diagnosis of influenza from Nasopharyngeal swab specimens and should not be  used as a sole basis for treatment. Nasal washings and aspirates are unacceptable for Xpert Xpress SARS-CoV-2/FLU/RSV testing.  Fact Sheet for Patients: BloggerCourse.comhttps://www.fda.gov/media/152166/download  Fact Sheet for Healthcare Providers: SeriousBroker.ithttps://www.fda.gov/media/152162/download  This test is not yet approved or cleared by the [place] FDA and has been authorized for detection and/or diagnosis of SARS-CoV-2 by FDA under an Emergency Use Authorization (EUA). This EUA will remain in effect (meaning this test can be used) for the duration of the COVID-19 declaration under Section 564(b)(1) of the Act, 21 U.S.C. section 360bbb-3(b)(1), unless the authorization is terminated or revoked.  Performed at [facility], [address] [place], [place] [address]   MRSA PCR Screening     Status: None   Collection Time: 01/09/2021  7:15 PM   Specimen: Nasopharyngeal  Result Value Ref Range Status   MRSA by PCR NEGATIVE NEGATIVE Final    Comment:        The GeneXpert MRSA Assay (FDA approved for NASAL specimens only), is one component of a comprehensive MRSA colonization surveillance program. It is not intended to diagnose MRSA infection nor to guide or monitor treatment for MRSA infections. Performed at [facility], [address] [place], [place] [address]     Lab Basic Metabolic Panel: Recent Labs  Lab 01/22/2021 1445 01/23/2021 1944 01/23/2021 2003 01/09/2021 2326 2021-04-02 0346 2021-04-02 0425 2021-04-02 0844  NA 140  141 138 138 137 136 141 139  K 3.0*  3.1* 3.3* 3.1* 2.6* 2.9* 2.9* 3.2*  CL 109  106 95*  --   --   --  96* 97*  CO2 18* 19*  --   --   --  17* 9*  GLUCOSE 150*  161* 424*  --   --   --  297* 211*  BUN 10  9 15   --   --   --  18 18  CREATININE 1.10  1.10 2.11*  --   --   --  3.01* 3.40*  CALCIUM 7.9* 8.0*  --   --   --  7.7* 7.6*  MG  1.7  --   --   --   --  3.7*  --   PHOS  --   --   --   --   --  7.1*  --    Liver Function  Tests: Recent Labs  Lab 02/02/21 1445 01/17/2021 0425  AST 52* 1,141*  ALT 41 578*  ALKPHOS 87 111  BILITOT 1.1 2.8*  PROT 6.4* 5.1*  ALBUMIN 2.9* 2.0*   No results for input(s): LIPASE, AMYLASE in the last 168 hours. No results for input(s): AMMONIA in the last 168 hours. CBC: Recent Labs  Lab 2021/02/02 1445 2021/02/02 2003 02-02-2021 2326 02/05/2021 0346 02/06/2021 0425  WBC 20.1*  --   --   --  45.9*  NEUTROABS 14.0*  --   --   --  40.0*  HGB 10.0*  10.9* 12.2* 11.6* 10.5* 8.7*  HCT 35.3*  32.0* 36.0* 34.0* 31.0* 30.9*  MCV 86.5  --   --   --  88.3  PLT 281  --   --   --  242   Cardiac Enzymes: No results for input(s): CKTOTAL, CKMB, CKMBINDEX, TROPONINI in the last 168 hours. Sepsis Labs: Recent Labs  Lab February 02, 2021 1445 02/02/2021 1944 02/02/21 2325 01/23/2021 0102 01/28/2021 0149 01/23/2021 0425  WBC 20.1*  --   --   --   --  45.9*  LATICACIDVEN 8.8*   < > >11.0* >11.0* >11.0* >11.0*   < > = values in this interval not displayed.    Lorin Glass 02/03/2021, 12:04 PM

## 2021-02-07 NOTE — Progress Notes (Signed)
75cc of Midazolam and 90cc of Fentanyl wasted in the Pyxis with Olivia,RN.

## 2021-02-07 NOTE — Progress Notes (Signed)
eLink Physician-Brief Progress Note Patient Name: Melvin Burns. Bornhorst DOB: Feb 08, 1966 MRN: [id number]   Date of Service  02/06/2021  HPI/Events of Note  INR 5.4  eICU Interventions  Aspirin and Heparin discontinued.        Melvin Burns 02/03/2021, 12:05 AM

## 2021-02-07 DEATH — deceased

## 2021-03-01 ENCOUNTER — Encounter: Payer: No Typology Code available for payment source | Admitting: Thoracic Surgery (Cardiothoracic Vascular Surgery)

## 2021-03-05 ENCOUNTER — Ambulatory Visit: Payer: No Typology Code available for payment source | Admitting: Cardiovascular Disease

## 2021-03-10 IMAGING — CT CT ANGIO CHEST
3 of 8 series · 13 of 36 positions shown · IV contrast (iopamidol)
Comparison: None.

CLINICAL DATA: Mitral valve regurgitation, aortic stenosis,
evaluate for dissection

EXAM:
CT ANGIOGRAPHY CHEST, ABDOMEN AND PELVIS
TECHNIQUE: Multidetector CT imaging through the chest, abdomen and pelvis was
performed using the standard protocol during bolus administration of
intravenous contrast. Multiplanar reconstructed images and MIPs were
obtained and reviewed to evaluate the vascular anatomy.
CONTRAST:  75mL 1FOI35-QO5 IOPAMIDOL (1FOI35-QO5) INJECTION 76%

[Series 10: cta cap 2.00 bv36 s3 axial arterial · axial · arterial · 0.56mm/px · z∈[+1282,+1814]mm · 11 of 320 slices shown]
[im 27/320  lung]
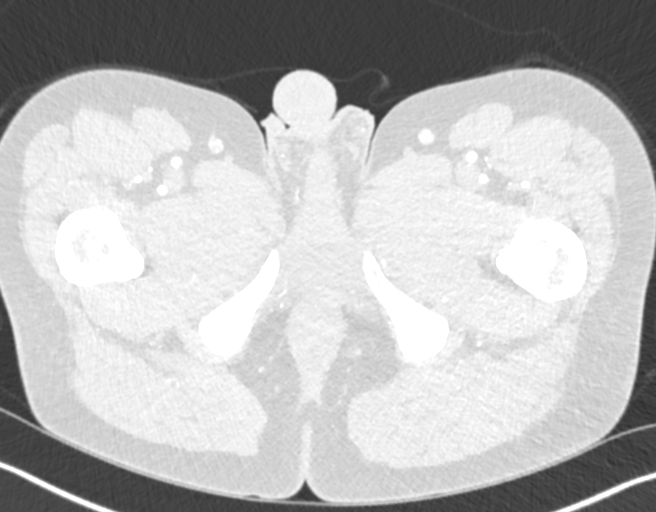
[im 54/320  mediastinal]
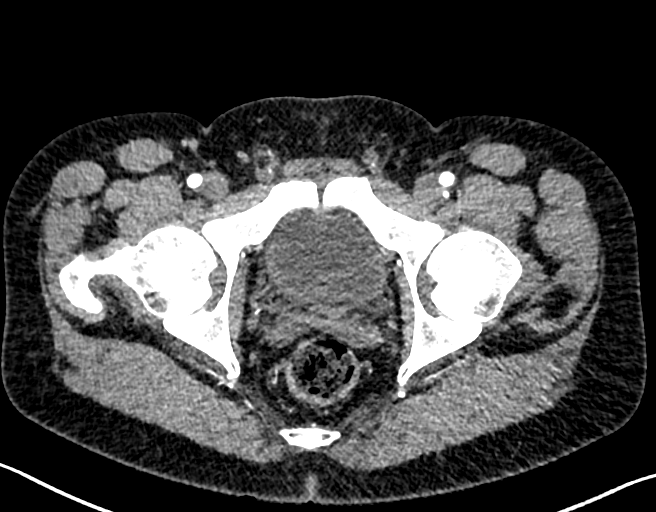
[im 80/320  lung]
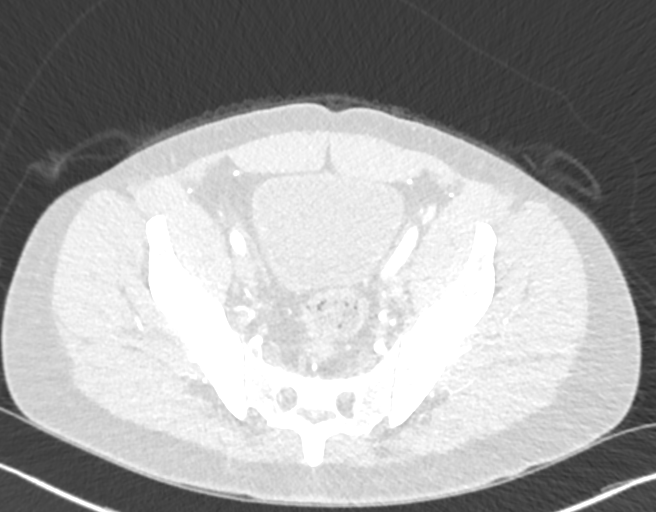
[im 107/320  mediastinal]
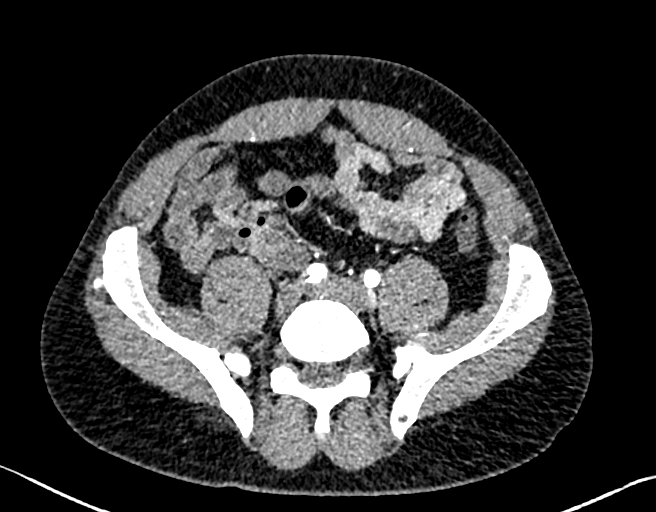
[im 133/320  lung]
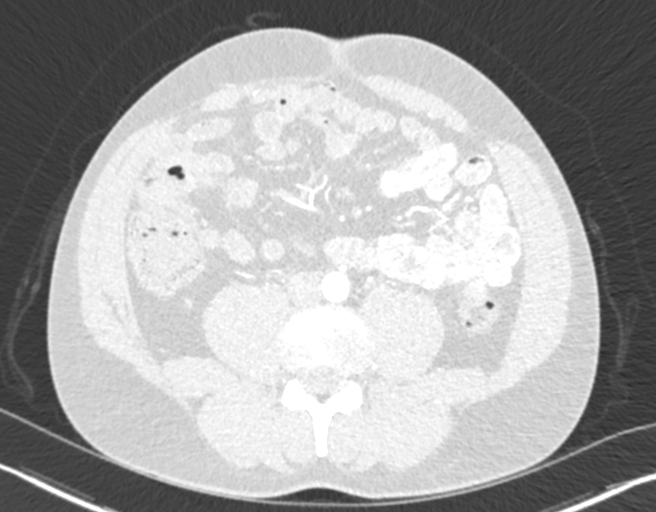
[im 160/320  mediastinal]
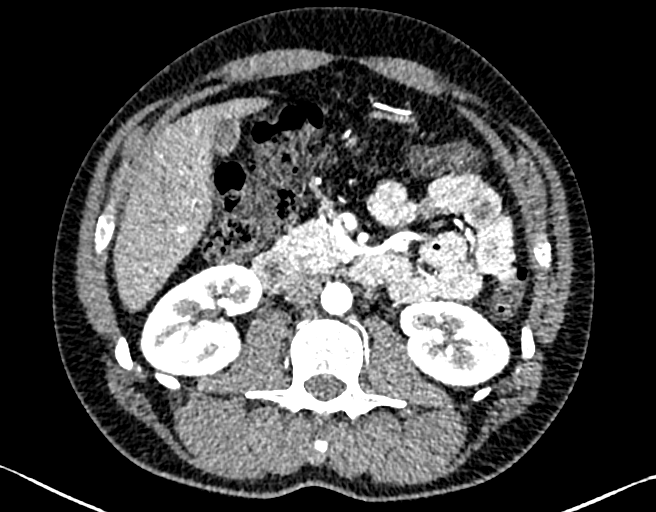
[im 187/320  lung]
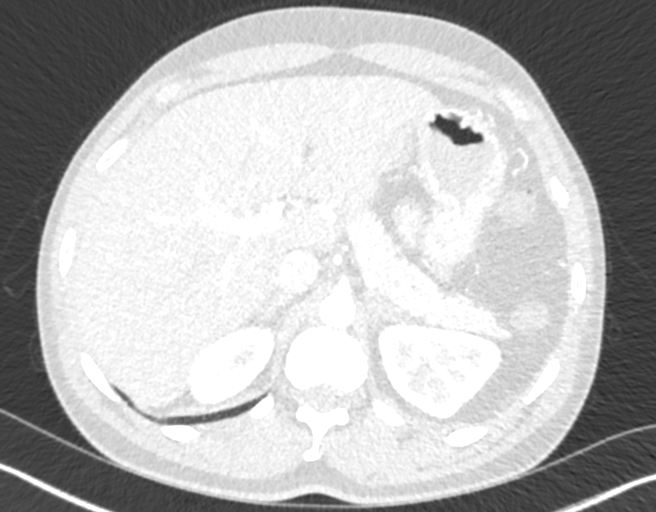
[im 213/320  mediastinal]
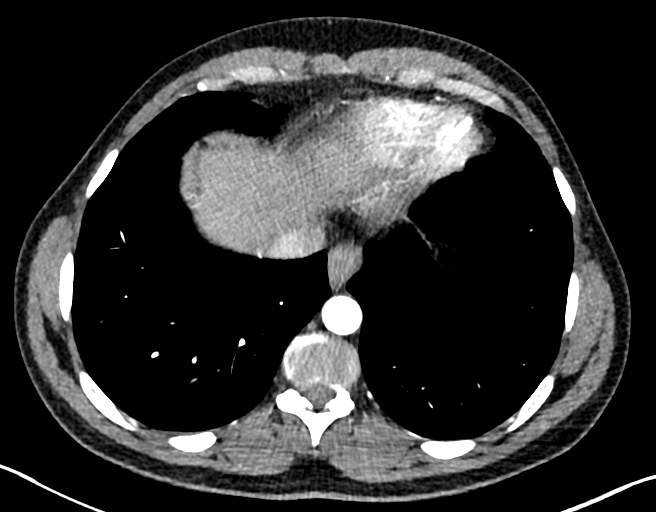
[im 240/320  lung]
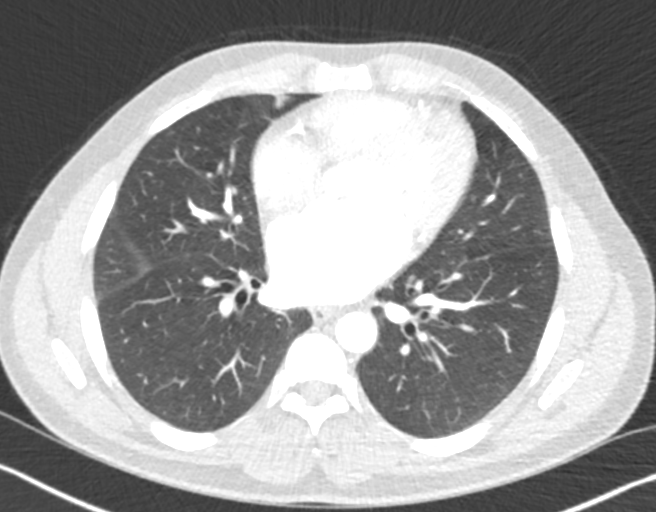
[im 266/320  mediastinal]
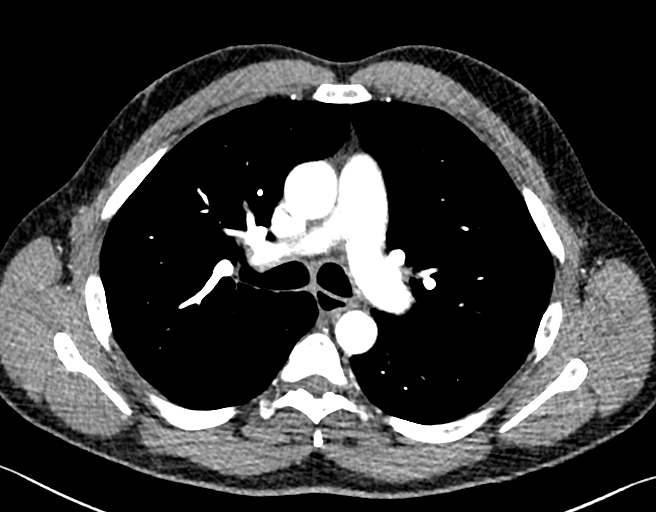
[im 293/320  lung]
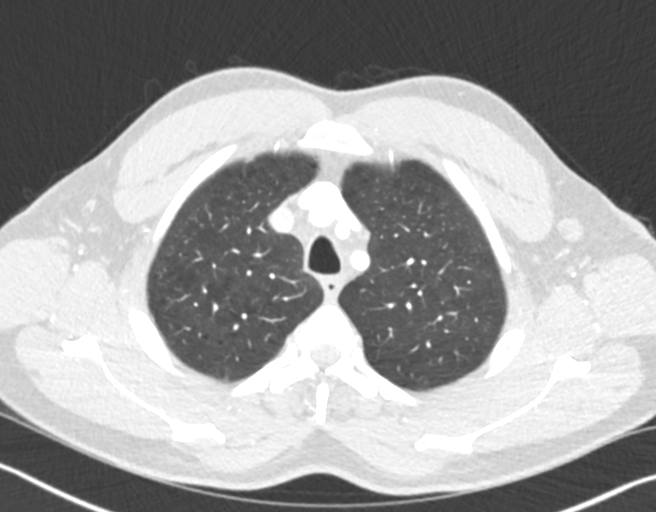

[Series 13: cta cap 2.00 bv36 s3 cor st · coronal · 0.71mm/px · 1 of 141 slices shown]
[im 71/141  mediastinal]
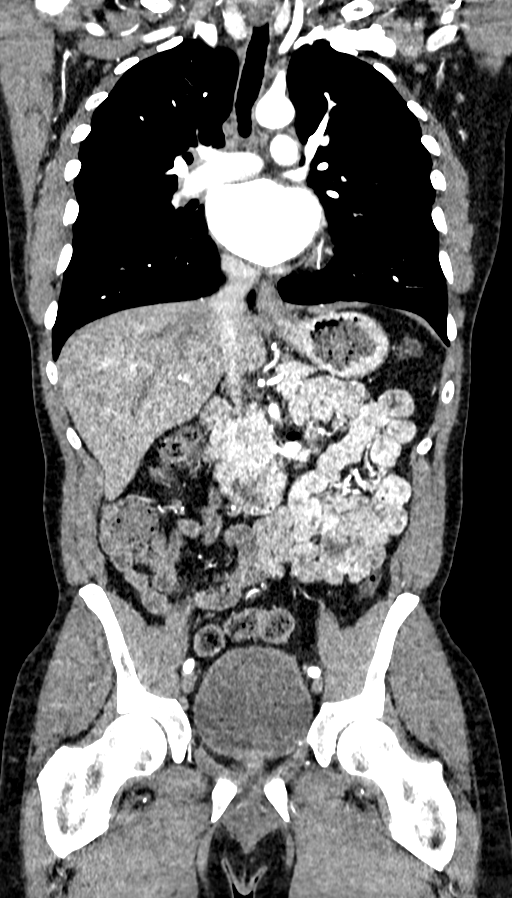

[Series 21: cta cap 2.00 br60 s3 lung arterial · axial · arterial · 0.55mm/px · 1 of 151 slices shown]
[im 31/151  mediastinal]
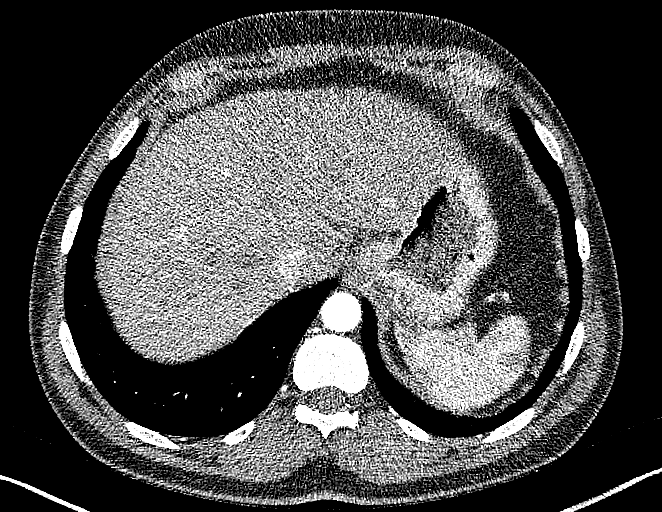

[13 of 36 positions shown; findings below may reference images not displayed]

FINDINGS: CTA CHEST FINDINGS

Cardiovascular: Heart size normal. No pericardial effusion.
Incomplete opacification of the pulmonary arterial tree; the exam
was not optimized for detection of pulmonary emboli. Left atrial
enlargement. Coronary calcifications. Good contrast opacification of
the thoracic aorta; no aneurysm, dissection, or stenosis. Classic 3
vessel brachiocephalic arterial origin anatomy without proximal
stenosis. No significant atheromatous change.

Mediastinum/Nodes: No hilar or mediastinal adenopathy.

Lungs/Pleura: No pleural effusion. No pneumothorax. 3 nodules
measuring 3-6 mm, abutting the minor fissure, possibly
intrapulmonary lymph nodes but nonspecific. emphysematous changes in
the upper lobes. Lungs otherwise clear.

Musculoskeletal: No chest wall abnormality. No acute or significant
osseous findings.

Review of the MIP images confirms the above findings.

CTA ABDOMEN AND PELVIS FINDINGS

VASCULAR

Aorta: Normal caliber aorta without aneurysm, dissection, vasculitis
or significant stenosis. Minimal atheromatous calcified plaque in
the infrarenal segment.

Celiac: Patent without evidence of aneurysm, dissection, vasculitis
or significant stenosis.

SMA: Patent without evidence of aneurysm, dissection, vasculitis or
significant stenosis.

Renals: Duplicated left, inferior dominant, both patent.

Duplicated right, inferior dominant, both patent.

IMA: Patent without evidence of aneurysm, dissection, vasculitis or
significant stenosis.

Inflow: Minimal calcified atheromatous plaque in bilateral common
and internal iliac arteries. No significant stenosis, aneurysm, or
dissection. External iliac arteries widely patent. Visualized
proximal lower extremity arterial outflow minimally atheromatous,
patent.

Veins: No obvious venous abnormality within the limitations of this
arterial phase study.

Review of the MIP images confirms the above findings.

NON-VASCULAR

Hepatobiliary: No focal liver abnormality is seen. No gallstones,
gallbladder wall thickening, or biliary dilatation.

Pancreas: Unremarkable. No pancreatic ductal dilatation or
surrounding inflammatory changes.

Spleen: Normal in size without focal abnormality.

Adrenals/Urinary Tract: Adrenal glands unremarkable. Kidneys enhance
normally, without hydronephrosis. Urinary bladder physiologically
distended.

Stomach/Bowel: Stomach incompletely distended. Small bowel is
decompressed. Normal appendix. The colon is nondilated,
unremarkable.

Lymphatic: No abdominal or pelvic adenopathy.

Reproductive: Prostate is unremarkable.

Other: No ascites. No free air.

Musculoskeletal: No acute or significant osseous findings.

Review of the MIP images confirms the above findings.
IMPRESSION: 1. Minimal aortoiliac atherosclerosis (07ILA-170.0), without
dissection or other acute findings.
2. Coronary calcifications. The severity of this disease and any
potential stenosis cannot be assessed on this non-gated CT
examination.
3. Small right lung nodules. No follow-up needed if patient is
low-risk (and has no known or suspected primary neoplasm).
Non-contrast chest CT can be considered in 12 months if patient is
high-risk. This recommendation follows the consensus statement:
Guidelines for Management of Incidental Pulmonary Nodules Detected

## 2021-03-11 ENCOUNTER — Ambulatory Visit: Payer: No Typology Code available for payment source | Admitting: Cardiovascular Disease

## 2021-05-07 IMAGING — DX DG CHEST 1V PORT
1 series · 1 of 1 positions shown · non-contrast
Comparison: March 02, 2020.

CLINICAL DATA: Atelectasis.

EXAM:
PORTABLE CHEST 1 VIEW

[chest ap]
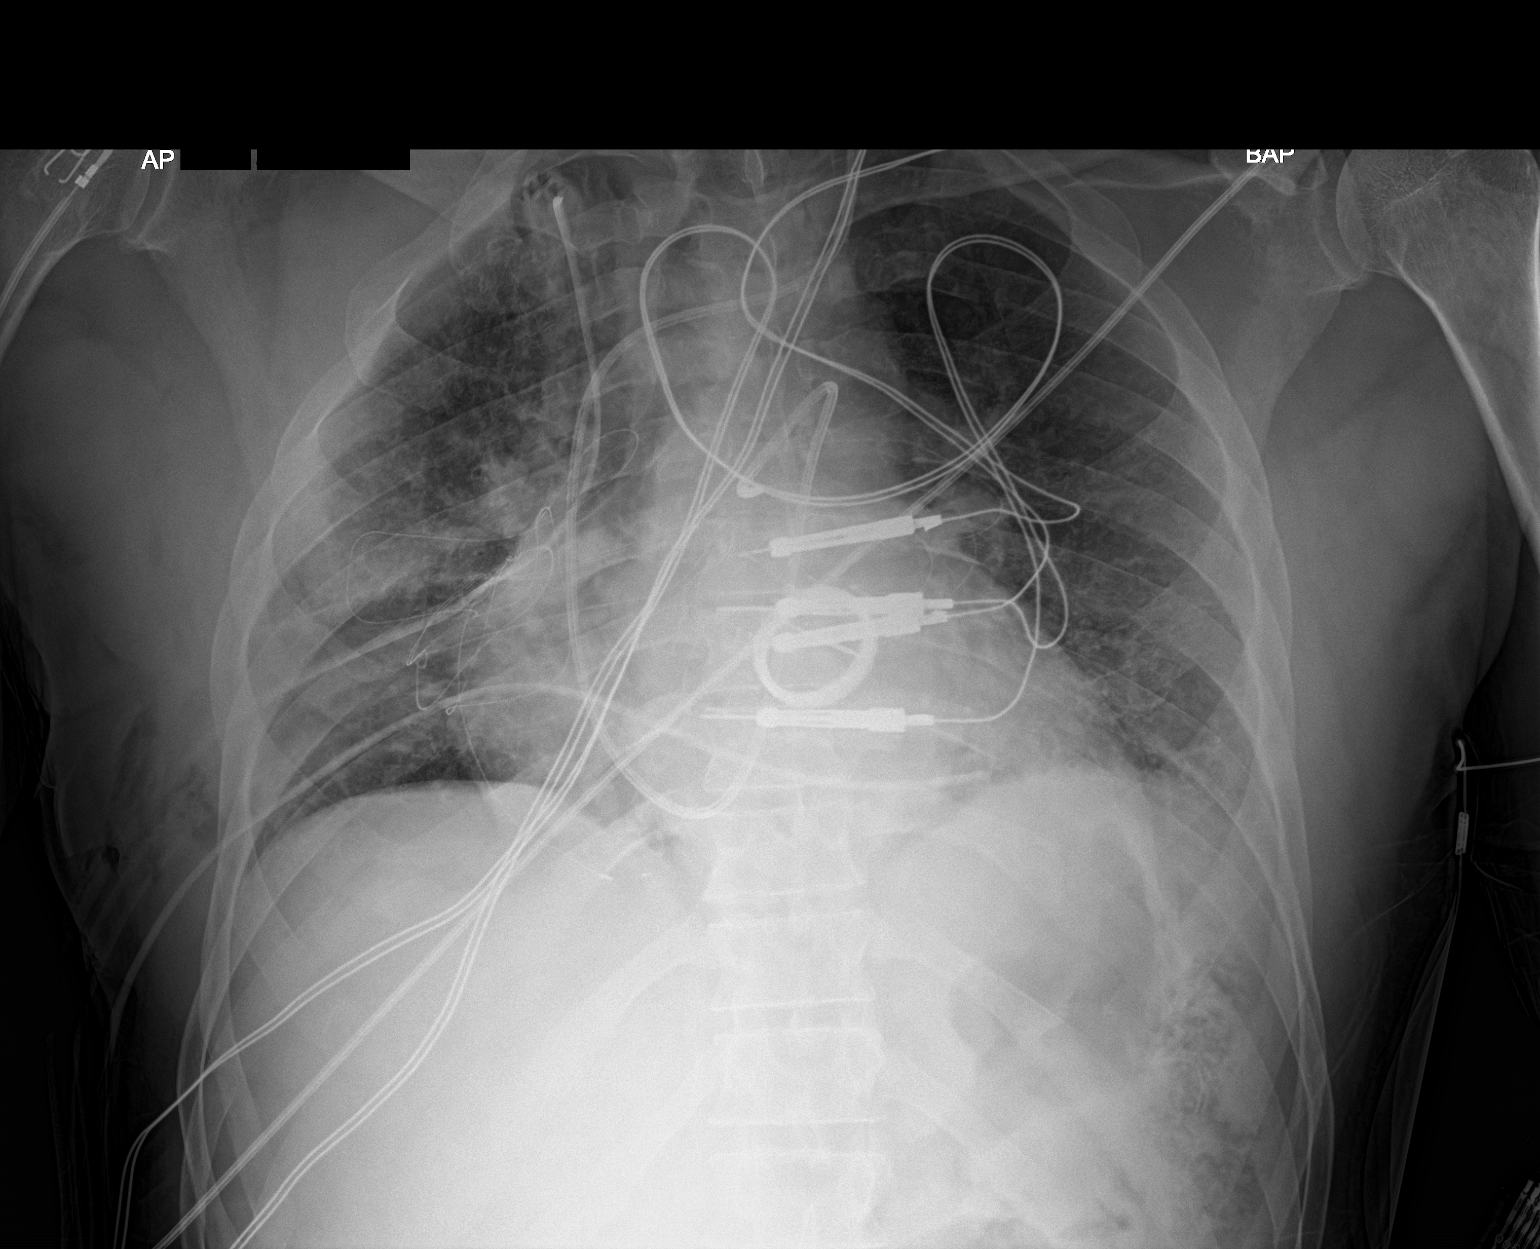

[1 of 1 positions shown; findings below may reference images not displayed]

FINDINGS: Stable cardiomegaly. Status post cardiac valve repair. Left internal
jugular Swan-Ganz catheter is noted with tip directed toward right
pulmonary artery. Right-sided chest tube is noted without
pneumothorax. Minimal left basilar subsegmental atelectasis. Mild
right upper lobe atelectasis is noted. Bony thorax is unremarkable.
IMPRESSION: Status post cardiac valve repair. Right-sided chest tube is noted
without pneumothorax. Minimal left basilar subsegmental atelectasis.
Mild right upper lobe atelectasis is noted.

## 2021-05-09 IMAGING — DX DG CHEST 1V PORT
1 series · 1 of 1 positions shown · non-contrast
Comparison: 03/07/2020

CLINICAL DATA: Postop from mitral valve replacement.

EXAM:
PORTABLE CHEST 1 VIEW

[chest ap]
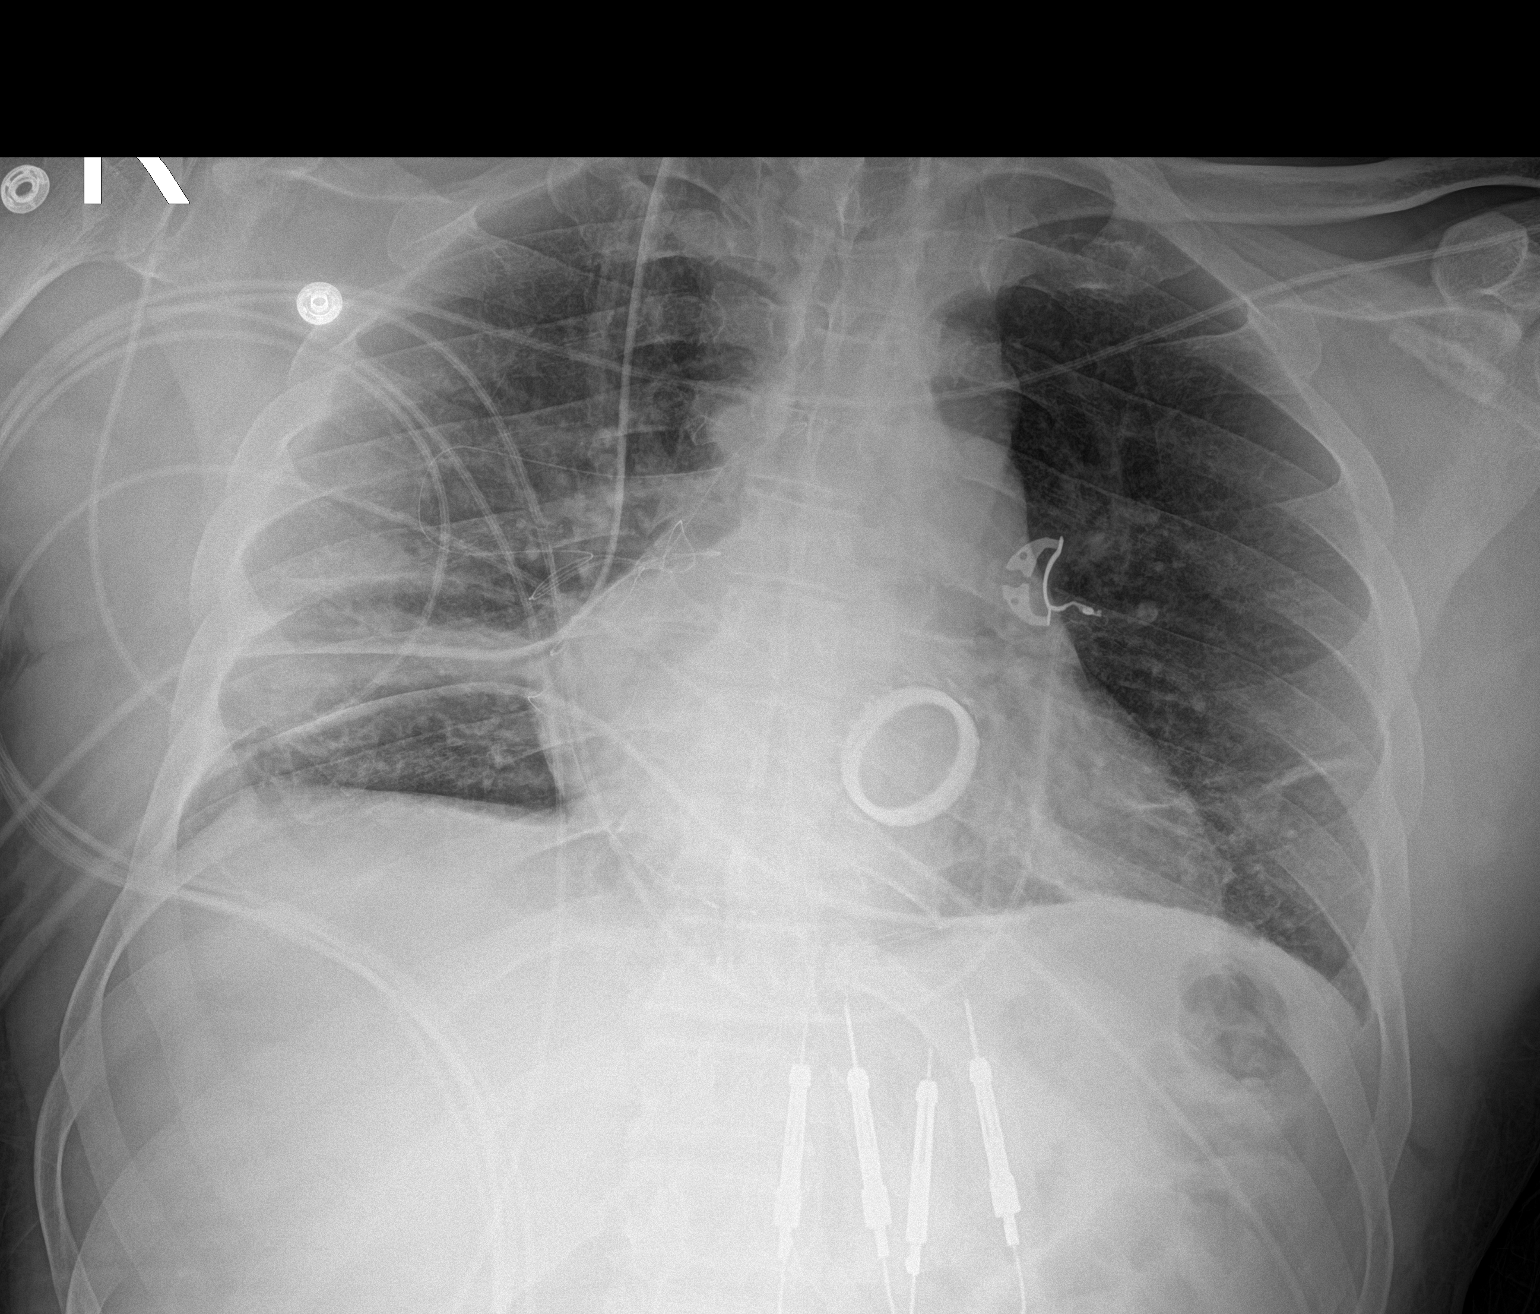

[1 of 1 positions shown; findings below may reference images not displayed]

FINDINGS: Heart size is within normal limits. Prosthetic mitral valve is noted
as well as epicardial pacer wires.

Right chest tube remains in place, and no pneumothorax is
visualized. Mild atelectasis is seen in both lung bases, with
improvement on the left. No new or worsening areas of pulmonary
opacity are seen.
IMPRESSION: Bibasilar atelectasis, with improvement on the left. No pneumothorax
visualized.

## 2022-03-27 IMAGING — DX DG ABD PORTABLE 1V
1 series · 1 of 1 positions shown · non-contrast
Comparison: Chest x-ray 01/24/2021 at [DATE] p.m. and at 3 o'clock
p.m.

CLINICAL DATA: OG tube placement.

EXAM:
PORTABLE ABDOMEN - 1 VIEW

[abdomen kub]
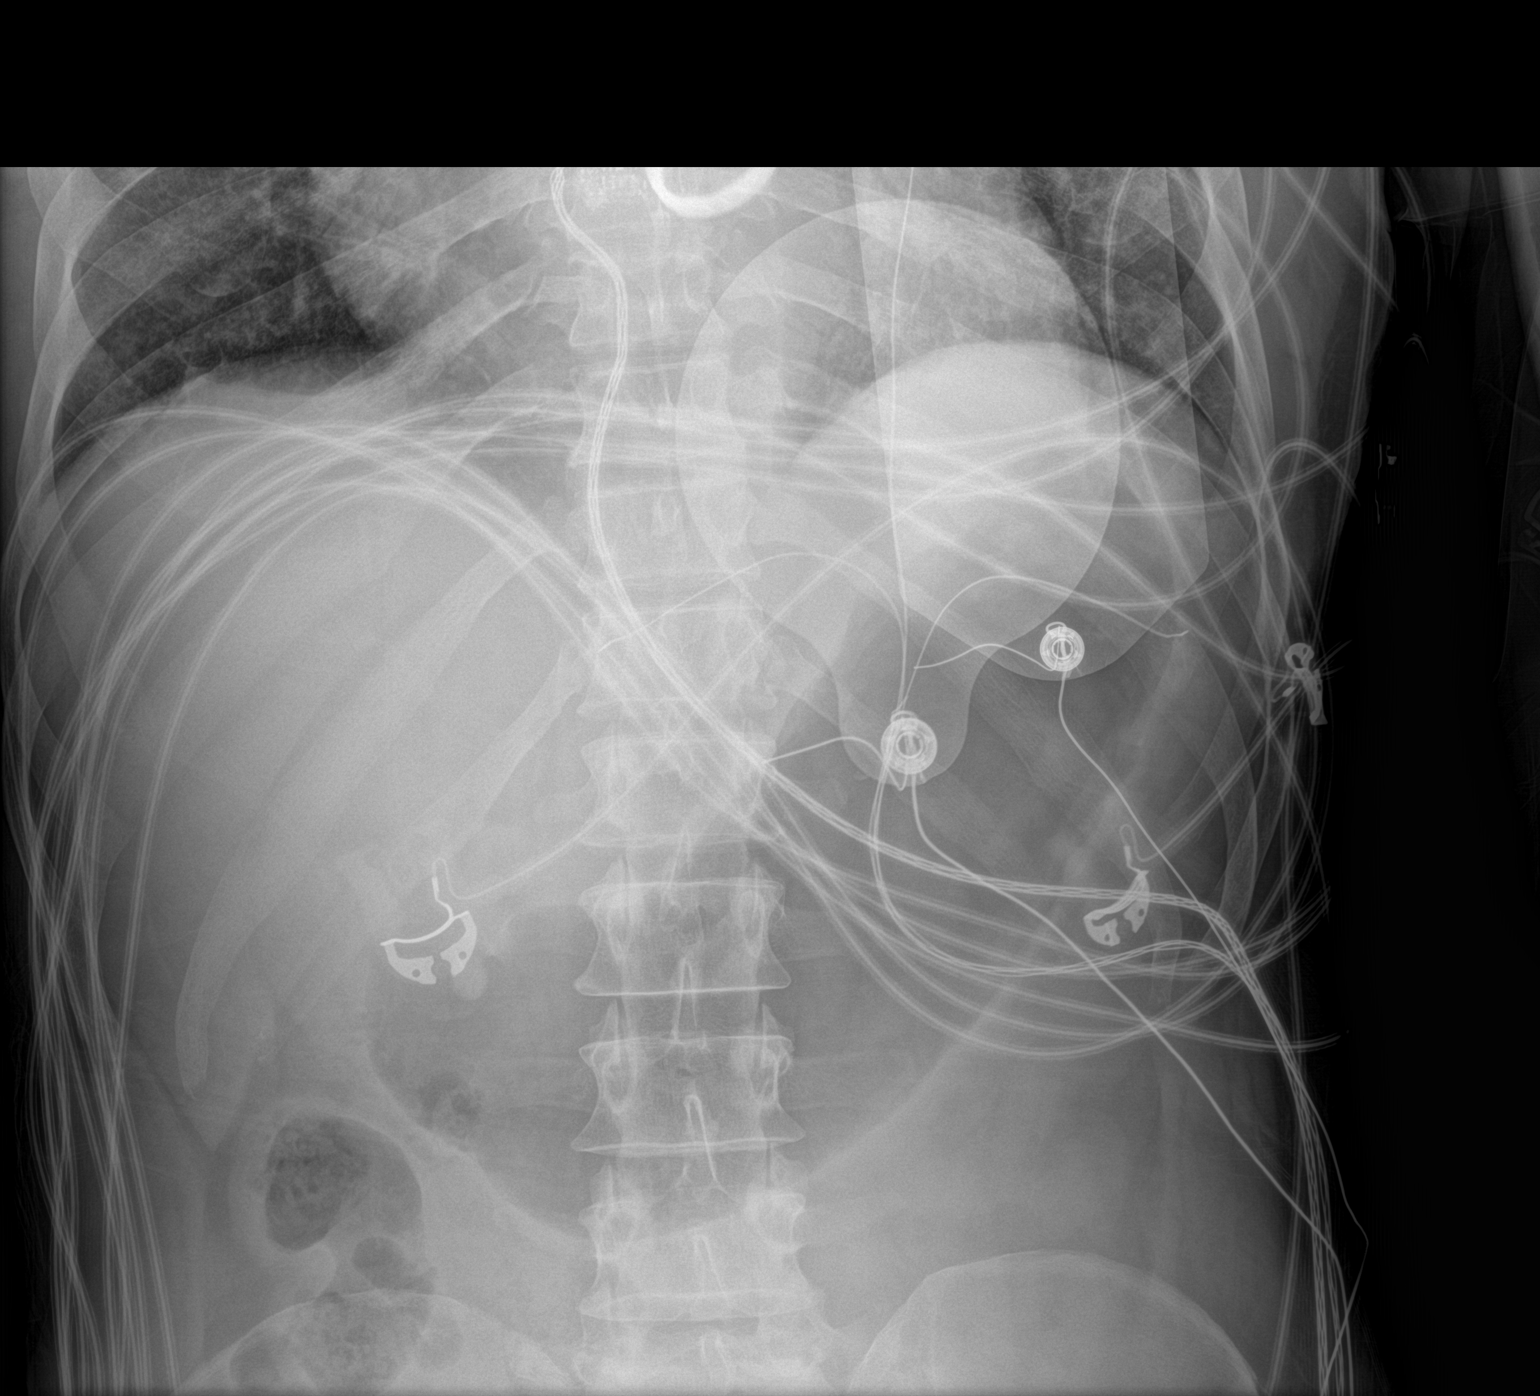

[1 of 1 positions shown; findings below may reference images not displayed]

FINDINGS: There is gaseous distension of the stomach. Despite history of
orogastric tube placement, no tube is identified. Bowel gas pattern
is nonobstructive. No evidence for free intraperitoneal air on this
supine view.
IMPRESSION: 1. No tube identified.
2. Gaseous distension of the stomach.
